# Patient Record
Sex: Male | Born: 1950 | Race: White | Hispanic: No | Marital: Single | Smoking: Never smoker
Health system: Southern US, Community
[De-identification: ages and names within clinical notes are randomized; demographics above are authoritative.]

## PROBLEM LIST (undated history)

## (undated) DIAGNOSIS — E785 Hyperlipidemia, unspecified: Secondary | ICD-10-CM

## (undated) DIAGNOSIS — Z8489 Family history of other specified conditions: Secondary | ICD-10-CM

## (undated) DIAGNOSIS — K5790 Diverticulosis of intestine, part unspecified, without perforation or abscess without bleeding: Secondary | ICD-10-CM

## (undated) DIAGNOSIS — I10 Essential (primary) hypertension: Secondary | ICD-10-CM

## (undated) DIAGNOSIS — D649 Anemia, unspecified: Secondary | ICD-10-CM

## (undated) DIAGNOSIS — E876 Hypokalemia: Secondary | ICD-10-CM

## (undated) DIAGNOSIS — K644 Residual hemorrhoidal skin tags: Secondary | ICD-10-CM

## (undated) DIAGNOSIS — D126 Benign neoplasm of colon, unspecified: Secondary | ICD-10-CM

## (undated) DIAGNOSIS — H53009 Unspecified amblyopia, unspecified eye: Secondary | ICD-10-CM

## (undated) DIAGNOSIS — M1990 Unspecified osteoarthritis, unspecified site: Secondary | ICD-10-CM

## (undated) DIAGNOSIS — I2510 Atherosclerotic heart disease of native coronary artery without angina pectoris: Secondary | ICD-10-CM

## (undated) DIAGNOSIS — R7989 Other specified abnormal findings of blood chemistry: Secondary | ICD-10-CM

## (undated) DIAGNOSIS — K449 Diaphragmatic hernia without obstruction or gangrene: Secondary | ICD-10-CM

## (undated) DIAGNOSIS — E119 Type 2 diabetes mellitus without complications: Secondary | ICD-10-CM

## (undated) DIAGNOSIS — K222 Esophageal obstruction: Secondary | ICD-10-CM

## (undated) DIAGNOSIS — K219 Gastro-esophageal reflux disease without esophagitis: Secondary | ICD-10-CM

## (undated) DIAGNOSIS — R945 Abnormal results of liver function studies: Secondary | ICD-10-CM

## (undated) HISTORY — DX: Diaphragmatic hernia without obstruction or gangrene: K44.9

## (undated) HISTORY — DX: Hypokalemia: E87.6

## (undated) HISTORY — DX: Gastro-esophageal reflux disease without esophagitis: K21.9

## (undated) HISTORY — DX: Type 2 diabetes mellitus without complications: E11.9

## (undated) HISTORY — DX: Other specified abnormal findings of blood chemistry: R79.89

## (undated) HISTORY — DX: Residual hemorrhoidal skin tags: K64.4

## (undated) HISTORY — DX: Hyperlipidemia, unspecified: E78.5

## (undated) HISTORY — DX: Esophageal obstruction: K22.2

## (undated) HISTORY — DX: Abnormal results of liver function studies: R94.5

## (undated) HISTORY — DX: Essential (primary) hypertension: I10

## (undated) HISTORY — PX: EXCISIONAL HEMORRHOIDECTOMY: SHX1541

## (undated) HISTORY — PX: CARDIAC CATHETERIZATION: SHX172

## (undated) HISTORY — PX: BACK SURGERY: SHX140

## (undated) HISTORY — DX: Atherosclerotic heart disease of native coronary artery without angina pectoris: I25.10

## (undated) HISTORY — DX: Unspecified amblyopia, unspecified eye: H53.009

## (undated) HISTORY — DX: Diverticulosis of intestine, part unspecified, without perforation or abscess without bleeding: K57.90

## (undated) HISTORY — DX: Benign neoplasm of colon, unspecified: D12.6

---

## 2000-01-04 DIAGNOSIS — K449 Diaphragmatic hernia without obstruction or gangrene: Secondary | ICD-10-CM

## 2000-01-04 HISTORY — DX: Diaphragmatic hernia without obstruction or gangrene: K44.9

## 2000-01-18 ENCOUNTER — Ambulatory Visit (HOSPITAL_COMMUNITY): Admission: RE | Admit: 2000-01-18 | Discharge: 2000-01-18 | Payer: Self-pay | Admitting: Gastroenterology

## 2000-01-18 ENCOUNTER — Encounter: Payer: Self-pay | Admitting: Gastroenterology

## 2001-06-27 DIAGNOSIS — K222 Esophageal obstruction: Secondary | ICD-10-CM

## 2001-06-27 HISTORY — PX: ESOPHAGEAL DILATION: SHX303

## 2001-06-27 HISTORY — PX: ESOPHAGOGASTRODUODENOSCOPY: SHX1529

## 2001-06-27 HISTORY — DX: Esophageal obstruction: K22.2

## 2002-07-11 ENCOUNTER — Encounter: Payer: Self-pay | Admitting: Internal Medicine

## 2002-07-11 ENCOUNTER — Encounter: Admission: RE | Admit: 2002-07-11 | Discharge: 2002-07-11 | Payer: Self-pay | Admitting: Internal Medicine

## 2004-06-27 HISTORY — PX: COLONOSCOPY: SHX174

## 2004-07-21 ENCOUNTER — Ambulatory Visit: Payer: Self-pay | Admitting: Internal Medicine

## 2004-08-10 DIAGNOSIS — D126 Benign neoplasm of colon, unspecified: Secondary | ICD-10-CM

## 2004-08-10 HISTORY — DX: Benign neoplasm of colon, unspecified: D12.6

## 2004-08-27 ENCOUNTER — Ambulatory Visit: Payer: Self-pay | Admitting: Gastroenterology

## 2004-09-07 ENCOUNTER — Ambulatory Visit: Payer: Self-pay | Admitting: Gastroenterology

## 2005-09-16 ENCOUNTER — Ambulatory Visit: Payer: Self-pay | Admitting: Internal Medicine

## 2005-09-23 ENCOUNTER — Ambulatory Visit: Payer: Self-pay | Admitting: Internal Medicine

## 2005-10-07 ENCOUNTER — Ambulatory Visit: Payer: Self-pay | Admitting: Internal Medicine

## 2005-10-13 ENCOUNTER — Ambulatory Visit (HOSPITAL_COMMUNITY): Admission: RE | Admit: 2005-10-13 | Discharge: 2005-10-13 | Payer: Self-pay | Admitting: Cardiovascular Disease

## 2005-10-19 ENCOUNTER — Ambulatory Visit: Payer: Self-pay | Admitting: Internal Medicine

## 2005-11-25 ENCOUNTER — Ambulatory Visit: Payer: Self-pay | Admitting: Internal Medicine

## 2006-04-11 ENCOUNTER — Ambulatory Visit: Payer: Self-pay | Admitting: Internal Medicine

## 2006-04-11 LAB — CONVERTED CEMR LAB
Chol/HDL Ratio, serum: 4.2
Cholesterol: 142 mg/dL (ref 0–200)
HDL: 34 mg/dL — ABNORMAL LOW (ref >39.0)
Hgb A1c MFr Bld: 6 % (ref 4.6–6.0)
LDL Cholesterol: 92 mg/dL (ref 0–99)
Triglyceride fasting, serum: 82 mg/dL (ref 0–149)
VLDL: 16 mg/dL (ref 0–40)

## 2006-04-18 ENCOUNTER — Ambulatory Visit: Payer: Self-pay | Admitting: Internal Medicine

## 2006-05-02 ENCOUNTER — Ambulatory Visit: Payer: Self-pay | Admitting: Internal Medicine

## 2006-07-11 ENCOUNTER — Ambulatory Visit: Payer: Self-pay | Admitting: Internal Medicine

## 2006-09-12 ENCOUNTER — Ambulatory Visit: Payer: Self-pay | Admitting: Internal Medicine

## 2006-09-12 LAB — CONVERTED CEMR LAB
ALT: 24 units/L (ref 0–40)
AST: 18 units/L (ref 0–37)
Albumin: 3.8 g/dL (ref 3.5–5.2)
Alkaline Phosphatase: 60 units/L (ref 39–117)
BUN: 13 mg/dL (ref 6–23)
Basophils Absolute: 0.1 10*3/uL (ref 0.0–0.1)
Basophils Relative: 0.7 % (ref 0.0–1.0)
Bilirubin Urine: NEGATIVE
Bilirubin, Direct: 0.1 mg/dL (ref 0.0–0.3)
CO2: 28 meq/L (ref 19–32)
Calcium: 9.2 mg/dL (ref 8.4–10.5)
Chloride: 106 meq/L (ref 96–112)
Cholesterol: 175 mg/dL (ref 0–200)
Creatinine, Ser: 1 mg/dL (ref 0.4–1.5)
Creatinine,U: 212.6 mg/dL
Eosinophils Absolute: 0.5 10*3/uL (ref 0.0–0.6)
Eosinophils Relative: 6.3 % — ABNORMAL HIGH (ref 0.0–5.0)
GFR calc Af Amer: 99 mL/min
GFR calc non Af Amer: 82 mL/min
Glucose, Bld: 124 mg/dL — ABNORMAL HIGH (ref 70–99)
HCT: 42.4 % (ref 39.0–52.0)
HDL: 40.8 mg/dL (ref >39.0)
Hemoglobin, Urine: NEGATIVE
Hemoglobin: 14.7 g/dL (ref 13.0–17.0)
Hgb A1c MFr Bld: 6.2 % — ABNORMAL HIGH (ref 4.6–6.0)
Ketones, ur: NEGATIVE mg/dL
LDL Cholesterol: 111 mg/dL — ABNORMAL HIGH (ref 0–99)
Leukocytes, UA: NEGATIVE
Lymphocytes Relative: 23.8 % (ref 12.0–46.0)
MCHC: 34.7 g/dL (ref 30.0–36.0)
MCV: 96.3 fL (ref 78.0–100.0)
Microalb Creat Ratio: 3.8 mg/g (ref 0.0–30.0)
Microalb, Ur: 0.8 mg/dL (ref 0.0–1.9)
Monocytes Absolute: 0.5 10*3/uL (ref 0.2–0.7)
Monocytes Relative: 7.1 % (ref 3.0–11.0)
Neutro Abs: 4.5 10*3/uL (ref 1.4–7.7)
Neutrophils Relative %: 62.1 % (ref 43.0–77.0)
Nitrite: NEGATIVE
PSA: 1.1 ng/mL (ref 0.10–4.00)
Platelets: 249 10*3/uL (ref 150–400)
Potassium: 3.8 meq/L (ref 3.5–5.1)
RBC: 4.4 M/uL (ref 4.22–5.81)
RDW: 12.2 % (ref 11.5–14.6)
Sodium: 139 meq/L (ref 135–145)
Specific Gravity, Urine: >=1.03 (ref 1.000–1.03)
TSH: 1.2 microintl units/mL (ref 0.35–5.50)
Total Bilirubin: 0.7 mg/dL (ref 0.3–1.2)
Total CHOL/HDL Ratio: 4.3
Total Protein, Urine: NEGATIVE mg/dL
Total Protein: 6.8 g/dL (ref 6.0–8.3)
Triglycerides: 115 mg/dL (ref 0–149)
Urine Glucose: NEGATIVE mg/dL
Urobilinogen, UA: 0.2 (ref 0.0–1.0)
VLDL: 23 mg/dL (ref 0–40)
WBC: 7.4 10*3/uL (ref 4.5–10.5)
pH: 6 (ref 5.0–8.0)

## 2006-09-20 ENCOUNTER — Ambulatory Visit: Payer: Self-pay | Admitting: Internal Medicine

## 2006-12-06 DIAGNOSIS — K219 Gastro-esophageal reflux disease without esophagitis: Secondary | ICD-10-CM | POA: Insufficient documentation

## 2006-12-06 DIAGNOSIS — H53009 Unspecified amblyopia, unspecified eye: Secondary | ICD-10-CM | POA: Insufficient documentation

## 2007-01-09 ENCOUNTER — Ambulatory Visit: Payer: Self-pay | Admitting: Internal Medicine

## 2007-01-18 ENCOUNTER — Encounter (INDEPENDENT_AMBULATORY_CARE_PROVIDER_SITE_OTHER): Payer: Self-pay | Admitting: *Deleted

## 2007-01-18 LAB — CONVERTED CEMR LAB
ALT: 23 units/L (ref 0–53)
AST: 20 units/L (ref 0–37)
Cholesterol: 152 mg/dL (ref 0–200)
Creatinine,U: 142.9 mg/dL
HDL: 35.5 mg/dL — ABNORMAL LOW (ref >39.0)
Hgb A1c MFr Bld: 6.2 % — ABNORMAL HIGH (ref 4.6–6.0)
LDL Cholesterol: 91 mg/dL (ref 0–99)
Microalb Creat Ratio: 4.2 mg/g (ref 0.0–30.0)
Microalb, Ur: 0.6 mg/dL (ref 0.0–1.9)
Total CHOL/HDL Ratio: 4.3
Triglycerides: 127 mg/dL (ref 0–149)
VLDL: 25 mg/dL (ref 0–40)

## 2007-01-23 ENCOUNTER — Ambulatory Visit: Payer: Self-pay | Admitting: Internal Medicine

## 2007-02-05 ENCOUNTER — Ambulatory Visit: Payer: Self-pay | Admitting: Internal Medicine

## 2007-02-05 LAB — CONVERTED CEMR LAB
BUN: 18 mg/dL (ref 6–23)
Calcium: 8.7 mg/dL (ref 8.4–10.5)
Chloride: 102 meq/L (ref 96–112)
GFR calc non Af Amer: 73 mL/min
Sodium: 138 meq/L (ref 135–145)

## 2007-02-27 ENCOUNTER — Ambulatory Visit: Payer: Self-pay | Admitting: Internal Medicine

## 2007-02-27 DIAGNOSIS — E1159 Type 2 diabetes mellitus with other circulatory complications: Secondary | ICD-10-CM | POA: Insufficient documentation

## 2007-02-27 DIAGNOSIS — E119 Type 2 diabetes mellitus without complications: Secondary | ICD-10-CM | POA: Insufficient documentation

## 2007-02-27 DIAGNOSIS — I152 Hypertension secondary to endocrine disorders: Secondary | ICD-10-CM | POA: Insufficient documentation

## 2007-02-27 DIAGNOSIS — E876 Hypokalemia: Secondary | ICD-10-CM | POA: Insufficient documentation

## 2007-02-27 DIAGNOSIS — I10 Essential (primary) hypertension: Secondary | ICD-10-CM

## 2007-06-01 ENCOUNTER — Ambulatory Visit: Payer: Self-pay | Admitting: Internal Medicine

## 2007-06-04 LAB — CONVERTED CEMR LAB
Creatinine, Ser: 0.9 mg/dL (ref 0.4–1.5)
Hgb A1c MFr Bld: 6.1 % — ABNORMAL HIGH (ref 4.6–6.0)

## 2007-06-08 ENCOUNTER — Ambulatory Visit: Payer: Self-pay | Admitting: Internal Medicine

## 2007-06-08 DIAGNOSIS — E785 Hyperlipidemia, unspecified: Secondary | ICD-10-CM

## 2007-06-08 DIAGNOSIS — E1169 Type 2 diabetes mellitus with other specified complication: Secondary | ICD-10-CM | POA: Insufficient documentation

## 2007-12-05 ENCOUNTER — Ambulatory Visit: Payer: Self-pay | Admitting: Internal Medicine

## 2007-12-10 LAB — CONVERTED CEMR LAB
Alkaline Phosphatase: 63 units/L (ref 39–117)
BUN: 19 mg/dL (ref 6–23)
Bilirubin, Direct: 0.1 mg/dL (ref 0.0–0.3)
Cholesterol: 135 mg/dL (ref 0–200)
Creatinine, Ser: 1 mg/dL (ref 0.4–1.5)
LDL Cholesterol: 88 mg/dL (ref 0–99)
Microalb Creat Ratio: 3.1 mg/g (ref 0.0–30.0)
Total Protein: 7.1 g/dL (ref 6.0–8.3)

## 2007-12-13 ENCOUNTER — Ambulatory Visit: Payer: Self-pay | Admitting: Internal Medicine

## 2007-12-13 DIAGNOSIS — M545 Low back pain, unspecified: Secondary | ICD-10-CM | POA: Insufficient documentation

## 2008-02-08 ENCOUNTER — Telehealth (INDEPENDENT_AMBULATORY_CARE_PROVIDER_SITE_OTHER): Payer: Self-pay | Admitting: *Deleted

## 2008-06-17 ENCOUNTER — Ambulatory Visit: Payer: Self-pay | Admitting: Internal Medicine

## 2008-08-27 ENCOUNTER — Telehealth (INDEPENDENT_AMBULATORY_CARE_PROVIDER_SITE_OTHER): Payer: Self-pay | Admitting: *Deleted

## 2008-10-31 ENCOUNTER — Encounter: Payer: Self-pay | Admitting: Internal Medicine

## 2008-11-28 ENCOUNTER — Telehealth (INDEPENDENT_AMBULATORY_CARE_PROVIDER_SITE_OTHER): Payer: Self-pay | Admitting: *Deleted

## 2008-12-18 ENCOUNTER — Ambulatory Visit: Payer: Self-pay | Admitting: Internal Medicine

## 2008-12-31 ENCOUNTER — Encounter (INDEPENDENT_AMBULATORY_CARE_PROVIDER_SITE_OTHER): Payer: Self-pay | Admitting: *Deleted

## 2009-01-16 ENCOUNTER — Encounter: Payer: Self-pay | Admitting: Internal Medicine

## 2009-03-27 ENCOUNTER — Telehealth (INDEPENDENT_AMBULATORY_CARE_PROVIDER_SITE_OTHER): Payer: Self-pay | Admitting: *Deleted

## 2009-04-03 ENCOUNTER — Telehealth (INDEPENDENT_AMBULATORY_CARE_PROVIDER_SITE_OTHER): Payer: Self-pay | Admitting: *Deleted

## 2009-04-06 ENCOUNTER — Telehealth (INDEPENDENT_AMBULATORY_CARE_PROVIDER_SITE_OTHER): Payer: Self-pay | Admitting: *Deleted

## 2009-04-06 ENCOUNTER — Encounter: Payer: Self-pay | Admitting: Internal Medicine

## 2009-06-23 ENCOUNTER — Ambulatory Visit: Payer: Self-pay | Admitting: Internal Medicine

## 2009-06-24 ENCOUNTER — Encounter (INDEPENDENT_AMBULATORY_CARE_PROVIDER_SITE_OTHER): Payer: Self-pay | Admitting: *Deleted

## 2009-08-12 ENCOUNTER — Encounter (INDEPENDENT_AMBULATORY_CARE_PROVIDER_SITE_OTHER): Payer: Self-pay | Admitting: *Deleted

## 2009-11-25 DIAGNOSIS — I2510 Atherosclerotic heart disease of native coronary artery without angina pectoris: Secondary | ICD-10-CM | POA: Insufficient documentation

## 2009-11-26 ENCOUNTER — Ambulatory Visit: Payer: Self-pay | Admitting: Internal Medicine

## 2009-12-25 HISTORY — PX: POSTERIOR LAMINECTOMY / DECOMPRESSION LUMBAR SPINE: SUR740

## 2010-01-05 ENCOUNTER — Telehealth: Payer: Self-pay | Admitting: Internal Medicine

## 2010-01-06 ENCOUNTER — Encounter: Payer: Self-pay | Admitting: Internal Medicine

## 2010-01-06 ENCOUNTER — Telehealth: Payer: Self-pay | Admitting: Internal Medicine

## 2010-01-08 ENCOUNTER — Encounter: Payer: Self-pay | Admitting: Internal Medicine

## 2010-01-20 ENCOUNTER — Observation Stay (HOSPITAL_COMMUNITY): Admission: RE | Admit: 2010-01-20 | Discharge: 2010-01-21 | Payer: Self-pay | Admitting: Neurosurgery

## 2010-02-15 ENCOUNTER — Encounter: Payer: Self-pay | Admitting: Internal Medicine

## 2010-04-01 ENCOUNTER — Telehealth (INDEPENDENT_AMBULATORY_CARE_PROVIDER_SITE_OTHER): Payer: Self-pay | Admitting: *Deleted

## 2010-04-26 ENCOUNTER — Encounter: Payer: Self-pay | Admitting: Internal Medicine

## 2010-06-27 DIAGNOSIS — K644 Residual hemorrhoidal skin tags: Secondary | ICD-10-CM

## 2010-06-27 HISTORY — PX: COLONOSCOPY: SHX174

## 2010-06-27 HISTORY — DX: Residual hemorrhoidal skin tags: K64.4

## 2010-07-25 LAB — CONVERTED CEMR LAB
Albumin: 4.4 g/dL (ref 3.5–5.2)
Basophils Absolute: 0 10*3/uL (ref 0.0–0.1)
CO2: 30 meq/L (ref 19–32)
Calcium: 9.8 mg/dL (ref 8.4–10.5)
Chloride: 102 meq/L (ref 96–112)
Creatinine,U: 118.1 mg/dL
Eosinophils Absolute: 0.2 10*3/uL (ref 0.0–0.7)
Glucose, Bld: 108 mg/dL — ABNORMAL HIGH (ref 70–99)
HCT: 45.5 % (ref 39.0–52.0)
HDL: 39.1 mg/dL (ref >39.00)
Hemoglobin: 15.7 g/dL (ref 13.0–17.0)
Lymphs Abs: 1.6 10*3/uL (ref 0.7–4.0)
MCHC: 34.5 g/dL (ref 30.0–36.0)
MCV: 98.2 fL (ref 78.0–100.0)
Microalb, Ur: 0.4 mg/dL (ref 0.0–1.9)
Monocytes Absolute: 0.5 10*3/uL (ref 0.1–1.0)
Neutro Abs: 5.8 10*3/uL (ref 1.4–7.7)
Potassium: 4.4 meq/L (ref 3.5–5.1)
RDW: 13 % (ref 11.5–14.6)
Sodium: 141 meq/L (ref 135–145)
TSH: 1.22 microintl units/mL (ref 0.35–5.50)
Total Protein: 7.5 g/dL (ref 6.0–8.3)

## 2010-07-27 NOTE — Letter (Signed)
Summary: Colonoscopy Letter  [facility]  [address] [place], [place] [address]   Phone: [phone]  Fax: [phone]      August 12, 2009 MRN: [id number]   Khan Degregory [address] [place], [place]  [address]   Dear Mr. Traister,   According to your medical record, it is time for you to schedule a Colonoscopy. The American Cancer Society recommends this procedure as a method to detect early colon cancer. Patients with a family history of colon cancer, or a personal history of colon polyps or inflammatory bowel disease are at increased risk.  This letter has been generated based on the recommendations made at the time of your procedure. If you feel that in your particular situation this may no longer apply, please contact our office.  Please call our office at [phone] to schedule this appointment or to update your records at your earliest convenience.  Thank you for cooperating with [place] to provide you with the very best care possible.   Sincerely,  Vania Rea. Jarold Motto, M.D.  [facility] Gastroenterology Division [phone]

## 2010-07-27 NOTE — Letter (Signed)
Summary: [facility]  [facility]   Imported By: Lanelle Bal 03/02/2010 10:38:01  _____________________________________________________________________  External Attachment:    Type:   Image     Comment:   External Document

## 2010-07-27 NOTE — Letter (Signed)
Summary: [facility]   [facility]   Imported By: Roderic Ovens 04/09/2010 12:12:41  _____________________________________________________________________  External Attachment:    Type:   Image     Comment:   External Document

## 2010-07-27 NOTE — Letter (Signed)
Summary: [facility]  [facility]   Imported By: Lennie Odor 01/21/2010 11:45:01  _____________________________________________________________________  External Attachment:    Type:   Image     Comment:   External Document

## 2010-07-27 NOTE — Letter (Signed)
Summary: [facility] Office Note   [facility] Office Note   Imported By: Roderic Ovens 02/19/2010 11:06:09  _____________________________________________________________________  External Attachment:    Type:   Image     Comment:   External Document

## 2010-07-27 NOTE — Letter (Signed)
Summary: [facility] Office Note    [facility] Office Note    Imported By: Roderic Ovens 06/03/2010 16:06:26  _____________________________________________________________________  External Attachment:    Type:   Image     Comment:   External Document

## 2010-07-27 NOTE — Progress Notes (Signed)
Summary: Schedule Office Visit to Discuss Colonoscopy   Phone Note Outgoing Call Call back at [facility] Phone [phone]   Call placed by: Harlow Mares CMA Duncan Dull),  April 01, 2010 4:46 PM Call placed to: Patient Summary of Call: Left a message on patients machine to call back. patient is due for a colonoscopy recall but is on Plavix and will need an office visit to discuss. Patient has not been seen in our office since Dr. Corinda Gubler retired Dr. Jarold Motto reviewed his chart but when scheduling patient can see anyone he wishes.  Initial call taken by: Harlow Mares CMA Duncan Dull),  April 01, 2010 4:50 PM     Appended Document: Schedule Office Visit to Discuss Colonoscopy Left a message on the patient machine to call back and schedule a previsit and procedure with our office. A letter will be mailed to the patient.

## 2010-07-27 NOTE — Progress Notes (Signed)
Summary: Need to stop Plavix due to back surgery/pt gave new call back #   Phone Note Call from Patient Call back at Home Phone [phone]   Caller: Patient Summary of Call: Pt having back surgery and need the okay to stop Plavix and need clearance fax to Dr.Nudelman [phone] Initial call taken by: Judie Grieve,  January 06, 2010 3:13 PM  Follow-up for Phone Call        Left message to call back Meredith Staggers, RN  January 06, 2010 4:16 PM  Herbert Seta pt returning call [phone] Judie Grieve  January 08, 2010 8:18 AM  needs clearance to be off Plavix for 2 weeks and ok for general anesthesia, will send to Dr Gala Romney for review and call next week Meredith Staggers, RN  January 08, 2010 9:01 AM   Additional Follow-up for Phone Call Additional follow up Details #1::        please call patient at this number [phone] Omer Jack  January 11, 2010 8:57 AM     Additional Follow-up for Phone Call Additional follow up Details #2::    Ok to stop Plavix for surgery. Restart after surgery when OK with surgeons. Dolores Patty, MD, [facility]  January 11, 2010 1:47 PM  pt is aware, note faxed to Dr Jerolyn Shin, RN  January 11, 2010 1:54 PM

## 2010-07-27 NOTE — Assessment & Plan Note (Signed)
Summary: f32m   Primary Provider:  Marga Melnick MD  CC:  ROV; No complaints.  History of Present Illness:  [name] [name] is a delightful 60 year old male with the history of nonobstructive coronary artery disease by cardiac CT with a 50% LAD lesion in 2007.  He also has a history of hypertension, hyperlipidemia, and diabetes.  He has been maintained on Plavix due to ASPIRIN allergy who presents today for routine followup.   Returns for 2 year f/u doing well. Reasonably active with work around [employer]. No CP or SOB. Compliant with all meds. Hasn't seen Dr. Alwyn Ren yet this year.   Current Medications (verified): 1)  Plavix 75 Mg Tabs (Clopidogrel Bisulfate) .... Once Daily 2)  Amlodipine Besylate 10 Mg Tabs (Amlodipine Besylate) .... Once Daily 3)  Metformin Hcl 500 Mg  Tb24 (Metformin Hcl) .Marland Kitchen.. 1 By Mouth Qd 4)  Klor-Con M20 20 Meq  Tbcr (Potassium Chloride Crys Cr) .Marland Kitchen.. 1 By Mouth Qd 5)  Simvastatin 40 Mg  Tabs (Simvastatin) .Marland Kitchen.. 1 By Mouth Qd 6)  Avapro 300 Mg Tabs (Irbesartan) .Marland Kitchen.. 1 By Mouth Once Daily 7)  Hydrochlorothiazide 12.5 Mg Caps (Hydrochlorothiazide) .Marland Kitchen.. 1 By Mouth Once Daily  Allergies: 1)  ! Asa  Past History:  Past Medical History: GERD, Esophageal Stricture ,PMH of Hypertension hypopatassemia, PMH of  Hyperlipidemia diabetes type 2 amblyopia LBS , Dr Burnett Corrente, Chiropractor CAD   --cardiac CT with a 50% LAD lesion in 2007  Review of Systems       As per HPI and past medical history; otherwise all systems negative.   Vital Signs:  Patient profile:   60 year old male Height:      70 inches Weight:      198 pounds BMI:     28.51 Pulse rate:   53 / minute Pulse rhythm:   regular BP sitting:   118 / 80  (left arm) Cuff size:   regular  Vitals Entered By: Stanton Kidney, EMT-P (November 26, 2009 8:03 AM)  Physical Exam  General:  Gen: well appearing. no resp difficulty HEENT: normal Neck: supple. no JVD. Carotids 2+ bilat; no bruits. No lymphadenopathy or  thryomegaly appreciated. Cor: PMI nondisplaced. Bradycardic regular. No rubs, gallops, murmur. Lungs: clear Abdomen: soft, nontender, nondistended. No hepatosplenomegaly. No bruits or masses. Good bowel sounds. Extremities: no cyanosis, clubbing, rash, edema Neuro: alert & orientedx3, cranial nerves grossly intact. moves all 4 extremities w/o difficulty. affect pleasant    Impression & Recommendations:  Problem # 1:  CAD, NATIVE VESSEL (ICD-414.01) Non-obstructive by CT 4 years ago. Suggested f/u ETT at some point. He would like to wait until next year. Continue current meds. F/u with Dr. Alwyn Ren to get lipids checked. Goal LDL < 70. Continue statin.  Problem # 2:  HYPERTENSION, ESSENTIAL NOS (ICD-401.9) Blood pressure well controlled. Given DM2 consider addition of ACE-I as BP tolerates. Will leave to discretion of Dr. Alwyn Ren.  Other Orders: EKG w/ Interpretation (93000)  Patient Instructions: 1)  Your physician wants you to follow-up in:  1year.  You will receive a reminder letter in the mail two months in advance. If you don't receive a letter, please call our office to schedule the follow-up appointment. 2)  Your physician has requested that you have an exercise tolerance test.  For further information please visit [email or web address]  Please also follow instruction sheet, as given.  Needs test in 1 year  Appended Document: f59m    Clinical Lists Changes  Medications: Rx of  PLAVIX 75 MG TABS (CLOPIDOGREL BISULFATE) once daily;  #90 x 3;  Signed;  Entered by: Meredith Staggers, RN;  Authorized by: Dolores Patty, MD, [facility];  Method used: Electronically to [facility]. #[id number]*, [address] [place], [place], [place]  [address], Ph: [phone], Fax: [phone] Rx of AMLODIPINE BESYLATE 10 MG TABS (AMLODIPINE BESYLATE) once daily;  #90 x 3;  Signed;  Entered by: Meredith Staggers, RN;  Authorized by: Dolores Patty, MD, [facility];  Method used: Electronically to [facility]*, [address] [place], [place], [place]  [address], Ph: [phone], Fax: [phone] Rx of KLOR-CON M20 20 MEQ  TBCR (POTASSIUM CHLORIDE CRYS CR) 1 by mouth qd;  #90 Each x 3;  Signed;  Entered by: Meredith Staggers, RN;  Authorized by: Dolores Patty, MD, [facility];  Method used: Electronically to [facility]*, [address] [place], [place], [place]  [address], Ph: [phone], Fax: [phone] Rx of SIMVASTATIN 40 MG  TABS (SIMVASTATIN) 1 by mouth qd;  #90 x 3;  Signed;  Entered by: Meredith Staggers, RN;  Authorized by: Dolores Patty, MD, [facility];  Method used: Electronically to [facility]*, [address] [place], [place], [place]  [address], Ph: [phone], Fax: [phone] Rx of AVAPRO 300 MG TABS (IRBESARTAN) 1 by mouth once daily;  #90 x 3;  Signed;  Entered by: Meredith Staggers, RN;  Authorized by: Dolores Patty, MD, [facility];  Method used: Electronically to [facility]*, [address] [name], [place], [place]  [address], Ph: [phone], Fax: [phone] Rx of HYDROCHLOROTHIAZIDE 12.5 MG CAPS (HYDROCHLOROTHIAZIDE) 1 by mouth once daily;  #90 Each x 3;  Signed;  Entered by: Meredith Staggers, RN;  Authorized by: Dolores Patty, MD, [facility];  Method used: Electronically to [facility]*, [address] [place], [place], [place]  [address], Ph: [phone], Fax: [phone]    Prescriptions: HYDROCHLOROTHIAZIDE 12.5 MG CAPS (HYDROCHLOROTHIAZIDE) 1 by mouth once daily  #90 Each x 3   Entered by:   Meredith Staggers, RN   Authorized by:   Dolores Patty, MD, [facility]   Signed by:   Meredith Staggers, RN on 11/26/2009   Method used:   Electronically to        [facility]* (retail)       [address] [place], [place]  [address]       Ph: [phone]       Fax: [phone]   RxID:   [id number] AVAPRO 300 MG TABS (IRBESARTAN) 1 by mouth once daily  #90 x 3   Entered by:   Meredith Staggers, RN    Authorized by:   Dolores Patty, MD, [facility]   Signed by:   Meredith Staggers, RN on 11/26/2009   Method used:   Electronically to        [facility]* (retail)       [address] [place], [place]  [address]       Ph: [phone]       Fax: [phone]   RxID:   [id number] SIMVASTATIN 40 MG  TABS (SIMVASTATIN) 1 by mouth qd  #90 x 3   Entered by:   Meredith Staggers, RN   Authorized by:   Dolores Patty, MD, [facility]   Signed by:  Meredith Staggers, RN on 11/26/2009   Method used:   Electronically to        [facility]* (retail)       [address] [place], [place]  [address]       Ph: [phone]       Fax: [phone]   RxID:   [id number] KLOR-CON M20 20 MEQ  TBCR (POTASSIUM CHLORIDE CRYS CR) 1 by mouth qd  #90 Each x 3   Entered by:   Meredith Staggers, RN   Authorized by:   Dolores Patty, MD, [facility]   Signed by:   Meredith Staggers, RN on 11/26/2009   Method used:   Electronically to        [facility]* (retail)       [address] [place], [place]  [address]       Ph: [phone]       Fax: [phone]   RxID:   [id number] AMLODIPINE BESYLATE 10 MG TABS (AMLODIPINE BESYLATE) once daily  #90 x 3   Entered by:   Meredith Staggers, RN   Authorized by:   Dolores Patty, MD, [facility]   Signed by:   Meredith Staggers, RN on 11/26/2009   Method used:   Electronically to        [facility]* (retail)       [address] [place], [place]  [address]       Ph: [phone]       Fax: [phone]   RxID:   [id number] PLAVIX 75 MG TABS (CLOPIDOGREL BISULFATE) once daily  #90 x 3   Entered by:   Meredith Staggers, RN   Authorized by:   Dolores Patty, MD, [facility]   Signed by:   Meredith Staggers, RN on 11/26/2009   Method used:   Electronically to        [facility]. [phone]* (retail)       [address]       [place], [place]  [address]       Ph: [phone]       Fax: [phone]   RxID:   [id number]

## 2010-07-27 NOTE — Letter (Signed)
Summary: [facility]  [facility]   Imported By: Lanelle Bal 05/19/2010 14:18:22  _____________________________________________________________________  External Attachment:    Type:   Image     Comment:   External Document

## 2010-07-27 NOTE — Progress Notes (Signed)
Summary: Referral request  Phone Note Call from Patient Call back at Work Phone [phone]   Summary of Call: Patient left message on triage stating that he is having pain and nerve trouble with his left leg. Patient notes that he has to walk with a limp and has been seeing a chiropractor, but it has not helped. Patient is requesting referral to Dr. Bettina Gavia (neuro). Does the patient need an office visit first? Please advise. Initial call taken by: Lucious Groves,  January 05, 2010 9:07 AM  Follow-up for Phone Call        Usually Neurosurgery will not see him w/o evaluation & MRI. Have him call & see if that is case & whether Dr Newell Coral is on his plan. If they won't see him; please see me for evaluation Follow-up by: Marga Melnick MD,  January 05, 2010 1:27 PM  Additional Follow-up for Phone Call Additional follow up Details #1::        Patient notified and states that he has verified that they are on his plan.  Patient will speak with their office and his chiropractor and call back for eval. if needed. Additional Follow-up by: Lucious Groves,  January 05, 2010 2:23 PM

## 2010-07-27 NOTE — Consult Note (Signed)
Summary: [facility] Consult   [facility] Consult   Imported By: Roderic Ovens 02/19/2010 11:07:03  _____________________________________________________________________  External Attachment:    Type:   Image     Comment:   External Document

## 2010-07-27 NOTE — Letter (Signed)
Summary: [facility]  [facility]   Imported By: Lanelle Bal 01/21/2010 12:22:08  _____________________________________________________________________  External Attachment:    Type:   Image     Comment:   External Document

## 2010-09-11 LAB — BASIC METABOLIC PANEL
BUN: 17 mg/dL (ref 6–23)
CO2: 27 mEq/L (ref 19–32)
Calcium: 9.9 mg/dL (ref 8.4–10.5)
Chloride: 104 mEq/L (ref 96–112)
Creatinine, Ser: 1.02 mg/dL (ref 0.4–1.5)
GFR calc Af Amer: >60 mL/min (ref >60)
GFR calc non Af Amer: >60 mL/min (ref >60)
Glucose, Bld: 112 mg/dL — ABNORMAL HIGH (ref 70–99)
Potassium: 4 mEq/L (ref 3.5–5.1)
Sodium: 139 mEq/L (ref 135–145)

## 2010-09-11 LAB — GLUCOSE, CAPILLARY
Glucose-Capillary: 112 mg/dL — ABNORMAL HIGH (ref 70–99)
Glucose-Capillary: 122 mg/dL — ABNORMAL HIGH (ref 70–99)
Glucose-Capillary: 125 mg/dL — ABNORMAL HIGH (ref 70–99)
Glucose-Capillary: 201 mg/dL — ABNORMAL HIGH (ref 70–99)

## 2010-09-11 LAB — CBC
HCT: 45.9 % (ref 39.0–52.0)
Hemoglobin: 15.4 g/dL (ref 13.0–17.0)
MCH: 32.9 pg (ref 26.0–34.0)
MCHC: 33.6 g/dL (ref 30.0–36.0)
MCV: 97.9 fL (ref 78.0–100.0)
Platelets: 226 10*3/uL (ref 150–400)
RBC: 4.69 MIL/uL (ref 4.22–5.81)
RDW: 13.3 % (ref 11.5–15.5)
WBC: 9.8 10*3/uL (ref 4.0–10.5)

## 2010-09-11 LAB — SURGICAL PCR SCREEN
MRSA, PCR: NEGATIVE
Staphylococcus aureus: NEGATIVE

## 2010-10-07 ENCOUNTER — Other Ambulatory Visit: Payer: Self-pay | Admitting: Internal Medicine

## 2010-10-07 NOTE — Telephone Encounter (Signed)
Patient is DUE for A1c 250.00

## 2010-11-09 NOTE — Assessment & Plan Note (Signed)
[facility]                            CARDIOLOGY OFFICE NOTE   NAME:[name] [name], [name] [name]                       MRN:          [id number]  DATE:06/17/2008                            DOB:          [birth year]    PRIMARY CARE PHYSICIAN:  [name] [name]. [name] Ren, MD, FACP, [facility]   INTERVAL HISTORY:  [name] [name] is a delightful 60 year old male with the  history of nonobstructive coronary artery disease by cardiac CT with a  50% LAD lesion in 2007.  He also has a history of hypertension,  hyperlipidemia, and diabetes.  He has been maintained on Plavix due to  ASPIRIN allergy who presents today for routine followup.   He is doing quite well.  He is remaining fairly active, although not as  much as he would like.  He denies any chest pain or significant dyspnea.  Blood pressure has been very well controlled.  He said he has been  following his lipids with Dr. Alwyn [name] and they had been doing well except  for his HDL has been low.   CURRENT MEDICATIONS:  Plavix 75 a day, metformin 500 a day, simvastatin  40 a day, Benicar 40 a day, amlodipine 10 a day, potassium 20 a day,  HCTZ 12.5 a day.   ALLERGIES:  ASPIRIN and ALTACE.   PHYSICAL EXAMINATION:  GENERAL:  He is well-appearing, ambulates around  the clinic without any respiratory difficulty.  VITAL SIGNS:  Blood pressure is 122/78, heart rate is 55, weight is 196.  HEENT:  Normal.  NECK:  Supple.  No JVD.  Carotids were 2+ bilaterally without any  bruits.  There is no lymphadenopathy or thyromegaly.  CARDIAC:  PMI is nondisplaced.  Regular rate and rhythm.  No murmurs,  rubs, or gallops.  LUNGS:  Clear.  ABDOMEN:  Soft, nontender, nondistended, mildly obese.  No  hepatosplenomegaly, no bruits, no masses.  Good bowel sounds.  EXTREMITIES:  Warm with no cyanosis, clubbing, or edema.  Good pulses.  No rash.  NEURO:  Alert and oriented x3.  Cranial nerves II through XII are  intact.  Moves all 4 extremities without  difficulty.  Affect is  pleasant.   EKG shows sinus bradycardia with no significant ST-T wave abnormalities.   ASSESSMENT AND PLAN:  1. Coronary artery disease.  This is mild.  Continue risk factor      management.  2. Hypertension, very well-controlled.  3. Hyperlipidemia.  This is followed by Dr. Alwyn [name].  Given his low      HDL, I would start fish oil 2 g a day.  Goal LDL for him is less      than 70.     [name] [name]. [name], MD  Electronically Signed    DRB/MedQ  DD: 06/17/2008  DT: 06/18/2008  Job #: [id number]

## 2010-11-09 NOTE — Assessment & Plan Note (Signed)
[facility]                            CARDIOLOGY OFFICE NOTE   NAME:[name] [name], [name] [name]                       MRN:          [id number]  DATE:01/23/2007                            DOB:          Nov 14, 1950    PRIMARY CARE PHYSICIAN:  Dr. Lona Kettle.   INTERVAL HISTORY:  [name] is a very pleasant 60 year old male with a  history of nonobstructive coronary artery disease, hypertension,  hyperlipidemia and metabolic syndrome.  He also has an ASPIRIN allergy.  He presents for routine follow-up.   Overall he says he is doing quite well.  He is walking two miles about 3  times a week without any chest pain or shortness of breath.  He has been  compliant with his medications, really no complaints.  Blood pressure  has been running about the 130 range.   CURRENT MEDICATIONS:  1. Avapro 300 mg a day.  2. Plavix 75 mg.  3. Simvastatin 40 mg.  4. Metformin 500 mg.  5. Norvasc 10 mg.   ALLERGIES:  ASPIRIN and ALTACE.   PHYSICAL EXAMINATION:  He is well-appearing.  Ambulates around the  clinic without any respiratory distress.  Blood pressure is 132/82, heart rate 60, weight is 198.  HEENT:  Normal.  NECK:  Supple, no JVD.  Carotids are 2+ bilaterally without bruits.  There is no lymphadenopathy or thyromegaly.  CARDIAC:  Regular rate and rhythm.  No murmurs, rubs or gallops.  PMI is  nondisplaced.  LUNGS:  Clear.  ABDOMEN:  Soft, nontender, nondistended.  He does have some mild central  obesity.  No hepatosplenomegaly, no bruits, no masses.  Good bowel  sounds.  EXTREMITIES:  Warm with no cyanosis, clubbing or edema.  Good distal  pulses.  NEUROLOGIC:  He is alert and oriented x3.  Cranial nerves II-XII are  intact.  He moves all extremities without difficulty.  Affect is bright.   EKG shows normal sinus rhythm at a rate of 60.  No ST-T wave  abnormalities.  Total cholesterol 152, triglycerides 127, HDL 36, LDL  91.  Hemoglobin A1c is 6.2.   ASSESSMENT AND PLAN:  1. Hypertension is borderline controlled.  Will add      hydrochlorothiazide 25 mg in the form of Avalide 300/25 mg.  He      will follow up with Dr. Alwyn Ren.  We will also add potassium 20 mEq      a day and check a BMET in 2 weeks.  2. Hyperlipidemia.  LDL looks good.  HDL is low.  I suggested fish oil      one to two tablets a day.  We could also consider Niaspan.  3. Coronary artery disease.  This is nonobstructive.  He is on a good      medical regimen.  Unfortunately, his bradycardia prevents treating      him with a beta blocker.   DISPOSITION:  Return to clinic in 6 months for routine follow-up.     [name]. [name], MD  Electronically Signed    DRB/MedQ  DD: 01/23/2007  DT: 01/24/2007  Job #: [id number]   cc:   Titus Dubin. Alwyn Ren, MD,FACP,FCCP

## 2010-11-12 NOTE — Assessment & Plan Note (Signed)
[facility]                              CARDIOLOGY OFFICE NOTE   NAME:[name] [name] [name], [name] [name] KITAGAWA                       MRN:          [id number]  DATE:04/18/2006                            DOB:          1950/08/01    PRIMARY CARE PHYSICIAN:  [name] [name] [name] [name] [name].   PATIENT IDENTIFICATION:  [name] [name] [name] [name] [name] is a very pleasant 60 year old male who  returns for routine followup.   PAST MEDICAL HISTORY:  1. Non-obstructive coronary artery disease by a cardiac CT April 2007.      a.     Mid LAD had 50% lesion.  Normal ejection fraction.  2. Atypical chest pain.  Exercise Myoview 2004 negative for ischemia.  3. Metabolic syndrome.  4. Hypertension.  5. Hyperlipidemia.  6. Elevated liver function tests secondary to labetalol.  7. Marked sinus bradycardia requiring discontinuation of beta blocker.  8. ASPIRIN allergy.   CURRENT MEDICATIONS:  1. Avapro 300 mg a day.  2. Centrum multivitamin.  3. Plavix 75.  4. Simvastatin 20.  5. Amlodipine 2.5.   INTERVAL HISTORY:  [name] [name] [name] [name] [name] returns for routine followup.  He is doing quite  well.  He is walking on the treadmill for 40 minutes to an hour 3 to 5 times  a week without any chest pain or shortness of breath.  He has lost about 12  pounds over the past year.  He is also watching his diet.  He has not had  any chest pain or shortness of breath.  Last visit we started him on Norvasc  at 2.5 a day.  He has not been following his blood pressures.   PHYSICAL EXAM:  He is well-appearing in no acute distress.  Ambulates  without any respiratory difficulty.  Blood pressure initially was 164/102.  On followup in the left arm 162/96.  Heart rate is 55.  Weight is 191.  HEENT:  Sclerae anicteric.  EOMI.  There is no xanthelasma.  Mucous  membranes are moist.  NECK:  Supple.  No JVD.  Carotids are 2+ bilaterally without bruits.  There  is no lymphadenopathy or thyromegaly.  CARDIAC:  He is bradycardic and regular with no murmurs, rubs,  or gallops.  LUNGS:  Clear.  ABDOMEN:  Soft, nontender, nondistended.  Mildly obese.  No  hepatosplenomegaly.  No bruits.  No masses.  Good bowel sounds.  EXTREMITIES:  Warm with no cyanosis, clubbing, or edema.  Distal pulses are  strong.  NEURO:  He is alert and oriented x3.  Cranial nerves 2-12 are intact.  Moves  all 4 extremities without difficulty.  Affect is bright.  SKIN:  No rashes.  There are no arthropathies.   EKG:  Sinus bradycardia at a rate of 56 with nonspecific ST-T wave changes.   ASSESSMENT AND PLAN:  1. Coronary artery disease that is stable without any evidence of      ischemia.  Continue current regimen.  2. Hypertension.  This is significantly elevated.  We will increase his      Norvasc to 10.  He will followup with [name] [name] [name] [name] [name].  I suspect he  will      also need the addition of hydrochlorothiazide.  3. Hyperlipidemia.  This is followed by [name] [name] [name] [name] [name] as well.  I have asked      him to send me a copy of his lipids.  Given his coronary artery disease      it would be nice to have his Statin titrated so his LDL is 70 or below.   DISPOSITION:  Return to clinic in 3 months for routine followup.  I did tell  him to call me sooner if his blood pressure is persistently elevated.  He  will keep a log for [place] and let us know.     [name]. [name], MD    DRB/MedQ  DD: 04/18/2006  DT: 04/19/2006  Job #: [id number]   cc:   [name] [name] [name] [name] [name]. [name] Ren, MD,FACP,FCCP

## 2010-11-12 NOTE — Assessment & Plan Note (Signed)
[facility]                        [place] OFFICE NOTE   NAME:[name] [name], [name] [name]                       MRN:          [id number]  DATE:09/20/2006                            DOB:          06-03-1951    [name] [name], date of birth [birth year], was seen for comprehensive  physical examination on September 20, 2006.  He is essentially asymptomatic.  He has a diagnosis of metabolic syndrome.  His blood pressures have  averaged 130-135/80-85 at home.  He denies epistaxis, headache or  cardiopulmonary symptoms.  He has been walking 3 times per week for 45-  60 minutes.  He has not been on a diet.   He denies polyuria, polyphagia or polydipsia.  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  He has no  known non-healing lesions as would be expected with uncontrolled  diabetes.  He denies any paresthesias.   His past medical history reveals esophageal stricture requiring  dilatation in 2003.  Colonoscopy in 2006 revealed diverticulosis and  polyps.  He had a stress Cardiolite in 2004 for abnormal EKG, which  showed no perfusion deficit.  He is followed by Dr. Gala Romney, who  emphasized addressing cardiovascular risk factors.   FAMILY HISTORY:  Positive for breast cancer, stroke, heart attack,  prostate cancer, diabetes.  Grandmother also had esophageal stricture.  Specifically, his mother had a stroke and heart attack, father had  angioplasty, diabetes and prostate cancer.  A brother has diabetes.   He has never smoked and drinks socially.  He had ANGIOEDEMA AND RASH  ASSOCIATED WITH ASPIRIN.   Presently he is on multivitamin, Avapro 300 mg daily, Plavix 75 mg  daily, Simvastatin 20 mg daily and amlodipine 10 mg daily.   The review of systems was completed in toto.  He had recent low back  pain, for which he took anti-inflammatory medications with resolution.   He continues to have recurrent skin lesions, which  are non-pruritic.  He  has had no oral lesions.  He states that the dermatologist diagnosed  folliculitis almost a decade ago.   He is 5 foot 9-1/2 inches tall, weight is 201, up approximately 10  pounds.  Pulse is 68 and regular, respiratory rate 16, blood pressure  112/74.  Positive physical findings include some arteriolar narrowing on  fundal exam.  He has an osteoma of the hard pallet.  The cardiopulmonary  exam was unremarkable, except for some decrease on his dorsalis pedis  pulses.  He has no evidence of ischemia of the feet.  There are no nail  changes of diabetes.   He does have bland, plaque-like lesions, only one of which shows  localized inflammation.  They are suggestive more of a lichen planus; he  has had no oral lesions.   Prostate upper limits of normal; hemoccult testing negative.  He has not  had dysphagia or melena despite the history of esophageal stricture.   EKG reveals sinus bradycardia with no ischemic changes noted.  CBC and  differential was normal except for a mildly elevated eosinophil count at  6.3.  Hepatic profile and BMET were normal except for a glucose of 124.  His A1c is 6.2%, suggesting an average sugar of 142 and a 24% increase  stroke or heart attack risk.  Also negative or normal were TSH, PSA and  urinalysis.  His lipid profile revealed an LDL of 111; it should be less  than 100.   A goal sheet for preventing progression of diabetes was provided.  He  will be placed on Metformin ER 500 mg 1 daily with the largest meal.  I  recommended that he avoid excessive white carbs and high fructose corn  syrup as the first, second or third ingredient on labels.  Simvastatin  will be increased to 40 mg daily with repeat labs in 3 months.   A good basic diet would be [facility] Low Carb.  I also recommended he  reference the Flat Belly Diet in Prevention Magazine, (web site  prevention.com).   Referral to a dermatologist will be pursued .  He will  continue his  monitor by Dr. Gala Romney.     Titus Dubin. Alwyn Ren, MD,FACP,FCCP  Electronically Signed    [facility]/MedQ  DD: 09/20/2006  DT: 09/20/2006  Job #: [id number]

## 2010-11-12 NOTE — Assessment & Plan Note (Signed)
[facility]                            CARDIOLOGY OFFICE NOTE   NAME:Earll, ELLIS MEHAFFEY                       MRN:          [id number]  DATE:07/11/2006                            DOB:          1950-11-30    REFERRING PHYSICIAN:  Titus Dubin. Alwyn Ren, MD,FACP,FCCP   INTERVAL HISTORY:  Mr. Syler is a very pleasant 60 year old male with a  history of nonobstructive coronary artery disease, hypertension,  hyperlipidemia and metabolic syndrome.  He also has an ASPIRIN allergy.  Presents for a routine followup.  He says he is doing quite well.  He is  exercising without any chest pain or shortness of breath.  He is  watching his blood pressure and states that it is much improved.  Average blood pressure is about 120/80.  He has not had any heart  failure symptoms.  He has gained a little bit of weight.   MEDICATIONS:  1. Avapro 300 mg a day.  2. Centrum multivitamin.  3. Plavix 75 a day.  4. Simvastatin 20.  5. Norvasc 10.   ALLERGIES:  1. ASPIRIN.  2. ALTACE.   PHYSICAL EXAMINATION:  He is well appearing.  Ambulates around the  clinic without any respiratory distress.  Blood pressure is 130/90.  Heart rate 60.  Weight is 199.  HEENT:  Sclerae anicteric.  EOMI.  There is no xanthelasma.  Mucous  membranes are moist.  NECK:  Supple.  No JVD.  Carotids 2+ bilaterally without bruits.  There  is no lymphadenopathy or thyromegaly.  CARDIAC:  Regular rate and rhythm.  No murmurs, rubs or gallops.  LUNGS:  Clear.  ABDOMEN:  Soft, non-tender, non-distended.  Mildly obese.  No  hepatosplenomegaly.  No bruits.  No masses.  Good bowel sounds.  EXTREMITIES:  Warm with no cyanosis, clubbing or edema.  Distal pulses  are strong.  NEUROLOGIC:  He is alert and oriented x3.  Cranial nerves 2 through 12  are intact.  Moves all 4 extremities without difficulty.  Affect is  bright.  EKG shows sinus rhythm at a rate of 60.  No ST-T wave abnormalities.   ASSESSMENT AND  PLAN:  1. Hypertension.  Blood pressure is much improved.  We will continue      to monitor.  He will keep a blood pressure log and bring it to [place]      in about 6 weeks.  We will titrate his regimen from there.  Should      it still be a bit elevated at a low threshold, add      hydrochlorothiazide 12.5 mg a day.  2. Hyperlipidemia.  This is followed by Dr. Alwyn Ren.  I have asked him      to give me a copy of his lipids when he gets checked.  3. Coronary artery disease.  This is nonobstructive.  He is on a good      medical regimen.  Unfortunately, his bradycardia prevents having      him on a beta blocker.   DISPOSITION:  Return to clinic in 6 months for routine followup.  [name]. [name], MD  Electronically Signed    DRB/MedQ  DD: 07/11/2006  DT: 07/12/2006  Job #: [id number]

## 2010-12-18 ENCOUNTER — Other Ambulatory Visit: Payer: Self-pay | Admitting: Internal Medicine

## 2011-01-08 ENCOUNTER — Other Ambulatory Visit: Payer: Self-pay | Admitting: Internal Medicine

## 2011-01-14 ENCOUNTER — Other Ambulatory Visit: Payer: Self-pay | Admitting: Internal Medicine

## 2011-01-25 ENCOUNTER — Encounter: Payer: Self-pay | Admitting: Internal Medicine

## 2011-02-02 ENCOUNTER — Ambulatory Visit (INDEPENDENT_AMBULATORY_CARE_PROVIDER_SITE_OTHER): Payer: BC Managed Care – PPO | Admitting: Internal Medicine

## 2011-02-02 ENCOUNTER — Encounter: Payer: Self-pay | Admitting: Internal Medicine

## 2011-02-02 VITALS — BP 111/75 | HR 64 | Resp 16 | Ht 69.0 in | Wt 194.0 lb

## 2011-02-02 DIAGNOSIS — I2510 Atherosclerotic heart disease of native coronary artery without angina pectoris: Secondary | ICD-10-CM

## 2011-02-02 DIAGNOSIS — E785 Hyperlipidemia, unspecified: Secondary | ICD-10-CM

## 2011-02-02 MED ORDER — METFORMIN HCL 500 MG PO TABS: 500.0000 mg | ORAL_TABLET | Freq: Two times a day (BID) | ORAL | Status: DC

## 2011-02-02 NOTE — Progress Notes (Signed)
HPI:  [name] [name] is a delightful 60 year old male with the history of nonobstructive coronary artery disease by cardiac CT with a 50% LAD lesion in 2007.  He also has a history of hypertension, hyperlipidemia, and diabetes.  He has been maintained on Plavix due to ASPIRIN allergy who presents today for routine followup.  Returns for 2 year f/u doing well. Reasonably active with work around [employer]. Walking 2 miles about 3x/week. No CP or SOB. Not checking sugars. Compliant with all meds except that he has run out of metformin. Worried if statin causing memory issues.    ROS: All systems negative except as listed in HPI, PMH and Problem List.  Past Medical History  Diagnosis Date  . GERD (gastroesophageal reflux disease)     esophageal stricture, pmh  . HTN (hypertension)   . HLD (hyperlipidemia)   . DM2 (diabetes mellitus, type 2)   . Amblyopia   . CAD (coronary artery disease)     cardiac CT with a 50% LAD lesion in 2007    Current Outpatient Prescriptions  Medication Sig Dispense Refill  . amLODipine (NORVASC) 10 MG tablet TAKE ONE TABLET BY MOUTH EVERY DAY  90 tablet  3  . AVAPRO 300 MG tablet TAKE ONE TABLET BY MOUTH EVERY DAY  90 each  0  . hydrochlorothiazide (,MICROZIDE/HYDRODIURIL,) 12.5 MG capsule TAKE ONE CAPSULE BY MOUTH EVERY DAY  90 capsule  3  . metFORMIN (GLUCOPHAGE) 500 MG tablet TAKE ONE TABLET BY MOUTH EVERY DAY **LABS  DUE**  30 tablet  0  . PLAVIX 75 MG tablet take 1 tablet by mouth once daily  90 tablet  3  . simvastatin (ZOCOR) 40 MG tablet TAKE ONE TABLET BY MOUTH EVERY DAY  90 tablet  1    PHYSICAL EXAM: Filed Vitals:   02/02/11 1143  BP: 111/75  Pulse: 64  Resp: 16   General:  Well appearing. No resp difficulty HEENT: normal Neck: supple. JVP flat. Carotids 2+ bilaterally; no bruits. No lymphadenopathy or thryomegaly appreciated. Cor: PMI normal. Regular rate & rhythm. No rubs, gallops or murmurs. Lungs: clear Abdomen: soft, nontender,  nondistended. No hepatosplenomegaly. No bruits or masses. Good bowel sounds. Extremities: no cyanosis, clubbing, rash, edema Neuro: alert & orientedx3, cranial nerves grossly intact. Moves all 4 extremities w/o difficulty. Affect pleasant.    ECG: NSR 63 No ST-T wave abnormalities.     ASSESSMENT & PLAN:

## 2011-02-02 NOTE — Patient Instructions (Signed)
Your physician has requested that you have an exercise tolerance test. For further information please visit [email or web address] Please also follow instruction sheet, as given.  Your physician wants you to follow-up in: 2 years.  You will receive a reminder letter in the mail two months in advance. If you don't receive a letter, please call our office to schedule the follow-up appointment.

## 2011-02-02 NOTE — Assessment & Plan Note (Signed)
Goal LDL < 70. Will have lipids/CMET with Dr. Alwyn Ren. Discussed issue of memory impairment with statin but given CAD and DM2 would continue statin.

## 2011-02-02 NOTE — Assessment & Plan Note (Signed)
Non-obstructive. Given that he is not exercising regularly and it has been 5 years since last assessment will screen for worsening CAD with ETT. Counseled him to get more exercise.

## 2011-02-02 NOTE — Progress Notes (Signed)
Addended by: Noralee Space on: 02/02/2011 05:57 PM   Modules accepted: Orders

## 2011-02-22 ENCOUNTER — Other Ambulatory Visit: Payer: Self-pay | Admitting: Internal Medicine

## 2011-02-24 ENCOUNTER — Ambulatory Visit (INDEPENDENT_AMBULATORY_CARE_PROVIDER_SITE_OTHER): Payer: BC Managed Care – PPO | Admitting: Physician Assistant

## 2011-02-24 DIAGNOSIS — I2510 Atherosclerotic heart disease of native coronary artery without angina pectoris: Secondary | ICD-10-CM

## 2011-02-24 NOTE — Progress Notes (Signed)
Exercise Treadmill Test  Pre-Exercise Testing Evaluation Rhythm: sinus bradycardia  Rate: 57   PR:  .15 QRS:  .08  QT:  .42 QTc: .41     Test  Exercise Tolerance Test Ordering MD: Arvilla Meres, MD  Interpreting MD:  Tereso Newcomer, PA-C  Unique Test No: 1  Treadmill:  1  Indication for ETT: known ASHD  Contraindication to ETT: No   Stress Modality: exercise - treadmill  Cardiac Imaging Performed: non   Protocol: standard Bruce - maximal  Max BP:  154/70  Max MPHR (bpm):159 85% MPR (bpm):  135  MPHR obtained (bpm):  139 % MPHR obtained: 88  Reached 85% MPHR (min:sec): 7:28 Total Exercise Time (min-sec):  8:00  Workload in METS:  13.4 Borg Scale: 17  Reason ETT Terminated:  patient's desire to stop    ST Segment Analysis At Rest: Normal ST segments With Exercise: non-specific ST changes  Other Information Arrhythmia:  Run of PVCs at end of exercise Angina during ETT:  absent (0) Quality of ETT:  diagnostic  ETT Interpretation:  normal - no evidence of ischemia by ST analysis  Comments: Good exercise tolerance. Normal BP response to exercise. No chest pain. No ST-T changes to suggest ischemia.  Recommendations: Follow up with Dr. Gala Romney as directed.

## 2011-03-17 ENCOUNTER — Encounter: Payer: Self-pay | Admitting: Internal Medicine

## 2011-03-17 ENCOUNTER — Ambulatory Visit (INDEPENDENT_AMBULATORY_CARE_PROVIDER_SITE_OTHER): Payer: BC Managed Care – PPO | Admitting: Internal Medicine

## 2011-03-17 DIAGNOSIS — I2510 Atherosclerotic heart disease of native coronary artery without angina pectoris: Secondary | ICD-10-CM

## 2011-03-17 DIAGNOSIS — E119 Type 2 diabetes mellitus without complications: Secondary | ICD-10-CM

## 2011-03-17 DIAGNOSIS — I10 Essential (primary) hypertension: Secondary | ICD-10-CM

## 2011-03-17 DIAGNOSIS — E785 Hyperlipidemia, unspecified: Secondary | ICD-10-CM

## 2011-03-17 DIAGNOSIS — D122 Benign neoplasm of ascending colon: Secondary | ICD-10-CM | POA: Insufficient documentation

## 2011-03-17 DIAGNOSIS — M545 Low back pain, unspecified: Secondary | ICD-10-CM

## 2011-03-17 DIAGNOSIS — Z0000 Encounter for general adult medical examination without abnormal findings: Secondary | ICD-10-CM

## 2011-03-17 DIAGNOSIS — D126 Benign neoplasm of colon, unspecified: Secondary | ICD-10-CM

## 2011-03-17 LAB — HEPATIC FUNCTION PANEL
Albumin: 4.4 g/dL (ref 3.5–5.2)
Alkaline Phosphatase: 79 U/L (ref 39–117)
Total Protein: 7.4 g/dL (ref 6.0–8.3)

## 2011-03-17 LAB — BASIC METABOLIC PANEL
Calcium: 9.4 mg/dL (ref 8.4–10.5)
Creatinine, Ser: 0.8 mg/dL (ref 0.4–1.5)
GFR: 100.06 mL/min (ref >60.00)

## 2011-03-17 LAB — CBC WITH DIFFERENTIAL/PLATELET
Basophils Relative: 0.4 % (ref 0.0–3.0)
Eosinophils Absolute: 0.4 10*3/uL (ref 0.0–0.7)
Eosinophils Relative: 3.4 % (ref 0.0–5.0)
Lymphocytes Relative: 20.3 % (ref 12.0–46.0)
MCHC: 33.5 g/dL (ref 30.0–36.0)
Monocytes Relative: 6.5 % (ref 3.0–12.0)
Neutrophils Relative %: 69.4 % (ref 43.0–77.0)
RBC: 4.75 Mil/uL (ref 4.22–5.81)
WBC: 10.4 10*3/uL (ref 4.5–10.5)

## 2011-03-17 LAB — LIPID PANEL
HDL: 42.1 mg/dL (ref >39.00)
Total CHOL/HDL Ratio: 4
Triglycerides: 147 mg/dL (ref 0.0–149.0)
VLDL: 29.4 mg/dL (ref 0.0–40.0)

## 2011-03-17 LAB — MICROALBUMIN / CREATININE URINE RATIO: Microalb, Ur: 0.7 mg/dL (ref 0.0–1.9)

## 2011-03-17 NOTE — Patient Instructions (Signed)
Preventive Health Care: Exercise at least 30-45 minutes a day,  3-4 days a week.  Health Care Power of Attorney & Living Will. Complete if not in place ; these place you in charge of your health care decisions. Eat a low-fat diet with lots of fruits and vegetables, up to 7-9 servings per day. Avoid obesity; your goal is waist measurement < 40 inches.Consume less than 40 grams of sugar per day from foods & drinks with High Fructose Corn Sugar as #1,2,3 or # 4 on label. Follow the low carb nutrition program in [employer] as closely as possible to prevent Diabetes progression & complications. White carbohydrates (potatoes, rice, bread, and pasta) have a high spike of sugar and a high load of sugar. For example a  baked potato has a cup of sugar and a  french fry  2 teaspoons of sugar. Yams, wild  rice, whole grained bread &  wheat pasta have been much lower spike and load of  sugar. Portions should be the size of a deck of cards or your palm.

## 2011-03-17 NOTE — Progress Notes (Signed)
Subjective:    Patient ID: [name] [name], male    DOB: Nov 11, 1950, 60 y.o.   MRN: [id number]  HPI  [name] [name] is here for a physical; he has no acute issues.     Review of Systems HYPERTENSION: Disease Monitoring: Blood pressure range-not monitored regulary  Chest pain, palpitations- no       Dyspnea- no Medications: Compliance- yes  Lightheadedness,Syncope- no    Edema- no  DIABETES: Disease Monitoring: Blood Sugar ranges-not monitored  Polyuria/phagia/dipsia- no       Visual problems- occasional blurring in am; last Ophth exam 12 mos ago Medications: Compliance- yes  Hypoglycemic symptoms- no   HYPERLIPIDEMIA: Disease Monitoring: See symptoms for Hypertension Medications: Compliance- yes  Abd pain, bowel changes- no   Muscle aches- no    Smoking Status:never       Objective:   Physical Exam Gen.: Healthy and well-nourished in appearance. Alert, appropriate and cooperative throughout exam. Head: Normocephalic without obvious abnormalities Eyes: No corneal or conjunctival inflammation noted. Pupils equal round reactive to light and accommodation. Fundal exam is limited. Extraocular motion abnormal: OD deviates medially with superior gaze.  Ears: External  ear exam reveals no significant lesions or deformities. Canals clear .TMs normal. Hearing is grossly normal bilaterally. Nose: External nasal exam reveals no deformity or inflammation. Nasal mucosa are pink and moist. No lesions or exudates noted.  Mouth: Oral mucosa and oropharynx reveal no lesions or exudates. Teeth in good repair. Osteoma of hard palate Neck: No deformities, masses, or tenderness noted. Range of motion & Thyroid normal. Lungs: Normal respiratory effort; chest expands symmetrically. Lungs are clear to auscultation without rales, wheezes, or increased work of breathing. Heart: Normal rate and rhythm. Normal S1 and S2. No gallop, click, or rub. S4 ; no  murmur. Abdomen: Bowel sounds normal;  abdomen soft and nontender. No masses, organomegaly. Ventral  hernias noted. Genitalia/DRE:  There is a small varicocele on the left. The left prostate is normal; the right is upper limits of normal to mildly enlarged. No induration or nodularity is present.  .                                                                                   Musculoskeletal/extremities: No deformity or scoliosis noted of  the thoracic or lumbar spine but minor asymmetry of thoracic muscles. No clubbing, cyanosis, edema, or deformity noted. Range of motion  normal .Tone & strength  normal.Joints normal. Nail health;mild toenail fungal changes. Vascular: Carotid, radial artery, dorsalis pedis and  posterior tibial pulses are full and equal. No bruits present. Neurologic: Alert and oriented x3. Deep tendon reflexes symmetrical and normal.Light touch over feet normal          Skin: Intact without suspicious lesions or rashes. Lymph: No cervical, axillary, or inguinal lymphadenopathy present. Psych: Mood and affect are normal. Normally interactive  Assessment & Plan:  #1 comprehensive physical exam; no acute findings #2 see Problem List with Assessments & Recommendations Plan: see Orders

## 2011-04-05 ENCOUNTER — Encounter: Payer: Self-pay | Admitting: *Deleted

## 2011-04-08 ENCOUNTER — Ambulatory Visit: Payer: BC Managed Care – PPO | Admitting: Gastroenterology

## 2011-04-12 ENCOUNTER — Ambulatory Visit (INDEPENDENT_AMBULATORY_CARE_PROVIDER_SITE_OTHER): Payer: BC Managed Care – PPO | Admitting: Gastroenterology

## 2011-04-12 ENCOUNTER — Encounter: Payer: Self-pay | Admitting: Gastroenterology

## 2011-04-12 ENCOUNTER — Ambulatory Visit (INDEPENDENT_AMBULATORY_CARE_PROVIDER_SITE_OTHER): Payer: BC Managed Care – PPO | Admitting: General Surgery

## 2011-04-12 ENCOUNTER — Encounter (INDEPENDENT_AMBULATORY_CARE_PROVIDER_SITE_OTHER): Payer: Self-pay | Admitting: General Surgery

## 2011-04-12 VITALS — BP 128/86 | HR 68 | Temp 97.9°F | Resp 16 | Ht 69.0 in | Wt 196.6 lb

## 2011-04-12 VITALS — BP 118/76 | HR 72 | Ht 69.0 in | Wt 198.4 lb

## 2011-04-12 DIAGNOSIS — K645 Perianal venous thrombosis: Secondary | ICD-10-CM

## 2011-04-12 DIAGNOSIS — Z86010 Personal history of colonic polyps: Secondary | ICD-10-CM

## 2011-04-12 DIAGNOSIS — I2510 Atherosclerotic heart disease of native coronary artery without angina pectoris: Secondary | ICD-10-CM

## 2011-04-12 MED ORDER — LIDOCAINE 5 % EX OINT: TOPICAL_OINTMENT | CUTANEOUS | Status: DC

## 2011-04-12 MED ORDER — PEG-KCL-NACL-NASULF-NA ASC-C 100 G PO SOLR: 1.0000 | Freq: Once | ORAL | Status: DC

## 2011-04-12 NOTE — Progress Notes (Signed)
History of Present Illness:  This is a 60 year old Caucasian male with coronary artery disease on Plavix 75 mg a day and Zocor 40 mg a day. He had colonoscopy by Dr. Terrial Rhodes 5 years ago with removal of 2 small adenomatous polyps. Family history is noncontributory. Patient has regular bowel movements, but does have external hemorrhoids, and has recently had extreme pain in his rectum a rectal bleeding. His appetite is good and his weight is stable. He denies systemic or hepatobiliary problems. He is due for followup colonoscopy exam.  I have reviewed this patient's present history, medical and surgical past history, allergies and medications.     ROS: The remainder of the 10 point ROS is negative... no symptoms of angina, decreased exercise tolerance, chest pain, shortness of breath, or other cardiopulmonary complaints. He does give a history of mild BPH, has a family history of prostate cancer. He is allergic to aspirin. Review of his previous colonoscopy shows large amounts of sedation required for colonoscopy. He has excellent exercise tolerance and walks 2 miles a day. He denies abuse of alcohol, cigarettes, or NSAIDs.     Physical Exam: General well developed well nourished patient in no acute distress, appearing his stated age Eyes PERRLA, no icterus, fundoscopic exam per opthamologist Skin no lesions noted Neck supple, no adenopathy, no thyroid enlargement, no tenderness Chest clear to percussion and auscultation Heart no significant murmurs, gallops or rubs noted Abdomen no hepatosplenomegaly masses or tenderness, BS normal.  Rectal inspection normal no fissures, or fistulae noted. Noted is a large posterior lateral thrombosed external hemorrhoid which is extremely tender to touch. Stool around this area was guaiac-negative. Digital exam was not performed. Extremities no acute joint lesions, edema, phlebitis or evidence of cellulitis. Neurologic patient oriented x 3, cranial nerves  intact, no focal neurologic deficits noted. Psychological mental status normal and normal affect.  Assessment and plan: Thrombosed external hemorrhoid which broke our surgical excision. We have referred him to [facility] surgery for acute care. I prescribed 5% Xylocaine ointment locally and with continued Metamucil use. We'll schedule colonoscopy in 2 weeks time with propofol sedation. We will hold Plavix one week before this procedure. The patient has had recent spinal surgery where he held Plavix for 2 weeks without complications. However, we will seek cardiology approval for this course of action. The patient does have moderate acid reflux symptoms for which he uses when necessary TUMS. He denies dysphagia or hepatobiliary complaints.  No diagnosis found.

## 2011-04-12 NOTE — Progress Notes (Signed)
Subjective:     Patient ID: [name] [name], male   DOB: [birth year], 60 y.o.   MRN: [id number]  HPIPatient is a 60 year old male who suffered over Dr. Sheryn Bison after he was seen for rectal pain and he who is scheduled for a colonoscopy he has a thrombosed hemorrhoid on the right lateral side to Dr. Jarold Motto thought would be best managed with an I&D the patient says she's had problems with hemorrhoids off and a one third of the last couple of weeks has been more continuous. 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   Review of Systems Current Outpatient Prescriptions  Medication Sig Dispense Refill  . amLODipine (NORVASC) 10 MG tablet TAKE ONE TABLET BY MOUTH EVERY DAY  90 tablet  3  . AVAPRO 300 MG tablet TAKE ONE TABLET BY MOUTH EVERY DAY  90 each  0  . hydrochlorothiazide (,MICROZIDE/HYDRODIURIL,) 12.5 MG capsule TAKE ONE CAPSULE BY MOUTH EVERY DAY  90 capsule  3  . KLOR-CON M20 20 MEQ tablet TAKE ONE TABLET BY MOUTH EVERY DAY  90 each  1  . metFORMIN (GLUCOPHAGE) 500 MG tablet Take 1 tablet (500 mg total) by mouth 2 (two) times daily with a meal.  60 tablet  6  . PLAVIX 75 MG tablet take 1 tablet by mouth once daily  90 tablet  3  . simvastatin (ZOCOR) 40 MG tablet TAKE ONE TABLET BY MOUTH EVERY DAY  90 tablet  1        Past Surgical History  Procedure Date  . Esophagogastroduodenoscopy 2003    ERD, stricture dilated  . Colonoscopy 2006    tics , ADENOMATOUS polyps. F/U due 2011  . Laminectomy 2011    L4 , Dr Newell Coral  . Catherterization 2007    Objective:   Physical Exam BP 128/86  Pulse 68  Temp(Src) 97.9 F (36.6 C) (Temporal)  Resp 16  Ht 5\' 9"  (1.753 m)  Wt 196 lb 9.6 oz (89.177 kg)  BMI 29.03 kg/m2 Patient is a nickel sized thrombosed hemorrhoid on the right lateral side and was amenable to drainage of this which I did with about 5 cc 1%  Xylocaine metal transverse incision of bladder nickel size clot and also try to destroy the vein it was not significant bleed afterwards and the patient R. he has a prescription for lidocaine ointment. Patient also has some old black medicines if he would need more pain medication but hopefully can use the lidocaine and will be able to go to work tomorrow  If the patient is still having pain from his hemorrhoids since he scheduled for colonoscopy about 2 weeks that may need to be postponed patient we plan to proceed on with the colonoscopy at that time. I did not Anoscopic exam today since he will have a colonoscopy and diet that he's got no hemorrhoids additional need any type of hemorrhoidal surgery besides is present I. He states he's not bothered with constipation and he is on Metamucil.    Assessment:     I did not give her a death to return appointment to see [place] since the schedule for colonoscopy C-spine to have significant internal hemorrhoids rubber bands Leola type procedures could be done if needed    Plan:    Return to see [place] on a p.r.n. basis

## 2011-04-12 NOTE — Patient Instructions (Signed)
Tub soaks and I am also in the morning. Used to lidocaine ointment and works better if it's applied on gauze place in the anal canal. May be used as frequent as needed to keep the near area clean history are most important  to provide good  and quick healing

## 2011-04-12 NOTE — Patient Instructions (Signed)
You have been scheduled for a colonoscopy with propofol. Please follow written instructions given to you at your visit today.  Please pick up your Moviprep kit at the pharmacy within the next 2-3 days.  We have sent the following medications to your pharmacy for you to pick up at your convenience: Xylocaine 5% ointment. You have been scheduled for an appointment with Dr Zachery Dakins at [facility]. Your appointment is Today 04/12/11 at 3:00 pm. Please arrive at 2:45 pm for registration. Make certain to bring a list of current medications, including any over the counter medications or vitamins. Also bring your co-pay if you have one as well as your insurance cards. [facility] is located at [address]. Should you need to reschedule your appointment, please contact them at [phone]. CC: Dr Zachery Dakins, Dr Alwyn Ren

## 2011-04-25 ENCOUNTER — Ambulatory Visit (AMBULATORY_SURGERY_CENTER): Payer: BC Managed Care – PPO | Admitting: Gastroenterology

## 2011-04-25 ENCOUNTER — Encounter: Payer: Self-pay | Admitting: Gastroenterology

## 2011-04-25 DIAGNOSIS — Z860100 Personal history of colon polyps, unspecified: Secondary | ICD-10-CM | POA: Insufficient documentation

## 2011-04-25 DIAGNOSIS — Z1211 Encounter for screening for malignant neoplasm of colon: Secondary | ICD-10-CM

## 2011-04-25 DIAGNOSIS — D126 Benign neoplasm of colon, unspecified: Secondary | ICD-10-CM

## 2011-04-25 DIAGNOSIS — K5730 Diverticulosis of large intestine without perforation or abscess without bleeding: Secondary | ICD-10-CM | POA: Insufficient documentation

## 2011-04-25 DIAGNOSIS — K645 Perianal venous thrombosis: Secondary | ICD-10-CM

## 2011-04-25 LAB — GLUCOSE, CAPILLARY: Glucose-Capillary: 105 mg/dL — ABNORMAL HIGH (ref 70–99)

## 2011-04-25 MED ORDER — SODIUM CHLORIDE 0.9 % IV SOLN: 500.0000 mL | INTRAVENOUS | Status: DC

## 2011-04-25 NOTE — Patient Instructions (Signed)
Please refer to your blue and neon green sheets for instructions regarding diet and activity for the rest of today.  You may resume your medications as you would normally take them.  

## 2011-04-26 ENCOUNTER — Telehealth: Payer: Self-pay | Admitting: *Deleted

## 2011-04-26 NOTE — Telephone Encounter (Signed)
Name identified on answering machine, message left

## 2011-07-21 ENCOUNTER — Other Ambulatory Visit: Payer: Self-pay | Admitting: Internal Medicine

## 2012-01-02 ENCOUNTER — Other Ambulatory Visit: Payer: Self-pay | Admitting: Internal Medicine

## 2012-01-13 ENCOUNTER — Other Ambulatory Visit: Payer: Self-pay | Admitting: Internal Medicine

## 2012-01-15 ENCOUNTER — Other Ambulatory Visit: Payer: Self-pay | Admitting: Internal Medicine

## 2012-01-18 ENCOUNTER — Other Ambulatory Visit: Payer: Self-pay | Admitting: Internal Medicine

## 2012-01-18 ENCOUNTER — Other Ambulatory Visit (HOSPITAL_COMMUNITY): Payer: Self-pay | Admitting: Internal Medicine

## 2012-01-18 NOTE — Telephone Encounter (Signed)
..   Requested Prescriptions   Pending Prescriptions Disp Refills  . irbesartan (AVAPRO) 300 MG tablet [Pharmacy Med Name: IRBESARTAN 300MG      TAB] 30 tablet 11    Sig: TAKE ONE TABLET BY MOUTH EVERY DAY  . simvastatin (ZOCOR) 40 MG tablet [Pharmacy Med Name: SIMVASTATIN 40MG     TAB] 30 tablet 11    Sig: TAKE ONE TABLET BY MOUTH EVERY DAY

## 2012-01-19 NOTE — Telephone Encounter (Signed)
..   Requested Prescriptions   Pending Prescriptions Disp Refills  . KLOR-CON M20 20 MEQ tablet [Pharmacy Med Name: KLOR-CON M20        TAB] 30 each 6    Sig: TAKE ONE TABLET BY MOUTH EVERY DAY  . metFORMIN (GLUCOPHAGE) 500 MG tablet [Pharmacy Med Name: METFORMIN 500MG      TAB] 30 tablet 6    Sig: TAKE ONE TABLET BY MOUTH TWICE DAILY WITH FOOD

## 2012-10-08 ENCOUNTER — Other Ambulatory Visit: Payer: Self-pay | Admitting: Internal Medicine

## 2012-10-12 ENCOUNTER — Other Ambulatory Visit (HOSPITAL_COMMUNITY): Payer: Self-pay | Admitting: Internal Medicine

## 2012-12-24 ENCOUNTER — Ambulatory Visit (INDEPENDENT_AMBULATORY_CARE_PROVIDER_SITE_OTHER): Payer: BC Managed Care – PPO | Admitting: Internal Medicine

## 2012-12-24 ENCOUNTER — Encounter: Payer: Self-pay | Admitting: Internal Medicine

## 2012-12-24 VITALS — BP 136/80 | HR 61 | Temp 98.2°F | Ht 70.0 in | Wt 197.0 lb

## 2012-12-24 DIAGNOSIS — Z0000 Encounter for general adult medical examination without abnormal findings: Secondary | ICD-10-CM

## 2012-12-24 DIAGNOSIS — Z2911 Encounter for prophylactic immunotherapy for respiratory syncytial virus (RSV): Secondary | ICD-10-CM

## 2012-12-24 DIAGNOSIS — Z23 Encounter for immunization: Secondary | ICD-10-CM

## 2012-12-24 NOTE — Patient Instructions (Addendum)

## 2012-12-24 NOTE — Progress Notes (Signed)
  Subjective:    Patient ID: [name] [name], male    DOB: 1951-04-07, 62 y.o.   MRN: [id number]  HPI  He is here for a physical;acute issues denied.     Review of Systems HYPERTENSION: Disease Monitoring: Blood pressure range-not monitored regularly  Chest pain, palpitations- no       Dyspnea- no Medications: Compliance- yes  Lightheadedness,Syncope-no   Edema- no  DIABETES: Disease Monitoring: Blood Sugar ranges-no  Polyuria/phagia/dipsia-urination post diuretic       Visual problems- decreased acuity; seen annually Medications: Compliance-yes Hypoglycemic symptoms- no  HYPERLIPIDEMIA: Disease Monitoring: See symptoms for Hypertension Medications: Compliance- yes  Abd pain, bowel changes- no   Muscle aches- some    Objective:   Physical Exam Gen.:  well-nourished in appearance. Alert, appropriate and cooperative throughout exam. Head: Normocephalic without obvious abnormalities; no alopecia  Eyes: No corneal or conjunctival inflammation noted.  Extraocular motion intact. Vision grossly normal with lenses OS; markedly decreased OD. Ears: External  ear exam reveals no significant lesions or deformities. Canals clear .TMs normal. Hearing is grossly normal bilaterally. Nose: External nasal exam reveals no deformity or inflammation. Nasal mucosa are pink and moist. No lesions or exudates noted.   Mouth: Oral mucosa and oropharynx reveal no lesions or exudates. Teeth in good repair. Neck: No deformities, masses, or tenderness noted. Range of motion & Thyroid normal. Lungs: Normal respiratory effort; chest expands symmetrically. Lungs are clear to auscultation without rales, wheezes, or increased work of breathing. Heart: Normal rate and rhythm. Normal S1 and S2. No gallop, click, or rub.S4 w/o murmur. Abdomen: Bowel sounds normal; abdomen soft and nontender. No masses or organomegaly . Ventral hernia noted. Genitalia: Genitalia normal except for left varices. Prostate is  symmetrically enlarged1.5+  w/o nodularity or induration.                              Musculoskeletal/extremities: No deformity or scoliosis noted of  the thoracic or lumbar spine.  No clubbing, cyanosis, edema, or significant extremity  deformity noted. Range of motion normal .Tone & strength  Normal. Joints normal . Chronic great toenail fungal changes Able to lie down & sit up w/o help. Negative SLR bilaterally Vascular: Carotid, radial artery, dorsalis pedis and  posterior tibial pulses are full and equal. No bruits present. Neurologic: Alert and oriented x3. Deep tendon reflexes symmetrical and normal.  Light touch normal over feet.       Skin: Intact without suspicious lesions or rashes. Lymph: No cervical, axillary, or inguinal lymphadenopathy present. Psych: Mood and affect are normal. Normally interactive                                                                                        Assessment & Plan:  #1 comprehensive physical exam; no acute findings  Plan: see Orders  & Recommendations

## 2012-12-25 ENCOUNTER — Other Ambulatory Visit (INDEPENDENT_AMBULATORY_CARE_PROVIDER_SITE_OTHER): Payer: BC Managed Care – PPO

## 2012-12-25 ENCOUNTER — Encounter: Payer: Self-pay | Admitting: Internal Medicine

## 2012-12-25 DIAGNOSIS — Z0000 Encounter for general adult medical examination without abnormal findings: Secondary | ICD-10-CM

## 2012-12-25 LAB — CBC WITH DIFFERENTIAL/PLATELET
Basophils Relative: 0.3 % (ref 0.0–3.0)
Eosinophils Absolute: 0.4 10*3/uL (ref 0.0–0.7)
Hemoglobin: 15.6 g/dL (ref 13.0–17.0)
MCHC: 33.8 g/dL (ref 30.0–36.0)
MCV: 97 fl (ref 78.0–100.0)
Monocytes Absolute: 0.6 10*3/uL (ref 0.1–1.0)
Neutro Abs: 6.6 10*3/uL (ref 1.4–7.7)
RBC: 4.77 Mil/uL (ref 4.22–5.81)

## 2012-12-25 LAB — LIPID PANEL
LDL Cholesterol: 103 mg/dL — ABNORMAL HIGH (ref 0–99)
Total CHOL/HDL Ratio: 4

## 2012-12-25 LAB — BASIC METABOLIC PANEL
CO2: 26 mEq/L (ref 19–32)
Chloride: 104 mEq/L (ref 96–112)
Creatinine, Ser: 1 mg/dL (ref 0.4–1.5)
Sodium: 139 mEq/L (ref 135–145)

## 2012-12-25 LAB — TSH: TSH: 0.8 u[IU]/mL (ref 0.35–5.50)

## 2012-12-25 LAB — MICROALBUMIN / CREATININE URINE RATIO
Creatinine,U: 92.8 mg/dL
Microalb Creat Ratio: 0.6 mg/g (ref 0.0–30.0)

## 2013-01-09 ENCOUNTER — Other Ambulatory Visit (HOSPITAL_COMMUNITY): Payer: Self-pay | Admitting: Internal Medicine

## 2013-01-10 ENCOUNTER — Other Ambulatory Visit (HOSPITAL_COMMUNITY): Payer: Self-pay | Admitting: *Deleted

## 2013-01-10 MED ORDER — POTASSIUM CHLORIDE CRYS ER 20 MEQ PO TBCR: EXTENDED_RELEASE_TABLET | ORAL | Status: DC

## 2013-01-11 ENCOUNTER — Other Ambulatory Visit: Payer: Self-pay | Admitting: Internal Medicine

## 2013-01-11 NOTE — Telephone Encounter (Signed)
Pt has not been in for 2 years.

## 2013-01-14 ENCOUNTER — Telehealth: Payer: Self-pay | Admitting: *Deleted

## 2013-01-14 NOTE — Telephone Encounter (Signed)
Patient left message on triage line stating he had questions about some medications. Attempted to call pt back, left message on voice mail to return our call.

## 2013-01-16 ENCOUNTER — Telehealth: Payer: Self-pay | Admitting: *Deleted

## 2013-01-16 ENCOUNTER — Other Ambulatory Visit: Payer: Self-pay | Admitting: *Deleted

## 2013-01-16 MED ORDER — ONETOUCH DELICA LANCETS FINE MISC: 1.0000 "application " | Freq: Every day | Status: DC

## 2013-01-16 MED ORDER — IRBESARTAN 300 MG PO TABS: 300.0000 mg | ORAL_TABLET | Freq: Every day | ORAL | Status: DC

## 2013-01-16 MED ORDER — METFORMIN HCL 500 MG PO TABS: 500.0000 mg | ORAL_TABLET | Freq: Two times a day (BID) | ORAL | Status: DC

## 2013-01-16 MED ORDER — SIMVASTATIN 40 MG PO TABS: 40.0000 mg | ORAL_TABLET | Freq: Every day | ORAL | Status: DC

## 2013-01-16 MED ORDER — HYDROCHLOROTHIAZIDE 12.5 MG PO CAPS: 12.5000 mg | ORAL_CAPSULE | Freq: Every day | ORAL | Status: DC

## 2013-01-16 MED ORDER — ONETOUCH ULTRA BLUE VI STRP: ORAL_STRIP | Status: DC

## 2013-01-16 MED ORDER — AMLODIPINE BESYLATE 10 MG PO TABS: 10.0000 mg | ORAL_TABLET | Freq: Every day | ORAL | Status: DC

## 2013-01-16 MED ORDER — POTASSIUM CHLORIDE CRYS ER 20 MEQ PO TBCR: EXTENDED_RELEASE_TABLET | ORAL | Status: DC

## 2013-01-16 NOTE — Telephone Encounter (Signed)
Patient needs a refill for test strips and lancets for reading blood sugar levels. Please advise.

## 2013-01-16 NOTE — Telephone Encounter (Signed)
[name] [name] has requested that Dr. Alwyn Ren take over all of his medications. He requires 90 day prescriptions and he also needs a prescription refill for one touch strips and lancets.  As soon as possible, all prescriptions must be sent to [facility] on [address]. Patient is aware that he must receive his plavix from his cardiologist.  Please Advise.

## 2013-01-16 NOTE — Telephone Encounter (Signed)
OK X 1 year 

## 2013-01-16 NOTE — Telephone Encounter (Signed)
All Rx has been changed to 90 day supply for patient including his potassium.  I spoke with Creig Hines Dr. Alwyn Ren CMA about the patient potassium medication and she and I reviewed the  patient's labs and 90 day supply was refilled.

## 2013-01-16 NOTE — Telephone Encounter (Signed)
Faxed.   KP 

## 2013-01-25 ENCOUNTER — Other Ambulatory Visit (HOSPITAL_COMMUNITY): Payer: Self-pay | Admitting: *Deleted

## 2013-01-25 DIAGNOSIS — Z888 Allergy status to other drugs, medicaments and biological substances status: Secondary | ICD-10-CM

## 2013-01-25 MED ORDER — CLOPIDOGREL BISULFATE 75 MG PO TABS: 75.0000 mg | ORAL_TABLET | Freq: Every day | ORAL | Status: DC

## 2013-01-25 NOTE — Telephone Encounter (Signed)
Previous refill for Plavix should [name] [name] been called to [facility] on [address], not [facility], and this was corrected.

## 2013-01-25 NOTE — Telephone Encounter (Signed)
[name] [name] called to try and make a follow up appointment with Dr. Gala Romney as instructed by a letter that he received.  I explained to him that Dr. Gala Romney was now only seeing heart failure patients and he would be assigned a new cardiologist at [facility]  He will need to call them for an appointment which he said he would. In the meantime, I refilled hisPlavix for one month until he could be seen.

## 2013-02-01 ENCOUNTER — Encounter: Payer: Self-pay | Admitting: Cardiology

## 2013-02-01 ENCOUNTER — Ambulatory Visit (INDEPENDENT_AMBULATORY_CARE_PROVIDER_SITE_OTHER): Payer: 59 | Admitting: Cardiology

## 2013-02-01 VITALS — BP 112/64 | HR 63 | Ht 70.0 in | Wt 194.0 lb

## 2013-02-01 DIAGNOSIS — I2510 Atherosclerotic heart disease of native coronary artery without angina pectoris: Secondary | ICD-10-CM

## 2013-02-01 DIAGNOSIS — E119 Type 2 diabetes mellitus without complications: Secondary | ICD-10-CM

## 2013-02-01 DIAGNOSIS — T39095A Adverse effect of salicylates, initial encounter: Secondary | ICD-10-CM

## 2013-02-01 DIAGNOSIS — I10 Essential (primary) hypertension: Secondary | ICD-10-CM

## 2013-02-01 DIAGNOSIS — E785 Hyperlipidemia, unspecified: Secondary | ICD-10-CM

## 2013-02-01 DIAGNOSIS — Z888 Allergy status to other drugs, medicaments and biological substances status: Secondary | ICD-10-CM

## 2013-02-01 MED ORDER — CLOPIDOGREL BISULFATE 75 MG PO TABS: 75.0000 mg | ORAL_TABLET | Freq: Every day | ORAL | Status: DC

## 2013-02-01 NOTE — Patient Instructions (Signed)
Continue your current therapy  Work on losing weight, eating a healthy diet, and exercising more.  I will see you in one year.

## 2013-02-01 NOTE — Progress Notes (Signed)
[name] [name] Date of Birth: 1951/02/20 Medical Record #[id number]  History of Present Illness: [name] [name] is seen for cardiac followup today. He is a 62 year old white male who has a history of coronary disease diagnosed by cardiac CTA in 2007. He was noted to have moderate plaque in the LAD and diagonal distribution. Calcium score was high. It is estimated at 50% LAD lesion proximally. He has had followup stress tests which have been normal. His last stress test in August 2012 was normal. He was able to exercise for 8 minutes. On followup today he denies any symptoms of chest pain, shortness of breath, or palpitations. He states he tries to walk 3 days a week. Recently he has had some problems with his ankle and back that have limited his activity. He does have diabetes, hyperlipidemia, and hypertension.  Current Outpatient Prescriptions on File Prior to Visit  Medication Sig Dispense Refill  . amLODipine (NORVASC) 10 MG tablet Take 1 tablet (10 mg total) by mouth daily.  90 tablet  1  . hydrochlorothiazide (MICROZIDE) 12.5 MG capsule Take 1 capsule (12.5 mg total) by mouth daily.  90 capsule  3  . irbesartan (AVAPRO) 300 MG tablet Take 1 tablet (300 mg total) by mouth daily.  90 tablet  1  . metFORMIN (GLUCOPHAGE) 500 MG tablet Take 1 tablet (500 mg total) by mouth 2 (two) times daily with a meal.  90 tablet  1  . ONE TOUCH ULTRA TEST test strip Check blood sugar once daily  100 each  12  . ONETOUCH DELICA LANCETS FINE MISC 1 application by Does not apply route daily.  100 each  12  . potassium chloride SA (KLOR-CON M20) 20 MEQ tablet TAKE ONE TABLET BY MOUTH EVERY DAY  90 tablet  3  . simvastatin (ZOCOR) 40 MG tablet Take 1 tablet (40 mg total) by mouth at bedtime.  90 tablet  3   No current facility-administered medications on file prior to visit.    Allergies  Allergen Reactions  . Aspirin     Angioedema    Past Medical History  Diagnosis Date  . GERD (gastroesophageal reflux  disease)     esophageal stricture, pmh  . HTN (hypertension)   . HLD (hyperlipidemia)   . DM2 (diabetes mellitus, type 2)   . Amblyopia   . CAD (coronary artery disease)     cardiac CT with a 50% LAD lesion in 2007  . Hypopotassemia   . Esophageal stricture 2003  . Diverticulosis   . Adenomatous colon polyp 08/10/04  . Elevated LFTs     secondary to labetalol  . Hiatal hernia 01/04/00  . External hemorrhoids 2012    lanced by Dr Zachery Dakins  . Diabetes mellitus     Past Surgical History  Procedure Laterality Date  . Esophagogastroduodenoscopy  2003    ERD, stricture dilated  . Colonoscopy  2006    tics , ADENOMATOUS polyps. F/U due 2011  . Laminectomy  2011    L4 , Dr Newell Coral  . Catherterization  2007  . Colonoscopy  2012    Tics    History  Smoking status  . Never Smoker   Smokeless tobacco  . Never Used    History  Alcohol Use  . 5.0 oz/week  . 10 drink(s) per week    Comment:  3-4 drinks / week    Family History  Problem Relation Age of Onset  . Heart attack Mother 25  . Stroke Mother 25  .  Prostate cancer Father   . Diabetes Father   . Heart disease Father 3    CBAG   . Prostate cancer Father   . Diabetes Brother   . Breast cancer Maternal Aunt   . Breast cancer Maternal Aunt     breast  . Heart attack Maternal Grandfather 61  . Diabetes Paternal Grandmother   . Stroke Paternal Grandfather 68    Review of Systems: The review of systems is positive for recent back pain. He states this is the first pain he said since he had back surgery 3 years ago. He has some arthralgias in his ankles. All other systems were reviewed and are negative.  Physical Exam: BP 112/64  Pulse 63  Ht 5\' 10"  (1.778 m)  Wt 194 lb (87.998 kg)  BMI 27.84 kg/m2  SpO2 96% He is a pleasant white male in no acute distress. He is overweight. HEENT: Normocephalic, atraumatic. Pupils are equal round and reactive to light accommodation. Extraocular movements are full.  Oropharynx is clear. Neck is supple without JVD, adenopathy, thyromegaly, or bruits. Lungs: Clear Cardiovascular: Regular rate and rhythm. Normal S1 and S2. No gallop, murmur, or click. Abdomen: Obese, soft, and nontender. No hepatosplenomegaly. No masses or bruits. Extremities: No cyanosis or edema. Pulses are 2+ and symmetric throughout. Skin: Warm and dry Neuro: Alert and oriented x3. Cranial nerves II through XII are intact. No focal motor or sensory deficits.  LABORATORY DATA: Lab Results  Component Value Date   WBC 9.2 12/25/2012   HGB 15.6 12/25/2012   HCT 46.2 12/25/2012   PLT 240.0 12/25/2012   GLUCOSE 129* 12/25/2012   CHOL 169 12/25/2012   TRIG 99.0 12/25/2012   HDL 46.40 12/25/2012   LDLCALC 103* 12/25/2012   ALT 27 03/17/2011   AST 22 03/17/2011   NA 139 12/25/2012   K 3.9 12/25/2012   CL 104 12/25/2012   CREATININE 1.0 12/25/2012   BUN 16 12/25/2012   CO2 26 12/25/2012   TSH 0.80 12/25/2012   PSA 1.17 12/25/2012   HGBA1C 6.9* 12/25/2012   MICROALBUR 0.6 12/25/2012   ECG on 12/24/2012 was normal.  Assessment / Plan: 1. Coronary disease with nonobstructive LAD disease by coronary CTA in 2007. Patient is asymptomatic. He had a normal stress test in August 2012. At this point I recommended risk factor modification. Continue Plavix. Recommend followup in one year at which time we will plan on repeating a stress test.  2. Hyperlipidemia. Goal LDL of less than 100 with optional goal of less than or equal to 70. We discussed treatment options including increasing his simvastatin dose, switching to a different statin, or improving on lifestyle modifications. He would like to work at losing weight and getting more aerobic exercise. If this does not affect more reduction in his LDL level consider switching him to Lipitor or Crestor.  3. Diabetes mellitus type 2.  4. Hypertension-controlled.

## 2013-04-22 ENCOUNTER — Other Ambulatory Visit: Payer: Self-pay | Admitting: Internal Medicine

## 2013-04-22 NOTE — Telephone Encounter (Signed)
Metformin refill sent to pharmacy

## 2013-05-02 ENCOUNTER — Other Ambulatory Visit: Payer: Self-pay

## 2013-06-19 ENCOUNTER — Other Ambulatory Visit: Payer: Self-pay | Admitting: Internal Medicine

## 2013-06-19 NOTE — Telephone Encounter (Signed)
Amlodipine refilled per protocol. JG//CMA 

## 2013-06-28 ENCOUNTER — Other Ambulatory Visit: Payer: Self-pay | Admitting: Internal Medicine

## 2013-06-28 NOTE — Telephone Encounter (Signed)
Avapro and metformin refilled per protocol. JG//CMA

## 2013-07-23 LAB — HM DIABETES EYE EXAM: HM DIABETIC EYE EXAM: NORMAL

## 2013-07-26 ENCOUNTER — Ambulatory Visit (INDEPENDENT_AMBULATORY_CARE_PROVIDER_SITE_OTHER): Payer: 59 | Admitting: Internal Medicine

## 2013-07-26 ENCOUNTER — Encounter: Payer: Self-pay | Admitting: Internal Medicine

## 2013-07-26 VITALS — BP 113/68 | HR 68 | Temp 99.0°F | Wt 197.6 lb

## 2013-07-26 DIAGNOSIS — J209 Acute bronchitis, unspecified: Secondary | ICD-10-CM

## 2013-07-26 DIAGNOSIS — E119 Type 2 diabetes mellitus without complications: Secondary | ICD-10-CM

## 2013-07-26 DIAGNOSIS — I10 Essential (primary) hypertension: Secondary | ICD-10-CM

## 2013-07-26 MED ORDER — SPIRONOLACTONE 25 MG PO TABS: 25.0000 mg | ORAL_TABLET | Freq: Every day | ORAL | Status: DC

## 2013-07-26 MED ORDER — AZITHROMYCIN 250 MG PO TABS: ORAL_TABLET | ORAL | Status: DC

## 2013-07-26 MED ORDER — HYDROCODONE-HOMATROPINE 5-1.5 MG/5ML PO SYRP: 5.0000 mL | ORAL_SOLUTION | Freq: Four times a day (QID) | ORAL | Status: DC | PRN

## 2013-07-26 NOTE — Progress Notes (Signed)
   Subjective:    Patient ID: [name] [name], male    DOB: 04-23-1951, 63 y.o.   MRN: [id number]  HPI   Symptoms began 3 days ago as a nonproductive cough which has progressed. There were no exposures to ill individuals.  The cough is manifested as paroxysmal spells every 15 minutes or so.  In the morning he will have some milky sputum.  He has rhinitis with clear drainage.  He's been using Vicks nasal spray and nonsteroidals  He also describes some itchy, watery eyes. He's had some associated sneezing. He believes he's had some low-grade fever without chills or sweats   Review of Systems   He has never smoked  He denies pleuritic-type chest pain; nasal purulence; purulent sputum; frontal sinus pain; facial sinus pain; wheezing; or shortness of breath. Reflux is not significant.          Objective:   Physical Exam General appearance:good health ;well nourished; no acute distress or increased work of breathing is present.  No  lymphadenopathy about the head, neck, or axilla noted.   Eyes: No conjunctival inflammation or lid edema is present.   Ears:  External ear exam shows no significant lesions or deformities.  Otoscopic examination reveals clear canals, tympanic membranes are intact bilaterally without bulging, retraction, inflammation or discharge.  Nose:  External nasal examination shows no deformity or inflammation. Nasal mucosa are pink and moist without lesions or exudates. No septal dislocation or deviation.No obstruction to airflow.   Oral exam: Dental hygiene is good; lips and gums are healthy appearing.There is no oropharyngeal erythema or exudate noted.   Neck:  No deformities,  masses, or tenderness noted.    Heart:  Normal rate and regular rhythm. S1 and S2 normal without gallop, murmur, click, rub or other extra sounds.   Lungs:Chest clear to auscultation; no wheezes, rhonchi,rales ,or rubs present.No increased work of breathing.  Cough with every  inspiration  Extremities:  No cyanosis, edema, or clubbing  noted    Skin: Warm & damp      Assessment & Plan:  #1 acute bronchitis  #2 rhinitis Plan: See orders and recommendations

## 2013-07-26 NOTE — Progress Notes (Signed)
Pre visit review using our clinic review tool, if applicable. No additional management support is needed unless otherwise documented below in the visit note. 

## 2013-07-26 NOTE — Assessment & Plan Note (Signed)
Change HCTZ & K+ to Spironolactone

## 2013-07-26 NOTE — Patient Instructions (Signed)
Plain Mucinex (NOT D) for thick secretions ;force NON dairy fluids .   Nasal cleansing in the shower as discussed with lather of mild shampoo.After 10 seconds wash off lather while  exhaling through nostrils. Make sure that all residual soap is removed to prevent irritation.  Fluticasone 1 spray in each nostril twice a day as needed. Use the "crossover" technique into opposite nostril spraying toward opposite ear @ 45 degree angle, not straight up into nostril.  Use a Neti pot daily only  as needed for significant sinus congestion; going from open side to congested side . Plain Allegra (NOT D )  160 daily , Loratidine 10 mg , OR Zyrtec 10 mg @ bedtime  as needed for itchy eyes & sneezing.   Advair one inhalation every 12 hours; gargle and spit after use 

## 2013-07-30 ENCOUNTER — Telehealth: Payer: Self-pay

## 2013-07-30 NOTE — Telephone Encounter (Signed)
Relevant patient education assigned to patient using Emmi. ° °

## 2013-08-26 ENCOUNTER — Other Ambulatory Visit (INDEPENDENT_AMBULATORY_CARE_PROVIDER_SITE_OTHER): Payer: 59

## 2013-08-26 DIAGNOSIS — I10 Essential (primary) hypertension: Secondary | ICD-10-CM

## 2013-08-26 DIAGNOSIS — E119 Type 2 diabetes mellitus without complications: Secondary | ICD-10-CM

## 2013-08-26 LAB — BASIC METABOLIC PANEL
BUN: 14 mg/dL (ref 6–23)
CALCIUM: 9.1 mg/dL (ref 8.4–10.5)
CHLORIDE: 102 meq/L (ref 96–112)
CO2: 28 mEq/L (ref 19–32)
Creatinine, Ser: 0.9 mg/dL (ref 0.4–1.5)
GFR: 89.27 mL/min (ref >60.00)
Glucose, Bld: 124 mg/dL — ABNORMAL HIGH (ref 70–99)
Potassium: 3.7 mEq/L (ref 3.5–5.1)
Sodium: 137 mEq/L (ref 135–145)

## 2013-08-26 LAB — MICROALBUMIN / CREATININE URINE RATIO
Creatinine,U: 129.4 mg/dL
MICROALB UR: 1.2 mg/dL (ref 0.0–1.9)
MICROALB/CREAT RATIO: 0.9 mg/g (ref 0.0–30.0)

## 2013-08-26 LAB — HEMOGLOBIN A1C: HEMOGLOBIN A1C: 6.9 % — AB (ref 4.6–6.5)

## 2013-09-13 ENCOUNTER — Encounter: Payer: Self-pay | Admitting: Internal Medicine

## 2013-09-23 ENCOUNTER — Other Ambulatory Visit: Payer: Self-pay | Admitting: Internal Medicine

## 2013-09-25 ENCOUNTER — Other Ambulatory Visit: Payer: Self-pay | Admitting: Internal Medicine

## 2013-09-25 DIAGNOSIS — I10 Essential (primary) hypertension: Secondary | ICD-10-CM

## 2013-09-25 MED ORDER — CARVEDILOL 3.125 MG PO TABS: 3.1250 mg | ORAL_TABLET | Freq: Two times a day (BID) | ORAL | Status: DC

## 2013-09-25 NOTE — Telephone Encounter (Addendum)
Please advise on patient's MyChart request. Patient states he does not want to come into the office unless absolutely necessary and is wanting to know if he can stay off the amlodipine permanently.

## 2013-10-21 ENCOUNTER — Other Ambulatory Visit: Payer: Self-pay

## 2013-10-21 DIAGNOSIS — I10 Essential (primary) hypertension: Secondary | ICD-10-CM

## 2013-10-21 MED ORDER — SPIRONOLACTONE 25 MG PO TABS: 25.0000 mg | ORAL_TABLET | Freq: Every day | ORAL | Status: DC

## 2013-10-21 MED ORDER — IRBESARTAN 300 MG PO TABS: ORAL_TABLET | ORAL | Status: DC

## 2013-11-19 ENCOUNTER — Other Ambulatory Visit: Payer: Self-pay | Admitting: Internal Medicine

## 2014-01-29 ENCOUNTER — Other Ambulatory Visit: Payer: Self-pay

## 2014-01-29 MED ORDER — SIMVASTATIN 40 MG PO TABS: 40.0000 mg | ORAL_TABLET | Freq: Every day | ORAL | Status: DC

## 2014-02-26 ENCOUNTER — Ambulatory Visit (INDEPENDENT_AMBULATORY_CARE_PROVIDER_SITE_OTHER): Payer: 59 | Admitting: Cardiology

## 2014-02-26 ENCOUNTER — Encounter: Payer: Self-pay | Admitting: Cardiology

## 2014-02-26 VITALS — BP 118/72 | HR 55 | Ht 70.0 in | Wt 194.0 lb

## 2014-02-26 DIAGNOSIS — I10 Essential (primary) hypertension: Secondary | ICD-10-CM

## 2014-02-26 DIAGNOSIS — I2510 Atherosclerotic heart disease of native coronary artery without angina pectoris: Secondary | ICD-10-CM

## 2014-02-26 DIAGNOSIS — E785 Hyperlipidemia, unspecified: Secondary | ICD-10-CM

## 2014-02-26 DIAGNOSIS — E119 Type 2 diabetes mellitus without complications: Secondary | ICD-10-CM

## 2014-02-26 NOTE — Patient Instructions (Signed)
We will schedule you for a nuclear stress test  Continue with your risk factor modification.  I will plan on seeing you in one year.

## 2014-02-26 NOTE — Progress Notes (Signed)
[name] [name] Date of Birth: 1950-11-14 Medical Record #[id number]  History of Present Illness: [name] [name] is seen for cardiac followup today. He has a history of coronary disease diagnosed by cardiac CTA in 2007. He was noted to have moderate plaque in the LAD and diagonal distribution. Calcium score was high. It is estimated at 50% LAD lesion proximally. He has had followup stress tests which have been normal. His last stress test in August 2012 was normal. He was able to exercise for 8 minutes.  He does have diabetes, hyperlipidemia, and hypertension. He states he walks some. Occ. Vague chest pain that is relieved with antacids. BP controlled. No claudication.  Current Outpatient Prescriptions on File Prior to Visit  Medication Sig Dispense Refill  . carvedilol (COREG) 3.125 MG tablet TAKE ONE TABLET BY MOUTH TWICE DAILY WITH MEALS  60 tablet  5  . clopidogrel (PLAVIX) 75 MG tablet Take 1 tablet (75 mg total) by mouth daily.  90 tablet  3  . irbesartan (AVAPRO) 300 MG tablet TAKE ONE TABLET BY MOUTH ONCE DAILY  90 tablet  1  . metFORMIN (GLUCOPHAGE) 500 MG tablet TAKE ONE TABLET BY MOUTH TWICE DAILY WITH MEALS  180 tablet  0  . ONE TOUCH ULTRA TEST test strip Check blood sugar once daily  100 each  12  . ONETOUCH DELICA LANCETS FINE MISC 1 application by Does not apply route daily.  100 each  12  . simvastatin (ZOCOR) 40 MG tablet Take 1 tablet (40 mg total) by mouth at bedtime.  90 tablet  1  . spironolactone (ALDACTONE) 25 MG tablet Take 1 tablet (25 mg total) by mouth daily.  30 tablet  5   No current facility-administered medications on file prior to visit.    Allergies  Allergen Reactions  . Aspirin     Angioedema  . Norvasc [Amlodipine Besylate]     09/12/2013 gingival hyperplasia , Dr Geralynn Ochs ,DDS    Past Medical History  Diagnosis Date  . GERD (gastroesophageal reflux disease)     esophageal stricture, pmh  . HTN (hypertension)   . HLD (hyperlipidemia)   . DM2 (diabetes  mellitus, type 2)   . Amblyopia   . CAD (coronary artery disease)     cardiac CT with a 50% LAD lesion in 2007  . Hypopotassemia   . Esophageal stricture 2003  . Diverticulosis   . Adenomatous colon polyp 08/10/04  . Elevated LFTs     secondary to labetalol  . Hiatal hernia 01/04/00  . External hemorrhoids 2012    lanced by Dr Rise Patience  . Diabetes mellitus     Past Surgical History  Procedure Laterality Date  . Esophagogastroduodenoscopy  2003    ERD, stricture dilated  . Colonoscopy  2006    tics , ADENOMATOUS polyps. F/U due 2011  . Laminectomy  2011    L4 , Dr Sherwood Gambler  . Catherterization  2007  . Colonoscopy  2012    Tics    History  Smoking status  . Never Smoker   Smokeless tobacco  . Never Used    History  Alcohol Use  . 5.0 oz/week  . 10 drink(s) per week    Comment:  3-4 drinks / week    Family History  Problem Relation Age of Onset  . Heart attack Mother 78  . Stroke Mother 81  . Prostate cancer Father   . Diabetes Father   . Heart disease Father 73    CBAG   .  Prostate cancer Father   . Diabetes Brother   . Breast cancer Maternal Aunt   . Breast cancer Maternal Aunt     breast  . Heart attack Maternal Grandfather 61  . Diabetes Paternal Grandmother   . Stroke Paternal Grandfather 43    Review of Systems: The review of systems is positive for recent back pain. He states this is the first pain he said since he had back surgery 3 years ago. He has some arthralgias in his ankles. All other systems were reviewed and are negative.  Physical Exam: BP 118/72  Pulse 55  Ht 5\' 10"  (1.778 m)  Wt 194 lb (87.998 kg)  BMI 27.84 kg/m2 He is a pleasant white male in no acute distress. He is overweight. HEENT: Normal. Neck is supple without JVD, adenopathy, thyromegaly, or bruits. Lungs: Clear Cardiovascular: Regular rate and rhythm. Normal S1 and S2. No gallop, murmur, or click. Abdomen: Obese, soft, and nontender. No hepatosplenomegaly. No masses  or bruits. Extremities: No cyanosis or edema. Pulses are 2+ and symmetric throughout. Skin: Warm and dry Neuro: Alert and oriented x3. Cranial nerves II through XII are intact. No focal motor or sensory deficits.  LABORATORY DATA: Lab Results  Component Value Date   WBC 9.2 12/25/2012   HGB 15.6 12/25/2012   HCT 46.2 12/25/2012   PLT 240.0 12/25/2012   GLUCOSE 124* 08/26/2013   CHOL 169 12/25/2012   TRIG 99.0 12/25/2012   HDL 46.40 12/25/2012   LDLCALC 103* 12/25/2012   ALT 27 03/17/2011   AST 22 03/17/2011   NA 137 08/26/2013   K 3.7 08/26/2013   CL 102 08/26/2013   CREATININE 0.9 08/26/2013   BUN 14 08/26/2013   CO2 28 08/26/2013   TSH 0.80 12/25/2012   PSA 1.17 12/25/2012   HGBA1C 6.9* 08/26/2013   MICROALBUR 1.2 08/26/2013   ECG today is normal.  Assessment / Plan: 1. Coronary disease with nonobstructive LAD disease by coronary CTA in 2007. Patient is asymptomatic. He had a normal stress test in August 2012. At this point I recommended risk factor modification. Continue Plavix. Recommend followup stress myoview at this time.  2. Hyperlipidemia. Goal LDL of less than 100 with optional goal of less than or equal to 70. Currently on Zocor. Labs followed by primary. Room for improvement with diet and weight loss.  3. Diabetes mellitus type 2.  4. Hypertension-controlled.

## 2014-03-04 ENCOUNTER — Other Ambulatory Visit: Payer: Self-pay | Admitting: Cardiology

## 2014-03-06 ENCOUNTER — Telehealth (HOSPITAL_COMMUNITY): Payer: Self-pay

## 2014-03-06 NOTE — Telephone Encounter (Signed)
Encounter complete. 

## 2014-03-07 ENCOUNTER — Telehealth (HOSPITAL_COMMUNITY): Payer: Self-pay

## 2014-03-07 NOTE — Telephone Encounter (Signed)
Encounter complete. 

## 2014-03-11 ENCOUNTER — Ambulatory Visit (HOSPITAL_COMMUNITY)
Admission: RE | Admit: 2014-03-11 | Discharge: 2014-03-11 | Disposition: A | Discharge status: Home / Self Care | Payer: 59 | Source: Ambulatory Visit | Attending: Internal Medicine | Admitting: Internal Medicine

## 2014-03-11 ENCOUNTER — Telehealth: Payer: Self-pay

## 2014-03-11 DIAGNOSIS — E785 Hyperlipidemia, unspecified: Secondary | ICD-10-CM | POA: Insufficient documentation

## 2014-03-11 DIAGNOSIS — I2510 Atherosclerotic heart disease of native coronary artery without angina pectoris: Secondary | ICD-10-CM

## 2014-03-11 DIAGNOSIS — I10 Essential (primary) hypertension: Secondary | ICD-10-CM

## 2014-03-11 DIAGNOSIS — E119 Type 2 diabetes mellitus without complications: Secondary | ICD-10-CM

## 2014-03-11 MED ORDER — TECHNETIUM TC 99M SESTAMIBI GENERIC - CARDIOLITE
10.0000 | Freq: Once | INTRAVENOUS | Status: AC | PRN
  Administered 2014-03-11: 10 via INTRAVENOUS

## 2014-03-11 MED ORDER — TECHNETIUM TC 99M SESTAMIBI GENERIC - CARDIOLITE
30.0000 | Freq: Once | INTRAVENOUS | Status: AC | PRN
  Administered 2014-03-11: 30 via INTRAVENOUS

## 2014-03-11 NOTE — Procedures (Addendum)
[facility] [address] [place] [address] [phone]  Cardiology Nuclear Med Study  [name] [name] is a 63 y.o. male     MRN : [id number]     DOB: 28-Jan-1951  Procedure Date: 03/11/2014  Nuclear Med Background Indication for Stress Test:  Evaluation for Ischemia History:  CAD;CTA-2007/NONOBSTRUCTIVE;No prior NUC MPI for comparison;ETT on 02/24/2011-normal. Cardiac Risk Factors: Family History - CAD, IDDM Type 1, Lipids, NIDDM and Overweight  Symptoms:  Fatigue   Nuclear Pre-Procedure Caffeine/Decaff Intake:  9:00pm NPO After: 7:00am   IV Site: R Forearm  IV 0.9% NS with Angio Cath:  22g  Chest Size (in):  48"  IV Started by: Rolene Course, RN  Height: 5\' 10"  (1.778 m)  Cup Size: n/a  BMI:  Body mass index is 27.84 kg/(m^2). Weight:  194 lb (87.998 kg)   Tech Comments:  n/a    Nuclear Med Study 1 or 2 day study: 1 day  Stress Test Type:  Stress  Order Authorizing Provider:  [name], MD   Resting Radionuclide: Technetium 57m Sestamibi  Resting Radionuclide Dose: 11.0 mCi   Stress Radionuclide:  Technetium 73m Sestamibi  Stress Radionuclide Dose: 32.9 mCi           Stress Protocol Rest HR: 56 Stress HR: 136  Rest BP: 120/81 Stress BP: 174/83  Exercise Time (min): 11:01 METS: 11.80   Predicted Max HR: 156 bpm % Max HR: 87.18 bpm Rate Pressure Product: [phone]  Dose of Adenosine (mg):  n/a Dose of Lexiscan: n/a mg  Dose of Atropine (mg): n/a Dose of Dobutamine: n/a mcg/kg/min (at max HR)  Stress Test Technologist: Mellody Memos, CCT Nuclear Technologist: Otho Perl, CNMT   Rest Procedure:  Myocardial perfusion imaging was performed at rest 45 minutes following the intravenous administration of Technetium 69m Sestamibi. Stress Procedure:  The patient performed treadmill exercise using a Bruce  Protocol for 11 minutes 1 second. The patient stopped due to fatigue and shortness of breath. Patient denied  any chest pain.  There were no significant ST-T wave changes.  Technetium 64m Sestamibi was injected IV at peak exercise and myocardial perfusion imaging was performed after a brief delay.  Transient Ischemic Dilatation (Normal <1.22):  0.85 QGS EDV:  75 ml QGS ESV:  35 ml LV Ejection Fraction: 53%  Rest ECG: NSR - Normal EKG  Stress ECG: Insignificant upsloping ST segment depression.  QPS Raw Data Images:  Normal; no motion artifact; normal heart/lung ratio. Stress Images:  There is decreased uptake in the anterior wall. Rest Images:  There is decreased uptake in the anterior wall. Subtraction (SDS):  These findings are consistent with ischemia.  Impression Exercise Capacity:  Excellent exercise capacity. BP Response:  Hypertensive blood pressure response. Clinical Symptoms:  No significant symptoms noted. ECG Impression:  Insignificant upsloping ST segment depression. Comparison with Prior Nuclear Study: Prior study in 2012 was non-ischemic  Overall Impression:  High risk stress nuclear study with large area of partially reversible anterior ischemia (SDS 7), concerninig for LAD territory ischemia. Despite this, he had excellent exercise tolerance and denied any chest pain with exertion.  LV Wall Motion:  LVEF 53% - anterior and anteroseptal hypokinesis  Pixie Casino, MD, [facility] Board Certified in Nuclear Cardiology Attending Cardiologist [place], MD  03/11/2014 12:18 PM

## 2014-03-11 NOTE — Telephone Encounter (Signed)
Patient called DOD Dr.Hilty read [name] [name] done today.Myoview abnormal he advised needs appointment with Dr.Jordan.Appointment scheduled with Dr.Jordan 03/18/14 at 4:30 pm.Patient stated he is not having any chest pain. Advised to go to ER if has chest pain.

## 2014-03-12 ENCOUNTER — Other Ambulatory Visit: Payer: Self-pay

## 2014-03-12 DIAGNOSIS — I2510 Atherosclerotic heart disease of native coronary artery without angina pectoris: Secondary | ICD-10-CM

## 2014-03-12 DIAGNOSIS — R9439 Abnormal result of other cardiovascular function study: Secondary | ICD-10-CM

## 2014-03-18 ENCOUNTER — Ambulatory Visit: Payer: 59 | Admitting: Cardiology

## 2014-03-18 ENCOUNTER — Other Ambulatory Visit: Payer: Self-pay | Admitting: Cardiology

## 2014-03-18 DIAGNOSIS — R9439 Abnormal result of other cardiovascular function study: Secondary | ICD-10-CM

## 2014-03-19 ENCOUNTER — Other Ambulatory Visit (INDEPENDENT_AMBULATORY_CARE_PROVIDER_SITE_OTHER): Payer: 59

## 2014-03-19 ENCOUNTER — Ambulatory Visit (INDEPENDENT_AMBULATORY_CARE_PROVIDER_SITE_OTHER): Payer: 59 | Admitting: Internal Medicine

## 2014-03-19 ENCOUNTER — Encounter: Payer: Self-pay | Admitting: Internal Medicine

## 2014-03-19 ENCOUNTER — Ambulatory Visit: Payer: 59

## 2014-03-19 VITALS — BP 140/86 | HR 53 | Temp 98.3°F | Resp 14 | Wt 193.2 lb

## 2014-03-19 DIAGNOSIS — E119 Type 2 diabetes mellitus without complications: Secondary | ICD-10-CM

## 2014-03-19 DIAGNOSIS — E785 Hyperlipidemia, unspecified: Secondary | ICD-10-CM

## 2014-03-19 DIAGNOSIS — Z23 Encounter for immunization: Secondary | ICD-10-CM

## 2014-03-19 DIAGNOSIS — I10 Essential (primary) hypertension: Secondary | ICD-10-CM

## 2014-03-19 DIAGNOSIS — R9439 Abnormal result of other cardiovascular function study: Secondary | ICD-10-CM

## 2014-03-19 DIAGNOSIS — D2330 Other benign neoplasm of skin of unspecified part of face: Secondary | ICD-10-CM

## 2014-03-19 DIAGNOSIS — I2510 Atherosclerotic heart disease of native coronary artery without angina pectoris: Secondary | ICD-10-CM

## 2014-03-19 DIAGNOSIS — D2239 Melanocytic nevi of other parts of face: Secondary | ICD-10-CM

## 2014-03-19 LAB — BASIC METABOLIC PANEL
BUN: 13 mg/dL (ref 6–23)
BUN: 13 mg/dL (ref 6–23)
CO2: 28 meq/L (ref 19–32)
CO2: 29 mEq/L (ref 19–32)
CREATININE: 1 mg/dL (ref 0.4–1.5)
Calcium: 9.2 mg/dL (ref 8.4–10.5)
Calcium: 9.3 mg/dL (ref 8.4–10.5)
Chloride: 104 mEq/L (ref 96–112)
Chloride: 104 mEq/L (ref 96–112)
Creatinine, Ser: 1 mg/dL (ref 0.4–1.5)
GFR: 80.85 mL/min (ref >60.00)
GFR: 84.79 mL/min (ref >60.00)
GLUCOSE: 130 mg/dL — AB (ref 70–99)
Glucose, Bld: 128 mg/dL — ABNORMAL HIGH (ref 70–99)
POTASSIUM: 4.1 meq/L (ref 3.5–5.1)
Potassium: 4.1 mEq/L (ref 3.5–5.1)
SODIUM: 139 meq/L (ref 135–145)
Sodium: 139 mEq/L (ref 135–145)

## 2014-03-19 LAB — HEPATIC FUNCTION PANEL
ALBUMIN: 4.1 g/dL (ref 3.5–5.2)
ALT: 18 U/L (ref 0–53)
AST: 15 U/L (ref 0–37)
Alkaline Phosphatase: 62 U/L (ref 39–117)
BILIRUBIN DIRECT: 0.2 mg/dL (ref 0.0–0.3)
Total Bilirubin: 0.8 mg/dL (ref 0.2–1.2)
Total Protein: 7.3 g/dL (ref 6.0–8.3)

## 2014-03-19 LAB — CBC WITH DIFFERENTIAL/PLATELET
Basophils Absolute: 0 10*3/uL (ref 0.0–0.1)
Basophils Relative: 0.3 % (ref 0.0–3.0)
Eosinophils Absolute: 0.3 10*3/uL (ref 0.0–0.7)
Eosinophils Relative: 3.6 % (ref 0.0–5.0)
HCT: 45 % (ref 39.0–52.0)
Hemoglobin: 15.1 g/dL (ref 13.0–17.0)
Lymphocytes Relative: 16.7 % (ref 12.0–46.0)
Lymphs Abs: 1.3 10*3/uL (ref 0.7–4.0)
MCHC: 33.7 g/dL (ref 30.0–36.0)
MCV: 98.1 fl (ref 78.0–100.0)
MONOS PCT: 5.8 % (ref 3.0–12.0)
Monocytes Absolute: 0.4 10*3/uL (ref 0.1–1.0)
Neutro Abs: 5.6 10*3/uL (ref 1.4–7.7)
Neutrophils Relative %: 73.6 % (ref 43.0–77.0)
PLATELETS: 241 10*3/uL (ref 150.0–400.0)
RBC: 4.59 Mil/uL (ref 4.22–5.81)
RDW: 13.9 % (ref 11.5–15.5)
WBC: 7.6 10*3/uL (ref 4.0–10.5)

## 2014-03-19 LAB — LIPID PANEL
CHOL/HDL RATIO: 4
CHOLESTEROL: 144 mg/dL (ref 0–200)
HDL: 38.3 mg/dL — AB (ref >39.00)
LDL Cholesterol: 85 mg/dL (ref 0–99)
NonHDL: 105.7
TRIGLYCERIDES: 106 mg/dL (ref 0.0–149.0)
VLDL: 21.2 mg/dL (ref 0.0–40.0)

## 2014-03-19 LAB — HEMOGLOBIN A1C: Hgb A1c MFr Bld: 6.6 % — ABNORMAL HIGH (ref 4.6–6.5)

## 2014-03-19 LAB — MICROALBUMIN / CREATININE URINE RATIO
Creatinine,U: 160.4 mg/dL
MICROALB/CREAT RATIO: 0.5 mg/g (ref 0.0–30.0)
Microalb, Ur: 0.8 mg/dL (ref 0.0–1.9)

## 2014-03-19 LAB — PROTIME-INR
INR: 1 ratio (ref 0.8–1.0)
PROTHROMBIN TIME: 10.7 s (ref 9.6–13.1)

## 2014-03-19 LAB — TSH: TSH: 1.09 u[IU]/mL (ref 0.35–4.50)

## 2014-03-19 NOTE — Progress Notes (Signed)
Subjective:    Patient ID: [name] [name], male    DOB: 19-Oct-1950, 62 y.o.   MRN: [id number]  HPI  He is here to assess status of active health conditions:  Last A1c was 6.9% on 08/26/13. This would correlate with an average sugar of 168 and 38% increased long-term cardiovascular risk.  His  LDL was 103 and HDL 46.4 on 12/25/12. Statin compliant; no adverse effects.  FBS range/ average: checked irregularly (1-2 X/ week) 100-120 2 hour post meal glucose? In 120s Hypoglycemia not reported Ophthalmologic exam is current (01/15) ;no retinopathy present. Foot care not current Diet is low sugar , low carb Exercise  30-60 minutes 2-3 times per week as walking w/o symptoms.Myoview abnormal ; cath pending.       Review of Systems   Polyuria, polyphagia, polydipsia absent. There is no blurred vision, double vision, or loss of vision.  Also denied are numbness, tingling, or burning of the extremities. No nonhealing skin lesions present. Weight is stable.    Chest pain, palpitations, tachycardia, exertional dyspnea, paroxysmal nocturnal dyspnea, claudication or edema are absent.   He denies any abdominal symptoms or myalgias. Some short term memory issues.  He is concerned about hyperpigmented area on his nose at the site of a previous nevus removal  He has intermittent pruritic scalp lesions.     Objective:   Physical Exam    Pertinent or positive findings include: He has a 5 x 4 mm lesion over the nose which is slightly hyperpigmented except for slight decrease @ its equator. He has scattered folliculitis-type scalp lesions without pustules or vesicles.. Heart rhythm and rate are slow  & regular. Ventral hernia present Significant fungal nail changes of the great toenails bilaterally.  Gen.: Healthy and well-nourished in appearance. Alert, appropriate and cooperative throughout exam. Appears younger than stated age  Head: Normocephalic without obvious abnormalities; no alopecia    Eyes: No corneal or conjunctival inflammation noted. Pupils equal round reactive to light and accommodation.  Ears: External  ear exam reveals no significant lesions or deformities.  Hearing is grossly normal bilaterally. Nose: External nasal exam reveals no deformity or inflammation. Nasal mucosa are pink and moist. No lesions or exudates noted.   Mouth: Oral mucosa and oropharynx reveal no lesions or exudates. Teeth in good repair. Neck: No deformities, masses, or tenderness noted.  Thyroid normal. Lungs: Normal respiratory effort; chest expands symmetrically. Lungs are clear to auscultation without rales, wheezes, or increased work of breathing. Heart:  Normal S1 and S2. No gallop, click, or rub. No  murmur. Abdomen: Bowel sounds normal; abdomen soft and nontender. No masses or organomegaly noted. No clubbing, cyanosis, edema, or significant extremity  deformity noted. Range of motion normal .Tone & strength normal. Hand joints normal Fingernail  health good. Able to lie down & sit up w/o help. Negative SLR bilaterally Vascular: Carotid, radial artery, dorsalis pedis and  posterior tibial pulses are full and equal. No bruits present. Neurologic: Alert and oriented x3. Deep tendon reflexes symmetrical and normal.   Skin: Intact without suspicious lesions . Lymph: No cervical, axillary lymphadenopathy present. Psych: Mood and affect are normal. Normally interactive  Assessment & Plan:  #1 See Current Assessment & Plan in Problem List under specific Diagnosis #2 scalp dermatitis #3 nevus nose See orders & AVS

## 2014-03-19 NOTE — Assessment & Plan Note (Signed)
A1c , urine microalbumin, BMET 

## 2014-03-19 NOTE — Patient Instructions (Signed)
Your next office appointment will be determined based upon review of your pending labs . Those instructions will be transmitted to you through My Chart   Use T-Gel , a coal tar shampoo, one to  2 times per week. This will have an antibacterial effect on scalp lesions.  Minimal Blood Pressure Goal= AVERAGE < 140/90;  Ideal is an AVERAGE < 135/85. This AVERAGE should be calculated from @ least 5-7 BP readings taken @ different times of day on different days of week. You should not respond to isolated BP readings , but rather the AVERAGE for that week .Please bring your  blood pressure cuff to office visits to verify that it is reliable.It  can also be checked against the blood pressure device at the pharmacy. Finger or wrist cuffs are not dependable; an arm cuff is.

## 2014-03-19 NOTE — Assessment & Plan Note (Signed)
Lipids, LFTs, TSH  

## 2014-03-19 NOTE — Progress Notes (Signed)
Pre visit review using our clinic review tool, if applicable. No additional management support is needed unless otherwise documented below in the visit note. 

## 2014-03-19 NOTE — Assessment & Plan Note (Signed)
BMET BP goals discussed 

## 2014-03-20 ENCOUNTER — Ambulatory Visit
Admission: RE | Admit: 2014-03-20 | Discharge: 2014-03-20 | Disposition: A | Discharge status: Home / Self Care | Payer: 59 | Source: Ambulatory Visit | Attending: Cardiology | Admitting: Cardiology

## 2014-03-20 DIAGNOSIS — I2510 Atherosclerotic heart disease of native coronary artery without angina pectoris: Secondary | ICD-10-CM

## 2014-03-20 DIAGNOSIS — R9439 Abnormal result of other cardiovascular function study: Secondary | ICD-10-CM

## 2014-03-21 ENCOUNTER — Encounter (HOSPITAL_COMMUNITY): Payer: Self-pay

## 2014-03-26 ENCOUNTER — Encounter (HOSPITAL_COMMUNITY)
Admission: RE | Disposition: A | Discharge status: Home / Self Care | Payer: Self-pay | Source: Ambulatory Visit | Attending: Cardiology

## 2014-03-26 ENCOUNTER — Ambulatory Visit (HOSPITAL_COMMUNITY)
Admission: RE | Admit: 2014-03-26 | Discharge: 2014-03-26 | Disposition: A | Discharge status: Home / Self Care | Payer: 59 | Source: Ambulatory Visit | Attending: Cardiology | Admitting: Cardiology

## 2014-03-26 ENCOUNTER — Other Ambulatory Visit: Payer: Self-pay | Admitting: Cardiology

## 2014-03-26 ENCOUNTER — Encounter (HOSPITAL_COMMUNITY): Payer: Self-pay | Admitting: Pharmacy Technician

## 2014-03-26 ENCOUNTER — Telehealth: Payer: Self-pay | Admitting: Cardiology

## 2014-03-26 DIAGNOSIS — E785 Hyperlipidemia, unspecified: Secondary | ICD-10-CM | POA: Insufficient documentation

## 2014-03-26 DIAGNOSIS — Z886 Allergy status to analgesic agent status: Secondary | ICD-10-CM | POA: Insufficient documentation

## 2014-03-26 DIAGNOSIS — K219 Gastro-esophageal reflux disease without esophagitis: Secondary | ICD-10-CM | POA: Diagnosis not present

## 2014-03-26 DIAGNOSIS — Z7902 Long term (current) use of antithrombotics/antiplatelets: Secondary | ICD-10-CM | POA: Insufficient documentation

## 2014-03-26 DIAGNOSIS — R9439 Abnormal result of other cardiovascular function study: Secondary | ICD-10-CM

## 2014-03-26 DIAGNOSIS — E119 Type 2 diabetes mellitus without complications: Secondary | ICD-10-CM | POA: Diagnosis not present

## 2014-03-26 DIAGNOSIS — I2510 Atherosclerotic heart disease of native coronary artery without angina pectoris: Secondary | ICD-10-CM | POA: Diagnosis not present

## 2014-03-26 DIAGNOSIS — I10 Essential (primary) hypertension: Secondary | ICD-10-CM | POA: Diagnosis not present

## 2014-03-26 HISTORY — PX: LEFT HEART CATHETERIZATION WITH CORONARY ANGIOGRAM: SHX5451

## 2014-03-26 LAB — GLUCOSE, CAPILLARY: Glucose-Capillary: 123 mg/dL — ABNORMAL HIGH (ref 70–99)

## 2014-03-26 SURGERY — LEFT HEART CATHETERIZATION WITH CORONARY ANGIOGRAM
Anesthesia: LOCAL

## 2014-03-26 MED ORDER — TICAGRELOR 90 MG PO TABS: 90.0000 mg | ORAL_TABLET | Freq: Two times a day (BID) | ORAL | Status: DC

## 2014-03-26 MED ORDER — SODIUM CHLORIDE 0.9 % IJ SOLN: 3.0000 mL | Freq: Two times a day (BID) | INTRAMUSCULAR | Status: DC

## 2014-03-26 MED ORDER — ATORVASTATIN CALCIUM 80 MG PO TABS: 80.0000 mg | ORAL_TABLET | Freq: Every day | ORAL | Status: DC

## 2014-03-26 MED ORDER — LIDOCAINE HCL (PF) 1 % IJ SOLN
INTRAMUSCULAR | Status: AC
  Filled 2014-03-26: qty 30

## 2014-03-26 MED ORDER — SODIUM CHLORIDE 0.9 % IV SOLN
1.0000 mL/kg/h | INTRAVENOUS | Status: DC
Start: 2014-03-26 — End: 2014-03-26

## 2014-03-26 MED ORDER — SODIUM CHLORIDE 0.9 % IV SOLN: 250.0000 mL | INTRAVENOUS | Status: DC | PRN

## 2014-03-26 MED ORDER — HEPARIN (PORCINE) IN NACL 2-0.9 UNIT/ML-% IJ SOLN
INTRAMUSCULAR | Status: AC
  Filled 2014-03-26: qty 1000

## 2014-03-26 MED ORDER — SODIUM CHLORIDE 0.9 % IJ SOLN: 3.0000 mL | INTRAMUSCULAR | Status: DC | PRN

## 2014-03-26 MED ORDER — VERAPAMIL HCL 2.5 MG/ML IV SOLN
INTRAVENOUS | Status: AC
  Filled 2014-03-26: qty 2

## 2014-03-26 MED ORDER — METFORMIN HCL 500 MG PO TABS: 500.0000 mg | ORAL_TABLET | Freq: Two times a day (BID) | ORAL | Status: DC

## 2014-03-26 MED ORDER — FENTANYL CITRATE 0.05 MG/ML IJ SOLN
INTRAMUSCULAR | Status: AC
  Filled 2014-03-26: qty 2

## 2014-03-26 MED ORDER — SODIUM CHLORIDE 0.9 % IV SOLN
INTRAVENOUS | Status: DC
  Administered 2014-03-26: 09:00:00 via INTRAVENOUS

## 2014-03-26 MED ORDER — MIDAZOLAM HCL 2 MG/2ML IJ SOLN
INTRAMUSCULAR | Status: AC
  Filled 2014-03-26: qty 2

## 2014-03-26 MED ORDER — HEPARIN SODIUM (PORCINE) 1000 UNIT/ML IJ SOLN
INTRAMUSCULAR | Status: AC
  Filled 2014-03-26: qty 1

## 2014-03-26 NOTE — H&P (View-Only) (Signed)
[name] [name] Date of Birth: 25-Jul-1950 Medical Record #[id number]  History of Present Illness: [name] [name] is seen for cardiac followup today. He has a history of coronary disease diagnosed by cardiac CTA in 2007. He was noted to have moderate plaque in the LAD and diagonal distribution. Calcium score was high. It is estimated at 50% LAD lesion proximally. He has had followup stress tests which have been normal. His last stress test in August 2012 was normal. He was able to exercise for 8 minutes.  He does have diabetes, hyperlipidemia, and hypertension. He states he walks some. Occ. Vague chest pain that is relieved with antacids. BP controlled. No claudication.  Current Outpatient Prescriptions on File Prior to Visit  Medication Sig Dispense Refill  . carvedilol (COREG) 3.125 MG tablet TAKE ONE TABLET BY MOUTH TWICE DAILY WITH MEALS  60 tablet  5  . clopidogrel (PLAVIX) 75 MG tablet Take 1 tablet (75 mg total) by mouth daily.  90 tablet  3  . irbesartan (AVAPRO) 300 MG tablet TAKE ONE TABLET BY MOUTH ONCE DAILY  90 tablet  1  . metFORMIN (GLUCOPHAGE) 500 MG tablet TAKE ONE TABLET BY MOUTH TWICE DAILY WITH MEALS  180 tablet  0  . ONE TOUCH ULTRA TEST test strip Check blood sugar once daily  100 each  12  . ONETOUCH DELICA LANCETS FINE MISC 1 application by Does not apply route daily.  100 each  12  . simvastatin (ZOCOR) 40 MG tablet Take 1 tablet (40 mg total) by mouth at bedtime.  90 tablet  1  . spironolactone (ALDACTONE) 25 MG tablet Take 1 tablet (25 mg total) by mouth daily.  30 tablet  5   No current facility-administered medications on file prior to visit.    Allergies  Allergen Reactions  . Aspirin     Angioedema  . Norvasc [Amlodipine Besylate]     09/12/2013 gingival hyperplasia , Dr Geralynn Ochs ,DDS    Past Medical History  Diagnosis Date  . GERD (gastroesophageal reflux disease)     esophageal stricture, pmh  . HTN (hypertension)   . HLD (hyperlipidemia)   . DM2 (diabetes  mellitus, type 2)   . Amblyopia   . CAD (coronary artery disease)     cardiac CT with a 50% LAD lesion in 2007  . Hypopotassemia   . Esophageal stricture 2003  . Diverticulosis   . Adenomatous colon polyp 08/10/04  . Elevated LFTs     secondary to labetalol  . Hiatal hernia 01/04/00  . External hemorrhoids 2012    lanced by Dr Rise Patience  . Diabetes mellitus     Past Surgical History  Procedure Laterality Date  . Esophagogastroduodenoscopy  2003    ERD, stricture dilated  . Colonoscopy  2006    tics , ADENOMATOUS polyps. F/U due 2011  . Laminectomy  2011    L4 , Dr Sherwood Gambler  . Catherterization  2007  . Colonoscopy  2012    Tics    History  Smoking status  . Never Smoker   Smokeless tobacco  . Never Used    History  Alcohol Use  . 5.0 oz/week  . 10 drink(s) per week    Comment:  3-4 drinks / week    Family History  Problem Relation Age of Onset  . Heart attack Mother 6  . Stroke Mother 86  . Prostate cancer Father   . Diabetes Father   . Heart disease Father 38    CBAG   .  Prostate cancer Father   . Diabetes Brother   . Breast cancer Maternal Aunt   . Breast cancer Maternal Aunt     breast  . Heart attack Maternal Grandfather 61  . Diabetes Paternal Grandmother   . Stroke Paternal Grandfather 85    Review of Systems: The review of systems is positive for recent back pain. He states this is the first pain he said since he had back surgery 3 years ago. He has some arthralgias in his ankles. All other systems were reviewed and are negative.  Physical Exam: BP 118/72  Pulse 55  Ht 5\' 10"  (1.778 m)  Wt 194 lb (87.998 kg)  BMI 27.84 kg/m2 He is a pleasant white male in no acute distress. He is overweight. HEENT: Normal. Neck is supple without JVD, adenopathy, thyromegaly, or bruits. Lungs: Clear Cardiovascular: Regular rate and rhythm. Normal S1 and S2. No gallop, murmur, or click. Abdomen: Obese, soft, and nontender. No hepatosplenomegaly. No masses  or bruits. Extremities: No cyanosis or edema. Pulses are 2+ and symmetric throughout. Skin: Warm and dry Neuro: Alert and oriented x3. Cranial nerves II through XII are intact. No focal motor or sensory deficits.  LABORATORY DATA: Lab Results  Component Value Date   WBC 9.2 12/25/2012   HGB 15.6 12/25/2012   HCT 46.2 12/25/2012   PLT 240.0 12/25/2012   GLUCOSE 124* 08/26/2013   CHOL 169 12/25/2012   TRIG 99.0 12/25/2012   HDL 46.40 12/25/2012   LDLCALC 103* 12/25/2012   ALT 27 03/17/2011   AST 22 03/17/2011   NA 137 08/26/2013   K 3.7 08/26/2013   CL 102 08/26/2013   CREATININE 0.9 08/26/2013   BUN 14 08/26/2013   CO2 28 08/26/2013   TSH 0.80 12/25/2012   PSA 1.17 12/25/2012   HGBA1C 6.9* 08/26/2013   MICROALBUR 1.2 08/26/2013   ECG today is normal.  Assessment / Plan: 1. Coronary disease with nonobstructive LAD disease by coronary CTA in 2007. Patient is asymptomatic. He had a normal stress test in August 2012. At this point I recommended risk factor modification. Continue Plavix. Recommend followup stress myoview at this time.  2. Hyperlipidemia. Goal LDL of less than 100 with optional goal of less than or equal to 70. Currently on Zocor. Labs followed by primary. Room for improvement with diet and weight loss.  3. Diabetes mellitus type 2.  4. Hypertension-controlled.

## 2014-03-26 NOTE — Telephone Encounter (Signed)
Form given to [facility] (Dr. Doug Sou nurse) to complete and fax.

## 2014-03-26 NOTE — Telephone Encounter (Signed)
PA number is [phone] ([facility])

## 2014-03-26 NOTE — CV Procedure (Addendum)
    Cardiac Catheterization Procedure Note  Name: [name] [name] MRN: [id number] DOB: 20-Mar-1951  Procedure: Left Heart Cath, Selective Coronary Angiography, LV angiography  Indication: 63 yo WM with history of CAD presents with chest pain. Myoview study is high risk with a large perfusion defect in the anterior wall. The patient does have a history of ASA allergy.   Procedural Details: The right wrist was prepped, draped, and anesthetized with 1% lidocaine. Using the modified Seldinger technique, a 6 French slender sheath was introduced into the right radial artery. 3 mg of verapamil was administered through the sheath, weight-based unfractionated heparin was administered intravenously. Standard Judkins catheters were used for selective coronary angiography and left ventriculography. Catheter exchanges were performed over an exchange length guidewire. There were no immediate procedural complications. A TR band was used for radial hemostasis at the completion of the procedure.  The patient was transferred to the post catheterization recovery area for further monitoring.  Procedural Findings: Hemodynamics: AO 124.62 mean 86 mm Hg LV 122/7 mm Hg  Coronary angiography: Coronary dominance: right  Left mainstem: Normal.   Left anterior descending (LAD): The LAD has a complex, calcified stenosis at the takeoff of the first diagonal up to 90%. The first diagonal is a small to moderate sized vessel with 80-90% proximal stenosis. The diagonal then bifurcates and a very small side branch has 95% stenosis.  Left circumflex (LCx): The LCx gives off a very small OM1 then a large OM2. The OM2 has a 70% stenosis proximally followed by a 50% lesion.  Right coronary artery (RCA): Dominant vessel. There is a 50% lesion in the distal RCA. The PLOM gives off 5 small sub-branches. In the mid PLOM there is a 90-95% stenosis. The vessel here is very small in diameter.  Left ventriculography: Left ventricular  systolic function is abnormal, there is anterior wall hypokinesis.  LVEF is estimated at 50%, there is no significant mitral regurgitation   Final Conclusions:   1. 3 vessel obstructive CAD 2. Low normal LV function.   Recommendations: Will need to consider options for revascularization. This will need to take into account that he is allergic to ASA.   [name], [place]  03/26/2014, 10:33 AM   Addendum: discussed cardiac cath results with family and patient. Case reviewed as well with Dr. Burt Knack. The PLOM branches are small and not amenable to PCI. The LAD is suitable for PCI but with potential to jail the diagonal branch. Will probably treat the diagonal with PTCA +/- stent prior to stenting the LAD. Will assess the LCx disease with flow wire. Given history of ASA allergy will treat with Brilinta 90 mg bid as sole antiplatelet therapy. Will review schedule and plan on PCI next week.  [name] MD, [facility]

## 2014-03-26 NOTE — Telephone Encounter (Signed)
Pt called in stating that [employer] told him that he needed a prior authorization for the Brilinta before getting it filled. He said that the doctor could call [phone]. Mr. Walkowiak would like a call to know that everything went through fine  Thanks

## 2014-03-26 NOTE — Discharge Instructions (Signed)
Radial Site Care Refer to this sheet in the next few weeks. These instructions provide you with information on caring for yourself after your procedure. Your caregiver may also give you more specific instructions. Your treatment has been planned according to current medical practices, but problems sometimes occur. Call your caregiver if you have any problems or questions after your procedure. HOME CARE INSTRUCTIONS  You may shower the day after the procedure.Remove the bandage (dressing) and gently wash the site with plain soap and water.Gently pat the site dry.  Do not apply powder or lotion to the site.  Do not submerge the affected site in water for 3 to 5 days.  Inspect the site at least twice daily.  Do not flex or bend the affected arm for 24 hours.  No lifting over 5 pounds (2.3 kg) for 5 days after your procedure.  Do not drive home if you are discharged the same day of the procedure. Have someone else drive you.  You may drive 24 hours after the procedure unless otherwise instructed by your caregiver.  Do not operate machinery or power tools for 24 hours.  A responsible adult should be with you for the first 24 hours after you arrive home. What to expect:  Any bruising will usually fade within 1 to 2 weeks.  Blood that collects in the tissue (hematoma) may be painful to the touch. It should usually decrease in size and tenderness within 1 to 2 weeks. SEEK IMMEDIATE MEDICAL CARE IF:  You have unusual pain at the radial site.  You have redness, warmth, swelling, or pain at the radial site.  You have drainage (other than a small amount of blood on the dressing).  You have chills.  You have a fever or persistent symptoms for more than 72 hours.  You have a fever and your symptoms suddenly get worse.  Your arm becomes pale, cool, tingly, or numb.  You have heavy bleeding from the site. Hold pressure on the site. Document Released: 07/16/2010 Document Revised:  09/05/2011 Document Reviewed: 07/16/2010 [facility] Patient Information 2015 [place], [place]. This information is not intended to replace advice given to you by your health care provider. Make sure you discuss any questions you have with your health care provider.  Stop Plavix.  Start Brilinta 90 mg twice a day.  Switch simvastatin to atorvastatin 80 mg daily.

## 2014-03-26 NOTE — Interval H&P Note (Signed)
History and Physical Interval Note:  03/26/2014 9:54 AM  [name]  has presented today for surgery, with the diagnosis of Chest Pain  The various methods of treatment have been discussed with the patient and family. After consideration of risks, benefits and other options for treatment, the patient has consented to  Procedure(s): LEFT HEART CATHETERIZATION WITH CORONARY ANGIOGRAM (N/A) as a surgical intervention .  The patient's history has been reviewed, patient examined, no change in status, stable for surgery.  I have reviewed the patient's chart and labs.  Questions were answered to the patient's satisfaction.   Cath Lab Visit (complete for each Cath Lab visit)  Clinical Evaluation Leading to the Procedure:   ACS: No.  Non-ACS:    Anginal Classification: CCS II  Anti-ischemic medical therapy: Minimal Therapy (1 class of medications)  Non-Invasive Test Results: High-risk stress test findings: cardiac mortality >3%/year  Prior CABG: No previous CABG        Collier Salina [facility] 03/26/2014 9:54 AM

## 2014-03-26 NOTE — Telephone Encounter (Signed)
Prior authorization form for brilinta completed and faxed to fax # [phone].

## 2014-03-27 ENCOUNTER — Telehealth: Payer: Self-pay | Admitting: Cardiology

## 2014-03-27 MED ORDER — FAMOTIDINE 10 MG PO TABS: ORAL_TABLET | ORAL | Status: DC

## 2014-03-27 MED ORDER — DIPHENHYDRAMINE HCL 25 MG PO CAPS: 25.0000 mg | ORAL_CAPSULE | Freq: Four times a day (QID) | ORAL | Status: DC | PRN

## 2014-03-27 MED ORDER — PREDNISONE 20 MG PO TABS: ORAL_TABLET | ORAL | Status: DC

## 2014-03-27 NOTE — Telephone Encounter (Signed)
Returned call to patient stated he noticed red rash today after lunch.Spoke to [place] he advised since he just had a cath 03/26/14 contrast dye allergy.Advised take Benadryl 25 mg every 6 hrs until rash is better, take Pepcid 10 mg daily.Advised since he is scheduled for PCI 04/01/14 he will need to take Prednisone 60 mg night before cath and 60 mg morning of cath.

## 2014-03-27 NOTE — Telephone Encounter (Signed)
Pt called again,said he waiting from somebody.Please call today.

## 2014-03-27 NOTE — Telephone Encounter (Signed)
C/o itching waist area under arms etc can see a rash but he feels a rash.  He knows he needs to be on the brilinta until his procedure Tuesday but not sure he cn stand it.  Assured patient [name] [name] will call him once they are through with patients.  Patient voiced understanding.

## 2014-03-27 NOTE — Telephone Encounter (Signed)
[name] [name] is calling because he has started taking the Brilinta and the Atorvastatin he has develop a rash . Please Call   Thanks

## 2014-03-27 NOTE — Telephone Encounter (Signed)
Message deferred to Elly Modena, LPN

## 2014-03-27 NOTE — Telephone Encounter (Signed)
[name] [name] is calling because he has develop a rash since he has started the Brilinta and the Atorvastain .Marland Kitchen Please call   Thanks

## 2014-03-28 ENCOUNTER — Telehealth: Payer: Self-pay | Admitting: Cardiology

## 2014-03-28 NOTE — Telephone Encounter (Signed)
Pt. States he's still having some itching and redness bout it's not as bad as lastnight pt stated he is taking benadryl , pt. Encouraged if it get worse or doesn't get better to go to the ER. Pt. Agreed with plan

## 2014-03-28 NOTE — Telephone Encounter (Signed)
Pt called in stating that he has had a rash since yesterday morning and has been taking Benadryl which he says doesn't seem to help. He would like to know is there something else he could take. Please call  Thanks

## 2014-03-31 ENCOUNTER — Telehealth: Payer: Self-pay | Admitting: Cardiology

## 2014-03-31 NOTE — Telephone Encounter (Signed)
New problem    Pt is having a heart procedure and has a lot of questions. Please call pt.

## 2014-03-31 NOTE — Telephone Encounter (Signed)
Returned call to patient he stated he went to Urgent Care this past Sat 03/29/14 for rash and itching.Stated he was given a cortisone injection and a prednisone dose pack.Stated rash and itching has gone he is better.Stated he started prednisone dose pack yesterday 03/30/14. Stated he wanted to know how to take prednisone 60 mg before cath.Spoke to DOD Dr.Harding he advised ok to take dose pack as prescribed.Advised needs to take pm dose dose pack 6 hours before he takes prednisone 60 mg tonight.Advised take prednisone 60 mg morning of procedure and take benadryl 25 mg,pecid 10 mg.

## 2014-04-01 ENCOUNTER — Encounter (HOSPITAL_COMMUNITY): Payer: Self-pay | Admitting: General Practice

## 2014-04-01 ENCOUNTER — Ambulatory Visit (HOSPITAL_COMMUNITY)
Admission: RE | Admit: 2014-04-01 | Discharge: 2014-04-02 | Disposition: A | Discharge status: Home / Self Care | Payer: 59 | Source: Ambulatory Visit | Attending: Cardiology | Admitting: Cardiology

## 2014-04-01 ENCOUNTER — Telehealth: Payer: Self-pay

## 2014-04-01 ENCOUNTER — Encounter (HOSPITAL_COMMUNITY)
Admission: RE | Disposition: A | Discharge status: Home / Self Care | Payer: 59 | Source: Ambulatory Visit | Attending: Cardiology

## 2014-04-01 DIAGNOSIS — I209 Angina pectoris, unspecified: Secondary | ICD-10-CM | POA: Diagnosis present

## 2014-04-01 DIAGNOSIS — G8929 Other chronic pain: Secondary | ICD-10-CM | POA: Diagnosis not present

## 2014-04-01 DIAGNOSIS — I152 Hypertension secondary to endocrine disorders: Secondary | ICD-10-CM | POA: Diagnosis present

## 2014-04-01 DIAGNOSIS — M545 Low back pain, unspecified: Secondary | ICD-10-CM | POA: Diagnosis present

## 2014-04-01 DIAGNOSIS — Z91041 Radiographic dye allergy status: Secondary | ICD-10-CM | POA: Insufficient documentation

## 2014-04-01 DIAGNOSIS — I2510 Atherosclerotic heart disease of native coronary artery without angina pectoris: Secondary | ICD-10-CM

## 2014-04-01 DIAGNOSIS — E119 Type 2 diabetes mellitus without complications: Secondary | ICD-10-CM

## 2014-04-01 DIAGNOSIS — Z7902 Long term (current) use of antithrombotics/antiplatelets: Secondary | ICD-10-CM | POA: Insufficient documentation

## 2014-04-01 DIAGNOSIS — K219 Gastro-esophageal reflux disease without esophagitis: Secondary | ICD-10-CM | POA: Diagnosis not present

## 2014-04-01 DIAGNOSIS — I10 Essential (primary) hypertension: Secondary | ICD-10-CM | POA: Diagnosis not present

## 2014-04-01 DIAGNOSIS — E785 Hyperlipidemia, unspecified: Secondary | ICD-10-CM | POA: Diagnosis not present

## 2014-04-01 DIAGNOSIS — E669 Obesity, unspecified: Secondary | ICD-10-CM | POA: Insufficient documentation

## 2014-04-01 DIAGNOSIS — I200 Unstable angina: Secondary | ICD-10-CM

## 2014-04-01 DIAGNOSIS — I25119 Atherosclerotic heart disease of native coronary artery with unspecified angina pectoris: Secondary | ICD-10-CM | POA: Insufficient documentation

## 2014-04-01 DIAGNOSIS — I25110 Atherosclerotic heart disease of native coronary artery with unstable angina pectoris: Secondary | ICD-10-CM | POA: Diagnosis present

## 2014-04-01 DIAGNOSIS — Z6827 Body mass index (BMI) 27.0-27.9, adult: Secondary | ICD-10-CM | POA: Diagnosis not present

## 2014-04-01 DIAGNOSIS — Z79899 Other long term (current) drug therapy: Secondary | ICD-10-CM | POA: Diagnosis not present

## 2014-04-01 DIAGNOSIS — Z955 Presence of coronary angioplasty implant and graft: Secondary | ICD-10-CM

## 2014-04-01 DIAGNOSIS — E1159 Type 2 diabetes mellitus with other circulatory complications: Secondary | ICD-10-CM | POA: Diagnosis present

## 2014-04-01 DIAGNOSIS — E1169 Type 2 diabetes mellitus with other specified complication: Secondary | ICD-10-CM | POA: Diagnosis present

## 2014-04-01 HISTORY — PX: CORONARY ANGIOPLASTY WITH STENT PLACEMENT: SHX49

## 2014-04-01 HISTORY — PX: PERCUTANEOUS CORONARY STENT INTERVENTION (PCI-S): SHX5485

## 2014-04-01 HISTORY — DX: Family history of other specified conditions: Z84.89

## 2014-04-01 LAB — BASIC METABOLIC PANEL
Anion gap: 13 (ref 5–15)
BUN: 18 mg/dL (ref 6–23)
CHLORIDE: 104 meq/L (ref 96–112)
CO2: 24 meq/L (ref 19–32)
CREATININE: 0.86 mg/dL (ref 0.50–1.35)
Calcium: 9.5 mg/dL (ref 8.4–10.5)
GFR calc Af Amer: >90 mL/min (ref >90)
GFR calc non Af Amer: 90 mL/min — ABNORMAL LOW (ref >90)
Glucose, Bld: 168 mg/dL — ABNORMAL HIGH (ref 70–99)
Potassium: 4 mEq/L (ref 3.7–5.3)
Sodium: 141 mEq/L (ref 137–147)

## 2014-04-01 LAB — GLUCOSE, CAPILLARY
GLUCOSE-CAPILLARY: 223 mg/dL — AB (ref 70–99)
Glucose-Capillary: 140 mg/dL — ABNORMAL HIGH (ref 70–99)
Glucose-Capillary: 138 mg/dL — ABNORMAL HIGH (ref 70–99)
Glucose-Capillary: 203 mg/dL — ABNORMAL HIGH (ref 70–99)

## 2014-04-01 LAB — CBC
HEMATOCRIT: 42.8 % (ref 39.0–52.0)
Hemoglobin: 15 g/dL (ref 13.0–17.0)
MCH: 33.3 pg (ref 26.0–34.0)
MCHC: 35 g/dL (ref 30.0–36.0)
MCV: 94.9 fL (ref 78.0–100.0)
Platelets: 249 10*3/uL (ref 150–400)
RBC: 4.51 MIL/uL (ref 4.22–5.81)
RDW: 13.3 % (ref 11.5–15.5)
WBC: 14.9 10*3/uL — ABNORMAL HIGH (ref 4.0–10.5)

## 2014-04-01 LAB — POCT ACTIVATED CLOTTING TIME: ACTIVATED CLOTTING TIME: 563 s

## 2014-04-01 SURGERY — PERCUTANEOUS CORONARY STENT INTERVENTION (PCI-S)
Anesthesia: LOCAL

## 2014-04-01 MED ORDER — SODIUM CHLORIDE 0.9 % IJ SOLN: 3.0000 mL | Freq: Two times a day (BID) | INTRAMUSCULAR | Status: DC

## 2014-04-01 MED ORDER — DIPHENHYDRAMINE HCL 25 MG PO CAPS: 25.0000 mg | ORAL_CAPSULE | Freq: Four times a day (QID) | ORAL | Status: DC | PRN

## 2014-04-01 MED ORDER — MIDAZOLAM HCL 2 MG/2ML IJ SOLN
INTRAMUSCULAR | Status: AC
  Filled 2014-04-01: qty 2

## 2014-04-01 MED ORDER — TICAGRELOR 90 MG PO TABS
90.0000 mg | ORAL_TABLET | Freq: Two times a day (BID) | ORAL | Status: DC
  Administered 2014-04-01 – 2014-04-02 (×2): 90 mg via ORAL
  Filled 2014-04-01 (×3): qty 1

## 2014-04-01 MED ORDER — FENTANYL CITRATE 0.05 MG/ML IJ SOLN
INTRAMUSCULAR | Status: AC
  Filled 2014-04-01: qty 2

## 2014-04-01 MED ORDER — LIDOCAINE HCL (PF) 1 % IJ SOLN
INTRAMUSCULAR | Status: AC
  Filled 2014-04-01: qty 30

## 2014-04-01 MED ORDER — BIVALIRUDIN 250 MG IV SOLR
INTRAVENOUS | Status: AC
  Filled 2014-04-01: qty 250

## 2014-04-01 MED ORDER — INSULIN ASPART 100 UNIT/ML ~~LOC~~ SOLN: 0.0000 [IU] | Freq: Every day | SUBCUTANEOUS | Status: DC

## 2014-04-01 MED ORDER — ADULT MULTIVITAMIN W/MINERALS CH
1.0000 | ORAL_TABLET | Freq: Every day | ORAL | Status: DC
  Administered 2014-04-01 – 2014-04-02 (×2): 1 via ORAL
  Filled 2014-04-01 (×2): qty 1

## 2014-04-01 MED ORDER — ALUM & MAG HYDROXIDE-SIMETH 200-200-20 MG/5ML PO SUSP
30.0000 mL | ORAL | Status: DC | PRN
  Administered 2014-04-01: 30 mL via ORAL
  Filled 2014-04-01: qty 30

## 2014-04-01 MED ORDER — VERAPAMIL HCL 2.5 MG/ML IV SOLN
INTRAVENOUS | Status: AC
  Filled 2014-04-01: qty 2

## 2014-04-01 MED ORDER — SODIUM CHLORIDE 0.9 % IV SOLN
0.2500 mg/kg/h | INTRAVENOUS | Status: AC
  Administered 2014-04-01: 0.25 mg/kg/h via INTRAVENOUS
  Filled 2014-04-01: qty 250

## 2014-04-01 MED ORDER — SODIUM CHLORIDE 0.9 % IV SOLN
250.0000 mL | INTRAVENOUS | Status: DC | PRN
Start: 2014-04-01 — End: 2014-04-01

## 2014-04-01 MED ORDER — SODIUM CHLORIDE 0.9 % IV SOLN
INTRAVENOUS | Status: DC
  Administered 2014-04-01: 75 mL/h via INTRAVENOUS

## 2014-04-01 MED ORDER — MULTI-VITAMIN/MINERALS PO TABS: 1.0000 | ORAL_TABLET | Freq: Every day | ORAL | Status: DC

## 2014-04-01 MED ORDER — SODIUM CHLORIDE 0.9 % IV SOLN: 1.0000 mL/kg/h | INTRAVENOUS | Status: AC

## 2014-04-01 MED ORDER — FAMOTIDINE 10 MG PO TABS
10.0000 mg | ORAL_TABLET | Freq: Every day | ORAL | Status: DC
  Filled 2014-04-01: qty 1

## 2014-04-01 MED ORDER — SPIRONOLACTONE 25 MG PO TABS
25.0000 mg | ORAL_TABLET | Freq: Every day | ORAL | Status: DC
  Administered 2014-04-02: 10:00:00 25 mg via ORAL
  Filled 2014-04-01: qty 1

## 2014-04-01 MED ORDER — CARVEDILOL 3.125 MG PO TABS
3.1250 mg | ORAL_TABLET | Freq: Two times a day (BID) | ORAL | Status: DC
  Administered 2014-04-01 – 2014-04-02 (×2): 3.125 mg via ORAL
  Filled 2014-04-01 (×3): qty 1

## 2014-04-01 MED ORDER — ONDANSETRON HCL 4 MG/2ML IJ SOLN: 4.0000 mg | Freq: Four times a day (QID) | INTRAMUSCULAR | Status: DC | PRN

## 2014-04-01 MED ORDER — ACETAMINOPHEN 325 MG PO TABS: 650.0000 mg | ORAL_TABLET | ORAL | Status: DC | PRN

## 2014-04-01 MED ORDER — HEPARIN (PORCINE) IN NACL 2-0.9 UNIT/ML-% IJ SOLN
INTRAMUSCULAR | Status: AC
  Filled 2014-04-01: qty 500

## 2014-04-01 MED ORDER — TICAGRELOR 90 MG PO TABS
90.0000 mg | ORAL_TABLET | Freq: Once | ORAL | Status: DC
  Filled 2014-04-01: qty 1

## 2014-04-01 MED ORDER — INSULIN ASPART 100 UNIT/ML ~~LOC~~ SOLN
0.0000 [IU] | Freq: Three times a day (TID) | SUBCUTANEOUS | Status: DC
  Administered 2014-04-01: 5 [IU] via SUBCUTANEOUS
  Administered 2014-04-02: 10:00:00 11 [IU] via SUBCUTANEOUS

## 2014-04-01 MED ORDER — ADENOSINE 12 MG/4ML IV SOLN
16.0000 mL | Freq: Once | INTRAVENOUS | Status: AC
  Administered 2014-04-01: 48 mg via INTRAVENOUS
  Filled 2014-04-01: qty 16

## 2014-04-01 MED ORDER — IRBESARTAN 300 MG PO TABS
300.0000 mg | ORAL_TABLET | Freq: Every day | ORAL | Status: DC
  Administered 2014-04-02: 300 mg via ORAL
  Filled 2014-04-01: qty 1

## 2014-04-01 MED ORDER — NITROGLYCERIN 1 MG/10 ML FOR IR/CATH LAB
INTRA_ARTERIAL | Status: AC
  Filled 2014-04-01: qty 10

## 2014-04-01 MED ORDER — SODIUM CHLORIDE 0.9 % IJ SOLN: 3.0000 mL | INTRAMUSCULAR | Status: DC | PRN

## 2014-04-01 MED ORDER — ATORVASTATIN CALCIUM 80 MG PO TABS
80.0000 mg | ORAL_TABLET | Freq: Every day | ORAL | Status: DC
  Administered 2014-04-01: 80 mg via ORAL
  Filled 2014-04-01 (×2): qty 1

## 2014-04-01 NOTE — Progress Notes (Signed)
Rcd alert and oriented x 3 ; side rails up x2 , bed in lowest position, Pt denies c/o pain, r hand/upper extremity neurovascular status intact, r hand /finger cap refil less than 3 seconds, pt eating lunch, Iv fluids @ wrists infusing normal saline at 88 cc/hr - site clear no redness or edema to site. Iv site Rac no fluids infusing. Site clear no redness or edema to site. PT stable no c/o

## 2014-04-01 NOTE — Interval H&P Note (Signed)
History and Physical Interval Note:  04/01/2014 10:30 AM  [name] [name]  has presented today for surgery, with the diagnosis of unstable angina  The various methods of treatment have been discussed with the patient and family. After consideration of risks, benefits and other options for treatment, the patient has consented to  Procedure(s): PERCUTANEOUS CORONARY STENT INTERVENTION (PCI-S) (N/A) as a surgical intervention .  The patient's history has been reviewed, patient examined, no change in status, stable for surgery.  I have reviewed the patient's chart and labs.  Questions were answered to the patient's satisfaction.   Cath Lab Visit (complete for each Cath Lab visit)  Clinical Evaluation Leading to the Procedure:   ACS: No.  Non-ACS:    Anginal Classification: CCS II  Anti-ischemic medical therapy: Minimal Therapy (1 class of medications)  Non-Invasive Test Results: High-risk stress test findings: cardiac mortality >3%/year  Prior CABG: No previous CABG        Collier Salina [facility] 04/01/2014 10:31 AM -

## 2014-04-01 NOTE — Telephone Encounter (Signed)
Received Brilinta prior authorization form.Form completed faxed back to fax # [phone].

## 2014-04-01 NOTE — Progress Notes (Signed)
[name] [name] Date of Birth: Nov 21, 1950 Medical Record #[id number]  History of Present Illness: [name] [name] is seen for CAD.  He has a history of coronary disease diagnosed by cardiac CTA in 2007. He was noted to have moderate plaque in the LAD and diagonal distribution. Calcium score was high. It is estimated at 50% LAD lesion proximally. He has had followup stress tests which have been normal. His last stress test in August 2012 was normal. More recent stress Myoview on 03/11/14 showed anterior wall ischemia with normal EF. Cardiac cath on 03/26/14 showed severe mid LAD and diagonal disease. There was moderate disease in the LCx. There was severe disease in a very small PLOM branch. With history of ASA allergy he was loaded with Brilinta and returns now for PCI of the LAD/ diagonal. He also developed a rash following diagnostic cath consistent with contrast allergy.  No current facility-administered medications on file prior to encounter.   Current Outpatient Prescriptions on File Prior to Encounter  Medication Sig Dispense Refill  . atorvastatin (LIPITOR) 80 MG tablet Take 1 tablet (80 mg total) by mouth daily.  90 tablet  3  . carvedilol (COREG) 3.125 MG tablet Take 3.125 mg by mouth 2 (two) times daily with a meal.      . CINNAMON PO Take 2 tablets by mouth daily.      . Coenzyme Q10 (CO Q 10 PO) Take 1 tablet by mouth daily.      . Cyanocobalamin (VITAMIN B 12 PO) Take 1 tablet by mouth daily.      . irbesartan (AVAPRO) 300 MG tablet Take 300 mg by mouth daily.      . Multiple Vitamins-Minerals (MULTIVITAMIN WITH MINERALS) tablet Take 1 tablet by mouth daily.      Marland Kitchen spironolactone (ALDACTONE) 25 MG tablet Take 1 tablet (25 mg total) by mouth daily.  30 tablet  5  . ticagrelor (BRILINTA) 90 MG TABS tablet Take 1 tablet (90 mg total) by mouth 2 (two) times daily.  60 tablet  11  . ONE TOUCH ULTRA TEST test strip Check blood sugar once daily  100 each  12  . ONETOUCH DELICA LANCETS FINE MISC  1 application by Does not apply route daily.  100 each  12    Allergies  Allergen Reactions  . Aspirin     Angioedema  . Norvasc [Amlodipine Besylate]     09/12/2013 gingival hyperplasia , Dr Geralynn Ochs ,DDS  . Contrast Media [Iodinated Diagnostic Agents] Itching and Rash    Past Medical History  Diagnosis Date  . GERD (gastroesophageal reflux disease)     esophageal stricture, pmh  . HTN (hypertension)   . HLD (hyperlipidemia)   . DM2 (diabetes mellitus, type 2)   . Amblyopia   . CAD (coronary artery disease)     cardiac CT with a 50% LAD lesion in 2007  . Hypopotassemia   . Esophageal stricture 2003  . Diverticulosis   . Adenomatous colon polyp 08/10/04  . Elevated LFTs     secondary to labetalol  . Hiatal hernia 01/04/00  . External hemorrhoids 2012    lanced by Dr Rise Patience  . Diabetes mellitus     Past Surgical History  Procedure Laterality Date  . Esophagogastroduodenoscopy  2003    ERD, stricture dilated  . Colonoscopy  2006    tics , ADENOMATOUS polyps. F/U due 2011  . Laminectomy  2011    L4 , Dr Sherwood Gambler  . Catherterization  2007  .  Colonoscopy  2012    Tics    History  Smoking status  . Never Smoker   Smokeless tobacco  . Never Used    History  Alcohol Use  . 5.0 oz/week  . 10 drink(s) per week    Comment:  3-4 drinks / week    Family History  Problem Relation Age of Onset  . Heart attack Mother 47  . Stroke Mother 51  . Prostate cancer Father   . Diabetes Father   . Heart disease Father 45    CBAG   . Prostate cancer Father   . Diabetes Brother   . Breast cancer Maternal Aunt   . Breast cancer Maternal Aunt     breast  . Heart attack Maternal Grandfather 61  . Diabetes Paternal Grandmother   . Stroke Paternal Grandfather 77    Review of Systems: The review of systems is positive for chest pain. All other systems were reviewed and are negative.  Physical Exam: BP 171/81  Pulse 55  Temp(Src) 98.4 F (36.9 C) (Oral)  Resp 18  Ht  5\' 10"  (1.778 m)  Wt 194 lb (87.998 kg)  BMI 27.84 kg/m2  SpO2 100% He is a pleasant white male in no acute distress. He is overweight. HEENT: Normal. Neck is supple without JVD, adenopathy, thyromegaly, or bruits. Lungs: Clear Cardiovascular: Regular rate and rhythm. Normal S1 and S2. No gallop, murmur, or click. Abdomen: Obese, soft, and nontender. No hepatosplenomegaly. No masses or bruits. Extremities: No cyanosis or edema. Pulses are 2+ and symmetric throughout. Skin: Warm and dry Neuro: Alert and oriented x3. Cranial nerves II through XII are intact. No focal motor or sensory deficits.  LABORATORY DATA: Lab Results  Component Value Date   WBC 14.9* 04/01/2014   HGB 15.0 04/01/2014   HCT 42.8 04/01/2014   PLT 249 04/01/2014   GLUCOSE 168* 04/01/2014   CHOL 144 03/19/2014   TRIG 106.0 03/19/2014   HDL 38.30* 03/19/2014   LDLCALC 85 03/19/2014   ALT 18 03/19/2014   AST 15 03/19/2014   NA 141 04/01/2014   K 4.0 04/01/2014   CL 104 04/01/2014   CREATININE 0.86 04/01/2014   BUN 18 04/01/2014   CO2 24 04/01/2014   TSH 1.09 03/19/2014   PSA 1.17 12/25/2012   INR 1.0 03/19/2014   HGBA1C 6.6* 03/19/2014   MICROALBUR 0.8 03/19/2014   ECG today is normal.  Assessment / Plan: 1. Coronary disease with severe LAD/diagonal disease. Moderate LCx disease.  Abnormal myoview. Plan to proceed with PCI of the LAD/diagonal with probable FFR of the LCx.  2. Hyperlipidemia. Goal LDL of less than 100 with optional goal of less than or equal to 70. On high dose lipitor  3. Diabetes mellitus type 2.  4. Hypertension-controlled.  5. Contrast allergy with rash. Pretreated with steroids, Benadryl, pepcid.

## 2014-04-01 NOTE — H&P (Signed)
[name] [name]  Date of Birth: 09/14/1950  Medical Record #[id number]  History of Present Illness:  Mr. Cazarez is seen for CAD. He has a history of coronary disease diagnosed by cardiac CTA in 2007. He was noted to have moderate plaque in the LAD and diagonal distribution. Calcium score was high. It is estimated at 50% LAD lesion proximally. He has had followup stress tests which have been normal. His last stress test in August 2012 was normal. More recent stress Myoview on 03/11/14 showed anterior wall ischemia with normal EF. Cardiac cath on 03/26/14 showed severe mid LAD and diagonal disease. There was moderate disease in the LCx. There was severe disease in a very small PLOM branch. With history of ASA allergy he was loaded with Brilinta and returns now for PCI of the LAD/ diagonal. He also developed a rash following diagnostic cath consistent with contrast allergy.  No current facility-administered medications on file prior to encounter.    Current Outpatient Prescriptions on File Prior to Encounter   Medication  Sig  Dispense  Refill   .  atorvastatin (LIPITOR) 80 MG tablet  Take 1 tablet (80 mg total) by mouth daily.  90 tablet  3   .  carvedilol (COREG) 3.125 MG tablet  Take 3.125 mg by mouth 2 (two) times daily with a meal.     .  CINNAMON PO  Take 2 tablets by mouth daily.     .  Coenzyme Q10 (CO Q 10 PO)  Take 1 tablet by mouth daily.     .  Cyanocobalamin (VITAMIN B 12 PO)  Take 1 tablet by mouth daily.     .  irbesartan (AVAPRO) 300 MG tablet  Take 300 mg by mouth daily.     .  Multiple Vitamins-Minerals (MULTIVITAMIN WITH MINERALS) tablet  Take 1 tablet by mouth daily.     Marland Kitchen  spironolactone (ALDACTONE) 25 MG tablet  Take 1 tablet (25 mg total) by mouth daily.  30 tablet  5   .  ticagrelor (BRILINTA) 90 MG TABS tablet  Take 1 tablet (90 mg total) by mouth 2 (two) times daily.  60 tablet  11   .  ONE TOUCH ULTRA TEST test strip  Check blood sugar once daily  100 each  12   .  ONETOUCH  DELICA LANCETS FINE MISC  1 application by Does not apply route daily.  100 each  12    Allergies   Allergen  Reactions   .  Aspirin      Angioedema   .  Norvasc [Amlodipine Besylate]      09/12/2013 gingival hyperplasia , Dr Geralynn Ochs ,DDS   .  Contrast Media [Iodinated Diagnostic Agents]  Itching and Rash    Past Medical History   Diagnosis  Date   .  GERD (gastroesophageal reflux disease)      esophageal stricture, pmh   .  HTN (hypertension)    .  HLD (hyperlipidemia)    .  DM2 (diabetes mellitus, type 2)    .  Amblyopia    .  CAD (coronary artery disease)      cardiac CT with a 50% LAD lesion in 2007   .  Hypopotassemia    .  Esophageal stricture  2003   .  Diverticulosis    .  Adenomatous colon polyp  08/10/04   .  Elevated LFTs      secondary to labetalol   .  Hiatal hernia  01/04/00   .  External hemorrhoids  2012     lanced by Dr Rise Patience   .  Diabetes mellitus     Past Surgical History   Procedure  Laterality  Date   .  Esophagogastroduodenoscopy   2003     ERD, stricture dilated   .  Colonoscopy   2006     tics , ADENOMATOUS polyps. F/U due 2011   .  Laminectomy   2011     L4 , Dr Sherwood Gambler   .  Catherterization   2007   .  Colonoscopy   2012     Tics    History   Smoking status   .  Never Smoker   Smokeless tobacco   .  Never Used    History   Alcohol Use   .  5.0 oz/week   .  10 drink(s) per week     Comment: 3-4 drinks / week    Family History   Problem  Relation  Age of Onset   .  Heart attack  Mother  69   .  Stroke  Mother  71   .  Prostate cancer  Father    .  Diabetes  Father    .  Heart disease  Father  63     CBAG   .  Prostate cancer  Father    .  Diabetes  Brother    .  Breast cancer  Maternal Aunt    .  Breast cancer  Maternal Aunt      breast   .  Heart attack  Maternal Grandfather  61   .  Diabetes  Paternal Grandmother    .  Stroke  Paternal Grandfather  22    Review of Systems:  The review of systems is positive for chest  pain. All other systems were reviewed and are negative.  Physical Exam:  BP 171/81  Pulse 55  Temp(Src) 98.4 F (36.9 C) (Oral)  Resp 18  Ht 5\' 10"  (1.778 m)  Wt 194 lb (87.998 kg)  BMI 27.84 kg/m2  SpO2 100%  He is a pleasant white male in no acute distress. He is overweight.  HEENT: Normal. Neck is supple without JVD, adenopathy, thyromegaly, or bruits.  Lungs: Clear  Cardiovascular: Regular rate and rhythm. Normal S1 and S2. No gallop, murmur, or click.  Abdomen: Obese, soft, and nontender. No hepatosplenomegaly. No masses or bruits.  Extremities: No cyanosis or edema. Pulses are 2+ and symmetric throughout.  Skin: Warm and dry  Neuro: Alert and oriented x3. Cranial nerves II through XII are intact. No focal motor or sensory deficits.  LABORATORY DATA:  Lab Results   Component  Value  Date    WBC  14.9*  04/01/2014    HGB  15.0  04/01/2014    HCT  42.8  04/01/2014    PLT  249  04/01/2014    GLUCOSE  168*  04/01/2014    CHOL  144  03/19/2014    TRIG  106.0  03/19/2014    HDL  38.30*  03/19/2014    LDLCALC  85  03/19/2014    ALT  18  03/19/2014    AST  15  03/19/2014    NA  141  04/01/2014    K  4.0  04/01/2014    CL  104  04/01/2014    CREATININE  0.86  04/01/2014    BUN  18  04/01/2014    CO2  24  04/01/2014  TSH  1.09  03/19/2014    PSA  1.17  12/25/2012    INR  1.0  03/19/2014    HGBA1C  6.6*  03/19/2014    MICROALBUR  0.8  03/19/2014    ECG today is normal.  Assessment / Plan:  1. Coronary disease with severe LAD/diagonal disease. Moderate LCx disease. Abnormal myoview. Plan to proceed with PCI of the LAD/diagonal with probable FFR of the LCx.  2. Hyperlipidemia. Goal LDL of less than 100 with optional goal of less than or equal to 70. On high dose lipitor  3. Diabetes mellitus type 2.  4. Hypertension-controlled.  5. Contrast allergy with rash. Pretreated with steroids, Benadryl, pepcid.

## 2014-04-01 NOTE — Care Management Note (Addendum)
  Page 1 of 1   04/01/2014     3:41:19 PM CARE MANAGEMENT NOTE 04/01/2014  Patient:  [name] [name], [name] [name]   Account Number:  [id number]  Date Initiated:  04/01/2014  Documentation initiated by:  Guerino Caporale  Subjective/Objective Assessment:   angina pectoris     Action/Plan:   CM to follow for disposition needs   Anticipated DC Date:  04/02/2014   Anticipated DC Plan:  HOME/SELF CARE         Choice offered to / List presented to:             Status of service:  Completed, signed off Medicare Important Message given?   (If response is "NO", the following Medicare IM given date fields will be blank) Date Medicare IM given:   Medicare IM given by:   Date Additional Medicare IM given:   Additional Medicare IM given by:    Discharge Disposition:  HOME/SELF CARE  Per UR Regulation:    If discussed at Long Length of Stay Meetings, dates discussed:    Comments:  Anton Cheramie RN, BSN, MSHL, CCM  Nurse - Case Manager,  (Unit [phone]  04/01/2014 Med Review:  ticagrelor (BRILINTA) tablet 90 mg  :  Dose 90 mg bid (BENEFITS Check) PT COPAY WILL BE $60 -PRIOR AUTH IS REQUIRED [id number] CM provided Brilinta 30 day free card and instructed in use. Dispo Plan:  Home / Self care.  Pharmacy: med availability [place], [place] - [place] [phone] -  yes [facility] [place], [place] [phone] - yes

## 2014-04-01 NOTE — Progress Notes (Signed)
TR BAND REMOVAL  LOCATION:    right radial  DEFLATED PER PROTOCOL:    Yes.    TIME BAND OFF / DRESSING APPLIED:    1630   SITE UPON ARRIVAL:    Level 0  SITE AFTER BAND REMOVAL:    Level 0  REVERSE ALLEN'S TEST:     positive  CIRCULATION SENSATION AND MOVEMENT:    Within Normal Limits   Yes.    COMMENTS:   Rechecked at 1700 with no change in assessment

## 2014-04-01 NOTE — CV Procedure (Signed)
    CARDIAC CATH NOTE  Name: [name] [name] MRN: [id number] DOB: 1950/12/28  Procedure: PTCA and stenting of the mid LAD and first diagonal. FFR of the second OM  Indication: 63 yo WM with angina pectoris and high risk stress test with anterior wall ischemia. Diagnostic study demonstrated severe stenosis in the mid LAD and first diagonal. There was 50-70% disease in a large OM2.   Procedural Details: The right wrist was prepped, draped, and anesthetized with 1% lidocaine. Using the modified Seldinger technique, a 6 Fr slender sheath was introduced into the radial artery. 3 mg verapamil was administered through the radial sheath. Weight-based bivalirudin was given for anticoagulation. Once a therapeutic ACT was achieved, a 6 [place] XBLAD 3.5 guide catheter was inserted.  A prowater coronary guidewire was used to cross the lesion in the diagonal. A second prowater wire was used to cross the LAD lesion.  The lesion in the diagonal was predilated with a 2.0 mm balloon.  The lesion was then stented with a 2.25 x 12 mm Promus stent.  The lesion in the LAD was then predilated with a 2.5 mm balloon. The lesion was then stented with a 3.0 x 24 mm Promus stent across the diagonal branch. The stent was postdilated with a 3.25 mm noncompliant balloon.  Following PCI, there was 0% residual stenosis and TIMI-3 flow in both branches. Final angiography confirmed an excellent result.  We next performed FFR analysis of the OM lesion. The FFR wire was placed across the lesion. Maximal hyperemia was obtained with IV Adenosine. FFR was recorded at 0.84 indicating that the lesion was not hemodynamically significant.  The patient tolerated the procedure well. There were no immediate procedural complications. A TR band was used for radial hemostasis. The patient was transferred to the post catheterization recovery area for further monitoring.  Lesion Data: Vessel: LAD/ first diagonal Percent stenosis (pre):  90%/90% TIMI-flow (pre):  3 Stent:  3.0 x 24 mm Promus (LAD), 2.25 x 12 mm Promus (diagonal) Percent stenosis (post): 0% TIMI-flow (post): 3  Conclusions:  1. Successful stenting of the mid LAD and first diagonal with DES. 2. FFR of the OM of 0.84.   Recommendations: Continue Brilinta only indefinitely due to history of ASA allergy. Medical therapy for other disease.  [name], [place] 04/01/2014, 11:51 AM

## 2014-04-01 NOTE — Progress Notes (Signed)
R Radial TR Band intact, no oozing, bleeding, or ecchymosis to site, R radial neurovascular statues intact. Pt eating lunch, offers no c/o. Report call to Mickel Baas, [place]. Pt transferred via monitor to 6c07 in stable condition .

## 2014-04-02 DIAGNOSIS — I25110 Atherosclerotic heart disease of native coronary artery with unstable angina pectoris: Secondary | ICD-10-CM

## 2014-04-02 DIAGNOSIS — I25119 Atherosclerotic heart disease of native coronary artery with unspecified angina pectoris: Secondary | ICD-10-CM | POA: Diagnosis not present

## 2014-04-02 DIAGNOSIS — I200 Unstable angina: Secondary | ICD-10-CM

## 2014-04-02 DIAGNOSIS — I10 Essential (primary) hypertension: Secondary | ICD-10-CM | POA: Diagnosis not present

## 2014-04-02 DIAGNOSIS — E785 Hyperlipidemia, unspecified: Secondary | ICD-10-CM | POA: Diagnosis not present

## 2014-04-02 LAB — BASIC METABOLIC PANEL
Anion gap: 10 (ref 5–15)
BUN: 18 mg/dL (ref 6–23)
CALCIUM: 8.4 mg/dL (ref 8.4–10.5)
CO2: 25 mEq/L (ref 19–32)
Chloride: 103 mEq/L (ref 96–112)
Creatinine, Ser: 0.8 mg/dL (ref 0.50–1.35)
GFR calc Af Amer: >90 mL/min (ref >90)
GFR calc non Af Amer: >90 mL/min (ref >90)
Glucose, Bld: 135 mg/dL — ABNORMAL HIGH (ref 70–99)
Potassium: 3.8 mEq/L (ref 3.7–5.3)
Sodium: 138 mEq/L (ref 137–147)

## 2014-04-02 LAB — CBC
HCT: 39.3 % (ref 39.0–52.0)
Hemoglobin: 13.5 g/dL (ref 13.0–17.0)
MCH: 32.7 pg (ref 26.0–34.0)
MCHC: 34.4 g/dL (ref 30.0–36.0)
MCV: 95.2 fL (ref 78.0–100.0)
Platelets: 226 10*3/uL (ref 150–400)
RBC: 4.13 MIL/uL — ABNORMAL LOW (ref 4.22–5.81)
RDW: 13.4 % (ref 11.5–15.5)
WBC: 12.1 10*3/uL — ABNORMAL HIGH (ref 4.0–10.5)

## 2014-04-02 LAB — GLUCOSE, CAPILLARY
Glucose-Capillary: 148 mg/dL — ABNORMAL HIGH (ref 70–99)
Glucose-Capillary: 321 mg/dL — ABNORMAL HIGH (ref 70–99)

## 2014-04-02 MED ORDER — PREDNISONE 20 MG PO TABS: ORAL_TABLET | ORAL | Status: DC

## 2014-04-02 MED ORDER — TICAGRELOR 90 MG PO TABS: 90.0000 mg | ORAL_TABLET | Freq: Two times a day (BID) | ORAL | Status: DC

## 2014-04-02 MED ORDER — FAMOTIDINE IN NACL 20-0.9 MG/50ML-% IV SOLN
20.0000 mg | Freq: Once | INTRAVENOUS | Status: AC
  Administered 2014-04-02: 20 mg via INTRAVENOUS
  Filled 2014-04-02: qty 50

## 2014-04-02 MED ORDER — METHYLPREDNISOLONE SODIUM SUCC 125 MG IJ SOLR
60.0000 mg | Freq: Once | INTRAMUSCULAR | Status: AC
  Administered 2014-04-02: 05:00:00 60 mg via INTRAVENOUS
  Filled 2014-04-02: qty 0.96

## 2014-04-02 MED ORDER — DIPHENHYDRAMINE HCL 50 MG/ML IJ SOLN
25.0000 mg | Freq: Once | INTRAMUSCULAR | Status: AC
  Administered 2014-04-02: 25 mg via INTRAVENOUS
  Filled 2014-04-02: qty 1

## 2014-04-02 MED ORDER — DIPHENHYDRAMINE HCL 50 MG/ML IJ SOLN
50.0000 mg | Freq: Once | INTRAMUSCULAR | Status: AC
  Administered 2014-04-02: 50 mg via INTRAVENOUS
  Filled 2014-04-02: qty 1

## 2014-04-02 MED ORDER — PREDNISONE 50 MG PO TABS
60.0000 mg | ORAL_TABLET | Freq: Every day | ORAL | Status: DC
  Administered 2014-04-02: 60 mg via ORAL
  Filled 2014-04-02 (×2): qty 1

## 2014-04-02 MED FILL — Sodium Chloride IV Soln 0.9%: INTRAVENOUS | Qty: 50 | Status: AC

## 2014-04-02 NOTE — Discharge Instructions (Signed)
Angina Pectoris Angina pectoris is extreme discomfort in your chest, neck, or arm. Your doctor may call it just angina. It is caused by a lack of oxygen to your heart wall. It may feel like tightness or heavy pressure. It may feel like a crushing or squeezing pain. Some people say it feels like gas. It may go down your shoulders, back, and arms. Some people have symptoms other than pain. These include:  Tiredness.  Shortness of breath.  Cold sweats.  Feeling sick to your stomach (nausea). There are four types of angina:  Stable angina. This type often lasts the same amount of time each time it happens. Activity, stress, or excitement can bring it on. It often gets better after taking a medicine called nitroglycerin. This goes under your tongue.  Unstable angina. This type can happen when you are not active or even during sleep. It can suddenly get worse or happen more often. It may not get better after taking the special medicine. It can last up to 30 minutes.  Microvascular angina. This type is more common in women. It may be more severe or last longer than other types.  Prinzmetal angina. This type often happens when you are not active or in the early morning hours. HOME CARE   Only take medicines as told by your doctor.  Stay active or exercise more as told by your doctor.  Limit very hard activity as told by your doctor.  Limit heavy lifting as told by your doctor.  Keep a healthy weight.  Learn about and eat foods that are healthy for your heart.  Do not use any tobacco such as cigarettes, chewing tobacco, or e-cigarettes. GET HELP RIGHT AWAY IF:   You have chest, neck, deep shoulder, or arm pain or discomfort that lasts more than a few minutes.  You have chest, neck, deep shoulder, or arm pain or discomfort that goes away and comes back over and over again.  You have heavy sweating that seems to happen for no reason.  You have shortness of breath or trouble  breathing.  Your angina does not get better after a few minutes of rest.  Your angina does not get better after you take nitroglycerin medicine. These can all be symptoms of a heart attack. Get help right away. Call your local emergency service (911 in [place] Do not  drive yourself to the hospital. Do not  wait to for your symptoms to go away. MAKE SURE YOU:   Understand these instructions.  Will watch your condition.  Will get help right away if you are not doing well or get worse. Document Released: 11/30/2007 Document Revised: 06/18/2013 Document Reviewed: 10/15/2013 [facility] Patient Information 2015 [place], [place]. This information is not intended to replace advice given to you by your health care provider. Make sure you discuss any questions you have with your health care provider.  No driving for 24 hours. No lifting over 5 lbs for 1 week. No sexual activity for 1 week. Keep procedure site clean & dry. If you notice increased pain, swelling, bleeding or pus, call/return!  You may shower, but no soaking baths/hot tubs/pools for 1 week.   Hold Metformin for 48 hours after cath due to interaction with contrast dye used during cath. You can resume Metformin on 04/04/2014  I have contacted the pre-authorization line for the patient. Per them, patient will need to use either mail-in pharmacy or his pharmacy will need to contact authorization to get approval. Authorization line [id number]  option #1 or [phone]. If patient wish to use mail in pharmacy, he will need to call [phone]. I will fax the Rx to pre-authorization service, Fax # [phone]

## 2014-04-02 NOTE — Progress Notes (Deleted)
Patient up all night uncomfortable with bladder and back. Patient unable to void well only 50cc at a time.  I&O cath done 800cc drained and condom cath placed to monitor. Sbp also elevated prn med given however home dose Cadura not yet order MD needs to review today. I will continue to monitor. Oncall P.A. Lamar Blinks notified and aware.

## 2014-04-02 NOTE — Progress Notes (Addendum)
     SUBJECTIVE: No chest pain. Itching chest wall.   BP 143/81  Pulse 50  Temp(Src) 98 F (36.7 C) (Oral)  Resp 18  Ht 5\' 10"  (1.778 m)  Wt 194 lb (87.998 kg)  BMI 27.84 kg/m2  SpO2 98%  Intake/Output Summary (Last 24 hours) at 04/02/14 0725 Last data filed at 04/02/14 0546  Gross per 24 hour  Intake 1075.47 ml  Output    700 ml  Net 375.47 ml    PHYSICAL EXAM General: Well developed, well nourished, in no acute distress. Alert and oriented x 3.  Psych:  Good affect, responds appropriately Neck: No JVD. No masses noted.  Lungs: Clear bilaterally with no wheezes or rhonci noted.  Heart: RRR with no murmurs noted. Abdomen: Bowel sounds are present. Soft, non-tender.  Extremities: No lower extremity edema.   LABS: Basic Metabolic Panel:  Recent Labs  04/01/14 0712 04/02/14 0550  NA 141 138  K 4.0 3.8  CL 104 103  CO2 24 25  GLUCOSE 168* 135*  BUN 18 18  CREATININE 0.86 0.80  CALCIUM 9.5 8.4   CBC:  Recent Labs  04/01/14 0712 04/02/14 0550  WBC 14.9* 12.1*  HGB 15.0 13.5  HCT 42.8 39.3  MCV 94.9 95.2  PLT 249 226   Current Meds: . atorvastatin  80 mg Oral q1800  . carvedilol  3.125 mg Oral BID WC  . famotidine  10 mg Oral Daily  . insulin aspart  0-15 Units Subcutaneous TID WC  . insulin aspart  0-5 Units Subcutaneous QHS  . irbesartan  300 mg Oral Daily  . multivitamin with minerals  1 tablet Oral Daily  . spironolactone  25 mg Oral Daily  . ticagrelor  90 mg Oral BID   ASSESSMENT AND PLAN:  1. CAD/Unstable angina: Pt admitted following PCI yesterday. DES placed in mid LAD. DES placed in Diagonal branch. Flow wire (FFR) of OM branch 0.84 suggesting this lesion is not hemodynamically significant. Pt is on Brilinta only due to ASA allergy. Stable this am. Labs reviewed. Continue Brilinta, beta blocker, statin, ARB. Discharge home today. Follow up 2-3 weeks with Dr. Martinique in the [facility] office.    2. Contrast reaction: Itching overnight with  mild rash on chest. He received Solumedrol, Pepcid and Benadryl overnight. Will give Prednisone 60 mg this am and then discharge with prescription for Prednisone 60 mg daily for next three days. He can take Benadryl and Pepcid at home as well for next three days.   New Meds: Prednisone 60 mg daily for total of 3 days starting tomorrow (first dose given here today) Dispo:Discharge home  [place]  10/7/20157:25 AM

## 2014-04-02 NOTE — Progress Notes (Signed)
Patient c/o itching to stomach and and sides with red remarks . He is post cath and also has an allergy to contrast. MD notified meds ordered to give.

## 2014-04-02 NOTE — Discharge Summary (Signed)
See full note this am. cdm 

## 2014-04-02 NOTE — Progress Notes (Signed)
CARDIAC REHAB PHASE I   PRE:  Rate/Rhythm: 29 SB  BP:  Supine: 177/84  Sitting:   Standing:    SaO2:   MODE:  Ambulation: 1000 ft   POST:  Rate/Rhythm: 62 SR  BP:  Supine:   Sitting: 155/81  Standing:    SaO2:  0800-0905 Pt tolerated ambulation well without c/o of cp or SOB. VS stable. Pt to side of bed after walk with call lightin reach. Completed stent discharge education with pt. He voices understanding. Pt agrees to Point. CRP in [place], will send referral.  Rodney Langton RN 04/02/2014 9:13 AM

## 2014-04-02 NOTE — Discharge Summary (Signed)
Discharge Summary   Patient ID: [name] [name],  MRN: [id number], DOB/AGE: 10-02-1950 63 y.o.  Admit date: 04/01/2014 Discharge date: 04/02/2014  Primary Care Provider: Unice Cobble Primary Cardiologist: Dr. Martinique  Discharge Diagnoses Principal Problem:   Coronary artery disease involving native coronary artery with unstable angina pectoris Active Problems:   Diabetes mellitus type 2 in obese   Hyperlipidemia   Essential hypertension   LOW BACK PAIN, CHRONIC   Angina pectoris   Allergies Allergies  Allergen Reactions  . Aspirin     Angioedema  . Norvasc [Amlodipine Besylate]     09/12/2013 gingival hyperplasia , Dr Geralynn Ochs ,DDS  . Contrast Media [Iodinated Diagnostic Agents] Itching and Rash    Procedures  Cardiac catheterization 04/01/2014 Procedure: PTCA and stenting of the mid LAD and first diagonal. FFR of the second OM  Lesion Data:  Vessel: LAD/ first diagonal  Percent stenosis (pre): 90%/90%  TIMI-flow (pre): 3  Stent: 3.0 x 24 mm Promus (LAD), 2.25 x 12 mm Promus (diagonal)  Percent stenosis (post): 0%  TIMI-flow (post): 3  Conclusions:  1. Successful stenting of the mid LAD and first diagonal with DES.  2. FFR of the OM of 0.84.  Recommendations: Continue Brilinta only indefinitely due to history of ASA allergy. Medical therapy for other disease.      Hospital Course  The patient is a 63 year old male with past medical history of coronary artery disease diagnosed by cardiac CTA in 2007 which noted he had moderate plaque the LAD and diagonal distribution. He had a followup stress test which was normal at the time. His last stress test in September 2015 showed anterior wall ischemia with normal EF. He underwent cardiac catheterization on 03/26/2014 showed severe mid LAD and diagonal disease with moderate disease in the left circumflex and severe disease in a very small PLOM branch. With history of aspirin allergy, he was loaded with Brilinta and scheduled for a  staged cardiac catheterization at a later date.   He returned on 04/01/2014 for PCI of LAD and diagonal. He was pretreated with steroids, Benadryl, and Pepcid as he had contrast allergy with rash in the past. Patient underwent cardiac catheterization on 04/01/2014 and had successful stenting of the mid LAD and first diagonal with drug-eluting stent x 2. Following intervention, he had TIMI grade 3 flow in both area. He underwent FFR of OM which was 0.84 which was not significant and does not need PCI at this time. According to Dr. Martinique, he will continue on Brilinta only indefinitely due to history of aspirin allergy. He will need medical therapy for other non-obstructive coronary artery disease.  He was seen the morning of 04/02/2014, at which time he had slight itching with the rash on his chest. This is likely contrast reaction. Patient was given prednisone 60 mg in the morning and per Dr. Martinique will need prednisone 60 mg daily for the next 3 days along with possible Benadryl and Pepcid to control her contrast allergy. He is deemed stable for discharge from cardiology perspective to followup with Dr. Martinique in the [facility] office. Of note, patient does have some bradycardia overnight, however has been asymptomatic on low-dose beta blocker.    Discharge Vitals Blood pressure 167/84, pulse 54, temperature 97.5 F (36.4 C), temperature source Oral, resp. rate 18, height 5\' 10"  (1.778 m), weight 194 lb (87.998 kg), SpO2 96.00%.  Filed Weights   04/01/14 0706  Weight: 194 lb (87.998 kg)    Labs  CBC  Recent Labs  04/01/14 0712 04/02/14 0550  WBC 14.9* 12.1*  HGB 15.0 13.5  HCT 42.8 39.3  MCV 94.9 95.2  PLT 249 983   Basic Metabolic Panel  Recent Labs  04/01/14 0712 04/02/14 0550  NA 141 138  K 4.0 3.8  CL 104 103  CO2 24 25  GLUCOSE 168* 135*  BUN 18 18  CREATININE 0.86 0.80  CALCIUM 9.5 8.4    Disposition  Pt is being discharged home today in good condition.  Follow-up  Plans & Appointments      Follow-up Information   Follow up with [name], MD On 04/16/2014. (4:45pm)    Specialty:  Cardiology   Contact information:   [address] [place] [address] [phone]       Discharge Medications    Medication List         atorvastatin 80 MG tablet  Commonly known as:  LIPITOR  Take 1 tablet (80 mg total) by mouth daily.     carvedilol 3.125 MG tablet  Commonly known as:  COREG  Take 3.125 mg by mouth 2 (two) times daily with a meal.     CINNAMON PO  Take 2 tablets by mouth daily.     CO Q 10 PO  Take 1 tablet by mouth daily.     diphenhydrAMINE 25 mg capsule  Commonly known as:  BENADRYL  Take 1 capsule (25 mg total) by mouth every 6 (six) hours as needed.     famotidine 10 MG tablet  Commonly known as:  PEPCID AC  Take 10 mg daily     irbesartan 300 MG tablet  Commonly known as:  AVAPRO  Take 300 mg by mouth daily.     metFORMIN 500 MG tablet  Commonly known as:  GLUCOPHAGE  Take 500 mg by mouth 2 (two) times daily with a meal.     multivitamin with minerals tablet  Take 1 tablet by mouth daily.     ONE TOUCH ULTRA TEST test strip  Generic drug:  glucose blood  Check blood sugar once daily     ONETOUCH DELICA LANCETS FINE Misc  1 application by Does not apply route daily.     predniSONE 20 MG tablet  Commonly known as:  DELTASONE  Take 60 mg ( 3 tablets ) for 3 days then stop     spironolactone 25 MG tablet  Commonly known as:  ALDACTONE  Take 1 tablet (25 mg total) by mouth daily.     ticagrelor 90 MG Tabs tablet  Commonly known as:  BRILINTA  Take 1 tablet (90 mg total) by mouth 2 (two) times daily.     VITAMIN B 12 PO  Take 1 tablet by mouth daily.        Duration of Discharge Encounter   Greater than 30 minutes including physician time.  Hilbert Corrigan PA-C Pager: [phone] 04/02/2014, 9:11 AM

## 2014-04-03 ENCOUNTER — Telehealth: Payer: Self-pay | Admitting: Cardiology

## 2014-04-03 NOTE — Telephone Encounter (Signed)
Pt called in stating that these medications need to be faxed to the [facility] for a 90 day supply:  Irbesartan Spironolactone Carvedilol Atorvastatin Metformin  He also stated that when was here for  an OV was suppose to receive a prescription for NTG but did not, he would like that called in to the [facility]. He would like a call confirming that these meds were sent in. \  Thanks

## 2014-04-04 ENCOUNTER — Other Ambulatory Visit: Payer: Self-pay

## 2014-04-04 DIAGNOSIS — I10 Essential (primary) hypertension: Secondary | ICD-10-CM

## 2014-04-04 MED ORDER — IRBESARTAN 300 MG PO TABS: 300.0000 mg | ORAL_TABLET | Freq: Every day | ORAL | Status: DC

## 2014-04-04 MED ORDER — ATORVASTATIN CALCIUM 80 MG PO TABS: 80.0000 mg | ORAL_TABLET | Freq: Every day | ORAL | Status: DC

## 2014-04-04 MED ORDER — SPIRONOLACTONE 25 MG PO TABS: 25.0000 mg | ORAL_TABLET | Freq: Every day | ORAL | Status: DC

## 2014-04-04 MED ORDER — CARVEDILOL 3.125 MG PO TABS: 3.1250 mg | ORAL_TABLET | Freq: Two times a day (BID) | ORAL | Status: DC

## 2014-04-04 NOTE — Telephone Encounter (Signed)
We do not refill metformin the RN has to do this all other meds was filled. Cheryl Please refill this

## 2014-04-04 NOTE — Telephone Encounter (Signed)
Returned call to patient advised he will need to have metformin refilled by PCP.Stated he was doing good.Advised to call back if needed.

## 2014-04-07 ENCOUNTER — Other Ambulatory Visit: Payer: Self-pay

## 2014-04-07 MED ORDER — METFORMIN HCL 500 MG PO TABS: 500.0000 mg | ORAL_TABLET | Freq: Two times a day (BID) | ORAL | Status: DC

## 2014-04-08 ENCOUNTER — Telehealth: Payer: Self-pay | Admitting: Cardiology

## 2014-04-08 DIAGNOSIS — I10 Essential (primary) hypertension: Secondary | ICD-10-CM

## 2014-04-08 MED ORDER — CARVEDILOL 3.125 MG PO TABS: 3.1250 mg | ORAL_TABLET | Freq: Two times a day (BID) | ORAL | Status: DC

## 2014-04-08 MED ORDER — IRBESARTAN 300 MG PO TABS: 300.0000 mg | ORAL_TABLET | Freq: Every day | ORAL | Status: DC

## 2014-04-08 MED ORDER — ATORVASTATIN CALCIUM 80 MG PO TABS: 80.0000 mg | ORAL_TABLET | Freq: Every day | ORAL | Status: DC

## 2014-04-08 MED ORDER — SPIRONOLACTONE 25 MG PO TABS: 25.0000 mg | ORAL_TABLET | Freq: Every day | ORAL | Status: DC

## 2014-04-08 NOTE — Telephone Encounter (Signed)
Received a call from patient he stated he needed 90 day refills sent to [facility].Refills sent to pharmacy.

## 2014-04-09 ENCOUNTER — Ambulatory Visit: Payer: 59 | Admitting: Cardiology

## 2014-04-16 ENCOUNTER — Ambulatory Visit (INDEPENDENT_AMBULATORY_CARE_PROVIDER_SITE_OTHER): Payer: 59 | Admitting: Cardiology

## 2014-04-16 ENCOUNTER — Encounter: Payer: Self-pay | Admitting: Cardiology

## 2014-04-16 ENCOUNTER — Other Ambulatory Visit: Payer: Self-pay | Admitting: *Deleted

## 2014-04-16 VITALS — BP 148/90 | HR 56 | Ht 70.0 in | Wt 186.6 lb

## 2014-04-16 DIAGNOSIS — E785 Hyperlipidemia, unspecified: Secondary | ICD-10-CM

## 2014-04-16 DIAGNOSIS — I2510 Atherosclerotic heart disease of native coronary artery without angina pectoris: Secondary | ICD-10-CM

## 2014-04-16 DIAGNOSIS — I10 Essential (primary) hypertension: Secondary | ICD-10-CM

## 2014-04-16 MED ORDER — NITROGLYCERIN 0.4 MG SL SUBL: 0.4000 mg | SUBLINGUAL_TABLET | SUBLINGUAL | Status: DC | PRN

## 2014-04-16 NOTE — Patient Instructions (Signed)
Continue your current therapy   I will see you in 3 months. 

## 2014-04-16 NOTE — Progress Notes (Signed)
[name] [name] Date of Birth: December 27, 1950 Medical Record #[id number]  History of Present Illness: [name] [name] is seen for cardiac followup today. He has a history of coronary disease diagnosed by cardiac CTA in 2007. He was noted to have moderate plaque in the LAD and diagonal distribution. Calcium score was high. It is estimated at 50% LAD lesion proximally. He had a stress test in August 2012 was normal. In September he presented with chest pain. Myoview study demonstrated anterior wall ischemia. Subsequent cardiac cath demonstrated severe disease in the mid LAD and first diagonal with 50-70% disease in the OM2. On 04/01/14 he had successful stenting of the LAD and diagonal with DES., FFR of the OM was normal. Despite aggressive pretreatment for dye allergy with steroids, pepcid, and benadryl he still developed hives adn diarrhea post procedure. He received a tapered dose of prednisone and benadryl and symptoms resolved. He denies any chest pain and feels well.   Current Outpatient Prescriptions on File Prior to Visit  Medication Sig Dispense Refill  . atorvastatin (LIPITOR) 80 MG tablet Take 1 tablet (80 mg total) by mouth daily.  90 tablet  3  . carvedilol (COREG) 3.125 MG tablet Take 1 tablet (3.125 mg total) by mouth 2 (two) times daily with a meal.  180 tablet  3  . CINNAMON PO Take 2 tablets by mouth daily.      . Coenzyme Q10 (CO Q 10 PO) Take 1 tablet by mouth daily.      . Cyanocobalamin (VITAMIN B 12 PO) Take 1 tablet by mouth daily.      . irbesartan (AVAPRO) 300 MG tablet Take 1 tablet (300 mg total) by mouth daily.  90 tablet  3  . metFORMIN (GLUCOPHAGE) 500 MG tablet Take 1 tablet (500 mg total) by mouth 2 (two) times daily with a meal.  180 tablet  1  . Multiple Vitamins-Minerals (MULTIVITAMIN WITH MINERALS) tablet Take 1 tablet by mouth daily.      . ONE TOUCH ULTRA TEST test strip Check blood sugar once daily  100 each  12  . ONETOUCH DELICA LANCETS FINE MISC 1 application by Does not  apply route daily.  100 each  12  . spironolactone (ALDACTONE) 25 MG tablet Take 1 tablet (25 mg total) by mouth daily.  90 tablet  3  . ticagrelor (BRILINTA) 90 MG TABS tablet Take 1 tablet (90 mg total) by mouth 2 (two) times daily.  60 tablet  11   No current facility-administered medications on file prior to visit.    Allergies  Allergen Reactions  . Aspirin     Angioedema  . Norvasc [Amlodipine Besylate]     09/12/2013 gingival hyperplasia , Dr Geralynn Ochs ,DDS  . Contrast Media [Iodinated Diagnostic Agents] Itching and Rash    Past Medical History  Diagnosis Date  . GERD (gastroesophageal reflux disease)     esophageal stricture, pmh  . HTN (hypertension)   . HLD (hyperlipidemia)   . Amblyopia   . CAD (coronary artery disease)     cardiac CT with a 50% LAD lesion in 2007  . Hypopotassemia   . Esophageal stricture 2003  . Diverticulosis   . Adenomatous colon polyp 08/10/04  . Elevated LFTs     secondary to labetalol  . Hiatal hernia 01/04/00  . External hemorrhoids 2012    lanced by Dr Rise Patience  . Family history of anesthesia complication     "sister had PONV"  . DM2 (diabetes mellitus, type 2)  Past Surgical History  Procedure Laterality Date  . Esophagogastroduodenoscopy  2003    ERD, stricture dilated  . Colonoscopy  2006    tics , ADENOMATOUS polyps. F/U due 2011  . Posterior laminectomy / decompression lumbar spine  12/2009    L4 , Dr Sherwood Gambler  . Colonoscopy  2012    Tics  . Back surgery    . Cardiac catheterization  2007; 03/26/2014  . Coronary angioplasty with stent placement  04/01/2014    "2"  . Esophageal dilation  2003  . Excisional hemorrhoidectomy  ~ 2008    History  Smoking status  . Never Smoker   Smokeless tobacco  . Never Used    History  Alcohol Use  . 1.2 oz/week  . 2 Cans of beer per week    Family History  Problem Relation Age of Onset  . Heart attack Mother 10  . Stroke Mother 53  . Prostate cancer Father   . Diabetes  Father   . Heart disease Father 51    CBAG   . Prostate cancer Father   . Diabetes Brother   . Breast cancer Maternal Aunt   . Breast cancer Maternal Aunt     breast  . Heart attack Maternal Grandfather 61  . Diabetes Paternal Grandmother   . Stroke Paternal Grandfather 41    Review of Systems: As noted in HPI. All other systems were reviewed and are negative.  Physical Exam: BP 148/90  Pulse 56  Ht 5\' 10"  (1.778 m)  Wt 186 lb 9.6 oz (84.641 kg)  BMI 26.77 kg/m2 he has lost 8 lbs.  He is a pleasant white male in no acute distress. He is overweight. HEENT: Normal. Neck is supple without JVD, adenopathy, thyromegaly, or bruits. Lungs: Clear Cardiovascular: Regular rate and rhythm. Normal S1 and S2. No gallop, murmur, or click. Abdomen: Obese, soft, and nontender. No hepatosplenomegaly. No masses or bruits. Extremities: No cyanosis or edema. Pulses are 2+ and symmetric throughout. No hematoma at radial cath site.  Skin: Warm and dry Neuro: Alert and oriented x3. Cranial nerves II through XII are intact. No focal motor or sensory deficits.  LABORATORY DATA: Lab Results  Component Value Date   WBC 12.1* 04/02/2014   HGB 13.5 04/02/2014   HCT 39.3 04/02/2014   PLT 226 04/02/2014   GLUCOSE 135* 04/02/2014   CHOL 144 03/19/2014   TRIG 106.0 03/19/2014   HDL 38.30* 03/19/2014   LDLCALC 85 03/19/2014   ALT 18 03/19/2014   AST 15 03/19/2014   NA 138 04/02/2014   K 3.8 04/02/2014   CL 103 04/02/2014   CREATININE 0.80 04/02/2014   BUN 18 04/02/2014   CO2 25 04/02/2014   TSH 1.09 03/19/2014   PSA 1.17 12/25/2012   INR 1.0 03/19/2014   HGBA1C 6.6* 03/19/2014   MICROALBUR 0.8 03/19/2014    Assessment / Plan: 1. Coronary disease s/p DES stenting of the LAD and first diagonal. Normal FFR of OM2. Continue Brilinta alone. Patient is allergic to ASA. Continue risk factor modification.  2. Hyperlipidemia. Goal LDL of  less than or equal to 70. Currently on high dose lipitor. Labs followed by primary.  Continue dietary modification and weight loss.  3. Diabetes mellitus type 2.  4. Hypertension-controlled.  5. Contrast dye allergy with hives. Now resolved.

## 2014-06-05 ENCOUNTER — Encounter (HOSPITAL_COMMUNITY): Payer: Self-pay | Admitting: Cardiology

## 2014-07-17 ENCOUNTER — Encounter: Payer: Self-pay | Admitting: Cardiology

## 2014-07-17 ENCOUNTER — Ambulatory Visit (INDEPENDENT_AMBULATORY_CARE_PROVIDER_SITE_OTHER): Payer: 59 | Admitting: Cardiology

## 2014-07-17 VITALS — BP 140/82 | HR 56 | Ht 70.0 in | Wt 189.9 lb

## 2014-07-17 DIAGNOSIS — E785 Hyperlipidemia, unspecified: Secondary | ICD-10-CM

## 2014-07-17 DIAGNOSIS — E669 Obesity, unspecified: Secondary | ICD-10-CM

## 2014-07-17 DIAGNOSIS — I2510 Atherosclerotic heart disease of native coronary artery without angina pectoris: Secondary | ICD-10-CM

## 2014-07-17 DIAGNOSIS — E1169 Type 2 diabetes mellitus with other specified complication: Secondary | ICD-10-CM

## 2014-07-17 DIAGNOSIS — E119 Type 2 diabetes mellitus without complications: Secondary | ICD-10-CM

## 2014-07-17 NOTE — Patient Instructions (Signed)
We will check fasting lab work  Continue your current therapy  I will see you in 6 months.

## 2014-07-18 NOTE — Progress Notes (Signed)
[name] [name] Date of Birth: December 12, 1950 Medical Record #[id number]  History of Present Illness: [name] [name] is seen for cardiac followup CAD. He has a history of coronary disease diagnosed by cardiac CTA in 2007. In September 2015 he presented with chest pain. Myoview study demonstrated anterior wall ischemia. Subsequent cardiac cath demonstrated severe disease in the mid LAD and first diagonal with 50-70% disease in the OM2. On 04/01/14 he had successful stenting of the LAD and diagonal with DES. FFR of the OM was normal. Despite aggressive pretreatment for dye allergy with steroids, pepcid, and benadryl he still developed hives and diarrhea post procedure.  On follow up today he reports he is doing well. No chest pain or SOB. He is walking some. Overall feels well.  Current Outpatient Prescriptions on File Prior to Visit  Medication Sig Dispense Refill  . atorvastatin (LIPITOR) 80 MG tablet Take 1 tablet (80 mg total) by mouth daily. 90 tablet 3  . carvedilol (COREG) 3.125 MG tablet Take 1 tablet (3.125 mg total) by mouth 2 (two) times daily with a meal. 180 tablet 3  . CINNAMON PO Take 2 tablets by mouth daily.    . Coenzyme Q10 (CO Q 10 PO) Take 1 tablet by mouth daily.    . Cyanocobalamin (VITAMIN B 12 PO) Take 1 tablet by mouth daily.    . irbesartan (AVAPRO) 300 MG tablet Take 1 tablet (300 mg total) by mouth daily. 90 tablet 3  . metFORMIN (GLUCOPHAGE) 500 MG tablet Take 1 tablet (500 mg total) by mouth 2 (two) times daily with a meal. 180 tablet 1  . Multiple Vitamins-Minerals (MULTIVITAMIN WITH MINERALS) tablet Take 1 tablet by mouth daily.    . nitroGLYCERIN (NITROSTAT) 0.4 MG SL tablet Place 1 tablet (0.4 mg total) under the tongue every 5 (five) minutes as needed for chest pain. 25 tablet 3  . ONE TOUCH ULTRA TEST test strip Check blood sugar once daily 100 each 12  . ONETOUCH DELICA LANCETS FINE MISC 1 application by Does not apply route daily. 100 each 12  . spironolactone (ALDACTONE)  25 MG tablet Take 1 tablet (25 mg total) by mouth daily. 90 tablet 3  . ticagrelor (BRILINTA) 90 MG TABS tablet Take 1 tablet (90 mg total) by mouth 2 (two) times daily. 60 tablet 11   No current facility-administered medications on file prior to visit.    Allergies  Allergen Reactions  . Aspirin     Angioedema  . Norvasc [Amlodipine Besylate]     09/12/2013 gingival hyperplasia , Dr Geralynn Ochs ,DDS  . Contrast Media [Iodinated Diagnostic Agents] Itching and Rash    Past Medical History  Diagnosis Date  . GERD (gastroesophageal reflux disease)     esophageal stricture, pmh  . HTN (hypertension)   . HLD (hyperlipidemia)   . Amblyopia   . CAD (coronary artery disease)     cardiac CT with a 50% LAD lesion in 2007  . Hypopotassemia   . Esophageal stricture 2003  . Diverticulosis   . Adenomatous colon polyp 08/10/04  . Elevated LFTs     secondary to labetalol  . Hiatal hernia 01/04/00  . External hemorrhoids 2012    lanced by Dr Rise Patience  . Family history of anesthesia complication     "sister had PONV"  . DM2 (diabetes mellitus, type 2)     Past Surgical History  Procedure Laterality Date  . Esophagogastroduodenoscopy  2003    ERD, stricture dilated  . Colonoscopy  2006  tics , ADENOMATOUS polyps. F/U due 2011  . Posterior laminectomy / decompression lumbar spine  12/2009    L4 , Dr Sherwood Gambler  . Colonoscopy  2012    Tics  . Back surgery    . Cardiac catheterization  2007; 03/26/2014  . Coronary angioplasty with stent placement  04/01/2014    "2"  . Esophageal dilation  2003  . Excisional hemorrhoidectomy  ~ 2008  . Left heart catheterization with coronary angiogram N/A 03/26/2014    Procedure: LEFT HEART CATHETERIZATION WITH CORONARY ANGIOGRAM;  Surgeon: Peter M Martinique, MD;  Location: [facility] CATH LAB;  Service: Cardiovascular;  Laterality: N/A;  . Percutaneous coronary stent intervention (pci-s) N/A 04/01/2014    Procedure: PERCUTANEOUS CORONARY STENT INTERVENTION (PCI-S);   Surgeon: Peter M Martinique, MD;  Location: [facility] CATH LAB;  Service: Cardiovascular;  Laterality: N/A;    History  Smoking status  . Never Smoker   Smokeless tobacco  . Never Used    History  Alcohol Use  . 1.2 oz/week  . 2 Cans of beer per week    Family History  Problem Relation Age of Onset  . Heart attack Mother 74  . Stroke Mother 56  . Prostate cancer Father   . Diabetes Father   . Heart disease Father 69    CBAG   . Prostate cancer Father   . Diabetes Brother   . Breast cancer Maternal Aunt   . Breast cancer Maternal Aunt     breast  . Heart attack Maternal Grandfather 61  . Diabetes Paternal Grandmother   . Stroke Paternal Grandfather 56    Review of Systems: As noted in HPI. All other systems were reviewed and are negative.  Physical Exam: BP 140/82 mmHg  Pulse 56  Ht 5\' 10"  (1.778 m)  Wt 189 lb 14.4 oz (86.138 kg)  BMI 27.25 kg/m2 he has lost 8 lbs.  He is a pleasant white male in no acute distress. He is overweight. HEENT: Normal. Neck is supple without JVD, adenopathy, thyromegaly, or bruits. Lungs: Clear Cardiovascular: Regular rate and rhythm. Normal S1 and S2. No gallop, murmur, or click. Abdomen: Obese, soft, and nontender. No hepatosplenomegaly. No masses or bruits. Extremities: No cyanosis or edema. Pulses are 2+ and symmetric throughout. No hematoma at radial cath site.  Skin: Warm and dry Neuro: Alert and oriented x3. Cranial nerves II through XII are intact. No focal motor or sensory deficits.  LABORATORY DATA: Lab Results  Component Value Date   WBC 12.1* 04/02/2014   HGB 13.5 04/02/2014   HCT 39.3 04/02/2014   PLT 226 04/02/2014   GLUCOSE 135* 04/02/2014   CHOL 144 03/19/2014   TRIG 106.0 03/19/2014   HDL 38.30* 03/19/2014   LDLCALC 85 03/19/2014   ALT 18 03/19/2014   AST 15 03/19/2014   NA 138 04/02/2014   K 3.8 04/02/2014   CL 103 04/02/2014   CREATININE 0.80 04/02/2014   BUN 18 04/02/2014   CO2 25 04/02/2014   TSH 1.09  03/19/2014   PSA 1.17 12/25/2012   INR 1.0 03/19/2014   HGBA1C 6.6* 03/19/2014   MICROALBUR 0.8 03/19/2014    Assessment / Plan: 1. Coronary disease s/p DES stenting of the LAD and first diagonal. Normal FFR of OM2. Continue Brilinta alone. Patient is allergic to ASA. Continue risk factor modification.  2. Hyperlipidemia. Goal LDL of  less than or equal to 70. Currently on high dose lipitor. Will schedule for fasting chemistries and lipid panel. Continue dietary modification  and weight loss.  3. Diabetes mellitus type 2. Will check A1c.   4. Hypertension-controlled.  5. Contrast dye allergy with hives.

## 2014-07-24 LAB — HEPATIC FUNCTION PANEL
ALT: 54 U/L — AB (ref 0–53)
AST: 28 U/L (ref 0–37)
Albumin: 4.3 g/dL (ref 3.5–5.2)
Alkaline Phosphatase: 82 U/L (ref 39–117)
BILIRUBIN DIRECT: 0.2 mg/dL (ref 0.0–0.3)
BILIRUBIN TOTAL: 0.8 mg/dL (ref 0.2–1.2)
Indirect Bilirubin: 0.6 mg/dL (ref 0.2–1.2)
TOTAL PROTEIN: 6.5 g/dL (ref 6.0–8.3)

## 2014-07-24 LAB — BASIC METABOLIC PANEL
BUN: 13 mg/dL (ref 6–23)
CALCIUM: 9.6 mg/dL (ref 8.4–10.5)
CO2: 26 meq/L (ref 19–32)
Chloride: 103 mEq/L (ref 96–112)
Creat: 0.82 mg/dL (ref 0.50–1.35)
Glucose, Bld: 121 mg/dL — ABNORMAL HIGH (ref 70–99)
Potassium: 4.2 mEq/L (ref 3.5–5.3)
Sodium: 139 mEq/L (ref 135–145)

## 2014-07-24 LAB — HEMOGLOBIN A1C
Hgb A1c MFr Bld: 6.5 % — ABNORMAL HIGH (ref <5.7)
Mean Plasma Glucose: 140 mg/dL — ABNORMAL HIGH (ref <117)

## 2014-07-24 LAB — LIPID PANEL
CHOL/HDL RATIO: 3 ratio
CHOLESTEROL: 115 mg/dL (ref 0–200)
HDL: 38 mg/dL — ABNORMAL LOW (ref >39)
LDL CALC: 54 mg/dL (ref 0–99)
Triglycerides: 114 mg/dL (ref <150)
VLDL: 23 mg/dL (ref 0–40)

## 2014-09-12 ENCOUNTER — Other Ambulatory Visit: Payer: Self-pay | Admitting: Internal Medicine

## 2014-10-21 ENCOUNTER — Telehealth: Payer: Self-pay

## 2014-10-21 NOTE — Telephone Encounter (Signed)
Prior auth for [employer] approved through [employer]. [id number].

## 2015-01-13 ENCOUNTER — Ambulatory Visit (INDEPENDENT_AMBULATORY_CARE_PROVIDER_SITE_OTHER): Payer: 59 | Admitting: Cardiology

## 2015-01-13 ENCOUNTER — Encounter: Payer: Self-pay | Admitting: Cardiology

## 2015-01-13 VITALS — BP 120/76 | HR 58 | Ht 69.5 in | Wt 186.0 lb

## 2015-01-13 DIAGNOSIS — I2510 Atherosclerotic heart disease of native coronary artery without angina pectoris: Secondary | ICD-10-CM | POA: Diagnosis not present

## 2015-01-13 DIAGNOSIS — E669 Obesity, unspecified: Secondary | ICD-10-CM | POA: Diagnosis not present

## 2015-01-13 DIAGNOSIS — E119 Type 2 diabetes mellitus without complications: Secondary | ICD-10-CM | POA: Diagnosis not present

## 2015-01-13 DIAGNOSIS — I10 Essential (primary) hypertension: Secondary | ICD-10-CM

## 2015-01-13 DIAGNOSIS — E1169 Type 2 diabetes mellitus with other specified complication: Secondary | ICD-10-CM

## 2015-01-13 NOTE — Patient Instructions (Signed)
Continue your current therapy  I will see you in 6 months with fasting lab work.   

## 2015-01-13 NOTE — Progress Notes (Signed)
[name] [name] Date of Birth: 26-Dec-1950 Medical Record #[id number]  History of Present Illness: [name] [name] is seen for cardiac followup CAD. He has a history of coronary disease diagnosed by cardiac CTA in 2007. In September 2015 he presented with chest pain. Myoview study demonstrated anterior wall ischemia. Subsequent cardiac cath demonstrated severe disease in the mid LAD and first diagonal with 50-70% disease in the OM2. On 04/01/14 he had successful stenting of the LAD and diagonal with DES. FFR of the OM was normal. Despite aggressive pretreatment for dye allergy with steroids, pepcid, and benadryl he still developed hives and diarrhea post procedure.  On follow up today he reports he is doing well. No chest pain or SOB. He is walking some and playing golf. He does note some transient dizziness if he bends over and stands up quickly. Tries to monitor diet and has lost 4 lbs.  Current Outpatient Prescriptions on File Prior to Visit  Medication Sig Dispense Refill  . atorvastatin (LIPITOR) 80 MG tablet Take 1 tablet (80 mg total) by mouth daily. 90 tablet 3  . carvedilol (COREG) 3.125 MG tablet Take 1 tablet (3.125 mg total) by mouth 2 (two) times daily with a meal. 180 tablet 3  . CINNAMON PO Take 2 tablets by mouth daily.    . Coenzyme Q10 (CO Q 10 PO) Take 1 tablet by mouth daily.    . Cyanocobalamin (VITAMIN B 12 PO) Take 1 tablet by mouth daily.    . irbesartan (AVAPRO) 300 MG tablet Take 1 tablet (300 mg total) by mouth daily. 90 tablet 3  . metFORMIN (GLUCOPHAGE) 500 MG tablet Take 1 tablet by mouth  twice a day with a meal 180 tablet 2  . Multiple Vitamins-Minerals (MULTIVITAMIN WITH MINERALS) tablet Take 1 tablet by mouth daily.    . nitroGLYCERIN (NITROSTAT) 0.4 MG SL tablet Place 1 tablet (0.4 mg total) under the tongue every 5 (five) minutes as needed for chest pain. 25 tablet 3  . Omega-3 Fatty Acids (FISH OIL) 1200 MG CPDR Take 1,200 mg by mouth 2 (two) times daily.    . ONE TOUCH  ULTRA TEST test strip Check blood sugar once daily 100 each 12  . ONETOUCH DELICA LANCETS FINE MISC 1 application by Does not apply route daily. 100 each 12  . spironolactone (ALDACTONE) 25 MG tablet Take 1 tablet (25 mg total) by mouth daily. 90 tablet 3  . ticagrelor (BRILINTA) 90 MG TABS tablet Take 1 tablet (90 mg total) by mouth 2 (two) times daily. 60 tablet 11   No current facility-administered medications on file prior to visit.    Allergies  Allergen Reactions  . Aspirin     Angioedema  . Norvasc [Amlodipine Besylate]     09/12/2013 gingival hyperplasia , Dr Geralynn Ochs ,DDS  . Contrast Media [Iodinated Diagnostic Agents] Itching and Rash    Past Medical History  Diagnosis Date  . GERD (gastroesophageal reflux disease)     esophageal stricture, pmh  . HTN (hypertension)   . HLD (hyperlipidemia)   . Amblyopia   . CAD (coronary artery disease)     cardiac CT with a 50% LAD lesion in 2007  . Hypopotassemia   . Esophageal stricture 2003  . Diverticulosis   . Adenomatous colon polyp 08/10/04  . Elevated LFTs     secondary to labetalol  . Hiatal hernia 01/04/00  . External hemorrhoids 2012    lanced by Dr Rise Patience  . Family history of anesthesia complication     "  sister had PONV"  . DM2 (diabetes mellitus, type 2)     Past Surgical History  Procedure Laterality Date  . Esophagogastroduodenoscopy  2003    ERD, stricture dilated  . Colonoscopy  2006    tics , ADENOMATOUS polyps. F/U due 2011  . Posterior laminectomy / decompression lumbar spine  12/2009    L4 , Dr Sherwood Gambler  . Colonoscopy  2012    Tics  . Back surgery    . Cardiac catheterization  2007; 03/26/2014  . Coronary angioplasty with stent placement  04/01/2014    "2"  . Esophageal dilation  2003  . Excisional hemorrhoidectomy  ~ 2008  . Left heart catheterization with coronary angiogram N/A 03/26/2014    Procedure: LEFT HEART CATHETERIZATION WITH CORONARY ANGIOGRAM;  Surgeon: Tahjay Binion M Martinique, MD;  Location: [facility]  CATH LAB;  Service: Cardiovascular;  Laterality: N/A;  . Percutaneous coronary stent intervention (pci-s) N/A 04/01/2014    Procedure: PERCUTANEOUS CORONARY STENT INTERVENTION (PCI-S);  Surgeon: Elo Marmolejos M Martinique, MD;  Location: [facility] CATH LAB;  Service: Cardiovascular;  Laterality: N/A;    History  Smoking status  . Never Smoker   Smokeless tobacco  . Never Used    History  Alcohol Use  . 1.2 oz/week  . 2 Cans of beer per week    Family History  Problem Relation Age of Onset  . Heart attack Mother 18  . Stroke Mother 77  . Prostate cancer Father   . Diabetes Father   . Heart disease Father 10    CBAG   . Prostate cancer Father   . Diabetes Brother   . Breast cancer Maternal Aunt   . Breast cancer Maternal Aunt     breast  . Heart attack Maternal Grandfather 61  . Diabetes Paternal Grandmother   . Stroke Paternal Grandfather 60    Review of Systems: As noted in HPI. All other systems were reviewed and are negative.  Physical Exam: BP 120/76 mmHg  Pulse 58  Ht 5' 9.5" (1.765 m)  Wt 84.369 kg (186 lb)  BMI 27.08 kg/m2 he has lost 8 lbs.  He is a pleasant white male in no acute distress. He is overweight. HEENT: Normal. Neck is supple without JVD, adenopathy, thyromegaly, or bruits. Lungs: Clear Cardiovascular: Regular rate and rhythm. Normal S1 and S2. No gallop, murmur, or click. Abdomen: Obese, soft, and nontender. No hepatosplenomegaly. No masses or bruits. Extremities: No cyanosis or edema. Pulses are 2+ and symmetric throughout. No hematoma at radial cath site.  Skin: Warm and dry Neuro: Alert and oriented x3. Cranial nerves II through XII are intact. No focal motor or sensory deficits.  LABORATORY DATA: Lab Results  Component Value Date   WBC 12.1* 04/02/2014   HGB 13.5 04/02/2014   HCT 39.3 04/02/2014   PLT 226 04/02/2014   GLUCOSE 121* 07/24/2014   CHOL 115 07/24/2014   TRIG 114 07/24/2014   HDL 38* 07/24/2014   LDLCALC 54 07/24/2014   ALT 54*  07/24/2014   AST 28 07/24/2014   NA 139 07/24/2014   K 4.2 07/24/2014   CL 103 07/24/2014   CREATININE 0.82 07/24/2014   BUN 13 07/24/2014   CO2 26 07/24/2014   TSH 1.09 03/19/2014   PSA 1.17 12/25/2012   INR 1.0 03/19/2014   HGBA1C 6.5* 07/24/2014   MICROALBUR 0.8 03/19/2014    Assessment / Plan: 1. Coronary disease s/p DES stenting of the LAD and first diagonal. Normal FFR of OM2. Continue Brilinta alone.  Patient is allergic to ASA. Continue risk factor modification. Will plan to reduce Brilinta to 60 mg bid in October.   2. Hyperlipidemia. Goal LDL of  less than or equal to 70. Currently well controlled on high dose lipitor.  Continue dietary modification and weight loss.  3. Diabetes mellitus type 2. Under fair control. Continue dietary modification.  4. Hypertension-controlled.  5. Contrast dye allergy with hives.

## 2015-02-25 ENCOUNTER — Other Ambulatory Visit: Payer: Self-pay | Admitting: Cardiology

## 2015-02-26 ENCOUNTER — Telehealth: Payer: Self-pay | Admitting: Cardiology

## 2015-02-26 NOTE — Telephone Encounter (Signed)
Returned call to patient he stated he already took care of this months Brilinta.Stated he called his insurance company and got a reduced price.Stated he will call back next month if too expensive.

## 2015-02-26 NOTE — Telephone Encounter (Signed)
Pt called in wanting to know if there is an alternative medication he can take in the place of Brilinta because he says that the price has jumped from $25 to $300 and he can no longer afford that . Please call  Thanks

## 2015-03-03 ENCOUNTER — Telehealth: Payer: Self-pay | Admitting: Cardiology

## 2015-03-03 MED ORDER — TICAGRELOR 90 MG PO TABS: 90.0000 mg | ORAL_TABLET | Freq: Two times a day (BID) | ORAL | Status: DC

## 2015-03-03 NOTE — Telephone Encounter (Signed)
Spoke to patient brilinta 90 mg samples left at [employer] office front desk.

## 2015-03-03 NOTE — Telephone Encounter (Signed)
Received a call from [place] with [employer].Ok 90 day refill for brilinta 53m.

## 2015-03-15 ENCOUNTER — Other Ambulatory Visit: Payer: Self-pay | Admitting: Cardiology

## 2015-03-17 NOTE — Telephone Encounter (Signed)
[name] M Martinique, MD at 01/13/2015 3:40 PM  atorvastatin (LIPITOR) 80 MG tablet Take 1 tablet (80 mg total) by mouth daily carvedilol (COREG) 3.125 MG tabletTake 1 tablet (3.125 mg total) by mouth 2 (two) times daily with a meal irbesartan (AVAPRO) 300 MG tablet Take 1 tablet (300 mg total) by mouth daily spironolactone (ALDACTONE) 25 MG tablet Take 1 tablet (25 mg total) by mouth daily Patient Instructions  Continue your current therapy  I will see you in 6 months with fasting lab work

## 2015-04-14 ENCOUNTER — Other Ambulatory Visit: Payer: Self-pay | Admitting: Cardiology

## 2015-04-15 ENCOUNTER — Telehealth: Payer: Self-pay

## 2015-04-15 NOTE — Telephone Encounter (Signed)
Returned call to patient no answer.LMTC. 

## 2015-04-15 NOTE — Telephone Encounter (Signed)
Pharmacy request refill on brilinta 90mg . Per 01/13/15 office visit:Assessment / Plan: 1. Coronary disease s/p DES stenting of the LAD and first diagonal. Normal FFR of OM2. Continue Brilinta alone. Patient is allergic to ASA. Continue risk factor modification. Will plan to reduce Brilinta to 60 mg bid in October.   Please advise. Thanks, MI

## 2015-04-15 NOTE — Telephone Encounter (Signed)
Received a call back from patient.He stated he needed a prior authorization for brilinta.Member ID # [id number].Insurance # [id number] or [id number].Message sent to our prior authorization nurse Jenean Lindau.

## 2015-04-15 NOTE — Telephone Encounter (Signed)
Received a call back from patient.He stated he needed a PA for brilinta.Message sent to PA nurse Jenean Lindau.

## 2015-04-15 NOTE — Telephone Encounter (Signed)
Please call,says he need to talk to you about his prescription.

## 2015-04-16 NOTE — Telephone Encounter (Signed)
Received a call from patient he stated he needed PA for Brilinta.[facility] called PA done, waiting for pharmacy review.Ref # [id number].Will be notified in 14 days of approval.

## 2015-04-17 ENCOUNTER — Telehealth: Payer: Self-pay | Admitting: Cardiology

## 2015-04-17 ENCOUNTER — Other Ambulatory Visit: Payer: Self-pay | Admitting: Cardiology

## 2015-04-17 NOTE — Telephone Encounter (Signed)
Pt needs prior authorization for his Brilinta. Please call [employer] at [phone].Please call him when you complete this.

## 2015-04-17 NOTE — Telephone Encounter (Signed)
Sent yesterday to [facility]. Waiting on their response

## 2015-04-17 NOTE — Telephone Encounter (Signed)
Don't need this encounter

## 2015-04-17 NOTE — Telephone Encounter (Signed)
Pt need prior authorization.

## 2015-04-29 ENCOUNTER — Telehealth: Payer: Self-pay

## 2015-04-29 NOTE — Telephone Encounter (Signed)
Patient called no answer.Left message on personal voice mail [facility] denied coverage for Brilinta.Dr.Jordan out of office will speak to him next week and call you back.

## 2015-05-05 ENCOUNTER — Telehealth: Payer: Self-pay

## 2015-05-05 NOTE — Telephone Encounter (Signed)
Spoke to patient he stated he wants to continue brilinta even though insurance has denied payment.Stated he will call back if he wants to change.

## 2015-06-07 ENCOUNTER — Other Ambulatory Visit: Payer: Self-pay | Admitting: Internal Medicine

## 2015-06-08 NOTE — Telephone Encounter (Signed)
LVM for pt to call back, pts last OV 9/15. Refill will be sent in once appt is scheduled.

## 2015-07-10 LAB — HEPATIC FUNCTION PANEL
ALT: 24 U/L (ref 9–46)
AST: 18 U/L (ref 10–35)
Albumin: 4.2 g/dL (ref 3.6–5.1)
Alkaline Phosphatase: 65 U/L (ref 40–115)
BILIRUBIN DIRECT: 0.1 mg/dL (ref <=0.2)
BILIRUBIN INDIRECT: 0.6 mg/dL (ref 0.2–1.2)
Total Bilirubin: 0.7 mg/dL (ref 0.2–1.2)
Total Protein: 6.3 g/dL (ref 6.1–8.1)

## 2015-07-10 LAB — BASIC METABOLIC PANEL
BUN: 12 mg/dL (ref 7–25)
CALCIUM: 9.5 mg/dL (ref 8.6–10.3)
CO2: 26 mmol/L (ref 20–31)
CREATININE: 1 mg/dL (ref 0.70–1.25)
Chloride: 103 mmol/L (ref 98–110)
Glucose, Bld: 113 mg/dL — ABNORMAL HIGH (ref 65–99)
Potassium: 4.5 mmol/L (ref 3.5–5.3)
Sodium: 143 mmol/L (ref 135–146)

## 2015-07-10 LAB — LIPID PANEL
Cholesterol: 123 mg/dL — ABNORMAL LOW (ref 125–200)
HDL: 44 mg/dL (ref >=40)
LDL Cholesterol: 55 mg/dL (ref <130)
Total CHOL/HDL Ratio: 2.8 Ratio (ref <=5.0)
Triglycerides: 119 mg/dL (ref <150)
VLDL: 24 mg/dL (ref <30)

## 2015-07-14 ENCOUNTER — Encounter: Payer: Self-pay | Admitting: Cardiology

## 2015-07-14 ENCOUNTER — Ambulatory Visit (INDEPENDENT_AMBULATORY_CARE_PROVIDER_SITE_OTHER): Payer: 59 | Admitting: Cardiology

## 2015-07-14 VITALS — BP 110/78 | HR 56 | Ht 69.5 in | Wt 188.0 lb

## 2015-07-14 DIAGNOSIS — I25110 Atherosclerotic heart disease of native coronary artery with unstable angina pectoris: Secondary | ICD-10-CM | POA: Diagnosis not present

## 2015-07-14 DIAGNOSIS — I2510 Atherosclerotic heart disease of native coronary artery without angina pectoris: Secondary | ICD-10-CM

## 2015-07-14 DIAGNOSIS — I10 Essential (primary) hypertension: Secondary | ICD-10-CM

## 2015-07-14 DIAGNOSIS — E785 Hyperlipidemia, unspecified: Secondary | ICD-10-CM | POA: Diagnosis not present

## 2015-07-14 MED ORDER — CLOPIDOGREL BISULFATE 75 MG PO TABS: 75.0000 mg | ORAL_TABLET | Freq: Every day | ORAL | Status: DC

## 2015-07-14 NOTE — Patient Instructions (Signed)
We will switch Brilinta to Plavix 75 mg daily- allow 3 days of overlap  Continue your other therapy  I will see you in 6 months.

## 2015-07-14 NOTE — Progress Notes (Signed)
[name] [name] Date of Birth: 1951/01/26 Medical Record [id number]  History of Present Illness: [name] [name] is seen for cardiac followup CAD. He has a history of coronary disease diagnosed by cardiac CTA in 2007. In September 2015 he presented with chest pain. Myoview study demonstrated anterior wall ischemia. Subsequent cardiac cath demonstrated severe disease in the mid LAD and first diagonal with 50-70% disease in the OM2. On 04/01/14 he had successful stenting of the LAD and diagonal with DES. FFR of the OM was normal. Despite aggressive pretreatment for dye allergy with steroids, pepcid, and benadryl he still developed hives and diarrhea post procedure.  On follow up today he reports he is doing well. No chest pain or SOB. He is walking  and playing golf. Feels great. Brilinta is no longer affordable due to cost ($1000 for a 90 day supply)  Current Outpatient Prescriptions on File Prior to Visit  Medication Sig Dispense Refill  . atorvastatin (LIPITOR) 80 MG tablet Take 1 tablet by mouth  daily 90 tablet 2  . BRILINTA 90 MG TABS tablet TAKE ONE TABLET BY MOUTH TWICE DAILY 60 tablet 0  . carvedilol (COREG) 3.125 MG tablet Take 1 tablet (3.125 mg) by mouth 2 (two) times daily  with a meal. 180 tablet 2  . CINNAMON PO Take 2 tablets by mouth daily.    . Coenzyme Q10 (CO Q 10 PO) Take 1 tablet by mouth daily.    . Cyanocobalamin (VITAMIN B 12 PO) Take 1 tablet by mouth daily.    . irbesartan (AVAPRO) 300 MG tablet Take 1 tablet by mouth  daily 90 tablet 2  . metFORMIN (GLUCOPHAGE) 500 MG tablet Take 1 tablet by mouth  twice a day with a meal 180 tablet 2  . Multiple Vitamins-Minerals (MULTIVITAMIN WITH MINERALS) tablet Take 1 tablet by mouth daily.    . nitroGLYCERIN (NITROSTAT) 0.4 MG SL tablet Place 1 tablet (0.4 mg total) under the tongue every 5 (five) minutes as needed for chest pain. 25 tablet 3  . Omega-3 Fatty Acids (FISH OIL) 1200 MG CPDR Take 1,200 mg by mouth 2 (two) times daily.    .  ONE TOUCH ULTRA TEST test strip Check blood sugar once daily 100 each 12  . ONETOUCH DELICA LANCETS FINE MISC 1 application by Does not apply route daily. 100 each 12  . spironolactone (ALDACTONE) 25 MG tablet Take 1 tablet by mouth  daily 90 tablet 2   No current facility-administered medications on file prior to visit.    Allergies  Allergen Reactions  . Aspirin     Angioedema  . Norvasc [Amlodipine Besylate]     09/12/2013 gingival hyperplasia , Dr Geralynn Ochs ,DDS  . Contrast Media [Iodinated Diagnostic Agents] Itching and Rash    Past Medical History  Diagnosis Date  . GERD (gastroesophageal reflux disease)     esophageal stricture, pmh  . HTN (hypertension)   . HLD (hyperlipidemia)   . Amblyopia   . CAD (coronary artery disease)     cardiac CT with a 50% LAD lesion in 2007  . Hypopotassemia   . Esophageal stricture 2003  . Diverticulosis   . Adenomatous colon polyp 08/10/04  . Elevated LFTs     secondary to labetalol  . Hiatal hernia 01/04/00  . External hemorrhoids 2012    lanced by Dr Rise Patience  . Family history of anesthesia complication     "sister had PONV"  . DM2 (diabetes mellitus, type 2) (HCC)     Past  Surgical History  Procedure Laterality Date  . Esophagogastroduodenoscopy  2003    ERD, stricture dilated  . Colonoscopy  2006    tics , ADENOMATOUS polyps. F/U due 2011  . Posterior laminectomy / decompression lumbar spine  12/2009    L4 , Dr Sherwood Gambler  . Colonoscopy  2012    Tics  . Back surgery    . Cardiac catheterization  2007; 03/26/2014  . Coronary angioplasty with stent placement  04/01/2014    "2"  . Esophageal dilation  2003  . Excisional hemorrhoidectomy  ~ 2008  . Left heart catheterization with coronary angiogram N/A 03/26/2014    Procedure: LEFT HEART CATHETERIZATION WITH CORONARY ANGIOGRAM;  Surgeon: Que Meneely M Martinique, MD;  Location: [facility] CATH LAB;  Service: Cardiovascular;  Laterality: N/A;  . Percutaneous coronary stent intervention (pci-s) N/A  04/01/2014    Procedure: PERCUTANEOUS CORONARY STENT INTERVENTION (PCI-S);  Surgeon: Alexianna Nachreiner M Martinique, MD;  Location: [facility] CATH LAB;  Service: Cardiovascular;  Laterality: N/A;    History  Smoking status  . Never Smoker   Smokeless tobacco  . Never Used    History  Alcohol Use  . 1.2 oz/week  . 2 Cans of beer per week    Family History  Problem Relation Age of Onset  . Heart attack Mother 40  . Stroke Mother 13  . Prostate cancer Father   . Diabetes Father   . Heart disease Father 29    CBAG   . Prostate cancer Father   . Diabetes Brother   . Breast cancer Maternal Aunt   . Breast cancer Maternal Aunt     breast  . Heart attack Maternal Grandfather 61  . Diabetes Paternal Grandmother   . Stroke Paternal Grandfather 9    Review of Systems: As noted in HPI. All other systems were reviewed and are negative.  Physical Exam: BP 110/78 mmHg  Pulse 56  Ht 5' 9.5" (1.765 m)  Wt 85.276 kg (188 lb)  BMI 27.37 kg/m2  He is a pleasant white male in no acute distress. He is overweight. HEENT: Normal. Neck is supple without JVD, adenopathy, thyromegaly, or bruits. Lungs: Clear Cardiovascular: Regular rate and rhythm. Normal S1 and S2. No gallop, murmur, or click. Abdomen: Obese, soft, and nontender. No hepatosplenomegaly. No masses or bruits. Extremities: No cyanosis or edema. Pulses are 2+ and symmetric throughout. No hematoma at radial cath site.  Skin: Warm and dry Neuro: Alert and oriented x3. Cranial nerves II through XII are intact. No focal motor or sensory deficits.  LABORATORY DATA: Lab Results  Component Value Date   WBC 12.1* 04/02/2014   HGB 13.5 04/02/2014   HCT 39.3 04/02/2014   PLT 226 04/02/2014   GLUCOSE 113* 07/09/2015   CHOL 123* 07/09/2015   TRIG 119 07/09/2015   HDL 44 07/09/2015   LDLCALC 55 07/09/2015   ALT 24 07/09/2015   AST 18 07/09/2015   NA 143 07/09/2015   K 4.5 07/09/2015   CL 103 07/09/2015   CREATININE 1.00 07/09/2015   BUN 12  07/09/2015   CO2 26 07/09/2015   TSH 1.09 03/19/2014   PSA 1.17 12/25/2012   INR 1.0 03/19/2014   HGBA1C 6.5* 07/24/2014   MICROALBUR 0.8 03/19/2014   Ecg shows NSR with rate 56. Normal. I have personally reviewed and interpreted this study.  Assessment / Plan: 1. Coronary disease s/p DES stenting of the LAD and first diagonal. Normal FFR of OM2. Will switch Brilinta to Plavix due to cost.  Patient is allergic to ASA. Continue risk factor modification. May want to consider evaluation for ASA allergy and possible desensitization.   2. Hyperlipidemia. Excellent control on lipitor.   3. Diabetes mellitus type 2. Under improved control. Continue dietary modification.  4. Hypertension-controlled.  5. Contrast dye allergy with hives- despite pretreatment.

## 2015-07-15 ENCOUNTER — Telehealth: Payer: Self-pay | Admitting: Emergency Medicine

## 2015-07-15 ENCOUNTER — Other Ambulatory Visit: Payer: Self-pay | Admitting: Internal Medicine

## 2015-07-15 NOTE — Telephone Encounter (Signed)
LVM for pt to call back to schedule OV. Has not been seen since 02/2014

## 2015-07-16 ENCOUNTER — Other Ambulatory Visit: Payer: Self-pay

## 2015-07-20 ENCOUNTER — Other Ambulatory Visit: Payer: Self-pay | Admitting: *Deleted

## 2015-07-20 MED ORDER — METFORMIN HCL 500 MG PO TABS: 500.0000 mg | ORAL_TABLET | Freq: Two times a day (BID) | ORAL | Status: DC

## 2015-08-10 ENCOUNTER — Encounter: Payer: Self-pay | Admitting: Family

## 2015-08-10 ENCOUNTER — Other Ambulatory Visit (INDEPENDENT_AMBULATORY_CARE_PROVIDER_SITE_OTHER): Payer: 59

## 2015-08-10 ENCOUNTER — Ambulatory Visit (INDEPENDENT_AMBULATORY_CARE_PROVIDER_SITE_OTHER): Payer: 59 | Admitting: Family

## 2015-08-10 VITALS — BP 124/84 | HR 61 | Temp 97.7°F | Resp 18 | Ht 69.5 in | Wt 189.0 lb

## 2015-08-10 DIAGNOSIS — E669 Obesity, unspecified: Secondary | ICD-10-CM

## 2015-08-10 DIAGNOSIS — R6889 Other general symptoms and signs: Secondary | ICD-10-CM | POA: Diagnosis not present

## 2015-08-10 DIAGNOSIS — B078 Other viral warts: Secondary | ICD-10-CM

## 2015-08-10 DIAGNOSIS — E119 Type 2 diabetes mellitus without complications: Secondary | ICD-10-CM

## 2015-08-10 DIAGNOSIS — E1169 Type 2 diabetes mellitus with other specified complication: Secondary | ICD-10-CM

## 2015-08-10 DIAGNOSIS — Z0000 Encounter for general adult medical examination without abnormal findings: Secondary | ICD-10-CM

## 2015-08-10 DIAGNOSIS — B079 Viral wart, unspecified: Secondary | ICD-10-CM | POA: Diagnosis not present

## 2015-08-10 DIAGNOSIS — Z0001 Encounter for general adult medical examination with abnormal findings: Secondary | ICD-10-CM

## 2015-08-10 LAB — HEMOGLOBIN A1C: Hgb A1c MFr Bld: 6.6 % — ABNORMAL HIGH (ref 4.6–6.5)

## 2015-08-10 NOTE — Progress Notes (Addendum)
Subjective:    Patient ID: [name] [name], male    DOB: [birth year], 65 y.o.   MRN: [id number]  Chief Complaint  Patient presents with  . CPE    Not fasting    HPI:  [name] [name] is a 65 y.o. male who presents today for an annual wellness visit.   1) Health Maintenance -   Diet - Averages about 3 meals per day which is primarily focused around processed and fast food. Denies caffeine.   Exercise - Walks 30 minutes-1 hour several times per week.    2) Preventative Exams / Immunizations:  Dental -- Up to date  Vision -- Due exam.    Health Maintenance  Topic Date Due  . Hepatitis C Screening  08-20-1950  . HIV Screening  01/21/1966  . HEMOGLOBIN A1C  01/22/2015  . PNA vac Low Risk Adult (1 of 2 - PCV13) 01/22/2015  . FOOT EXAM  03/20/2015  . OPHTHALMOLOGY EXAM  07/23/2015  . INFLUENZA VACCINE  03/11/2016 (Originally 01/26/2015)  . COLONOSCOPY  04/24/2016  . TETANUS/TDAP  12/19/2018  . ZOSTAVAX  Completed    Immunization History  Administered Date(s) Administered  . Influenza,inj,Quad PF,36+ Mos 03/19/2014  . Td 12/18/2008  . Zoster 12/24/2012    3.) Wart - This is a new problem. Associated symptom of a common wart located on his left index finger has been going on for several months and has been refractory to at home treatments attempted to make it better. Described as small and firm with continued return. Denies trauma.  Allergies  Allergen Reactions  . Aspirin     Angioedema  . Norvasc [Amlodipine Besylate]     09/12/2013 gingival hyperplasia , Dr Geralynn Ochs ,DDS  . Contrast Media [Iodinated Diagnostic Agents] Itching and Rash     Outpatient Prescriptions Prior to Visit  Medication Sig Dispense Refill  . atorvastatin (LIPITOR) 80 MG tablet Take 1 tablet by mouth  daily 90 tablet 2  . carvedilol (COREG) 3.125 MG tablet Take 1 tablet (3.125 mg) by mouth 2 (two) times daily  with a meal. 180 tablet 2  . CINNAMON PO Take 2 tablets by mouth daily.    .  clopidogrel (PLAVIX) 75 MG tablet Take 1 tablet (75 mg total) by mouth daily. 90 tablet 3  . Coenzyme Q10 (CO Q 10 PO) Take 1 tablet by mouth daily.    . Cyanocobalamin (VITAMIN B 12 PO) Take 1 tablet by mouth daily.    . irbesartan (AVAPRO) 300 MG tablet Take 1 tablet by mouth  daily 90 tablet 2  . metFORMIN (GLUCOPHAGE) 500 MG tablet Take 1 tablet (500 mg total) by mouth 2 (two) times daily with a meal. 180 tablet 2  . Multiple Vitamins-Minerals (MULTIVITAMIN WITH MINERALS) tablet Take 1 tablet by mouth daily.    . nitroGLYCERIN (NITROSTAT) 0.4 MG SL tablet Place 1 tablet (0.4 mg total) under the tongue every 5 (five) minutes as needed for chest pain. 25 tablet 3  . Omega-3 Fatty Acids (FISH OIL) 1200 MG CPDR Take 1,200 mg by mouth 2 (two) times daily.    . ONE TOUCH ULTRA TEST test strip Check blood sugar once daily 100 each 12  . ONETOUCH DELICA LANCETS FINE MISC 1 application by Does not apply route daily. 100 each 12  . spironolactone (ALDACTONE) 25 MG tablet Take 1 tablet by mouth  daily 90 tablet 2  . BRILINTA 90 MG TABS tablet TAKE ONE TABLET BY MOUTH TWICE DAILY 60  tablet 0  . clopidogrel (PLAVIX) 75 MG tablet Take 1 tablet (75 mg total) by mouth daily. 90 tablet 3   No facility-administered medications prior to visit.     Past Medical History  Diagnosis Date  . GERD (gastroesophageal reflux disease)     esophageal stricture, pmh  . HTN (hypertension)   . HLD (hyperlipidemia)   . Amblyopia   . CAD (coronary artery disease)     cardiac CT with a 50% LAD lesion in 2007  . Hypopotassemia   . Esophageal stricture 2003  . Diverticulosis   . Adenomatous colon polyp 08/10/04  . Elevated LFTs     secondary to labetalol  . Hiatal hernia 01/04/00  . External hemorrhoids 2012    lanced by Dr Rise Patience  . Family history of anesthesia complication     "sister had PONV"  . DM2 (diabetes mellitus, type 2) ([place])      Past Surgical History  Procedure Laterality Date  .  Esophagogastroduodenoscopy  2003    ERD, stricture dilated  . Colonoscopy  2006    tics , ADENOMATOUS polyps. F/U due 2011  . Posterior laminectomy / decompression lumbar spine  12/2009    L4 , Dr Sherwood Gambler  . Colonoscopy  2012    Tics  . Back surgery    . Cardiac catheterization  2007; 03/26/2014  . Coronary angioplasty with stent placement  04/01/2014    "2"  . Esophageal dilation  2003  . Excisional hemorrhoidectomy  ~ 2008  . Left heart catheterization with coronary angiogram N/A 03/26/2014    Procedure: LEFT HEART CATHETERIZATION WITH CORONARY ANGIOGRAM;  Surgeon: Peter M Martinique, MD;  Location: [facility] CATH LAB;  Service: Cardiovascular;  Laterality: N/A;  . Percutaneous coronary stent intervention (pci-s) N/A 04/01/2014    Procedure: PERCUTANEOUS CORONARY STENT INTERVENTION (PCI-S);  Surgeon: Peter M Martinique, MD;  Location: [facility] CATH LAB;  Service: Cardiovascular;  Laterality: N/A;     Family History  Problem Relation Age of Onset  . Heart attack Mother 58  . Stroke Mother 64  . Prostate cancer Father   . Diabetes Father   . Heart disease Father 83    CBAG   . Prostate cancer Father   . Diabetes Brother   . Breast cancer Maternal Aunt   . Breast cancer Maternal Aunt     breast  . Heart attack Maternal Grandfather 61  . Diabetes Paternal Grandmother   . Stroke Paternal Grandfather 26     Social History   Social History  . Marital Status: Single    Spouse Name: N/A  . Number of Children: 0  . Years of Education: 16   Occupational History  . sales    Social History Main Topics  . Smoking status: Never Smoker   . Smokeless tobacco: Never Used  . Alcohol Use: 1.2 oz/week    2 Cans of beer per week  . Drug Use: No  . Sexual Activity: Not Currently   Other Topics Concern  . Not on file   Social History Narrative   Fun: Designer, jewellery.     Review of Systems  Constitutional: Denies fever, chills, fatigue, or significant weight gain/loss. HENT: Head: Denies headache  or neck pain Ears: Denies changes in hearing, ringing in ears, earache, drainage Nose: Denies discharge, stuffiness, itching, nosebleed, sinus pain Throat: Denies sore throat, hoarseness, dry mouth, sores, thrush Eyes: Denies loss/changes in vision, pain, redness, blurry/double vision, flashing lights Cardiovascular: Denies chest pain/discomfort, tightness, palpitations, shortness  of breath with activity, difficulty lying down, swelling, sudden awakening with shortness of breath Respiratory: Denies shortness of breath, cough, sputum production, wheezing Gastrointestinal: Denies dysphasia, heartburn, change in appetite, nausea, change in bowel habits, rectal bleeding, constipation, diarrhea, yellow skin or eyes Genitourinary: Denies frequency, urgency, burning/pain, blood in urine, incontinence, change in urinary strength. Musculoskeletal: Denies muscle/joint pain, stiffness, back pain, redness or swelling of joints, trauma Skin: Denies rashes, lumps, itching, dryness, color changes, or hair/nail changes Positive for a wart. Neurological: Denies dizziness, fainting, seizures, weakness, numbness, tingling, tremor Psychiatric - Denies nervousness, stress, depression or memory loss Endocrine: Denies heat or cold intolerance, sweating, frequent urination, excessive thirst, changes in appetite Hematologic: Denies ease of bruising or bleeding     Objective:     BP 124/84 mmHg  Pulse 61  Temp(Src) 97.7 F (36.5 C) (Oral)  Resp 18  Ht 5' 9.5" (1.765 m)  Wt 189 lb (85.73 kg)  BMI 27.52 kg/m2  SpO2 96% Nursing note and vital signs reviewed.  Physical Exam  Constitutional: He is oriented to person, place, and time. He appears well-developed and well-nourished.  HENT:  Head: Normocephalic.  Right Ear: Hearing, tympanic membrane, external ear and ear canal normal.  Left Ear: Hearing, tympanic membrane, external ear and ear canal normal.  Nose: Nose normal.  Mouth/Throat: Uvula is midline,  oropharynx is clear and moist and mucous membranes are normal.  Eyes: Conjunctivae and EOM are normal. Pupils are equal, round, and reactive to light.  Neck: Neck supple. No JVD present. No tracheal deviation present. No thyromegaly present.  Cardiovascular: Normal rate, regular rhythm, normal heart sounds and intact distal pulses.   Pulmonary/Chest: Effort normal and breath sounds normal.  Abdominal: Soft. Bowel sounds are normal. He exhibits no distension and no mass. There is no tenderness. There is no rebound and no guarding.  Musculoskeletal: Normal range of motion. He exhibits no edema or tenderness.  Lymphadenopathy:    He has no cervical adenopathy.  Neurological: He is alert and oriented to person, place, and time. He has normal reflexes. No cranial nerve deficit. He exhibits normal muscle tone. Coordination normal.  Skin: Skin is warm and dry.  2 mm circular, raised and firm lesion with no redness or discoloration consistent with verruca vulgaris.   Psychiatric: He has a normal mood and affect. His behavior is normal. Judgment and thought content normal.       Assessment & Plan:   Problem List Items Addressed This Visit      Endocrine   Diabetes mellitus type 2 in obese ([place])   Relevant Orders   Hemoglobin A1c     Musculoskeletal and Integument   Verruca vulgaris    Symptoms and exam consistent with verruca vulgaris. Patient wishes to have the wart removed via cryosurgery. Risks and benefits were discussed with the patient and verbal consent was obtained. Time out performed. The site was cleansed prior to treatment. Histofreezer was prepared in accordance with manufacturers instructions and applied to the site for 40 seconds. Procedure was tolerated well with no complications. Site was bandaged and after care instructions were provided including symptoms for return.         Other   Encounter for general adult medical examination with abnormal findings - Primary    1)  Anticipatory Guidance: Discussed importance of wearing a seatbelt while driving and not texting while driving; changing batteries in smoke detector at least once annually; wearing suntan lotion when outside; eating a balanced and moderate diet; getting  physical activity at least 30 minutes per day.  2) Immunizations / Screenings / Labs:  Denies influenza and pneumococcal vaccinations. All other immunizations are up-to-date per recommendations. Encouraged to complete diabetic eye exam independently. Diabetic foot exam completed today. Obtain A1c for diabetes screen. Declines hepatitis C and HIV screening. All other screenings are up-to-date per recommendations. Blood work previously done reviewed and within normal limits with no significant abnormalities.  Overall well exam with risk factors for cardiovascular disease including coronary artery disease, type 2 diabetes, hypertension, and hyperlipidemia. Chronic conditions are currently managed with medications and appear to be adequately controlled with no adverse side effects. Recommend continued physical activity with goal of 30 minutes of moderate level activity daily and a nutritional intake that is moderate, varied, and balanced and focuses on nutrient dense foods and is low in saturated fats. Continue other healthy lifestyle choices and behaviors. Follow-up prevention exam in 1 year. Follow-up office visit pending A1c results.       Relevant Orders   PSA

## 2015-08-10 NOTE — Assessment & Plan Note (Signed)
Symptoms and exam consistent with verruca vulgaris. Patient wishes to have the wart removed via cryosurgery. Risks and benefits were discussed with the patient and verbal consent was obtained. Time out performed. The site was cleansed prior to treatment. Histofreezer was prepared in accordance with manufacturers instructions and applied to the site for 40 seconds. Procedure was tolerated well with no complications. Site was bandaged and after care instructions were provided including symptoms for return.

## 2015-08-10 NOTE — Patient Instructions (Addendum)
Thank you for choosing [facility].  Summary/Instructions:  Your prescription(s) have been submitted to your pharmacy or been printed and provided for you. Please take as directed and contact our office if you believe you are having problem(s) with the medication(s) or have any questions.  Please stop by the lab on the basement level of the building for your blood work. Your results will be released to [place] (or called to you) after review, usually within 72 hours after test completion. If any changes need to be made, you will be notified at that same time.  If your symptoms worsen or fail to improve, please contact our office for further instruction, or in case of emergency go directly to the emergency room at the closest medical facility.   Health Maintenance, Male A healthy lifestyle and preventative care can promote health and wellness.  Maintain regular health, dental, and eye exams.  Eat a healthy diet. Foods like vegetables, fruits, whole grains, low-fat dairy products, and lean protein foods contain the nutrients you need and are low in calories. Decrease your intake of foods high in solid fats, added sugars, and salt. Get information about a proper diet from your health care provider, if necessary.  Regular physical exercise is one of the most important things you can do for your health. Most adults should get at least 150 minutes of moderate-intensity exercise (any activity that increases your heart rate and causes you to sweat) each week. In addition, most adults need muscle-strengthening exercises on 2 or more days a week.   Maintain a healthy weight. The body mass index (BMI) is a screening tool to identify possible weight problems. It provides an estimate of body fat based on height and weight. Your health care provider can find your BMI and can help you achieve or maintain a healthy weight. For males 20 years and older:  A BMI below 18.5 is considered underweight.  A  BMI of 18.5 to 24.9 is normal.  A BMI of 25 to 29.9 is considered overweight.  A BMI of 30 and above is considered obese.  Maintain normal blood lipids and cholesterol by exercising and minimizing your intake of saturated fat. Eat a balanced diet with plenty of fruits and vegetables. Blood tests for lipids and cholesterol should begin at age 26 and be repeated every 5 years. If your lipid or cholesterol levels are high, you are over age 2, or you are at high risk for heart disease, you may need your cholesterol levels checked more frequently.Ongoing high lipid and cholesterol levels should be treated with medicines if diet and exercise are not working.  If you smoke, find out from your health care provider how to quit. If you do not use tobacco, do not start.  Lung cancer screening is recommended for adults aged 52-80 years who are at high risk for developing lung cancer because of a history of smoking. A yearly low-dose CT scan of the lungs is recommended for people who have at least a 30-pack-year history of smoking and are current smokers or have quit within the past 15 years. A pack year of smoking is smoking an average of 1 pack of cigarettes a day for 1 year (for example, a 30-pack-year history of smoking could mean smoking 1 pack a day for 30 years or 2 packs a day for 15 years). Yearly screening should continue until the smoker has stopped smoking for at least 15 years. Yearly screening should be stopped for people who develop a  health problem that would prevent them from having lung cancer treatment.  If you choose to drink alcohol, do not have more than 2 drinks per day. One drink is considered to be 12 oz (360 mL) of beer, 5 oz (150 mL) of wine, or 1.5 oz (45 mL) of liquor.  Avoid the use of street drugs. Do not share needles with anyone. Ask for help if you need support or instructions about stopping the use of drugs.  High blood pressure causes heart disease and increases the risk of  stroke. High blood pressure is more likely to develop in:  People who have blood pressure in the end of the normal range (100-139/85-89 mm Hg).  People who are overweight or obese.  People who are African American.  If you are 84-66 years of age, have your blood pressure checked every 3-5 years. If you are 49 years of age or older, have your blood pressure checked every year. You should have your blood pressure measured twice--once when you are at a hospital or clinic, and once when you are not at a hospital or clinic. Record the average of the two measurements. To check your blood pressure when you are not at a hospital or clinic, you can use:  An automated blood pressure machine at a pharmacy.  A home blood pressure monitor.  If you are 9-74 years old, ask your health care provider if you should take aspirin to prevent heart disease.  Diabetes screening involves taking a blood sample to check your fasting blood sugar level. This should be done once every 3 years after age 74 if you are at a normal weight and without risk factors for diabetes. Testing should be considered at a younger age or be carried out more frequently if you are overweight and have at least 1 risk factor for diabetes.  Colorectal cancer can be detected and often prevented. Most routine colorectal cancer screening begins at the age of 77 and continues through age 26. However, your health care provider may recommend screening at an earlier age if you have risk factors for colon cancer. On a yearly basis, your health care provider may provide home test kits to check for hidden blood in the stool. A small camera at the end of a tube may be used to directly examine the colon (sigmoidoscopy or colonoscopy) to detect the earliest forms of colorectal cancer. Talk to your health care provider about this at age 30 when routine screening begins. A direct exam of the colon should be repeated every 5-10 years through age 56, unless early  forms of precancerous polyps or small growths are found.  People who are at an increased risk for hepatitis B should be screened for this virus. You are considered at high risk for hepatitis B if:  You were born in a country where hepatitis B occurs often. Talk with your health care provider about which countries are considered high risk.  Your parents were born in a high-risk country and you have not received a shot to protect against hepatitis B (hepatitis B vaccine).  You have HIV or AIDS.  You use needles to inject street drugs.  You live with, or have sex with, someone who has hepatitis B.  You are a man who has sex with other men (MSM).  You get hemodialysis treatment.  You take certain medicines for conditions like cancer, organ transplantation, and autoimmune conditions.  Hepatitis C blood testing is recommended for all people born from [birth year]  through 1965 and any individual with known risk factors for hepatitis C.  Healthy men should no longer receive prostate-specific antigen (PSA) blood tests as part of routine cancer screening. Talk to your health care provider about prostate cancer screening.  Testicular cancer screening is not recommended for adolescents or adult males who have no symptoms. Screening includes self-exam, a health care provider exam, and other screening tests. Consult with your health care provider about any symptoms you have or any concerns you have about testicular cancer.  Practice safe sex. Use condoms and avoid high-risk sexual practices to reduce the spread of sexually transmitted infections (STIs).  You should be screened for STIs, including gonorrhea and chlamydia if:  You are sexually active and are younger than 24 years.  You are older than 24 years, and your health care provider tells you that you are at risk for this type of infection.  Your sexual activity has changed since you were last screened, and you are at an increased risk for chlamydia  or gonorrhea. Ask your health care provider if you are at risk.  If you are at risk of being infected with HIV, it is recommended that you take a prescription medicine daily to prevent HIV infection. This is called pre-exposure prophylaxis (PrEP). You are considered at risk if:  You are a man who has sex with other men (MSM).  You are a heterosexual man who is sexually active with multiple partners.  You take drugs by injection.  You are sexually active with a partner who has HIV.  Talk with your health care provider about whether you are at high risk of being infected with HIV. If you choose to begin PrEP, you should first be tested for HIV. You should then be tested every 3 months for as long as you are taking PrEP.  Use sunscreen. Apply sunscreen liberally and repeatedly throughout the day. You should seek shade when your shadow is shorter than you. Protect yourself by wearing long sleeves, pants, a wide-brimmed hat, and sunglasses year round whenever you are outdoors.  Tell your health care provider of new moles or changes in moles, especially if there is a change in shape or color. Also, tell your health care provider if a mole is larger than the size of a pencil eraser.  A one-time screening for abdominal aortic aneurysm (AAA) and surgical repair of large AAAs by ultrasound is recommended for men aged 9-75 years who are current or former smokers.  Stay current with your vaccines (immunizations).   This information is not intended to replace advice given to you by your health care provider. Make sure you discuss any questions you have with your health care provider.   Document Released: 12/10/2007 Document Revised: 07/04/2014 Document Reviewed: 11/08/2010 Elsevier Interactive Patient Education 2016 [employer].  Cryosurgery for Skin Conditions, Care After Refer to this sheet in the next few weeks. These instructions provide you with information on caring for yourself after your  procedure. Your health care provider may also give you more specific instructions. Your treatment has been planned according to current medical practices, but problems sometimes occur. Call your health care provider if you have any problems or questions after your procedure. WHAT TO EXPECT AFTER THE PROCEDURE After your procedure, it is typical to have the following:  The treated area will become red and swollen shortly after the procedure.  Within 2-3 days, a blister will form over the treated area. The blister may contain a small amount of  blood.  In about 2 weeks, the blister will break on its own, leaving a scab. The treated area will then heal. After healing, there is usually little or no scarring. HOME CARE INSTRUCTIONS   Keep the treated area clean, dry, and covered with a bandage until healed. The area can be cleaned as usual with soap and water.  You may take showers. If your bandage gets wet, change it right away.  Do not pick at your blister or try to break it open. This can cause infection and scarring.  Do not apply any medicine, cream, or lotion to the treated area unless directed to do so by your health care provider. SEEK MEDICAL CARE IF:   You have increased pain, swelling, redness, fluid drainage, or bleeding in the treated area.  Your blister becomes large and painful.   This information is not intended to replace advice given to you by your health care provider. Make sure you discuss any questions you have with your health care provider.   Document Released: 12/31/2004 Document Revised: 02/13/2013 Document Reviewed: 01/11/2013 Elsevier Interactive Patient Education [employer].

## 2015-08-10 NOTE — Assessment & Plan Note (Signed)
1) Anticipatory Guidance: Discussed importance of wearing a seatbelt while driving and not texting while driving; changing batteries in smoke detector at least once annually; wearing suntan lotion when outside; eating a balanced and moderate diet; getting physical activity at least 30 minutes per day.  2) Immunizations / Screenings / Labs:  Denies influenza and pneumococcal vaccinations. All other immunizations are up-to-date per recommendations. Encouraged to complete diabetic eye exam independently. Diabetic foot exam completed today. Obtain A1c for diabetes screen. Declines hepatitis C and HIV screening. All other screenings are up-to-date per recommendations. Blood work previously done reviewed and within normal limits with no significant abnormalities.  Overall well exam with risk factors for cardiovascular disease including coronary artery disease, type 2 diabetes, hypertension, and hyperlipidemia. Chronic conditions are currently managed with medications and appear to be adequately controlled with no adverse side effects. Recommend continued physical activity with goal of 30 minutes of moderate level activity daily and a nutritional intake that is moderate, varied, and balanced and focuses on nutrient dense foods and is low in saturated fats. Continue other healthy lifestyle choices and behaviors. Follow-up prevention exam in 1 year. Follow-up office visit pending A1c results.

## 2015-08-10 NOTE — Progress Notes (Signed)
Pre visit review using our clinic review tool, if applicable. No additional management support is needed unless otherwise documented below in the visit note. 

## 2015-08-11 LAB — PSA: PSA: 1.5 ng/mL (ref 0.10–4.00)

## 2015-08-12 ENCOUNTER — Encounter: Payer: Self-pay | Admitting: Family

## 2015-10-06 ENCOUNTER — Other Ambulatory Visit: Payer: Self-pay

## 2015-10-06 ENCOUNTER — Other Ambulatory Visit: Payer: Self-pay | Admitting: *Deleted

## 2015-10-06 MED ORDER — IRBESARTAN 300 MG PO TABS: ORAL_TABLET | ORAL | Status: DC

## 2015-10-06 MED ORDER — SPIRONOLACTONE 25 MG PO TABS: ORAL_TABLET | ORAL | Status: DC

## 2015-10-06 MED ORDER — ATORVASTATIN CALCIUM 80 MG PO TABS: 80.0000 mg | ORAL_TABLET | Freq: Every day | ORAL | Status: DC

## 2015-10-06 MED ORDER — CARVEDILOL 3.125 MG PO TABS: ORAL_TABLET | ORAL | Status: DC

## 2016-01-26 ENCOUNTER — Ambulatory Visit (INDEPENDENT_AMBULATORY_CARE_PROVIDER_SITE_OTHER): Payer: 59 | Admitting: Cardiology

## 2016-01-26 ENCOUNTER — Encounter: Payer: Self-pay | Admitting: Cardiology

## 2016-01-26 VITALS — BP 132/82 | HR 60 | Ht 69.5 in | Wt 190.6 lb

## 2016-01-26 DIAGNOSIS — I2510 Atherosclerotic heart disease of native coronary artery without angina pectoris: Secondary | ICD-10-CM

## 2016-01-26 DIAGNOSIS — E119 Type 2 diabetes mellitus without complications: Secondary | ICD-10-CM | POA: Diagnosis not present

## 2016-01-26 DIAGNOSIS — E1169 Type 2 diabetes mellitus with other specified complication: Secondary | ICD-10-CM

## 2016-01-26 DIAGNOSIS — E669 Obesity, unspecified: Secondary | ICD-10-CM

## 2016-01-26 DIAGNOSIS — I10 Essential (primary) hypertension: Secondary | ICD-10-CM

## 2016-01-26 DIAGNOSIS — E785 Hyperlipidemia, unspecified: Secondary | ICD-10-CM

## 2016-01-26 NOTE — Progress Notes (Signed)
[name] Date of Birth: 19-Sep-1950 Medical Record [id number]  History of Present Illness: [name] [name] is seen for cardiac followup CAD. He has a history of coronary disease diagnosed by cardiac CTA in 2007. In September 2015 he presented with chest pain. Myoview study demonstrated anterior wall ischemia. Subsequent cardiac cath demonstrated severe disease in the mid LAD and first diagonal with 50-70% disease in the OM2. On 04/01/14 he had successful stenting of the LAD and diagonal with DES. FFR of the OM was normal. Despite aggressive pretreatment for dye allergy with steroids, pepcid, and benadryl he still developed hives and diarrhea post procedure.  On follow up today he reports he is doing well. No chest pain or SOB. He is walking  and playing golf. Feels great. He is tolerating his medications well.   Current Outpatient Prescriptions on File Prior to Visit  Medication Sig Dispense Refill  . atorvastatin (LIPITOR) 80 MG tablet Take 1 tablet (80 mg total) by mouth daily at 6 PM. NEED OV. 90 tablet 0  . carvedilol (COREG) 3.125 MG tablet Take 1 tablet (3.125 mg) by mouth 2 (two) times daily  with a meal. 180 tablet 2  . CINNAMON PO Take 2 tablets by mouth daily.    . clopidogrel (PLAVIX) 75 MG tablet Take 1 tablet (75 mg total) by mouth daily. 90 tablet 3  . Coenzyme Q10 (CO Q 10 PO) Take 1 tablet by mouth daily.    . Cyanocobalamin (VITAMIN B 12 PO) Take 1 tablet by mouth daily.    . irbesartan (AVAPRO) 300 MG tablet Take 1 tablet by mouth  daily 90 tablet 2  . metFORMIN (GLUCOPHAGE) 500 MG tablet Take 1 tablet (500 mg total) by mouth 2 (two) times daily with a meal. 180 tablet 2  . Multiple Vitamins-Minerals (MULTIVITAMIN WITH MINERALS) tablet Take 1 tablet by mouth daily.    . nitroGLYCERIN (NITROSTAT) 0.4 MG SL tablet Place 1 tablet (0.4 mg total) under the tongue every 5 (five) minutes as needed for chest pain. 25 tablet 3  . Omega-3 Fatty Acids (FISH OIL) 1200 MG CPDR Take 1,200 mg by  mouth 2 (two) times daily.    . ONE TOUCH ULTRA TEST test strip Check blood sugar once daily 100 each 12  . ONETOUCH DELICA LANCETS FINE MISC 1 application by Does not apply route daily. 100 each 12  . spironolactone (ALDACTONE) 25 MG tablet Take 1 tablet by mouth  daily 90 tablet 2   No current facility-administered medications on file prior to visit.     Allergies  Allergen Reactions  . Aspirin     Angioedema  . Norvasc [Amlodipine Besylate]     09/12/2013 gingival hyperplasia , Dr Geralynn Ochs ,DDS  . Contrast Media [Iodinated Diagnostic Agents] Itching and Rash    Past Medical History:  Diagnosis Date  . Adenomatous colon polyp 08/10/04  . Amblyopia   . CAD (coronary artery disease)    cardiac CT with a 50% LAD lesion in 2007  . Diverticulosis   . DM2 (diabetes mellitus, type 2) ([place])   . Elevated LFTs    secondary to labetalol  . Esophageal stricture 2003  . External hemorrhoids 2012   lanced by Dr Rise Patience  . Family history of anesthesia complication    "sister had PONV"  . GERD (gastroesophageal reflux disease)    esophageal stricture, pmh  . Hiatal hernia 01/04/00  . HLD (hyperlipidemia)   . HTN (hypertension)   . Hypopotassemia  Past Surgical History:  Procedure Laterality Date  . BACK SURGERY    . CARDIAC CATHETERIZATION  2007; 03/26/2014  . COLONOSCOPY  2006   tics , ADENOMATOUS polyps. F/U due 2011  . COLONOSCOPY  2012   Tics  . CORONARY ANGIOPLASTY WITH STENT PLACEMENT  04/01/2014   "2"  . ESOPHAGEAL DILATION  2003  . ESOPHAGOGASTRODUODENOSCOPY  2003   ERD, stricture dilated  . EXCISIONAL HEMORRHOIDECTOMY  ~ 2008  . LEFT HEART CATHETERIZATION WITH CORONARY ANGIOGRAM N/A 03/26/2014   Procedure: LEFT HEART CATHETERIZATION WITH CORONARY ANGIOGRAM;  Surgeon: Khiley Lieser M Martinique, MD;  Location: [facility] CATH LAB;  Service: Cardiovascular;  Laterality: N/A;  . PERCUTANEOUS CORONARY STENT INTERVENTION (PCI-S) N/A 04/01/2014   Procedure: PERCUTANEOUS CORONARY STENT  INTERVENTION (PCI-S);  Surgeon: Ermon Sagan M Martinique, MD;  Location: [facility] CATH LAB;  Service: Cardiovascular;  Laterality: N/A;  . POSTERIOR LAMINECTOMY / DECOMPRESSION LUMBAR SPINE  12/2009   L4 , Dr Sherwood Gambler    History  Smoking Status  . Never Smoker  Smokeless Tobacco  . Never Used    History  Alcohol Use  . 1.2 oz/week  . 2 Cans of beer per week    Family History  Problem Relation Age of Onset  . Heart attack Mother 27  . Stroke Mother 64  . Prostate cancer Father   . Diabetes Father   . Heart disease Father 17    CBAG   . Prostate cancer Father   . Diabetes Brother   . Breast cancer Maternal Aunt   . Breast cancer Maternal Aunt     breast  . Heart attack Maternal Grandfather 61  . Diabetes Paternal Grandmother   . Stroke Paternal Grandfather 57    Review of Systems: As noted in HPI. All other systems were reviewed and are negative.  Physical Exam: BP 132/82   Pulse 60   Ht 5' 9.5" (1.765 m)   Wt 190 lb 9.6 oz (86.5 kg)   BMI 27.74 kg/m   He is a pleasant white male in no acute distress. He is overweight. HEENT: Normal. Neck is supple without JVD, adenopathy, thyromegaly, or bruits. Lungs: Clear Cardiovascular: Regular rate and rhythm. Normal S1 and S2. No gallop, murmur, or click. Abdomen: Obese, soft, and nontender. No hepatosplenomegaly. No masses or bruits. Extremities: No cyanosis or edema. Pulses are 2+ and symmetric throughout. No hematoma at radial cath site.  Skin: Warm and dry Neuro: Alert and oriented x3. Cranial nerves II through XII are intact. No focal motor or sensory deficits.  LABORATORY DATA: Lab Results  Component Value Date   WBC 12.1 (H) 04/02/2014   HGB 13.5 04/02/2014   HCT 39.3 04/02/2014   PLT 226 04/02/2014   GLUCOSE 113 (H) 07/09/2015   CHOL 123 (L) 07/09/2015   TRIG 119 07/09/2015   HDL 44 07/09/2015   LDLCALC 55 07/09/2015   ALT 24 07/09/2015   AST 18 07/09/2015   NA 143 07/09/2015   K 4.5 07/09/2015   CL 103 07/09/2015    CREATININE 1.00 07/09/2015   BUN 12 07/09/2015   CO2 26 07/09/2015   TSH 1.09 03/19/2014   PSA 1.50 08/10/2015   INR 1.0 03/19/2014   HGBA1C 6.6 (H) 08/10/2015   MICROALBUR 0.8 03/19/2014    Assessment / Plan: 1. Coronary disease s/p DES stenting of the LAD and first diagonal in October 2015. Normal FFR of OM2. Will continue Plavix long term.  Patient is allergic to ASA. Continue risk factor modification.  2. Hyperlipidemia. Excellent control on lipitor.   3. Diabetes mellitus type 2. Under improved control. Continue dietary modification.  4. Hypertension-controlled.  5. Contrast dye allergy with hives- despite pretreatment.  I will follow up in 6 months.

## 2016-01-26 NOTE — Patient Instructions (Signed)
Continue your current therapy  I will see you in 6 months.   

## 2016-02-15 ENCOUNTER — Encounter: Payer: Self-pay | Admitting: Gastroenterology

## 2016-02-16 IMAGING — CR DG CHEST 2V
2 series · 2 of 2 positions shown · non-contrast
Comparison: Radiographs 01/15/2010.

CLINICAL DATA: Pre cardiac catheterization.

EXAM:
CHEST  2 VIEW

[view not recorded (1 of 2)]
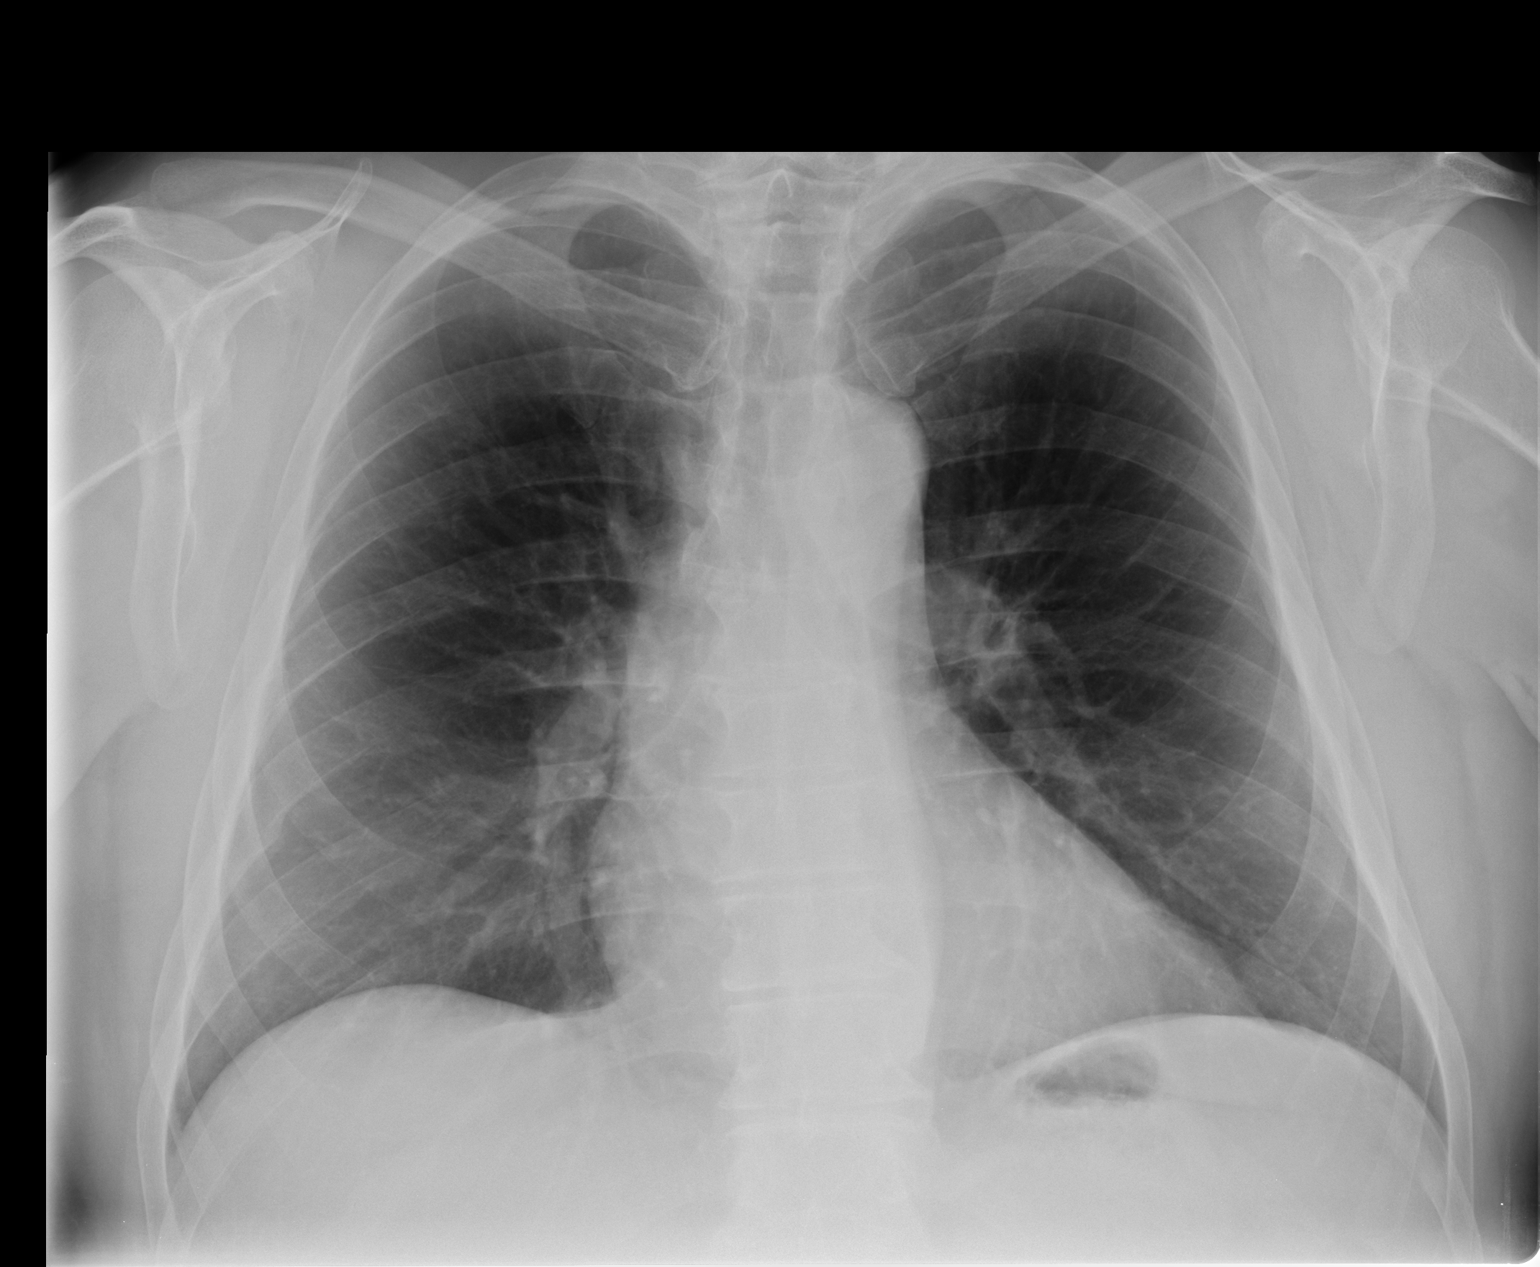

[view not recorded (2 of 2)]
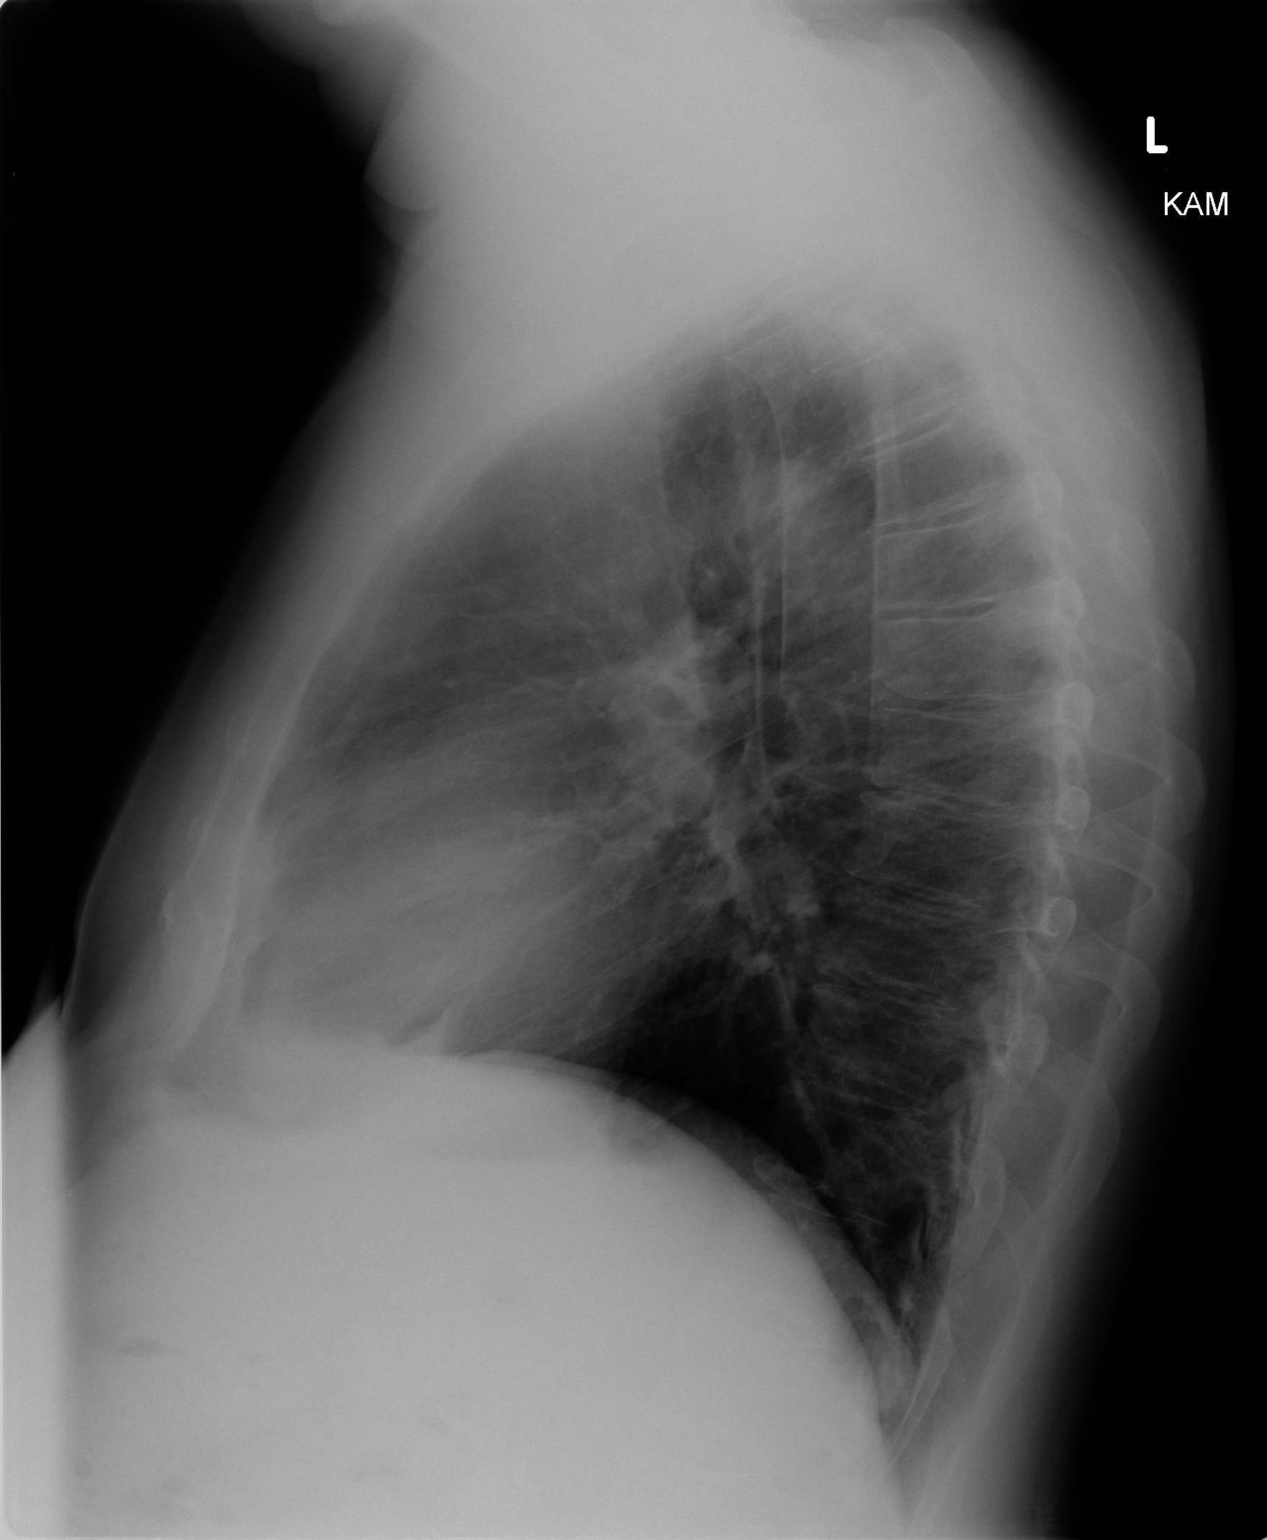

[2 of 2 positions shown; findings below may reference images not displayed]

FINDINGS: The heart size and mediastinal contours are normal. The lungs are
clear. There is no pleural effusion or pneumothorax. No acute
osseous findings are identified. An old right-sided rib fracture and
mild degenerative changes throughout the thoracic spine are stable.
IMPRESSION: Stable chest.  No active cardiopulmonary process.

## 2016-03-07 ENCOUNTER — Other Ambulatory Visit: Payer: Self-pay | Admitting: Cardiology

## 2016-04-13 ENCOUNTER — Other Ambulatory Visit: Payer: Self-pay | Admitting: Cardiology

## 2016-04-14 NOTE — Telephone Encounter (Signed)
REFILL 

## 2016-05-09 ENCOUNTER — Telehealth: Payer: Self-pay | Admitting: Family

## 2016-05-09 NOTE — Telephone Encounter (Signed)
[facility]  Patient Name: [name] [name]  DOB: 1951-03-21    Initial Comment Caller says, nose has been bleeding since 8 am , blood pressure is little high -- wants an appt    Nurse Assessment  Nurse: Wayne Sever, RN, Tillie Rung Date/Time (Eastern Time): 05/09/2016 12:30:47 PM  Confirm and document reason for call. If symptomatic, describe symptoms. You must click the next button to save text entered. ---Caller states his nose has been bleeding since 8am. He states it's running down his throat and is spitting out some blood.  Has the patient traveled out of the country within the last 30 days? ---Not Applicable  Does the patient have any new or worsening symptoms? ---Yes  Will a triage be completed? ---Yes  Related visit to physician within the last 2 weeks? ---No  Does the PT have any chronic conditions? (i.e. diabetes, asthma, etc.) ---Yes  List chronic conditions. ---Heart Stents, HTN, On blood Thinner  Is this a behavioral health or substance abuse call? ---No     Guidelines    Guideline Title Affirmed Question Affirmed Notes  Nosebleed Taking Coumadin (warfarin) or other strong blood thinner, or known bleeding disorder (e.g., thrombocytopenia)    Final Disposition User   See Physician within 24 Hours [place], RN, Tillie Rung    Comments  Scheduled at 930am tomorrow with Dr Elna Breslow   Referrals  REFERRED TO PCP OFFICE   Disagree/Comply: Comply

## 2016-05-10 ENCOUNTER — Ambulatory Visit (INDEPENDENT_AMBULATORY_CARE_PROVIDER_SITE_OTHER): Payer: 59 | Admitting: Family

## 2016-05-10 ENCOUNTER — Encounter: Payer: Self-pay | Admitting: Family

## 2016-05-10 DIAGNOSIS — R040 Epistaxis: Secondary | ICD-10-CM

## 2016-05-10 NOTE — Patient Instructions (Addendum)
Thank you for choosing [facility].  SUMMARY AND INSTRUCTIONS:  Recommend Simply Saline or other saline spray   May also try Affrin.   Follow up:  If your symptoms worsen or fail to improve, please contact our office for further instruction, or in case of emergency go directly to the emergency room at the closest medical facility.     Nosebleed, Adult A nosebleed is when blood comes out of the nose. Nosebleeds are common. Usually, they are not a sign of a serious condition. Nosebleeds can happen if a small blood vessel in your nose starts to bleed or if the lining of your nose (mucous membrane) cracks. They are commonly caused by:  Allergies.  Colds.  Picking your nose.  Blowing your nose too hard.  An injury from sticking an object into your nose or getting hit in the nose.  Dry or cold air. Less common causes of nosebleeds include:  Toxic fumes.  Something abnormal in the nose or in the air-filled spaces in the bones of the face (sinuses).  Growths in the nose, such as polyps.  Medicines or conditions that cause blood to clot slowly.  Certain illnesses or procedures that irritate or dry out the nasal passages. Follow these instructions at home: When you have a nosebleed:  Sit down and tilt your head slightly forward.  Use a clean towel or tissue to pinch your nostrils under the bony part of your nose. After 10 minutes, let go of your nose and see if bleeding starts again. Do not release pressure before that time. If there is still bleeding, repeat the pinching and holding for 10 minutes until the bleeding stops.  Do not place tissues or gauze in the nose to stop bleeding.  Avoid lying down and avoid tilting your head backward. That may make blood collect in the throat and cause gagging or coughing.  Use a nasal spray decongestant to help with a nosebleed as told by your health care provider.  Do not use petroleum jelly or mineral oil in your nose. It can  drip into your lungs. After a nosebleed:  Avoid blowing your nose or sniffing for a number of hours.  Avoid straining, lifting, or bending at the waist for several days. You may resume other normal activities as you are able.  Use saline spray or a humidifier as told by your health care provider.  Aspirinand blood thinners make bleeding more likely. If you are prescribed these medicines and you suffer from nosebleeds:  Ask your health care provider if you should stop taking the medicines or if you should adjust the dose.  Do not stop taking medicines that your health care provider has recommended unless told by your health care provider.  If your nosebleed was caused by dry mucous membranes, use over-the-counter saline nasal spray or gel. This will keep the mucous membranes moist and allow them to heal. If you must use a lubricant:  Choose one that is water-soluble.  Use only as much as you need and use it only as often as needed.  Do not lie down until several hours after you use it. Contact a health care provider if:  You have a fever.  You get nosebleeds often or more often than usual.  You bruise very easily.  You have a nosebleed from having something stuck in your nose.  You have bleeding in your mouth.  You vomit or cough up brown material.  You have a nosebleed after you start a  new medicine. Get help right away if:  You have a nosebleed after a fall or a head injury.  Your nosebleed does not go away after 20 minutes.  You feel dizzy or weak.  You have unusual bleeding from other parts of your body.  You have unusual bruising on other parts of your body.  You become sweaty.  You vomit blood. This information is not intended to replace advice given to you by your health care provider. Make sure you discuss any questions you have with your health care provider. Document Released: 03/23/2005 Document Revised: 02/11/2016 Document Reviewed:  12/29/2015 Elsevier Interactive Patient Education  2017 [employer].

## 2016-05-10 NOTE — Assessment & Plan Note (Addendum)
Nasal exam normal with no ischemic and findings. Recommend saline nasal sprays as needed for moisture. May use Afrin to help control bleeding as needed. Educated and discussed importance of nosebleed care in the event of future nuisance bleeding occurs. Continue current dosage of Plavix. Follow-up if symptoms return or worsen.

## 2016-05-10 NOTE — Progress Notes (Signed)
Subjective:    Patient ID: [name] [name], male    DOB: Nov 29, 1950, 65 y.o.   MRN: [id number]  Chief Complaint  Patient presents with  . nose bleeds    had a nose bleed yesterday that he could not stop, was told to come in to get his blood checked since he is on blood thinner    HPI:  [name] [name] is a 65 y.o. male who  has a past medical history of Adenomatous colon polyp (09/07/2004); Amblyopia; CAD (coronary artery disease); Diverticulosis; DM2 (diabetes mellitus, type 2) ([place]); Elevated LFTs; Esophageal stricture (2003); External hemorrhoids (2012); Family history of anesthesia complication; GERD (gastroesophageal reflux disease); Hiatal hernia (01/04/00); HLD (hyperlipidemia); HTN (hypertension); and Hypopotassemia. and presents today for an office visit.  This is a new problem. Associated symptom of bleeding from his nose that occurred yesterday and was described as difficult to stop. After speaking with the Triage Nurse line he was able to get the blood stopped. Please it may be related to dryness. No trauma or injury. Has not had any bleeding since. No weakness or dizziness. Continues to take the Plavix as prescribed with no other nuisance bleeding.   Allergies  Allergen Reactions  . Aspirin     Angioedema  . Norvasc [Amlodipine Besylate]     09/12/2013 gingival hyperplasia , Dr Geralynn Ochs ,DDS  . Contrast Media [Iodinated Diagnostic Agents] Itching and Rash      Outpatient Medications Prior to Visit  Medication Sig Dispense Refill  . atorvastatin (LIPITOR) 80 MG tablet Take 1 tablet by mouth  daily at 6pm 90 tablet 3  . carvedilol (COREG) 3.125 MG tablet Take 1 tablet (3.125 mg) by mouth 2 (two) times daily  with a meal. 180 tablet 2  . CINNAMON PO Take 2 tablets by mouth daily.    . clopidogrel (PLAVIX) 75 MG tablet TAKE 1 TABLET BY MOUTH  DAILY 90 tablet 1  . Coenzyme Q10 (CO Q 10 PO) Take 1 tablet by mouth daily.    . Cyanocobalamin (VITAMIN B 12 PO) Take 1 tablet by mouth  daily.    . irbesartan (AVAPRO) 300 MG tablet Take 1 tablet by mouth  daily 90 tablet 2  . metFORMIN (GLUCOPHAGE) 500 MG tablet Take 1 tablet by mouth  twice a day with a meal 180 tablet 3  . Multiple Vitamins-Minerals (MULTIVITAMIN WITH MINERALS) tablet Take 1 tablet by mouth daily.    . nitroGLYCERIN (NITROSTAT) 0.4 MG SL tablet Place 1 tablet (0.4 mg total) under the tongue every 5 (five) minutes as needed for chest pain. 25 tablet 3  . Omega-3 Fatty Acids (FISH OIL) 1200 MG CPDR Take 1,200 mg by mouth 2 (two) times daily.    . ONE TOUCH ULTRA TEST test strip Check blood sugar once daily 100 each 12  . ONETOUCH DELICA LANCETS FINE MISC 1 application by Does not apply route daily. 100 each 12  . spironolactone (ALDACTONE) 25 MG tablet Take 1 tablet by mouth  daily 90 tablet 2   No facility-administered medications prior to visit.     Review of Systems  Constitutional: Negative for chills and fever.  HENT: Positive for nosebleeds. Negative for congestion.   Neurological: Negative for dizziness, weakness and light-headedness.      Objective:    BP 132/84 (BP Location: Left Arm, Patient Position: Sitting, Cuff Size: Normal)   Pulse (!) 55   Temp 97.9 F (36.6 C) (Oral)   Resp 16   Ht 5'  9.5" (1.765 m)   Wt 191 lb (86.6 kg)   SpO2 98%   BMI 27.80 kg/m  Nursing note and vital signs reviewed.  Physical Exam  Constitutional: He is oriented to person, place, and time. He appears well-developed and well-nourished. No distress.  HENT:  Nose: No mucosal edema, rhinorrhea, nose lacerations, sinus tenderness, nasal deformity, septal deviation or nasal septal hematoma. No epistaxis.  No foreign bodies.  Cardiovascular: Normal rate, regular rhythm, normal heart sounds and intact distal pulses.   Pulmonary/Chest: Effort normal and breath sounds normal.  Neurological: He is alert and oriented to person, place, and time.  Skin: Skin is warm and dry.  Psychiatric: He has a normal mood and  affect. His behavior is normal. Judgment and thought content normal.       Assessment & Plan:   Problem List Items Addressed This Visit      Respiratory   Epistaxis    Nasal exam normal with no ischemic and findings. Recommend saline nasal sprays as needed for moisture. May use Afrin to help control bleeding as needed. Educated and discussed importance of nosebleed care in the event of future nuisance bleeding occurs. Continue current dosage of Plavix. Follow-up if symptoms return or worsen.          I am having Mr. Steinberger maintain his ONE TOUCH ULTRA TEST, ONETOUCH DELICA LANCETS FINE, multivitamin with minerals, CINNAMON PO, Cyanocobalamin (VITAMIN B 12 PO), Coenzyme Q10 (CO Q 10 PO), nitroGLYCERIN, Fish Oil, spironolactone, carvedilol, irbesartan, metFORMIN, atorvastatin, and clopidogrel.   Follow-up: Return if symptoms worsen or fail to improve.  Mauricio Po, FNP

## 2016-07-29 ENCOUNTER — Ambulatory Visit (INDEPENDENT_AMBULATORY_CARE_PROVIDER_SITE_OTHER): Payer: 59 | Admitting: Family Medicine

## 2016-07-29 ENCOUNTER — Encounter: Payer: Self-pay | Admitting: Family Medicine

## 2016-07-29 ENCOUNTER — Telehealth: Payer: Self-pay | Admitting: Family

## 2016-07-29 VITALS — BP 124/60 | HR 85 | Temp 101.7°F | Ht 69.5 in | Wt 188.0 lb

## 2016-07-29 DIAGNOSIS — J111 Influenza due to unidentified influenza virus with other respiratory manifestations: Secondary | ICD-10-CM | POA: Diagnosis not present

## 2016-07-29 MED ORDER — OSELTAMIVIR PHOSPHATE 75 MG PO CAPS: 75.0000 mg | ORAL_CAPSULE | Freq: Two times a day (BID) | ORAL | 0 refills | Status: DC

## 2016-07-29 MED ORDER — BENZONATATE 100 MG PO CAPS: 100.0000 mg | ORAL_CAPSULE | Freq: Two times a day (BID) | ORAL | 0 refills | Status: DC | PRN

## 2016-07-29 NOTE — Progress Notes (Signed)
HPI:  Acute visit for flu like symptoms -started: yesterday -symptoms:nasal congestion, cough, fatigue, nausea, fever -denies:fever, SOB, NVD, tooth pain -has tried:  -sick contacts/travel/risks: father just passed away from flu and they were around him a lot -Hx of: DM ROS: See pertinent positives and negatives per HPI.  Past Medical History:  Diagnosis Date  . Adenomatous colon polyp 09/07/2004  . Amblyopia   . CAD (coronary artery disease)    cardiac CT with a 50% LAD lesion in 2007  . Diverticulosis   . DM2 (diabetes mellitus, type 2) ([place])   . Elevated LFTs    secondary to labetalol  . Esophageal stricture 2003  . External hemorrhoids 2012   lanced by Dr Rise Patience  . Family history of anesthesia complication    "sister had PONV"  . GERD (gastroesophageal reflux disease)    esophageal stricture, pmh  . Hiatal hernia 01/04/00  . HLD (hyperlipidemia)   . HTN (hypertension)   . Hypopotassemia     Past Surgical History:  Procedure Laterality Date  . BACK SURGERY    . CARDIAC CATHETERIZATION  2007; 03/26/2014  . COLONOSCOPY  2006   tics , ADENOMATOUS polyps. F/U due 2011  . COLONOSCOPY  2012   Tics  . CORONARY ANGIOPLASTY WITH STENT PLACEMENT  04/01/2014   "2"  . ESOPHAGEAL DILATION  2003  . ESOPHAGOGASTRODUODENOSCOPY  2003   ERD, stricture dilated  . EXCISIONAL HEMORRHOIDECTOMY  ~ 2008  . LEFT HEART CATHETERIZATION WITH CORONARY ANGIOGRAM N/A 03/26/2014   Procedure: LEFT HEART CATHETERIZATION WITH CORONARY ANGIOGRAM;  Surgeon: Peter M Martinique, MD;  Location: [facility] CATH LAB;  Service: Cardiovascular;  Laterality: N/A;  . PERCUTANEOUS CORONARY STENT INTERVENTION (PCI-S) N/A 04/01/2014   Procedure: PERCUTANEOUS CORONARY STENT INTERVENTION (PCI-S);  Surgeon: Peter M Martinique, MD;  Location: [facility] CATH LAB;  Service: Cardiovascular;  Laterality: N/A;  . POSTERIOR LAMINECTOMY / DECOMPRESSION LUMBAR SPINE  12/2009   L4 , Dr Sherwood Gambler    Family History  Problem Relation Age of  Onset  . Heart attack Mother 56  . Stroke Mother 22  . Prostate cancer Father   . Diabetes Father   . Heart disease Father 85    CBAG   . Diabetes Brother   . Breast cancer Maternal Aunt   . Heart attack Maternal Grandfather 61  . Diabetes Paternal Grandmother   . Stroke Paternal Grandfather 54    Social History   Social History  . Marital status: Single    Spouse name: N/A  . Number of children: 0  . Years of education: 33   Occupational History  . sales Tree surgeon   Social History Main Topics  . Smoking status: Never Smoker  . Smokeless tobacco: Never Used  . Alcohol use 1.2 oz/week    2 Cans of beer per week  . Drug use: No  . Sexual activity: Not Currently   Other Topics Concern  . None   Social History Narrative   Fun: Designer, jewellery.      Current Outpatient Prescriptions:  .  atorvastatin (LIPITOR) 80 MG tablet, Take 1 tablet by mouth  daily at 6pm, Disp: 90 tablet, Rfl: 3 .  carvedilol (COREG) 3.125 MG tablet, Take 1 tablet (3.125 mg) by mouth 2 (two) times daily  with a meal., Disp: 180 tablet, Rfl: 2 .  CINNAMON PO, Take 2 tablets by mouth daily., Disp: , Rfl:  .  clopidogrel (PLAVIX) 75 MG tablet, TAKE 1 TABLET BY MOUTH  DAILY, Disp: 90 tablet, Rfl: 1 .  Coenzyme Q10 (CO Q 10 PO), Take 1 tablet by mouth daily., Disp: , Rfl:  .  Cyanocobalamin (VITAMIN B 12 PO), Take 1 tablet by mouth daily., Disp: , Rfl:  .  irbesartan (AVAPRO) 300 MG tablet, Take 1 tablet by mouth  daily, Disp: 90 tablet, Rfl: 2 .  metFORMIN (GLUCOPHAGE) 500 MG tablet, Take 1 tablet by mouth  twice a day with a meal, Disp: 180 tablet, Rfl: 3 .  Multiple Vitamins-Minerals (MULTIVITAMIN WITH MINERALS) tablet, Take 1 tablet by mouth daily., Disp: , Rfl:  .  nitroGLYCERIN (NITROSTAT) 0.4 MG SL tablet, Place 1 tablet (0.4 mg total) under the tongue every 5 (five) minutes as needed for chest pain., Disp: 25 tablet, Rfl: 3 .  Omega-3 Fatty Acids (FISH OIL) 1200 MG CPDR, Take 1,200 mg by  mouth 2 (two) times daily., Disp: , Rfl:  .  ONE TOUCH ULTRA TEST test strip, Check blood sugar once daily, Disp: 100 each, Rfl: 12 .  ONETOUCH DELICA LANCETS FINE MISC, 1 application by Does not apply route daily., Disp: 100 each, Rfl: 12 .  spironolactone (ALDACTONE) 25 MG tablet, Take 1 tablet by mouth  daily, Disp: 90 tablet, Rfl: 2 .  benzonatate (TESSALON) 100 MG capsule, Take 1 capsule (100 mg total) by mouth 2 (two) times daily as needed for cough., Disp: 20 capsule, Rfl: 0 .  oseltamivir (TAMIFLU) 75 MG capsule, Take 1 capsule (75 mg total) by mouth 2 (two) times daily., Disp: 10 capsule, Rfl: 0  EXAM:  Vitals:   07/29/16 1352  BP: 124/60  Pulse: 85  Temp: (!) 101.7 F (38.7 C)    Body mass index is 27.36 kg/m.  GENERAL: vitals reviewed and listed above, alert, oriented, appears well hydrated and in no acute distress  HEENT: atraumatic, conjunttiva clear, no obvious abnormalities on inspection of external nose and ears, normal appearance of ear canals and TMs, clear nasal congestion, mild post oropharyngeal erythema with PND, no tonsillar edema or exudate, no sinus TTP  NECK: no obvious masses on inspection  LUNGS: clear to auscultation bilaterally, no wheezes, rales or rhonchi, good air movement  CV: HRRR, no peripheral edema  MS: moves all extremities without noticeable abnormality  PSYCH: pleasant and cooperative, no obvious depression or anxiety  ASSESSMENT AND PLAN:  Discussed the following assessment and plan:  Influenza We discussed potential etiologies, with influenza being most likely. He seems to be coping well despite the tragic passing of his father - he was 67 yo. We discussed testing, treatment side effects, likely course,  tamiflu risks/benefits, transmission, potential complications, signs of developing a serious illness, return and emergency precautions. He is a candidate for antiviral therapy and rx provided along with tessalon for cough. -of course,  we advised to return or notify a doctor immediately if symptoms worsen or persist or new concerns arise.    Patient Instructions  Start the tamiflu right away.  Tessalon as needed for cough.   Influenza, Adult Influenza, more commonly known as "the flu," is a viral infection that primarily affects the respiratory tract. The respiratory tract includes organs that help you breathe, such as the lungs, nose, and throat. The flu causes many common cold symptoms, as well as a high fever and body aches. The flu spreads easily from person to person (is contagious). Getting a flu shot (influenza vaccination) every year is the best way to prevent influenza. What are the causes? Influenza is caused by  a virus. You can catch the virus by:  Breathing in droplets from an infected person's cough or sneeze.  Touching something that was recently contaminated with the virus and then touching your mouth, nose, or eyes. What increases the risk? The following factors may make you more likely to get the flu:  Not cleaning your hands frequently with soap and water or alcohol-based hand sanitizer.  Having close contact with many people during cold and flu season.  Touching your mouth, eyes, or nose without washing or sanitizing your hands first.  Not drinking enough fluids or not eating a healthy diet.  Not getting enough sleep or exercise.  Being under a high amount of stress.  Not getting a yearly (annual) flu shot. You may be at a higher risk of complications from the flu, such as a severe lung infection (pneumonia), if you:  Are over the age of 55.  Are pregnant.  Have a weakened disease-fighting system (immune system). You may have a weakened immune system if you:  Have HIV or AIDS.  Are undergoing chemotherapy.  Aretaking medicines that reduce the activity of (suppress) the immune system.  Have a long-term (chronic) illness, such as heart disease, kidney disease, diabetes, or lung  disease.  Have a liver disorder.  Are obese.  Have anemia. What are the signs or symptoms? Symptoms of this condition typically last 4-10 days and may include:  Fever.  Chills.  Headache, body aches, or muscle aches.  Sore throat.  Cough.  Runny or congested nose.  Chest discomfort and cough.  Poor appetite.  Weakness or tiredness (fatigue).  Dizziness.  Nausea or vomiting. How is this diagnosed? This condition may be diagnosed based on your medical history and a physical exam. Your health care provider may do a nose or throat swab test to confirm the diagnosis. How is this treated? If influenza is detected early, you can be treated with antiviral medicine that can reduce the length of your illness and the severity of your symptoms. This medicine may be given by mouth (orally) or through an IV tube that is inserted in one of your veins. The goal of treatment is to relieve symptoms by taking care of yourself at home. This may include taking over-the-counter medicines, drinking plenty of fluids, and adding humidity to the air in your home. In some cases, influenza goes away on its own. Severe influenza or complications from influenza may be treated in a hospital. Follow these instructions at home:  Take over-the-counter and prescription medicines only as told by your health care provider.  Use a cool mist humidifier to add humidity to the air in your home. This can make breathing easier.  Rest as needed.  Drink enough fluid to keep your urine clear or pale yellow.  Cover your mouth and nose when you cough or sneeze.  Wash your hands with soap and water often, especially after you cough or sneeze. If soap and water are not available, use hand sanitizer.  Stay home from work or school as told by your health care provider. Unless you are visiting your health care provider, try to avoid leaving home until your fever has been gone for 24 hours without the use of  medicine.  Keep all follow-up visits as told by your health care provider. This is important. How is this prevented?  Getting an annual flu shot is the best way to avoid getting the flu. You may get the flu shot in late summer, fall, or winter.  Ask your health care provider when you should get your flu shot.  Wash your hands often or use hand sanitizer often.  Avoid contact with people who are sick during cold and flu season.  Eat a healthy diet, drink plenty of fluids, get enough sleep, and exercise regularly. Contact a health care provider if:  You develop new symptoms.  You have:  Chest pain.  Diarrhea.  A fever.  Your cough gets worse.  You produce more mucus.  You feel nauseous or you vomit. Get help right away if:  You develop shortness of breath or difficulty breathing.  Your skin or nails turn a bluish color.  You have severe pain or stiffness in your neck.  You develop a sudden headache or sudden pain in your face or ear.  You cannot stop vomiting. This information is not intended to replace advice given to you by your health care provider. Make sure you discuss any questions you have with your health care provider. Document Released: 06/10/2000 Document Revised: 11/19/2015 Document Reviewed: 04/07/2015 Elsevier Interactive Patient Education  2017 [place]., DO

## 2016-07-29 NOTE — Progress Notes (Signed)
Pre visit review using our clinic review tool, if applicable. No additional management support is needed unless otherwise documented below in the visit note. 

## 2016-07-29 NOTE — Telephone Encounter (Signed)
Patient Name: [name] [name] DOB: 01-15-1951 Initial Comment cough, fever, headache, sinus pressure, weak Nurse Assessment Nurse: Lavera Guise RN, Vaughan Basta Date/Time (Eastern Time): 07/29/2016 12:12:15 PM Confirm and document reason for call. If symptomatic, describe symptoms. ---Caller states he has a cough, fever, gets hot and goes away, no thermometer; headache, 6/10. Sinus pressure, 7/8 out of 10. weakness. Started yesterday, coughing. Woke up feeling really bad this morning. Does the patient have any new or worsening symptoms? ---Yes Will a triage be completed? ---Yes Related visit to physician within the last 2 weeks? ---No Does the PT have any chronic conditions? (i.e. diabetes, asthma, etc.) ---Yes List chronic conditions. ---hypertension diabetes 2 cardiac stents Is this a behavioral health or substance abuse call? ---No Guidelines Guideline Title Affirmed Question Affirmed Notes Influenza - Seasonal Patient is HIGH RISK (e.g., age > 65 years, pregnant, HIV+, or chronic medical condition) Final Disposition User Call PCP Now Lavera Guise, RN, Vaughan Basta Comments Reached pt and appt scheduled for today at 2pm with Dr. Maudie Mercury at [facility]. Referrals REFERRED TO PCP OFFICE Disagree/Comply: Comply

## 2016-07-29 NOTE — Patient Instructions (Signed)
Start the tamiflu right away.  Tessalon as needed for cough.   Influenza, Adult Influenza, more commonly known as "the flu," is a viral infection that primarily affects the respiratory tract. The respiratory tract includes organs that help you breathe, such as the lungs, nose, and throat. The flu causes many common cold symptoms, as well as a high fever and body aches. The flu spreads easily from person to person (is contagious). Getting a flu shot (influenza vaccination) every year is the best way to prevent influenza. What are the causes? Influenza is caused by a virus. You can catch the virus by:  Breathing in droplets from an infected person's cough or sneeze.  Touching something that was recently contaminated with the virus and then touching your mouth, nose, or eyes. What increases the risk? The following factors may make you more likely to get the flu:  Not cleaning your hands frequently with soap and water or alcohol-based hand sanitizer.  Having close contact with many people during cold and flu season.  Touching your mouth, eyes, or nose without washing or sanitizing your hands first.  Not drinking enough fluids or not eating a healthy diet.  Not getting enough sleep or exercise.  Being under a high amount of stress.  Not getting a yearly (annual) flu shot. You may be at a higher risk of complications from the flu, such as a severe lung infection (pneumonia), if you:  Are over the age of 86.  Are pregnant.  Have a weakened disease-fighting system (immune system). You may have a weakened immune system if you:  Have HIV or AIDS.  Are undergoing chemotherapy.  Aretaking medicines that reduce the activity of (suppress) the immune system.  Have a long-term (chronic) illness, such as heart disease, kidney disease, diabetes, or lung disease.  Have a liver disorder.  Are obese.  Have anemia. What are the signs or symptoms? Symptoms of this condition typically  last 4-10 days and may include:  Fever.  Chills.  Headache, body aches, or muscle aches.  Sore throat.  Cough.  Runny or congested nose.  Chest discomfort and cough.  Poor appetite.  Weakness or tiredness (fatigue).  Dizziness.  Nausea or vomiting. How is this diagnosed? This condition may be diagnosed based on your medical history and a physical exam. Your health care provider may do a nose or throat swab test to confirm the diagnosis. How is this treated? If influenza is detected early, you can be treated with antiviral medicine that can reduce the length of your illness and the severity of your symptoms. This medicine may be given by mouth (orally) or through an IV tube that is inserted in one of your veins. The goal of treatment is to relieve symptoms by taking care of yourself at home. This may include taking over-the-counter medicines, drinking plenty of fluids, and adding humidity to the air in your home. In some cases, influenza goes away on its own. Severe influenza or complications from influenza may be treated in a hospital. Follow these instructions at home:  Take over-the-counter and prescription medicines only as told by your health care provider.  Use a cool mist humidifier to add humidity to the air in your home. This can make breathing easier.  Rest as needed.  Drink enough fluid to keep your urine clear or pale yellow.  Cover your mouth and nose when you cough or sneeze.  Wash your hands with soap and water often, especially after you cough or sneeze. If  soap and water are not available, use hand sanitizer.  Stay home from work or school as told by your health care provider. Unless you are visiting your health care provider, try to avoid leaving home until your fever has been gone for 24 hours without the use of medicine.  Keep all follow-up visits as told by your health care provider. This is important. How is this prevented?  Getting an annual flu  shot is the best way to avoid getting the flu. You may get the flu shot in late summer, fall, or winter. Ask your health care provider when you should get your flu shot.  Wash your hands often or use hand sanitizer often.  Avoid contact with people who are sick during cold and flu season.  Eat a healthy diet, drink plenty of fluids, get enough sleep, and exercise regularly. Contact a health care provider if:  You develop new symptoms.  You have:  Chest pain.  Diarrhea.  A fever.  Your cough gets worse.  You produce more mucus.  You feel nauseous or you vomit. Get help right away if:  You develop shortness of breath or difficulty breathing.  Your skin or nails turn a bluish color.  You have severe pain or stiffness in your neck.  You develop a sudden headache or sudden pain in your face or ear.  You cannot stop vomiting. This information is not intended to replace advice given to you by your health care provider. Make sure you discuss any questions you have with your health care provider. Document Released: 06/10/2000 Document Revised: 11/19/2015 Document Reviewed: 04/07/2015 Elsevier Interactive Patient Education  2017 [employer].

## 2016-08-14 NOTE — Progress Notes (Signed)
[name] Date of Birth: Aug 19, 1950 Medical Record [id number]  History of Present Illness: Rueben is seen for cardiac followup CAD. He has a history of coronary disease diagnosed by cardiac CTA in 2007. In September 2015 he presented with chest pain. Myoview study demonstrated anterior wall ischemia. Subsequent cardiac cath demonstrated severe disease in the mid LAD and first diagonal with 50-70% disease in the OM2. On 04/01/14 he had successful stenting of the LAD and diagonal with DES. FFR of the OM was normal. Despite aggressive pretreatment for dye allergy with steroids, pepcid, and benadryl he still developed hives and diarrhea post procedure.  On follow up today he reports he is doing well. No chest pain or SOB. He hasn't been as active this last month due to having the flu  Current Outpatient Prescriptions on File Prior to Visit  Medication Sig Dispense Refill  . atorvastatin (LIPITOR) 80 MG tablet Take 1 tablet by mouth  daily at 6pm 90 tablet 3  . CINNAMON PO Take 2 tablets by mouth daily.    . Coenzyme Q10 (CO Q 10 PO) Take 1 tablet by mouth daily.    . Cyanocobalamin (VITAMIN B 12 PO) Take 1 tablet by mouth daily.    . metFORMIN (GLUCOPHAGE) 500 MG tablet Take 1 tablet by mouth  twice a day with a meal 180 tablet 3  . Multiple Vitamins-Minerals (MULTIVITAMIN WITH MINERALS) tablet Take 1 tablet by mouth daily.    . nitroGLYCERIN (NITROSTAT) 0.4 MG SL tablet Place 1 tablet (0.4 mg total) under the tongue every 5 (five) minutes as needed for chest pain. 25 tablet 3  . Omega-3 Fatty Acids (FISH OIL) 1200 MG CPDR Take 1,200 mg by mouth 2 (two) times daily.    . ONE TOUCH ULTRA TEST test strip Check blood sugar once daily 100 each 12  . ONETOUCH DELICA LANCETS FINE MISC 1 application by Does not apply route daily. 100 each 12   No current facility-administered medications on file prior to visit.     Allergies  Allergen Reactions  . Aspirin Other (See Comments)    Angioedema  .  Contrast Media [Iodinated Diagnostic Agents] Itching and Rash  . Norvasc [Amlodipine Besylate] Other (See Comments)    09/12/2013 gingival hyperplasia , Dr Geralynn Ochs ,DDS    Past Medical History:  Diagnosis Date  . Adenomatous colon polyp 09/07/2004  . Amblyopia   . CAD (coronary artery disease)    cardiac CT with a 50% LAD lesion in 2007  . Diverticulosis   . DM2 (diabetes mellitus, type 2) ([place])   . Elevated LFTs    secondary to labetalol  . Esophageal stricture 2003  . External hemorrhoids 2012   lanced by Dr Rise Patience  . Family history of anesthesia complication    "sister had PONV"  . GERD (gastroesophageal reflux disease)    esophageal stricture, pmh  . Hiatal hernia 01/04/00  . HLD (hyperlipidemia)   . HTN (hypertension)   . Hypopotassemia     Past Surgical History:  Procedure Laterality Date  . BACK SURGERY    . CARDIAC CATHETERIZATION  2007; 03/26/2014  . COLONOSCOPY  2006   tics , ADENOMATOUS polyps. F/U due 2011  . COLONOSCOPY  2012   Tics  . CORONARY ANGIOPLASTY WITH STENT PLACEMENT  04/01/2014   "2"  . ESOPHAGEAL DILATION  2003  . ESOPHAGOGASTRODUODENOSCOPY  2003   ERD, stricture dilated  . EXCISIONAL HEMORRHOIDECTOMY  ~ 2008  . LEFT HEART CATHETERIZATION WITH CORONARY ANGIOGRAM  N/A 03/26/2014   Procedure: LEFT HEART CATHETERIZATION WITH CORONARY ANGIOGRAM;  Surgeon: Fred Hammes M Martinique, MD;  Location: [facility] CATH LAB;  Service: Cardiovascular;  Laterality: N/A;  . PERCUTANEOUS CORONARY STENT INTERVENTION (PCI-S) N/A 04/01/2014   Procedure: PERCUTANEOUS CORONARY STENT INTERVENTION (PCI-S);  Surgeon: Coltrane Tugwell M Martinique, MD;  Location: [facility] CATH LAB;  Service: Cardiovascular;  Laterality: N/A;  . POSTERIOR LAMINECTOMY / DECOMPRESSION LUMBAR SPINE  12/2009   L4 , Dr Sherwood Gambler    History  Smoking Status  . Never Smoker  Smokeless Tobacco  . Never Used    History  Alcohol Use  . 1.2 oz/week  . 2 Cans of beer per week    Family History  Problem Relation Age of Onset  .  Heart attack Mother 74  . Stroke Mother 2  . Prostate cancer Father   . Diabetes Father   . Heart disease Father 62    CBAG   . Diabetes Brother   . Breast cancer Maternal Aunt   . Heart attack Maternal Grandfather 61  . Diabetes Paternal Grandmother   . Stroke Paternal Grandfather 23    Review of Systems: As noted in HPI. All other systems were reviewed and are negative.  Physical Exam: BP 130/76   Pulse (!) 52   Ht 5\' 9"  (1.753 m)   Wt 189 lb (85.7 kg)   BMI 27.91 kg/m   He is a pleasant white male in no acute distress. He is overweight. HEENT: Normal. Neck is supple without JVD, adenopathy, thyromegaly, or bruits. Lungs: Clear Cardiovascular: Regular rate and rhythm. Normal S1 and S2. No gallop, murmur, or click. Abdomen: Obese, soft, and nontender. No hepatosplenomegaly. No masses or bruits. Extremities: No cyanosis or edema. Pulses are 2+ and symmetric throughout. No hematoma at radial cath site.  Skin: Warm and dry Neuro: Alert and oriented x3. Cranial nerves II through XII are intact. No focal motor or sensory deficits.  LABORATORY DATA: Lab Results  Component Value Date   WBC 12.1 (H) 04/02/2014   HGB 13.5 04/02/2014   HCT 39.3 04/02/2014   PLT 226 04/02/2014   GLUCOSE 113 (H) 07/09/2015   CHOL 123 (L) 07/09/2015   TRIG 119 07/09/2015   HDL 44 07/09/2015   LDLCALC 55 07/09/2015   ALT 24 07/09/2015   AST 18 07/09/2015   NA 143 07/09/2015   K 4.5 07/09/2015   CL 103 07/09/2015   CREATININE 1.00 07/09/2015   BUN 12 07/09/2015   CO2 26 07/09/2015   TSH 1.09 03/19/2014   PSA 1.50 08/10/2015   INR 1.0 03/19/2014   HGBA1C 6.6 (H) 08/10/2015   MICROALBUR 0.8 03/19/2014   Ecg today shows NSR with normal Ecg. I have personally reviewed and interpreted this study.  Assessment / Plan: 1. Coronary disease s/p DES stenting of the LAD and first diagonal in October 2015. Normal FFR of OM2. Will continue Plavix long term.  Patient is allergic to ASA. Continue risk  factor modification.    2. Hyperlipidemia.  on lipitor. Will arrange fasting lab work  3. Diabetes mellitus type 2. Under improved control. Continue dietary modification. Check A1c.  4. Hypertension-controlled.  5. Contrast dye allergy with hives- despite pretreatment.  I will follow up in 6 months.

## 2016-08-16 ENCOUNTER — Encounter: Payer: Self-pay | Admitting: Cardiology

## 2016-08-16 ENCOUNTER — Ambulatory Visit (INDEPENDENT_AMBULATORY_CARE_PROVIDER_SITE_OTHER): Payer: 59 | Admitting: Cardiology

## 2016-08-16 VITALS — BP 130/76 | HR 52 | Ht 69.0 in | Wt 189.0 lb

## 2016-08-16 DIAGNOSIS — E7800 Pure hypercholesterolemia, unspecified: Secondary | ICD-10-CM | POA: Diagnosis not present

## 2016-08-16 DIAGNOSIS — I10 Essential (primary) hypertension: Secondary | ICD-10-CM | POA: Diagnosis not present

## 2016-08-16 DIAGNOSIS — I25110 Atherosclerotic heart disease of native coronary artery with unstable angina pectoris: Secondary | ICD-10-CM

## 2016-08-16 MED ORDER — IRBESARTAN 300 MG PO TABS: ORAL_TABLET | ORAL | 3 refills | Status: DC

## 2016-08-16 MED ORDER — SPIRONOLACTONE 25 MG PO TABS: ORAL_TABLET | ORAL | 3 refills | Status: DC

## 2016-08-16 MED ORDER — CLOPIDOGREL BISULFATE 75 MG PO TABS: 75.0000 mg | ORAL_TABLET | Freq: Every day | ORAL | 3 refills | Status: DC

## 2016-08-16 MED ORDER — CARVEDILOL 3.125 MG PO TABS: ORAL_TABLET | ORAL | 3 refills | Status: DC

## 2016-08-16 NOTE — Patient Instructions (Addendum)
We will check blood work  Continue your current therapy  I will see you in 6 months.   

## 2016-08-16 NOTE — Addendum Note (Signed)
Addended by: Kathyrn Lass on: 08/16/2016 04:41 PM   Modules accepted: Orders

## 2016-08-19 LAB — BASIC METABOLIC PANEL
BUN / CREAT RATIO: 15 (ref 10–24)
BUN: 13 mg/dL (ref 8–27)
CALCIUM: 9.4 mg/dL (ref 8.6–10.2)
CHLORIDE: 101 mmol/L (ref 96–106)
CO2: 23 mmol/L (ref 18–29)
Creatinine, Ser: 0.84 mg/dL (ref 0.76–1.27)
GFR, EST AFRICAN AMERICAN: 105 (ref >59)
GFR, EST NON AFRICAN AMERICAN: 91 (ref >59)
Glucose: 137 mg/dL — ABNORMAL HIGH (ref 65–99)
POTASSIUM: 4.4 mmol/L (ref 3.5–5.2)
SODIUM: 143 mmol/L (ref 134–144)

## 2016-08-19 LAB — LIPID PANEL
CHOL/HDL RATIO: 3.2 (ref 0.0–5.0)
Cholesterol, Total: 132 mg/dL (ref 100–199)
HDL: 41 mg/dL (ref >39)
LDL Calculated: 67 (ref 0–99)
Triglycerides: 121 mg/dL (ref 0–149)
VLDL Cholesterol Cal: 24 (ref 5–40)

## 2016-08-19 LAB — CBC WITH DIFFERENTIAL/PLATELET
BASOS: 0 %
Basophils Absolute: 0 10*3/uL (ref 0.0–0.2)
EOS (ABSOLUTE): 0.2 10*3/uL (ref 0.0–0.4)
EOS: 3 %
HEMATOCRIT: 44.1 % (ref 37.5–51.0)
Hemoglobin: 14.9 g/dL (ref 13.0–17.7)
IMMATURE GRANS (ABS): 0 10*3/uL (ref 0.0–0.1)
IMMATURE GRANULOCYTES: 0 %
LYMPHS: 16 %
Lymphocytes Absolute: 1.2 10*3/uL (ref 0.7–3.1)
MCH: 32.2 pg (ref 26.6–33.0)
MCHC: 33.8 g/dL (ref 31.5–35.7)
MCV: 95 fL (ref 79–97)
MONOCYTES: 8 %
MONOS ABS: 0.6 10*3/uL (ref 0.1–0.9)
NEUTROS PCT: 73 %
Neutrophils Absolute: 5.4 10*3/uL (ref 1.4–7.0)
Platelets: 259 10*3/uL (ref 150–379)
RBC: 4.63 x10E6/uL (ref 4.14–5.80)
RDW: 13.7 % (ref 12.3–15.4)
WBC: 7.4 10*3/uL (ref 3.4–10.8)

## 2016-08-19 LAB — HEPATIC FUNCTION PANEL
ALBUMIN: 4.3 g/dL (ref 3.6–4.8)
ALT: 31 IU/L (ref 0–44)
AST: 17 IU/L (ref 0–40)
Alkaline Phosphatase: 75 IU/L (ref 39–117)
BILIRUBIN, DIRECT: 0.16 mg/dL (ref 0.00–0.40)
Bilirubin Total: 0.6 mg/dL (ref 0.0–1.2)
TOTAL PROTEIN: 6.7 g/dL (ref 6.0–8.5)

## 2016-08-19 LAB — HEMOGLOBIN A1C
Est. average glucose Bld gHb Est-mCnc: 140
Hgb A1c MFr Bld: 6.5 % — ABNORMAL HIGH (ref 4.8–5.6)

## 2016-09-14 LAB — HM DIABETES EYE EXAM

## 2016-09-21 ENCOUNTER — Encounter: Payer: Self-pay | Admitting: Family

## 2017-01-02 ENCOUNTER — Telehealth: Payer: Self-pay | Admitting: Cardiology

## 2017-01-02 ENCOUNTER — Telehealth: Payer: Self-pay | Admitting: Family

## 2017-01-02 MED ORDER — IRBESARTAN 300 MG PO TABS: ORAL_TABLET | ORAL | 3 refills | Status: DC

## 2017-01-02 MED ORDER — SPIRONOLACTONE 25 MG PO TABS
ORAL_TABLET | ORAL | 3 refills | Status: DC
Start: 2017-01-02 — End: 2018-01-08

## 2017-01-02 MED ORDER — ATORVASTATIN CALCIUM 80 MG PO TABS: ORAL_TABLET | ORAL | 3 refills | Status: DC

## 2017-01-02 MED ORDER — CLOPIDOGREL BISULFATE 75 MG PO TABS: 75.0000 mg | ORAL_TABLET | Freq: Every day | ORAL | 3 refills | Status: DC

## 2017-01-02 MED ORDER — CARVEDILOL 3.125 MG PO TABS: ORAL_TABLET | ORAL | 3 refills | Status: DC

## 2017-01-02 NOTE — Telephone Encounter (Signed)
Pt would like a refill of metFORMIN (GLUCOPHAGE) 500 MG tablet  [facility]  No longer using [facility]

## 2017-01-02 NOTE — Telephone Encounter (Signed)
Returned call to patient 90 day refills sent to [facility] order pharmacy.Advised PCP will need to refill metformin.

## 2017-01-02 NOTE — Telephone Encounter (Signed)
New message    Patient calling     *STAT* If patient is at the pharmacy, call can be transferred to refill team.   1. Which medications need to be refilled? (please list name of each medication and dose if known) irbesartan (AVAPRO) 300 MG tablet,atorvastatin (LIPITOR) 80 MG tablet,spironolactone (ALDACTONE) 25 MG tablet,carvedilol (COREG) 3.125 MG tablet,clopidogrel (PLAVIX) 75 MG tablet, metFORMIN (GLUCOPHAGE) 500 MG tablet  2. Which pharmacy/location (including street and city if local pharmacy) is medication to be sent to? [facility] -  / ID  # [id number] / phone # [phone] fax # [phone]  3. Do they need a 30 day or 90 day supply? 90 day supply

## 2017-01-02 NOTE — Telephone Encounter (Signed)
Returned call to patient no answer.LMTC. 

## 2017-01-02 NOTE — Telephone Encounter (Signed)
Follow up    Pt is returning call to [facility]

## 2017-01-02 NOTE — Telephone Encounter (Signed)
F/u message  Pt call to f/u on prescriptions. Please call back to discuss

## 2017-01-03 MED ORDER — METFORMIN HCL 500 MG PO TABS: ORAL_TABLET | ORAL | 3 refills | Status: DC

## 2017-01-03 NOTE — Telephone Encounter (Signed)
Medication refilled

## 2017-01-03 NOTE — Addendum Note (Signed)
Addended by: Mauricio Po D on: 01/03/2017 08:40 AM   Modules accepted: Orders

## 2017-01-03 NOTE — Telephone Encounter (Signed)
LVM letting pt know.  

## 2017-01-17 ENCOUNTER — Encounter: Payer: Self-pay | Admitting: Family

## 2017-01-17 ENCOUNTER — Other Ambulatory Visit (INDEPENDENT_AMBULATORY_CARE_PROVIDER_SITE_OTHER): Payer: Medicare Other

## 2017-01-17 ENCOUNTER — Ambulatory Visit (INDEPENDENT_AMBULATORY_CARE_PROVIDER_SITE_OTHER): Payer: Medicare Other | Admitting: Family

## 2017-01-17 VITALS — BP 110/78 | HR 50 | Temp 97.9°F | Resp 16 | Ht 69.0 in | Wt 183.0 lb

## 2017-01-17 DIAGNOSIS — E669 Obesity, unspecified: Secondary | ICD-10-CM | POA: Diagnosis not present

## 2017-01-17 DIAGNOSIS — I10 Essential (primary) hypertension: Secondary | ICD-10-CM

## 2017-01-17 DIAGNOSIS — Z125 Encounter for screening for malignant neoplasm of prostate: Secondary | ICD-10-CM

## 2017-01-17 DIAGNOSIS — I2510 Atherosclerotic heart disease of native coronary artery without angina pectoris: Secondary | ICD-10-CM

## 2017-01-17 DIAGNOSIS — E7800 Pure hypercholesterolemia, unspecified: Secondary | ICD-10-CM | POA: Diagnosis not present

## 2017-01-17 DIAGNOSIS — Z23 Encounter for immunization: Secondary | ICD-10-CM | POA: Diagnosis not present

## 2017-01-17 DIAGNOSIS — E1169 Type 2 diabetes mellitus with other specified complication: Secondary | ICD-10-CM

## 2017-01-17 DIAGNOSIS — Z0000 Encounter for general adult medical examination without abnormal findings: Secondary | ICD-10-CM

## 2017-01-17 DIAGNOSIS — Z7902 Long term (current) use of antithrombotics/antiplatelets: Secondary | ICD-10-CM | POA: Insufficient documentation

## 2017-01-17 LAB — CBC
HEMATOCRIT: 45.8 % (ref 39.0–52.0)
HEMOGLOBIN: 15.3 g/dL (ref 13.0–17.0)
MCHC: 33.3 g/dL (ref 30.0–36.0)
MCV: 100.5 fl — AB (ref 78.0–100.0)
PLATELETS: 235 10*3/uL (ref 150.0–400.0)
RBC: 4.56 Mil/uL (ref 4.22–5.81)
RDW: 13.4 % (ref 11.5–15.5)
WBC: 7.7 10*3/uL (ref 4.0–10.5)

## 2017-01-17 LAB — COMPREHENSIVE METABOLIC PANEL
ALBUMIN: 4.3 g/dL (ref 3.5–5.2)
ALK PHOS: 67 U/L (ref 39–117)
ALT: 18 U/L (ref 0–53)
AST: 16 U/L (ref 0–37)
BILIRUBIN TOTAL: 1.2 mg/dL (ref 0.2–1.2)
BUN: 16 mg/dL (ref 6–23)
CO2: 27 mEq/L (ref 19–32)
Calcium: 9.7 mg/dL (ref 8.4–10.5)
Chloride: 107 mEq/L (ref 96–112)
Creatinine, Ser: 0.91 mg/dL (ref 0.40–1.50)
GFR: 88.33 mL/min (ref >60.00)
GLUCOSE: 124 mg/dL — AB (ref 70–99)
POTASSIUM: 4.6 meq/L (ref 3.5–5.1)
SODIUM: 140 meq/L (ref 135–145)
TOTAL PROTEIN: 6.6 g/dL (ref 6.0–8.3)

## 2017-01-17 LAB — LIPID PANEL
CHOLESTEROL: 133 mg/dL (ref 0–200)
HDL: 40.8 mg/dL (ref >39.00)
LDL Cholesterol: 65 mg/dL (ref 0–99)
NONHDL: 92.38
Total CHOL/HDL Ratio: 3
Triglycerides: 135 mg/dL (ref 0.0–149.0)
VLDL: 27 mg/dL (ref 0.0–40.0)

## 2017-01-17 LAB — PSA, MEDICARE: PSA: 1.26 ng/mL (ref 0.10–4.00)

## 2017-01-17 LAB — MICROALBUMIN / CREATININE URINE RATIO
CREATININE, U: 173.5 mg/dL
MICROALB UR: 0.7 mg/dL (ref 0.0–1.9)
MICROALB/CREAT RATIO: 0.4 mg/g (ref 0.0–30.0)

## 2017-01-17 LAB — HEMOGLOBIN A1C: HEMOGLOBIN A1C: 6.7 % — AB (ref 4.6–6.5)

## 2017-01-17 MED ORDER — SILDENAFIL CITRATE 20 MG PO TABS: ORAL_TABLET | ORAL | 0 refills | Status: DC

## 2017-01-17 MED ORDER — ONETOUCH ULTRA MINI W/DEVICE KIT: PACK | 0 refills | Status: DC

## 2017-01-17 MED ORDER — GLUCOSE BLOOD VI STRP: ORAL_STRIP | 5 refills | Status: DC

## 2017-01-17 NOTE — Assessment & Plan Note (Signed)
Type 2 diabetes previously well controlled with current medication regimen and no adverse side effects. Blood sugars remained stable. Patient requesting new blood sugar monitoring equipment which has been sent to pharmacy. Continue current dosage of metformin pending A1c results. Obtain urine microalbumin. Diabetic foot exam completed today. Diabetic eye exam is up-to-date per recommendations. Maintained on Avapro and Lipitor for CAD risk reduction. Prevnar updated today. Continue to monitor.

## 2017-01-17 NOTE — Assessment & Plan Note (Signed)
Reviewed and updated patient's medical, surgical, family and social history. Medications and allergies were also reviewed. Basic screenings for depression, activities of daily living, hearing, cognition and safety were performed. Provider list was updated and health plan was provided to the patient.   Prevnar updated today with plan for Pneumovax and 6-12 months. All other immunizations are up-to-date per recommendations. Obtain PSA for prostate cancer screening. Colon cancer screening is up-to-date per recommendations. Diabetic foot exam completed today. Obtain hepatitis C antibody for hepatitis C screening. All other screenings are up-to-date per recommendations.  Overall well exam with risk factors for cardiovascular disease including diabetes, hypertension, and hyperlipidemia as well as coronary artery disease. Chronic conditions appear stable with current medication regimens and no adverse side effects. Encouraged continued physical activity and a nutritional intake that is moderate, balance, and varied. Continue healthy lifestyle behaviors and choices. Follow-up prevention exam in 1 year. Follow-up office visit in 6 months for chronic conditions or sooner pending blood work.

## 2017-01-17 NOTE — Assessment & Plan Note (Signed)
Stable with current dosage of atorvastatin and no adverse side effects or myalgias. Continue current dosage of atorvastatin pending lipid profile results.

## 2017-01-17 NOTE — Patient Instructions (Addendum)
Thank you for choosing [facility].  SUMMARY AND INSTRUCTIONS:  Continue to take medications as prescribed.  Follow up in 6 months pending blood work.   Labs:  Please stop by the lab on the lower level of the building for your blood work. Your results will be released to [facility] (or called to you) after review, usually within 72 hours after test completion. If any changes need to be made, you will be notified at that same time.  1.) The lab is open from 7:30am to 5:30 pm Monday-Friday 2.) No appointment is necessary 3.) Fasting (if needed) is 6-8 hours after food and drink; black coffee and water are okay   Follow up:  If your symptoms worsen or fail to improve, please contact our office for further instruction, or in case of emergency go directly to the emergency room at the closest medical facility.    Health Maintenance  Topic Date Due  . Hepatitis C Screening  07/14/1950  . PNA vac Low Risk Adult (1 of 2 - PCV13) 01/22/2015  . FOOT EXAM  03/20/2015  . COLONOSCOPY  04/24/2016  . INFLUENZA VACCINE  06/26/2098 (Originally 01/25/2017)  . HEMOGLOBIN A1C  02/15/2017  . OPHTHALMOLOGY EXAM  09/14/2017  . TETANUS/TDAP  12/19/2018    Health Maintenance, Male A healthy lifestyle and preventive care is important for your health and wellness. Ask your health care provider about what schedule of regular examinations is right for you. What should I know about weight and diet? Eat a Healthy Diet  Eat plenty of vegetables, fruits, whole grains, low-fat dairy products, and lean protein.  Do not eat a lot of foods high in solid fats, added sugars, or salt.  Maintain a Healthy Weight Regular exercise can help you achieve or maintain a healthy weight. You should:  Do at least 150 minutes of exercise each week. The exercise should increase your heart rate and make you sweat (moderate-intensity exercise).  Do strength-training exercises at least twice a week.  Watch Your Levels of  Cholesterol and Blood Lipids  Have your blood tested for lipids and cholesterol every 5 years starting at 67 years of age. If you are at high risk for heart disease, you should start having your blood tested when you are 67 years old. You may need to have your cholesterol levels checked more often if: ? Your lipid or cholesterol levels are high. ? You are older than 67 years of age. ? You are at high risk for heart disease.  What should I know about cancer screening? Many types of cancers can be detected early and may often be prevented. Lung Cancer  You should be screened every year for lung cancer if: ? You are a current smoker who has smoked for at least 30 years. ? You are a former smoker who has quit within the past 15 years.  Talk to your health care provider about your screening options, when you should start screening, and how often you should be screened.  Colorectal Cancer  Routine colorectal cancer screening usually begins at 67 years of age and should be repeated every 5-10 years until you are 67 years old. You may need to be screened more often if early forms of precancerous polyps or small growths are found. Your health care provider may recommend screening at an earlier age if you have risk factors for colon cancer.  Your health care provider may recommend using home test kits to check for hidden blood in the stool.  A small camera at the end of a tube can be used to examine your colon (sigmoidoscopy or colonoscopy). This checks for the earliest forms of colorectal cancer.  Prostate and Testicular Cancer  Depending on your age and overall health, your health care provider may do certain tests to screen for prostate and testicular cancer.  Talk to your health care provider about any symptoms or concerns you have about testicular or prostate cancer.  Skin Cancer  Check your skin from head to toe regularly.  Tell your health care provider about any new moles or changes  in moles, especially if: ? There is a change in a mole's size, shape, or color. ? You have a mole that is larger than a pencil eraser.  Always use sunscreen. Apply sunscreen liberally and repeat throughout the day.  Protect yourself by wearing long sleeves, pants, a wide-brimmed hat, and sunglasses when outside.  What should I know about heart disease, diabetes, and high blood pressure?  If you are 67-71 years of age, have your blood pressure checked every 3-5 years. If you are 57 years of age or older, have your blood pressure checked every year. You should have your blood pressure measured twice-once when you are at a hospital or clinic, and once when you are not at a hospital or clinic. Record the average of the two measurements. To check your blood pressure when you are not at a hospital or clinic, you can use: ? An automated blood pressure machine at a pharmacy. ? A home blood pressure monitor.  Talk to your health care provider about your target blood pressure.  If you are between 1-36 years old, ask your health care provider if you should take aspirin to prevent heart disease.  Have regular diabetes screenings by checking your fasting blood sugar level. ? If you are at a normal weight and have a low risk for diabetes, have this test once every three years after the age of 104. ? If you are overweight and have a high risk for diabetes, consider being tested at a younger age or more often.  A one-time screening for abdominal aortic aneurysm (AAA) by ultrasound is recommended for men aged 42-75 years who are current or former smokers. What should I know about preventing infection? Hepatitis B If you have a higher risk for hepatitis B, you should be screened for this virus. Talk with your health care provider to find out if you are at risk for hepatitis B infection. Hepatitis C Blood testing is recommended for:  Everyone born from [birth year] through 1966.  Anyone with known risk factors  for hepatitis C.  Sexually Transmitted Diseases (STDs)  You should be screened each year for STDs including gonorrhea and chlamydia if: ? You are sexually active and are younger than 67 years of age. ? You are older than 67 years of age and your health care provider tells you that you are at risk for this type of infection. ? Your sexual activity has changed since you were last screened and you are at an increased risk for chlamydia or gonorrhea. Ask your health care provider if you are at risk.  Talk with your health care provider about whether you are at high risk of being infected with HIV. Your health care provider may recommend a prescription medicine to help prevent HIV infection.  What else can I do?  Schedule regular health, dental, and eye exams.  Stay current with your vaccines (immunizations).  Do not use  any tobacco products, such as cigarettes, chewing tobacco, and e-cigarettes. If you need help quitting, ask your health care provider.  Limit alcohol intake to no more than 2 drinks per day. One drink equals 12 ounces of beer, 5 ounces of wine, or 1 ounces of hard liquor.  Do not use street drugs.  Do not share needles.  Ask your health care provider for help if you need support or information about quitting drugs.  Tell your health care provider if you often feel depressed.  Tell your health care provider if you have ever been abused or do not feel safe at home. This information is not intended to replace advice given to you by your health care provider. Make sure you discuss any questions you have with your health care provider. Document Released: 12/10/2007 Document Revised: 02/10/2016 Document Reviewed: 03/17/2015 Elsevier Interactive Patient Education  2018 [place].   Cervical Strain and Sprain Rehab Ask your health care provider which exercises are safe for you. Do exercises exactly as told by your health care provider and adjust them as directed. It is  normal to feel mild stretching, pulling, tightness, or discomfort as you do these exercises, but you should stop right away if you feel sudden pain or your pain gets worse.Do not begin these exercises until told by your health care provider. Stretching and range of motion exercises These exercises warm up your muscles and joints and improve the movement and flexibility of your neck. These exercises also help to relieve pain, numbness, and tingling. Exercise A: Cervical side bend  1. Using good posture, sit on a stable chair or stand up. 2. Without moving your shoulders, slowly tilt your left / right ear to your shoulder until you feel a stretch in your neck muscles. You should be looking straight ahead. 3. Hold for __________ seconds. 4. Repeat with the other side of your neck. Repeat __________ times. Complete this exercise __________ times a day. Exercise B: Cervical rotation  1. Using good posture, sit on a stable chair or stand up. 2. Slowly turn your head to the side as if you are looking over your left / right shoulder. ? Keep your eyes level with the ground. ? Stop when you feel a stretch along the side and the back of your neck. 3. Hold for __________ seconds. 4. Repeat this by turning to your other side. Repeat __________ times. Complete this exercise __________ times a day. Exercise C: Thoracic extension and pectoral stretch 1. Roll a towel or a small blanket so it is about 4 inches (10 cm) in diameter. 2. Lie down on your back on a firm surface. 3. Put the towel lengthwise, under your spine in the middle of your back. It should not be not under your shoulder blades. The towel should line up with your spine from your middle back to your lower back. 4. Put your hands behind your head and let your elbows fall out to your sides. 5. Hold for __________ seconds. Repeat __________ times. Complete this exercise __________ times a day. Strengthening exercises These exercises build  strength and endurance in your neck. Endurance is the ability to use your muscles for a long time, even after your muscles get tired. Exercise D: Upper cervical flexion, isometric 1. Lie on your back with a thin pillow behind your head and a small rolled-up towel under your neck. 2. Gently tuck your chin toward your chest and nod your head down to look toward your feet. Do not  lift your head off the pillow. 3. Hold for __________ seconds. 4. Release the tension slowly. Relax your neck muscles completely before you repeat this exercise. Repeat __________ times. Complete this exercise __________ times a day. Exercise E: Cervical extension, isometric  1. Stand about 6 inches (15 cm) away from a wall, with your back facing the wall. 2. Place a soft object, about 6-8 inches (15-20 cm) in diameter, between the back of your head and the wall. A soft object could be a small pillow, a ball, or a folded towel. 3. Gently tilt your head back and press into the soft object. Keep your jaw and forehead relaxed. 4. Hold for __________ seconds. 5. Release the tension slowly. Relax your neck muscles completely before you repeat this exercise. Repeat __________ times. Complete this exercise __________ times a day. Posture and body mechanics  Body mechanics refers to the movements and positions of your body while you do your daily activities. Posture is part of body mechanics. Good posture and healthy body mechanics can help to relieve stress in your body's tissues and joints. Good posture means that your spine is in its natural S-curve position (your spine is neutral), your shoulders are pulled back slightly, and your head is not tipped forward. The following are general guidelines for applying improved posture and body mechanics to your everyday activities. Standing  When standing, keep your spine neutral and keep your feet about hip-width apart. Keep a slight bend in your knees. Your ears, shoulders, and hips  should line up.  When you do a task in which you stand in one place for a long time, place one foot up on a stable object that is 2-4 inches (5-10 cm) high, such as a footstool. This helps keep your spine neutral. Sitting   When sitting, keep your spine neutral and your keep feet flat on the floor. Use a footrest, if necessary, and keep your thighs parallel to the floor. Avoid rounding your shoulders, and avoid tilting your head forward.  When working at a desk or a computer, keep your desk at a height where your hands are slightly lower than your elbows. Slide your chair under your desk so you are close enough to maintain good posture.  When working at a computer, place your monitor at a height where you are looking straight ahead and you do not have to tilt your head forward or downward to look at the screen. Resting When lying down and resting, avoid positions that are most painful for you. Try to support your neck in a neutral position. You can use a contour pillow or a small rolled-up towel. Your pillow should support your neck but not push on it. This information is not intended to replace advice given to you by your health care provider. Make sure you discuss any questions you have with your health care provider. Document Released: 06/13/2005 Document Revised: 02/18/2016 Document Reviewed: 05/20/2015 Elsevier Interactive Patient Education  [employer].

## 2017-01-17 NOTE — Assessment & Plan Note (Signed)
Blood pressure well-controlled and below goal 140/90 with current medication regimen and no adverse side effects or hypotensive readings. Continue current dosage of spironolactone, Avapro, and carvedilol.

## 2017-01-17 NOTE — Assessment & Plan Note (Signed)
Coronary artery disease appears stable with no current angina or cardiac symptoms. Continue risk factor management of diabetes, hypertension, and hyperlipidemia.

## 2017-01-17 NOTE — Progress Notes (Signed)
Subjective:    Patient ID: [name] [name], male    DOB: Mar 07, 1951, 66 y.o.   MRN: [id number]  Chief Complaint  Patient presents with  . Medicare Wellness    fasting    HPI:  [name] [name] is a 66 y.o. male who presents today for a Medicare Annual Wellness/Physical exam.    1) Health Maintenance -   Diet -  Average about 3 meals per day consisting of a regular diet; Caffeine intake of 1-2 cups daily  Exercise -  Walking about 1 hour per day and back strengthening and stretching  2) Preventative Exams / Immunizations:  Dental -- Up to date  Vision -- Up to date   Health Maintenance  Topic Date Due  . Hepatitis C Screening  08-22-1950  . PNA vac Low Risk Adult (1 of 2 - PCV13) 01/22/2015  . FOOT EXAM  03/20/2015  . COLONOSCOPY  03/27/2021 (Originally 04/24/2016)  . INFLUENZA VACCINE  06/26/2098 (Originally 01/25/2017)  . HEMOGLOBIN A1C  02/15/2017  . OPHTHALMOLOGY EXAM  09/14/2017  . TETANUS/TDAP  12/19/2018     Immunization History  Administered Date(s) Administered  . Influenza,inj,Quad PF,36+ Mos 03/19/2014  . Pneumococcal Conjugate-13 01/17/2017  . Td 12/18/2008  . Zoster 12/24/2012    RISK FACTORS  Tobacco History  Smoking Status  . Never Smoker  Smokeless Tobacco  . Never Used     Cardiac risk factors: advanced age (older than 2 for men, 86 for women), diabetes mellitus, dyslipidemia, hypertension and male gender.  Depression Screen  Depression screen [facility] 2/9 01/17/2017  Decreased Interest 0  Down, Depressed, Hopeless 0  PHQ - 2 Score 0     Activities of Daily Living In your present state of health, do you have any difficulty performing the following activities?:  Driving? No Managing money?  No Feeding yourself? No Getting from bed to chair? No Climbing a flight of stairs? No Preparing food and eating?: No Bathing or showering? No Getting dressed: No Getting to the toilet? No Using the toilet: No Moving around from place to  place: No In the past year have you fallen or had a near fall?:No   Home Safety Has smoke detector and wears seat belts. No excess sun exposure. Are there smokers in your home (other than you)?  No Do you feel safe at home?  Yes  Hearing Difficulties: No Do you often ask people to speak up or repeat themselves? No Do you experience ringing or noises in your ears? No  Do you have difficulty understanding soft or whispered voices? No    Cognitive Testing  Alert? Yes   Normal Appearance? Yes  Oriented to person? Yes  Place? Yes   Time? Yes  Recall of three objects?  Yes  Can perform simple calculations? Yes  Displays appropriate judgment? Yes  Can read the correct time from a watch face? Yes  Do you feel that you have a problem with memory? No  Do you often misplace items? No   Advanced Directives have been discussed with the patient? Yes   Current Physicians/Providers and Suppliers  1. Terri Piedra, FNP - Internal Medicine 2. [name], MD - Cardiology  Indicate any recent Medical Services you may have received from other than [facility] providers in the past year (date may be approximate).  All answers were reviewed with the patient and necessary referrals were made:  Mauricio Po, [place]   01/17/2017    Allergies  Allergen Reactions  . Aspirin  Other (See Comments)    Angioedema  . Contrast Media [Iodinated Diagnostic Agents] Itching and Rash  . Norvasc [Amlodipine Besylate] Other (See Comments)    09/12/2013 gingival hyperplasia , Dr Geralynn Ochs ,DDS     Outpatient Medications Prior to Visit  Medication Sig Dispense Refill  . atorvastatin (LIPITOR) 80 MG tablet Take 1 tablet by mouth  daily at 6pm 90 tablet 3  . carvedilol (COREG) 3.125 MG tablet Take 1 tablet (3.125 mg) by mouth 2 (two) times daily  with a meal. 180 tablet 3  . CINNAMON PO Take 2 tablets by mouth daily.    . clopidogrel (PLAVIX) 75 MG tablet Take 1 tablet (75 mg total) by mouth daily. 90 tablet 3  .  Coenzyme Q10 (CO Q 10 PO) Take 1 tablet by mouth daily.    . Cyanocobalamin (VITAMIN B 12 PO) Take 1 tablet by mouth daily.    . irbesartan (AVAPRO) 300 MG tablet Take 1 tablet by mouth  daily 90 tablet 3  . metFORMIN (GLUCOPHAGE) 500 MG tablet Take 1 tablet by mouth  twice a day with a meal 180 tablet 3  . Multiple Vitamins-Minerals (MULTIVITAMIN WITH MINERALS) tablet Take 1 tablet by mouth daily.    . nitroGLYCERIN (NITROSTAT) 0.4 MG SL tablet Place 1 tablet (0.4 mg total) under the tongue every 5 (five) minutes as needed for chest pain. 25 tablet 3  . Omega-3 Fatty Acids (FISH OIL) 1200 MG CPDR Take 1,200 mg by mouth 2 (two) times daily.    Marland Kitchen spironolactone (ALDACTONE) 25 MG tablet Take 1 tablet by mouth  daily 90 tablet 3  . ONE TOUCH ULTRA TEST test strip Check blood sugar once daily 100 each 12  . ONETOUCH DELICA LANCETS FINE MISC 1 application by Does not apply route daily. 100 each 12   No facility-administered medications prior to visit.      Past Medical History:  Diagnosis Date  . Adenomatous colon polyp 09/07/2004  . Amblyopia   . CAD (coronary artery disease)    cardiac CT with a 50% LAD lesion in 2007  . Diverticulosis   . DM2 (diabetes mellitus, type 2) ([place])   . Elevated LFTs    secondary to labetalol  . Esophageal stricture 2003  . External hemorrhoids 2012   lanced by Dr Rise Patience  . Family history of anesthesia complication    "sister had PONV"  . GERD (gastroesophageal reflux disease)    esophageal stricture, pmh  . Hiatal hernia 01/04/00  . HLD (hyperlipidemia)   . HTN (hypertension)   . Hypopotassemia      Past Surgical History:  Procedure Laterality Date  . BACK SURGERY    . CARDIAC CATHETERIZATION  2007; 03/26/2014  . COLONOSCOPY  2006   tics , ADENOMATOUS polyps. F/U due 2011  . COLONOSCOPY  2012   Tics  . CORONARY ANGIOPLASTY WITH STENT PLACEMENT  04/01/2014   "2"  . ESOPHAGEAL DILATION  2003  . ESOPHAGOGASTRODUODENOSCOPY  2003   ERD,  stricture dilated  . EXCISIONAL HEMORRHOIDECTOMY  ~ 2008  . LEFT HEART CATHETERIZATION WITH CORONARY ANGIOGRAM N/A 03/26/2014   Procedure: LEFT HEART CATHETERIZATION WITH CORONARY ANGIOGRAM;  Surgeon: Peter M Martinique, MD;  Location: [facility] CATH LAB;  Service: Cardiovascular;  Laterality: N/A;  . PERCUTANEOUS CORONARY STENT INTERVENTION (PCI-S) N/A 04/01/2014   Procedure: PERCUTANEOUS CORONARY STENT INTERVENTION (PCI-S);  Surgeon: Peter M Martinique, MD;  Location: [facility] CATH LAB;  Service: Cardiovascular;  Laterality: N/A;  . POSTERIOR LAMINECTOMY / DECOMPRESSION  LUMBAR SPINE  12/2009   L4 , Dr Sherwood Gambler     Family History  Problem Relation Age of Onset  . Heart attack Mother 40  . Stroke Mother 77  . Prostate cancer Father   . Diabetes Father   . Heart disease Father 2       CBAG   . Diabetes Brother   . Breast cancer Maternal Aunt   . Heart attack Maternal Grandfather 61  . Diabetes Paternal Grandmother   . Stroke Paternal Grandfather 22     Social History   Social History  . Marital status: Single    Spouse name: N/A  . Number of children: 0  . Years of education: 37   Occupational History  . Retired     Social History Main Topics  . Smoking status: Never Smoker  . Smokeless tobacco: Never Used  . Alcohol use 1.2 oz/week    2 Cans of beer per week  . Drug use: No  . Sexual activity: Not Currently   Other Topics Concern  . Not on file   Social History Narrative   Fun: Designer, jewellery.      Review of Systems  Constitutional: Denies fever, chills, fatigue, or significant weight gain/loss. HENT: Head: Denies headache or neck pain Ears: Denies changes in hearing, ringing in ears, earache, drainage Nose: Denies discharge, stuffiness, itching, nosebleed, sinus pain Throat: Denies sore throat, hoarseness, dry mouth, sores, thrush Eyes: Denies loss/changes in vision, pain, redness, blurry/double vision, flashing lights Cardiovascular: Denies chest pain/discomfort, tightness,  palpitations, shortness of breath with activity, difficulty lying down, swelling, sudden awakening with shortness of breath Respiratory: Denies shortness of breath, cough, sputum production, wheezing Gastrointestinal: Denies dysphasia, heartburn, change in appetite, nausea, change in bowel habits, rectal bleeding, constipation, diarrhea, yellow skin or eyes Genitourinary: Denies frequency, urgency, burning/pain, blood in urine, incontinence, change in urinary strength. Musculoskeletal: Denies muscle/joint pain, stiffness, back pain, redness or swelling of joints, trauma Skin: Denies rashes, lumps, itching, dryness, color changes, or hair/nail changes Neurological: Denies dizziness, fainting, seizures, weakness, numbness, tingling, tremor Psychiatric - Denies nervousness, stress, depression or memory loss Endocrine: Denies heat or cold intolerance, sweating, frequent urination, excessive thirst, changes in appetite Hematologic: Denies ease of bruising or bleeding    Objective:     BP 110/78 (BP Location: Left Arm, Patient Position: Sitting, Cuff Size: Large)   Pulse (!) 50   Temp 97.9 F (36.6 C) (Oral)   Resp 16   Ht _0  (1.753 m)   Wt 183 lb (83 kg)   SpO2 98%   BMI 27.02 kg/m  Nursing note and vital signs reviewed.   Visual Acuity Screening   Right eye Left eye Both eyes  Without correction:     With correction: _1    Physical Exam  Constitutional: He is oriented to person, place, and time. He appears well-developed and well-nourished.  HENT:  Head: Normocephalic.  Right Ear: Hearing, tympanic membrane, external ear and ear canal normal.  Left Ear: Hearing, tympanic membrane, external ear and ear canal normal.  Nose: Nose normal.  Mouth/Throat: Uvula is midline, oropharynx is clear and moist and mucous membranes are normal.  Eyes: Pupils are equal, round, and reactive to light. Conjunctivae and EOM are normal.  Neck: Neck supple. No JVD present. No tracheal  deviation present. No thyromegaly present.  Cardiovascular: Normal rate, regular rhythm, normal heart sounds and intact distal pulses.   Pulmonary/Chest: Effort normal and breath sounds normal.  Abdominal:  Soft. Bowel sounds are normal. He exhibits no distension and no mass. There is no tenderness. There is no rebound and no guarding.  Musculoskeletal: Normal range of motion. He exhibits no edema or tenderness.  Lymphadenopathy:    He has no cervical adenopathy.  Neurological: He is alert and oriented to person, place, and time. He has normal reflexes. No cranial nerve deficit. He exhibits normal muscle tone. Coordination normal.  Skin: Skin is warm and dry.  Psychiatric: He has a normal mood and affect. His behavior is normal. Judgment and thought content normal.       Assessment & Plan:   During the course of the visit the patient was educated and counseled about appropriate screening and preventive services including:    Pneumococcal vaccine   Influenza vaccine  Prostate cancer screening  Colorectal cancer screening  Diabetes screening  Glaucoma screening  Nutrition counseling   Diet review for nutrition referral? Yes ____  Not Indicated _X___   Patient Instructions (the written plan) was given to the patient.  Medicare Attestation I have personally reviewed: The patient's medical and social history Their use of alcohol, tobacco or illicit drugs Their current medications and supplements The patient's functional ability including ADLs,fall risks, home safety risks, cognitive, and hearing and visual impairment Diet and physical activities Evidence for depression or mood disorders  The patient's weight, height, BMI,  have been recorded in the chart.  I have made referrals, counseling, and provided education to the patient based on review of the above and I have provided the patient with a written personalized care plan for preventive services.     Problem List Items  Addressed This Visit      Cardiovascular and Mediastinum   Essential hypertension    Blood pressure well-controlled and below goal 140/90 with current medication regimen and no adverse side effects or hypotensive readings. Continue current dosage of spironolactone, Avapro, and carvedilol.      Relevant Medications   sildenafil (REVATIO) 20 MG tablet   Other Relevant Orders   CBC   Comprehensive metabolic panel   CAD, NATIVE VESSEL    Coronary artery disease appears stable with no current angina or cardiac symptoms. Continue risk factor management of diabetes, hypertension, and hyperlipidemia.      Relevant Medications   sildenafil (REVATIO) 20 MG tablet     Endocrine   Diabetes mellitus type 2 in obese ([name] [name])    Type 2 diabetes previously well controlled with current medication regimen and no adverse side effects. Blood sugars remained stable. Patient requesting new blood sugar monitoring equipment which has been sent to pharmacy. Continue current dosage of metformin pending A1c results. Obtain urine microalbumin. Diabetic foot exam completed today. Diabetic eye exam is up-to-date per recommendations. Maintained on Avapro and Lipitor for CAD risk reduction. Prevnar updated today. Continue to monitor.      Relevant Orders   Hemoglobin A1c   Urine Microalbumin w/creat. ratio     Other   Hyperlipidemia    Stable with current dosage of atorvastatin and no adverse side effects or myalgias. Continue current dosage of atorvastatin pending lipid profile results.      Relevant Medications   sildenafil (REVATIO) 20 MG tablet   Other Relevant Orders   Lipid panel   Medicare welcome exam - Primary    Reviewed and updated patient's medical, surgical, family and social history. Medications and allergies were also reviewed. Basic screenings for depression, activities of daily living, hearing, cognition and safety were performed. Provider  list was updated and health plan was provided to the  patient.   Prevnar updated today with plan for Pneumovax and 6-12 months. All other immunizations are up-to-date per recommendations. Obtain PSA for prostate cancer screening. Colon cancer screening is up-to-date per recommendations. Diabetic foot exam completed today. Obtain hepatitis C antibody for hepatitis C screening. All other screenings are up-to-date per recommendations.  Overall well exam with risk factors for cardiovascular disease including diabetes, hypertension, and hyperlipidemia as well as coronary artery disease. Chronic conditions appear stable with current medication regimens and no adverse side effects. Encouraged continued physical activity and a nutritional intake that is moderate, balance, and varied. Continue healthy lifestyle behaviors and choices. Follow-up prevention exam in 1 year. Follow-up office visit in 6 months for chronic conditions or sooner pending blood work.       Other Visit Diagnoses    Screening for prostate cancer       Relevant Orders   PSA, Medicare   Need for vaccination with 13-polyvalent pneumococcal conjugate vaccine       Relevant Orders   Pneumococcal conjugate vaccine 13-valent IM (Completed)       I have discontinued Mr. Harbeson ONE TOUCH ULTRA TEST and ONETOUCH DELICA LANCETS FINE. I am also having him start on glucose blood, ONE TOUCH ULTRA MINI, and sildenafil. Additionally, I am having him maintain his multivitamin with minerals, CINNAMON PO, Cyanocobalamin (VITAMIN B 12 PO), Coenzyme Q10 (CO Q 10 PO), nitroGLYCERIN, Fish Oil, atorvastatin, carvedilol, irbesartan, spironolactone, clopidogrel, and metFORMIN.   Meds ordered this encounter  Medications  . glucose blood (ONE TOUCH ULTRA TEST) test strip    Sig: Use one strip per test. Test blood sugars 1-4 times daily as instructed.    Dispense:  25 each    Refill:  5    Substitution permissible per insurance coverage. Dx E11.9.    Order Specific Question:   Supervising Provider     Answer:   Pricilla Holm A [[id number]]  . Blood Glucose Monitoring Suppl (ONE TOUCH ULTRA MINI) w/Device KIT    Sig: Use meter to check blood sugars 1-4 times daily as instructed.    Dispense:  1 each    Refill:  0    Substitution permissible per insurance coverage. Dx E11.9.    Order Specific Question:   Supervising Provider    Answer:   Pricilla Holm A [[id number]]  . sildenafil (REVATIO) 20 MG tablet    Sig: Take 1-5 tablets by mouth daily as needed.    Dispense:  50 tablet    Refill:  0    Order Specific Question:   Supervising Provider    Answer:   Pricilla Holm A [[id number]]     Follow-up: Return in about 1 year (around 01/17/2018), or if symptoms worsen or fail to improve.   Mauricio Po, FNP ;

## 2017-01-19 ENCOUNTER — Telehealth: Payer: Self-pay | Admitting: Family

## 2017-01-19 MED ORDER — GLUCOSE BLOOD VI STRP: ORAL_STRIP | 5 refills | Status: DC

## 2017-01-19 NOTE — Telephone Encounter (Signed)
Pt called stating that [facility] can not supply him with the Glucose blood (ONE TOUCH ULTRA TEST) test strip and that it is covered through Medicare. He asked if it could be resent to [facility] so that he could pick it up there.

## 2017-01-19 NOTE — Telephone Encounter (Signed)
Test strips have been sent to [facility]. Called pt and LVM letting pt know.

## 2017-02-09 NOTE — Progress Notes (Signed)
[name] [name] Date of Birth: 1950-08-08 Medical Record #[id number]  History of Present Illness: [name] [name] is seen for cardiac followup CAD. He has a history of coronary disease diagnosed by cardiac CTA in 2007. In September 2015 he presented with chest pain. Myoview study demonstrated anterior wall ischemia. Subsequent cardiac cath demonstrated severe disease in the mid LAD and first diagonal with 50-70% disease in the OM2. On 04/01/14 he had successful stenting of the LAD and diagonal with DES. FFR of the OM was normal. Despite aggressive pretreatment for dye allergy with steroids, pepcid, and benadryl he still developed hives and diarrhea post procedure.   On follow up today he reports he is doing well. No chest pain or SOB. Does note some dizziness when bending over. He retired in May. Walks one hour/day and is doing some weight lifting and playing golf. Notes some muscle aches when lifting but not at other times.  Current Outpatient Prescriptions on File Prior to Visit  Medication Sig Dispense Refill  . atorvastatin (LIPITOR) 80 MG tablet Take 1 tablet by mouth  daily at 6pm 90 tablet 3  . Blood Glucose Monitoring Suppl (ONE TOUCH ULTRA MINI) w/Device KIT Use meter to check blood sugars 1-4 times daily as instructed. 1 each 0  . carvedilol (COREG) 3.125 MG tablet Take 1 tablet (3.125 mg) by mouth 2 (two) times daily  with a meal. 180 tablet 3  . CINNAMON PO Take 2 tablets by mouth daily.    . clopidogrel (PLAVIX) 75 MG tablet Take 1 tablet (75 mg total) by mouth daily. 90 tablet 3  . Coenzyme Q10 (CO Q 10 PO) Take 1 tablet by mouth daily.    . Cyanocobalamin (VITAMIN B 12 PO) Take 1 tablet by mouth daily.    Marland Kitchen glucose blood (ONE TOUCH ULTRA TEST) test strip Use one strip per test. Test blood sugars 1-4 times daily as instructed. 25 each 5  . irbesartan (AVAPRO) 300 MG tablet Take 1 tablet by mouth  daily 90 tablet 3  . metFORMIN (GLUCOPHAGE) 500 MG tablet Take 1 tablet by mouth  twice a day  with a meal 180 tablet 3  . Multiple Vitamins-Minerals (MULTIVITAMIN WITH MINERALS) tablet Take 1 tablet by mouth daily.    . nitroGLYCERIN (NITROSTAT) 0.4 MG SL tablet Place 1 tablet (0.4 mg total) under the tongue every 5 (five) minutes as needed for chest pain. 25 tablet 3  . Omega-3 Fatty Acids (FISH OIL) 1200 MG CPDR Take 1,200 mg by mouth 2 (two) times daily.    . sildenafil (REVATIO) 20 MG tablet Take 1-5 tablets by mouth daily as needed. 50 tablet 0  . spironolactone (ALDACTONE) 25 MG tablet Take 1 tablet by mouth  daily 90 tablet 3   No current facility-administered medications on file prior to visit.     Allergies  Allergen Reactions  . Aspirin Other (See Comments)    Angioedema  . Contrast Media [Iodinated Diagnostic Agents] Itching and Rash  . Norvasc [Amlodipine Besylate] Other (See Comments)    09/12/2013 gingival hyperplasia , Dr Geralynn Ochs ,DDS    Past Medical History:  Diagnosis Date  . Adenomatous colon polyp 09/07/2004  . Amblyopia   . CAD (coronary artery disease)    cardiac CT with a 50% LAD lesion in 2007  . Diverticulosis   . DM2 (diabetes mellitus, type 2) ([place])   . Elevated LFTs    secondary to labetalol  . Esophageal stricture 2003  . External hemorrhoids 2012  lanced by Dr Rise Patience  . Family history of anesthesia complication    "sister had PONV"  . GERD (gastroesophageal reflux disease)    esophageal stricture, pmh  . Hiatal hernia 01/04/00  . HLD (hyperlipidemia)   . HTN (hypertension)   . Hypopotassemia     Past Surgical History:  Procedure Laterality Date  . BACK SURGERY    . CARDIAC CATHETERIZATION  2007; 03/26/2014  . COLONOSCOPY  2006   tics , ADENOMATOUS polyps. F/U due 2011  . COLONOSCOPY  2012   Tics  . CORONARY ANGIOPLASTY WITH STENT PLACEMENT  04/01/2014   "2"  . ESOPHAGEAL DILATION  2003  . ESOPHAGOGASTRODUODENOSCOPY  2003   ERD, stricture dilated  . EXCISIONAL HEMORRHOIDECTOMY  ~ 2008  . LEFT HEART CATHETERIZATION WITH CORONARY  ANGIOGRAM N/A 03/26/2014   Procedure: LEFT HEART CATHETERIZATION WITH CORONARY ANGIOGRAM;  Surgeon: Valeria Krisko M Martinique, MD;  Location: [name] CATH LAB;  Service: Cardiovascular;  Laterality: N/A;  . PERCUTANEOUS CORONARY STENT INTERVENTION (PCI-S) N/A 04/01/2014   Procedure: PERCUTANEOUS CORONARY STENT INTERVENTION (PCI-S);  Surgeon: Kriss Ishler M Martinique, MD;  Location: [facility] CATH LAB;  Service: Cardiovascular;  Laterality: N/A;  . POSTERIOR LAMINECTOMY / DECOMPRESSION LUMBAR SPINE  12/2009   L4 , Dr Sherwood Gambler    History  Smoking Status  . Never Smoker  Smokeless Tobacco  . Never Used    History  Alcohol Use  . 1.2 oz/week  . 2 Cans of beer per week    Family History  Problem Relation Age of Onset  . Heart attack Mother 59  . Stroke Mother 58  . Prostate cancer Father   . Diabetes Father   . Heart disease Father 62       CBAG   . Diabetes Brother   . Breast cancer Maternal Aunt   . Heart attack Maternal Grandfather 61  . Diabetes Paternal Grandmother   . Stroke Paternal Grandfather 34    Review of Systems: As noted in HPI. All other systems were reviewed and are negative.  Physical Exam: BP 124/79   Pulse (!) 54   Ht 5' 9.5" (1.765 m)   Wt 185 lb 9.6 oz (84.2 kg)   SpO2 99%   BMI 27.02 kg/m   GENERAL:  Well appearing HEENT:  PERRL, EOMI, sclera are clear. Oropharynx is clear. NECK:  No jugular venous distention, carotid upstroke brisk and symmetric, no bruits, no thyromegaly or adenopathy LUNGS:  Clear to auscultation bilaterally CHEST:  Unremarkable HEART:  RRR,  PMI not displaced or sustained,S1 and S2 within normal limits, no S3, no S4: no clicks, no rubs, no murmurs ABD:  Soft, nontender. BS +, no masses or bruits. No hepatomegaly, no splenomegaly EXT:  2 + pulses throughout, no edema, no cyanosis no clubbing SKIN:  Warm and dry.  No rashes NEURO:  Alert and oriented x 3. Cranial nerves II through XII intact. PSYCH:  Cognitively intact    LABORATORY DATA: Lab Results    Component Value Date   WBC 7.7 01/17/2017   HGB 15.3 01/17/2017   HCT 45.8 01/17/2017   PLT 235.0 01/17/2017   GLUCOSE 124 (H) 01/17/2017   CHOL 133 01/17/2017   TRIG 135.0 01/17/2017   HDL 40.80 01/17/2017   LDLCALC 65 01/17/2017   ALT 18 01/17/2017   AST 16 01/17/2017   NA 140 01/17/2017   K 4.6 01/17/2017   CL 107 01/17/2017   CREATININE 0.91 01/17/2017   BUN 16 01/17/2017   CO2 27 01/17/2017  TSH 1.09 03/19/2014   PSA 1.26 01/17/2017   INR 1.0 03/19/2014   HGBA1C 6.7 (H) 01/17/2017   MICROALBUR 0.7 01/17/2017     Assessment / Plan: 1. Coronary disease s/p DES stenting of the LAD and first diagonal in October 2015. Normal FFR of OM2. He is asymptomatic.  Will continue Plavix long term.  Patient is allergic to ASA. Continue risk factor modification.    2. Hyperlipidemia.  On high dose lipitor. Lipids at goal.  3. Diabetes mellitus type 2. Under improved control. Continue dietary modification. A1c 6.7%.   4. Hypertension-controlled.  5. Contrast dye allergy with hives- despite pretreatment.  I will follow up in 6 months.

## 2017-02-13 ENCOUNTER — Encounter: Payer: Self-pay | Admitting: Cardiology

## 2017-02-13 ENCOUNTER — Ambulatory Visit (INDEPENDENT_AMBULATORY_CARE_PROVIDER_SITE_OTHER): Payer: Medicare Other | Admitting: Cardiology

## 2017-02-13 VITALS — BP 124/79 | HR 54 | Ht 69.5 in | Wt 185.6 lb

## 2017-02-13 DIAGNOSIS — I209 Angina pectoris, unspecified: Secondary | ICD-10-CM

## 2017-02-13 DIAGNOSIS — I10 Essential (primary) hypertension: Secondary | ICD-10-CM

## 2017-02-13 DIAGNOSIS — I25118 Atherosclerotic heart disease of native coronary artery with other forms of angina pectoris: Secondary | ICD-10-CM | POA: Diagnosis not present

## 2017-02-13 DIAGNOSIS — E669 Obesity, unspecified: Secondary | ICD-10-CM

## 2017-02-13 DIAGNOSIS — I2510 Atherosclerotic heart disease of native coronary artery without angina pectoris: Secondary | ICD-10-CM

## 2017-02-13 DIAGNOSIS — E1169 Type 2 diabetes mellitus with other specified complication: Secondary | ICD-10-CM | POA: Diagnosis not present

## 2017-02-13 DIAGNOSIS — E7800 Pure hypercholesterolemia, unspecified: Secondary | ICD-10-CM | POA: Diagnosis not present

## 2017-02-13 NOTE — Patient Instructions (Addendum)
Continue your current therapy  I will see you in 6 months.   

## 2017-08-15 NOTE — Progress Notes (Signed)
[name] [name] Date of Birth: [birth year] Medical Record #[id number]  History of Present Illness: [name] [name] is seen for cardiac followup CAD. He has a history of coronary disease diagnosed by cardiac CTA in 2007. In September 2015 he presented with chest pain. Myoview study demonstrated anterior wall ischemia. Subsequent cardiac cath demonstrated severe disease in the mid LAD and first diagonal with 50-70% disease in the OM2. On 04/01/14 he had successful stenting of the LAD and diagonal with DES. FFR of the OM was normal. Despite aggressive pretreatment for dye allergy with steroids, pepcid, and benadryl he still developed hives and diarrhea post procedure.   On follow up today he reports he is doing well. He denies any chest pain or SOB. No palpitations or edema. His exercise has dropped off with the winter weather. He has gained 8 lbs. Doesn't really follow his sugars.   Current Outpatient Medications on File Prior to Visit  Medication Sig Dispense Refill  . atorvastatin (LIPITOR) 80 MG tablet Take 1 tablet by mouth  daily at 6pm 90 tablet 3  . Blood Glucose Monitoring Suppl (ONE TOUCH ULTRA MINI) w/Device KIT Use meter to check blood sugars 1-4 times daily as instructed. 1 each 0  . carvedilol (COREG) 3.125 MG tablet Take 1 tablet (3.125 mg) by mouth 2 (two) times daily  with a meal. 180 tablet 3  . CINNAMON PO Take 2 tablets by mouth daily.    . clopidogrel (PLAVIX) 75 MG tablet Take 1 tablet (75 mg total) by mouth daily. 90 tablet 3  . Coenzyme Q10 (CO Q 10 PO) Take 1 tablet by mouth daily.    . Cyanocobalamin (VITAMIN B 12 PO) Take 1 tablet by mouth daily.    Marland Kitchen glucose blood (ONE TOUCH ULTRA TEST) test strip Use one strip per test. Test blood sugars 1-4 times daily as instructed. 25 each 5  . irbesartan (AVAPRO) 300 MG tablet Take 1 tablet by mouth  daily 90 tablet 3  . metFORMIN (GLUCOPHAGE) 500 MG tablet Take 1 tablet by mouth  twice a day with a meal 180 tablet 3  . Multiple  Vitamins-Minerals (MULTIVITAMIN WITH MINERALS) tablet Take 1 tablet by mouth daily.    . Omega-3 Fatty Acids (FISH OIL) 1200 MG CPDR Take 1,200 mg by mouth 2 (two) times daily.    . sildenafil (REVATIO) 20 MG tablet Take 1-5 tablets by mouth daily as needed. 50 tablet 0  . spironolactone (ALDACTONE) 25 MG tablet Take 1 tablet by mouth  daily 90 tablet 3   No current facility-administered medications on file prior to visit.     Allergies  Allergen Reactions  . Aspirin Other (See Comments)    Angioedema  . Contrast Media [Iodinated Diagnostic Agents] Itching and Rash  . Norvasc [Amlodipine Besylate] Other (See Comments)    09/12/2013 gingival hyperplasia , Dr Geralynn Ochs ,DDS    Past Medical History:  Diagnosis Date  . Adenomatous colon polyp 09/07/2004  . Amblyopia   . CAD (coronary artery disease)    cardiac CT with a 50% LAD lesion in 2007  . Diverticulosis   . DM2 (diabetes mellitus, type 2) ([place])   . Elevated LFTs    secondary to labetalol  . Esophageal stricture 2003  . External hemorrhoids 2012   lanced by Dr Rise Patience  . Family history of anesthesia complication    "sister had PONV"  . GERD (gastroesophageal reflux disease)    esophageal stricture, pmh  . Hiatal hernia 01/04/00  . HLD (  hyperlipidemia)   . HTN (hypertension)   . Hypopotassemia     Past Surgical History:  Procedure Laterality Date  . BACK SURGERY    . CARDIAC CATHETERIZATION  2007; 03/26/2014  . COLONOSCOPY  2006   tics , ADENOMATOUS polyps. F/U due 2011  . COLONOSCOPY  2012   Tics  . CORONARY ANGIOPLASTY WITH STENT PLACEMENT  04/01/2014   "2"  . ESOPHAGEAL DILATION  2003  . ESOPHAGOGASTRODUODENOSCOPY  2003   ERD, stricture dilated  . EXCISIONAL HEMORRHOIDECTOMY  ~ 2008  . LEFT HEART CATHETERIZATION WITH CORONARY ANGIOGRAM N/A 03/26/2014   Procedure: LEFT HEART CATHETERIZATION WITH CORONARY ANGIOGRAM;  Surgeon: Davari Lopes M Martinique, MD;  Location: [facility] C[facility] LAB;  Service: Cardiovascular;  Laterality: N/A;  .  PERCUTANEOUS CORONARY STENT INTERVENTION (PCI-S) N/A 04/01/2014   Procedure: PERCUTANEOUS CORONARY STENT INTERVENTION (PCI-S);  Surgeon: Zacharias Ridling M Martinique, MD;  Location: [facility] CATH LAB;  Service: Cardiovascular;  Laterality: N/A;  . POSTERIOR LAMINECTOMY / DECOMPRESSION LUMBAR SPINE  12/2009   L4 , Dr Sherwood Gambler    Social History   Tobacco Use  Smoking Status Never Smoker  Smokeless Tobacco Never Used    Social History   Substance and Sexual Activity  Alcohol Use Yes  . Alcohol/week: 1.2 oz  . Types: 2 Cans of beer per week    Family History  Problem Relation Age of Onset  . Heart attack Mother 62  . Stroke Mother 64  . Prostate cancer Father   . Diabetes Father   . Heart disease Father 46       CBAG   . Diabetes Brother   . Breast cancer Maternal Aunt   . Heart attack Maternal Grandfather 61  . Diabetes Paternal Grandmother   . Stroke Paternal Grandfather 60    Review of Systems: As noted in HPI. All other systems were reviewed and are negative.  Physical Exam: BP 120/80   Pulse (!) 51   Ht 5' 9.5" (1.765 m)   Wt 193 lb 8 oz (87.8 kg)   BMI 28.17 kg/m   GENERAL:  Well appearing WM in NAD HEENT:  PERRL, EOMI, sclera are clear. Oropharynx is clear. NECK:  No jugular venous distention, carotid upstroke brisk and symmetric, no bruits, no thyromegaly or adenopathy LUNGS:  Clear to auscultation bilaterally CHEST:  Unremarkable HEART:  RRR,  PMI not displaced or sustained,S1 and S2 within normal limits, no S3, no S4: no clicks, no rubs, no murmurs ABD:  Soft, nontender. BS +, no masses or bruits. No hepatomegaly, no splenomegaly EXT:  2 + pulses throughout, no edema, no cyanosis no clubbing SKIN:  Warm and dry.  No rashes NEURO:  Alert and oriented x 3. Cranial nerves II through XII intact. PSYCH:  Cognitively intact      LABORATORY DATA: Lab Results  Component Value Date   WBC 7.7 01/17/2017   HGB 15.3 01/17/2017   HCT 45.8 01/17/2017   PLT 235.0 01/17/2017    GLUCOSE 124 (H) 01/17/2017   CHOL 133 01/17/2017   TRIG 135.0 01/17/2017   HDL 40.80 01/17/2017   LDLCALC 65 01/17/2017   ALT 18 01/17/2017   AST 16 01/17/2017   NA 140 01/17/2017   K 4.6 01/17/2017   CL 107 01/17/2017   CREATININE 0.91 01/17/2017   BUN 16 01/17/2017   CO2 27 01/17/2017   TSH 1.09 03/19/2014   PSA 1.26 01/17/2017   INR 1.0 03/19/2014   HGBA1C 6.7 (H) 01/17/2017   MICROALBUR 0.7 01/17/2017  Ecg today shows NSR with normal Ecg. I have personally reviewed and interpreted this study.   Assessment / Plan: 1. Coronary disease s/p DES stenting of the LAD and first diagonal in October 2015. Normal FFR of OM2. He is asymptomatic.  Will continue Plavix long term.  Patient is allergic to ASA. Continue risk factor modification.  Encourage weight loss and increased aerobic activity.  2. Hyperlipidemia.  On high dose lipitor. Lipids at goal.  3. Diabetes mellitus type 2. On metformin. Followed by primary care.  Continue dietary modification. A1c 6.7%.   4. Hypertension-controlled.  5. Contrast dye allergy with hives- despite pretreatment.  I will follow up in 6 months.

## 2017-08-17 ENCOUNTER — Encounter: Payer: Self-pay | Admitting: Cardiology

## 2017-08-17 ENCOUNTER — Ambulatory Visit (INDEPENDENT_AMBULATORY_CARE_PROVIDER_SITE_OTHER): Payer: Medicare Other | Admitting: Cardiology

## 2017-08-17 VITALS — BP 120/80 | HR 51 | Ht 69.5 in | Wt 193.5 lb

## 2017-08-17 DIAGNOSIS — E7800 Pure hypercholesterolemia, unspecified: Secondary | ICD-10-CM

## 2017-08-17 DIAGNOSIS — E669 Obesity, unspecified: Secondary | ICD-10-CM | POA: Diagnosis not present

## 2017-08-17 DIAGNOSIS — I25118 Atherosclerotic heart disease of native coronary artery with other forms of angina pectoris: Secondary | ICD-10-CM | POA: Diagnosis not present

## 2017-08-17 DIAGNOSIS — E1169 Type 2 diabetes mellitus with other specified complication: Secondary | ICD-10-CM | POA: Diagnosis not present

## 2017-08-17 DIAGNOSIS — I10 Essential (primary) hypertension: Secondary | ICD-10-CM

## 2017-08-17 MED ORDER — NITROGLYCERIN 0.4 MG SL SUBL: 0.4000 mg | SUBLINGUAL_TABLET | SUBLINGUAL | 11 refills | Status: DC | PRN

## 2017-08-17 NOTE — Patient Instructions (Signed)
Continue your current therapy  I will see you in 6 months.   

## 2017-08-24 ENCOUNTER — Other Ambulatory Visit: Payer: Self-pay | Admitting: Cardiology

## 2017-08-24 MED ORDER — NITROGLYCERIN 0.4 MG SL SUBL: 0.4000 mg | SUBLINGUAL_TABLET | SUBLINGUAL | 1 refills | Status: AC | PRN

## 2017-08-24 NOTE — Telephone Encounter (Signed)
Called back and held for 5 minutes before being sent to their voicemail. Left message for them to contact office for clarification of Nitrostat.  [email or web address]

## 2017-08-24 NOTE — Telephone Encounter (Signed)
Pt c/o medication issue:  1. Name of Medication: nitroGLYCERIN (NITROSTAT) 0.4 MG SL tablet 2. How are you currently taking this medication (dosage and times per day)? Place 1 tablet (0.4 mg total) under the tongue every 5 (five) minutes as needed for chest pain.  3. Are you having a reaction (difficulty breathing--STAT)? no  4. What is your medication issue?  Need calcification this medication come in 125 or 100 count bottles for a 90 day   Fax  [phone]

## 2017-08-24 NOTE — Telephone Encounter (Signed)
Refill sent to the pharmacy electronically.  

## 2017-08-25 NOTE — Telephone Encounter (Signed)
Spoke with [facility] to clarify that patient can have Rx for NTG #100 for 90 day supply.

## 2017-09-27 ENCOUNTER — Telehealth: Payer: Self-pay | Admitting: Cardiology

## 2017-09-27 MED ORDER — IRBESARTAN 150 MG PO TABS: 300.0000 mg | ORAL_TABLET | Freq: Every day | ORAL | 1 refills | Status: DC

## 2017-09-27 NOTE — Telephone Encounter (Signed)
Returned the call to the patient. He recently could not get his Irbesartan refilled to due a shortage, He found Irbesartan 150 mg at a local [facility]. A prescription was sent in but now requires a prior authorization for the 150 mg (2 tablets daily=300 mg). Message will be routed to the provider's nurse for her knowledge.

## 2017-09-27 NOTE — Telephone Encounter (Signed)
Pt c/o medication issue:  1. Name of Medication: Irbesartan   2. How are you currently taking this medication (dosage and times per day)? 150 mg// 2x daily   3. Are you having a reaction (difficulty breathing--STAT)? no  4. What is your medication issue? Patient calling back, states that he called previously for a refill and now he has a problem with the medication

## 2017-09-27 NOTE — Telephone Encounter (Signed)
New Message   .Pt c/o medication issue:  1. Name of Medication: irbesartan (AVAPRO) 300 MG tablet    2. How are you currently taking this medication (dosage and times per day)? 300 mg 1 tablet daily  3. Are you having a reaction (difficulty breathing--STAT)?   4. What is your medication issue? Patient states that he is not able to locate a pharmacy that has this medication. He indicates that he was told that it was on a recall. Patient would like another prescription to replace this. Please call to discuss.

## 2017-09-27 NOTE — Telephone Encounter (Signed)
The patient called back requesting that Irbesartan 150 mg (two tablets daily= 300 mg) be called in to [facility] at [place] since his current pharmacy is out of the medication.

## 2017-09-27 NOTE — Telephone Encounter (Signed)
The patient stated that he found a pharmacy that has Irbesartan 150 mg tablets. He could not remember the pharmacy. He will call back with this information.

## 2017-09-28 NOTE — Telephone Encounter (Signed)
Prior Auth submitted for Irbesartan 150 mg twice a day.Key # [id number].

## 2017-09-29 ENCOUNTER — Telehealth: Payer: Self-pay | Admitting: Cardiology

## 2017-09-29 NOTE — Telephone Encounter (Signed)
Received call from Debroah Baller with [facility] calling to verify patient's diagnosis.Irbesartan 150 mg still under review.

## 2017-10-02 NOTE — Telephone Encounter (Signed)
Received notice from [facility] Irbesartan 150 mg approved.Patient stated pharmacy already notified him.

## 2017-11-10 ENCOUNTER — Encounter: Payer: Self-pay | Admitting: Family Medicine

## 2017-11-10 ENCOUNTER — Ambulatory Visit (INDEPENDENT_AMBULATORY_CARE_PROVIDER_SITE_OTHER): Payer: Medicare Other | Admitting: Family Medicine

## 2017-11-10 ENCOUNTER — Telehealth: Payer: Self-pay | Admitting: Family Medicine

## 2017-11-10 VITALS — BP 110/80 | HR 57 | Temp 98.4°F | Resp 16 | Ht 69.5 in | Wt 193.1 lb

## 2017-11-10 DIAGNOSIS — E785 Hyperlipidemia, unspecified: Secondary | ICD-10-CM | POA: Diagnosis not present

## 2017-11-10 DIAGNOSIS — I10 Essential (primary) hypertension: Secondary | ICD-10-CM

## 2017-11-10 DIAGNOSIS — G8929 Other chronic pain: Secondary | ICD-10-CM | POA: Diagnosis not present

## 2017-11-10 DIAGNOSIS — E669 Obesity, unspecified: Secondary | ICD-10-CM | POA: Diagnosis not present

## 2017-11-10 DIAGNOSIS — M25561 Pain in right knee: Secondary | ICD-10-CM | POA: Diagnosis not present

## 2017-11-10 DIAGNOSIS — E1159 Type 2 diabetes mellitus with other circulatory complications: Secondary | ICD-10-CM | POA: Diagnosis not present

## 2017-11-10 DIAGNOSIS — I152 Hypertension secondary to endocrine disorders: Secondary | ICD-10-CM

## 2017-11-10 DIAGNOSIS — M25562 Pain in left knee: Secondary | ICD-10-CM | POA: Diagnosis not present

## 2017-11-10 DIAGNOSIS — E1169 Type 2 diabetes mellitus with other specified complication: Secondary | ICD-10-CM | POA: Diagnosis not present

## 2017-11-10 DIAGNOSIS — B351 Tinea unguium: Secondary | ICD-10-CM

## 2017-11-10 DIAGNOSIS — M67911 Unspecified disorder of synovium and tendon, right shoulder: Secondary | ICD-10-CM

## 2017-11-10 NOTE — Assessment & Plan Note (Signed)
Stable on metformin 500 mg twice daily.  Patient will follow-up in about 3 months for A1c testing.  He will be getting his eye exam soon.  Foot exam was performed today.

## 2017-11-10 NOTE — Telephone Encounter (Signed)
MEDICATION: irbesartan (AVAPRO) 300 MG tablet  PHARMACY: [facility] [employer], [address]   IS THIS A 90 DAY SUPPLY : yes  IS PATIENT OUT OF MEDICATION: no  IF NOT; HOW MUCH IS LEFT: 90 days- pt states prescription expires in 90 days   LAST APPOINTMENT DATE: @5 /17/2019  NEXT APPOINTMENT DATE:@8 /12/2017  OTHER COMMENTS:   **Let patient know to contact pharmacy at the end of the day to make sure medication is ready. **  ** Please notify patient to allow 48-72 hours to process**  **Encourage patient to contact the pharmacy for refills or they can request refills through [facility]**

## 2017-11-10 NOTE — Progress Notes (Signed)
Subjective:  [name] [name] is a 67 y.o. male who presents today with a chief complaint of shoulder pain and to transfer care.   HPI:  Shoulder Pain, chronic problem Started a few weeks ago. Injured while starting a blower. Located in right shoulder. Hurts with some movement.  No specific treatments tried.  Pain is improving.  T2DM, Chronic problem Stable on metformin 500mg  twice daily.  Tolerating well without side effects.  HTN, chronic problem Currently on Coreg 3.125 mg twice daily, irbesartan 300 mg daily, and spinal lactone 25 mg daily.  Tolerates these all well without side effects.  No reported chest pain or shortness of breath.  HLD, chronic problem On atorvastatin 80 mg daily.  Occasionally has myalgias while working out.  Toenail Fungus, new problem Located in bilateral great toes.  Has tried over-the-counter remedies which have not worked.  Symptoms seem to be stable.  Knee pain, chronic problem Severe history.  Located in bilateral knees however right is worse than left.  No obvious precipitating events.  Worse earlier today, then improves throughout the day.  No reported fevers or chills.  ROS: Positive for fatigue, difficulty swallowing, back pain, neck pain, neck stiffness, seasonal allergies, bruising, and per HPI, otherwise 10 point review of systems was negative.   [place]: Family history significant for heart attack and stroke in his mother. He reports that he has never smoked. He has never used smokeless tobacco. He reports that he drinks about 1.2 oz of alcohol per week. He reports that he does not use drugs.  Objective:  Physical Exam: BP 110/80 (BP Location: Left Arm, Patient Position: Sitting, Cuff Size: Normal)   Pulse (!) 57   Temp 98.4 F (36.9 C) (Oral)   Resp 16   Ht 5' 9.5" (1.765 m)   Wt 193 lb 2 oz (87.6 kg)   SpO2 97%   BMI 28.11 kg/m   Gen: NAD, resting comfortably CV: RRR with no murmurs appreciated Pulm: NWOB, CTAB with no crackles,  wheezes, or rhonchi GI: Normal bowel sounds present. Soft, Nontender, Nondistended. MSK:  -Shoulders: No deformities.  Mildly tender to palpation along superior aspect of right shoulder.  Supra spinatus testing intact bilaterally, however pain elicited with resistance in right shoulder.  Internal and external rotation 5 out of 5 bilaterally. -Knees: No deformities.  Crepitus noted with active range of motion. Skin: Warm, dry Neuro: Grossly normal, moves all extremities Psych: Normal affect and thought content  Assessment/Plan:  Diabetes mellitus type 2 in obese Stable on metformin 500 mg twice daily.  Patient will follow-up in about 3 months for A1c testing.  He will be getting his eye exam soon.  Foot exam was performed today.  Hypertension associated with diabetes ([place]) Stable. Continue current medications.    Hyperlipidemia associated with type 2 diabetes mellitus (HCC) Stable.  Continue Lipitor 80 mg daily.  Also advised patient to try co-Q10 at dose of 200-250 mg daily to see if this helps his myalgias.  He will need repeat lipid panel with next blood draw.  Rotator cuff tendinopathy No signs of tear.  Discussed conservative measures.  Home exercise program given.  Will avoid NSAIDs given his cardiac history.  Return precautions reviewed.  Consider referral to sports medicine and/or physical therapy if no improvement.  Bilateral knee pain Likely secondary to mild osteoarthritis.  Discussed conservative measures.  Home exercise program given.  As noted above, will avoid NSAIDs given his cardiac history.  Onychomycosis Will place referral to  podiatry.  Preventative healthcare Patient will follow-up in about 3 months for blood work.  Will need CBC, CMET, A1c, lipid panel, and hep C antibody.  Also need pneumonia shot.  Time Spent: I spent >40 minutes face-to-face with the patient, with more than half spent on counseling for treatment for his rotator cuff tendinopathy, dyslipidemia,  hypertension, diabetes, bilateral knee pain, onychomycosis, and preventive health care.Algis Greenhouse. [name], MD 11/10/2017 10:15 AM

## 2017-11-10 NOTE — Assessment & Plan Note (Signed)
Stable.  Continue Lipitor 80 mg daily.  Also advised patient to try co-Q10 at dose of 200-250 mg daily to see if this helps his myalgias.  He will need repeat lipid panel with next blood draw.

## 2017-11-10 NOTE — Assessment & Plan Note (Signed)
Stable.  Continue current medications.

## 2017-11-10 NOTE — Patient Instructions (Addendum)
It was very nice to meet you today!  Please work on the exercises for your knee and shoulder.  I will place a referral to podiatry.  Please come back in 3-6 months for blood work and a pneumonia shot, or sooner as needed.   Take care, Dr [name]

## 2017-11-13 ENCOUNTER — Other Ambulatory Visit: Payer: Self-pay

## 2017-11-13 MED ORDER — IRBESARTAN 300 MG PO TABS: 300.0000 mg | ORAL_TABLET | Freq: Every day | ORAL | 0 refills | Status: DC

## 2017-11-13 NOTE — Telephone Encounter (Signed)
Sent in meds to [facility]

## 2017-11-27 DIAGNOSIS — H0100A Unspecified blepharitis right eye, upper and lower eyelids: Secondary | ICD-10-CM | POA: Diagnosis not present

## 2017-11-27 DIAGNOSIS — H18413 Arcus senilis, bilateral: Secondary | ICD-10-CM | POA: Diagnosis not present

## 2017-11-27 DIAGNOSIS — H04123 Dry eye syndrome of bilateral lacrimal glands: Secondary | ICD-10-CM | POA: Diagnosis not present

## 2017-11-27 DIAGNOSIS — H52223 Regular astigmatism, bilateral: Secondary | ICD-10-CM | POA: Diagnosis not present

## 2017-11-27 DIAGNOSIS — Z7984 Long term (current) use of oral hypoglycemic drugs: Secondary | ICD-10-CM | POA: Diagnosis not present

## 2017-11-27 DIAGNOSIS — H11153 Pinguecula, bilateral: Secondary | ICD-10-CM | POA: Diagnosis not present

## 2017-11-27 DIAGNOSIS — H25043 Posterior subcapsular polar age-related cataract, bilateral: Secondary | ICD-10-CM | POA: Diagnosis not present

## 2017-11-27 DIAGNOSIS — H40013 Open angle with borderline findings, low risk, bilateral: Secondary | ICD-10-CM | POA: Diagnosis not present

## 2017-11-27 DIAGNOSIS — H5213 Myopia, bilateral: Secondary | ICD-10-CM | POA: Diagnosis not present

## 2017-11-27 DIAGNOSIS — H0100B Unspecified blepharitis left eye, upper and lower eyelids: Secondary | ICD-10-CM | POA: Diagnosis not present

## 2017-11-27 DIAGNOSIS — H2513 Age-related nuclear cataract, bilateral: Secondary | ICD-10-CM | POA: Diagnosis not present

## 2017-11-27 DIAGNOSIS — H25013 Cortical age-related cataract, bilateral: Secondary | ICD-10-CM | POA: Diagnosis not present

## 2017-12-18 ENCOUNTER — Telehealth: Payer: Self-pay | Admitting: Family Medicine

## 2017-12-18 ENCOUNTER — Other Ambulatory Visit: Payer: Self-pay

## 2017-12-18 DIAGNOSIS — M25561 Pain in right knee: Secondary | ICD-10-CM

## 2017-12-18 NOTE — Telephone Encounter (Signed)
Referral has been placed. 

## 2017-12-18 NOTE — Telephone Encounter (Signed)
See note.   Copied from [place] [id number]. Topic: Referral - Request >> Dec 18, 2017  4:10 PM Selinda Flavin B, [place] wrote: Reason for CRM: Patient is requesting a referral for physical therapy for right knee. Dr Leo Grosser at [facility] (fax#: [phone]). Would like a call once this referral has been placed. Please advise. CB#: [phone]

## 2017-12-18 NOTE — Telephone Encounter (Signed)
Ok with me. Please place any necessary orders. 

## 2017-12-18 NOTE — Telephone Encounter (Signed)
Ok to place referral.

## 2017-12-20 ENCOUNTER — Ambulatory Visit (INDEPENDENT_AMBULATORY_CARE_PROVIDER_SITE_OTHER): Payer: Medicare Other | Admitting: Podiatry

## 2017-12-20 ENCOUNTER — Encounter: Payer: Self-pay | Admitting: Podiatry

## 2017-12-20 VITALS — BP 131/83 | HR 60

## 2017-12-20 DIAGNOSIS — M79674 Pain in right toe(s): Secondary | ICD-10-CM

## 2017-12-20 DIAGNOSIS — E119 Type 2 diabetes mellitus without complications: Secondary | ICD-10-CM

## 2017-12-20 DIAGNOSIS — I25118 Atherosclerotic heart disease of native coronary artery with other forms of angina pectoris: Secondary | ICD-10-CM | POA: Diagnosis not present

## 2017-12-20 DIAGNOSIS — B351 Tinea unguium: Secondary | ICD-10-CM

## 2017-12-20 DIAGNOSIS — M79675 Pain in left toe(s): Secondary | ICD-10-CM | POA: Diagnosis not present

## 2017-12-20 NOTE — Progress Notes (Signed)
This patient presents to the office with chief complaint of long thick nails and diabetic feet.  This patient  says there  is  no pain and discomfort in their feet.  This patient says there are long thick painful nails.  These nails are painful walking and wearing shoes.  Patient has no history of infection or drainage from both feet.  Patient is unable to  self treat his own nails . This patient presents  to the office today for treatment of the  long nails and a foot evaluation due to history of  diabetes.  General Appearance  Alert, conversant and in no acute stress.  Vascular  Dorsalis pedis and posterior tibial  pulses are palpable  bilaterally.  Capillary return is within normal limits  bilaterally. Temperature is within normal limits  bilaterally.  Neurologic  Senn-Weinstein monofilament wire test within normal limits  bilaterally. Muscle power within normal limits bilaterally.  Nails Thick disfigured discolored nails with subungual debris  from hallux bilaterally. No evidence of bacterial infection or drainage bilaterally.  Orthopedic  No limitations of motion of motion feet .  No crepitus or effusions noted. HAV  B/L  Skin  normotropic skin with no porokeratosis noted bilaterally.  No signs of infections or ulcers noted.     Onychomycosis  Diabetes with no foot complications  IE  Debride nails x 2.  A diabetic foot exam was performed and there is no evidence of any vascular or neurologic pathology.   RTC 3 months.   Gardiner Barefoot DPM

## 2018-01-02 DIAGNOSIS — M25561 Pain in right knee: Secondary | ICD-10-CM | POA: Diagnosis not present

## 2018-01-08 ENCOUNTER — Other Ambulatory Visit: Payer: Self-pay | Admitting: Cardiology

## 2018-01-08 MED ORDER — CARVEDILOL 3.125 MG PO TABS: ORAL_TABLET | ORAL | 3 refills | Status: DC

## 2018-01-08 MED ORDER — IRBESARTAN 300 MG PO TABS: 300.0000 mg | ORAL_TABLET | Freq: Every day | ORAL | 3 refills | Status: DC

## 2018-01-08 MED ORDER — CLOPIDOGREL BISULFATE 75 MG PO TABS: 75.0000 mg | ORAL_TABLET | Freq: Every day | ORAL | 3 refills | Status: DC

## 2018-01-08 MED ORDER — SPIRONOLACTONE 25 MG PO TABS: ORAL_TABLET | ORAL | 3 refills | Status: DC

## 2018-01-08 NOTE — Telephone Encounter (Signed)
New message   !!PATIENT IS REQUESTING PHONE CALL TO CONFIRM MEDICATION SENT TO [facility]    1. Which medications need to be refilled? (please list name of each medication and dose if known) spironolactone (ALDACTONE) 25 MG tablet, clopidogrel (PLAVIX) 75 MG tablet, irbesartan (AVAPRO) 300 MG tablet and carvedilol (COREG) 3.125 MG tablet  2. Which pharmacy/location (including street and city if local pharmacy) is medication to be sent to? [facility] - [place], [place]  3. Do they need a 30 day or 90 day supply? [place]

## 2018-01-08 NOTE — Telephone Encounter (Signed)
Rx(s) sent to pharmacy electronically. Patient notified. 

## 2018-01-10 ENCOUNTER — Encounter: Payer: Self-pay | Admitting: Podiatry

## 2018-01-10 ENCOUNTER — Other Ambulatory Visit: Payer: Self-pay

## 2018-01-10 ENCOUNTER — Ambulatory Visit (INDEPENDENT_AMBULATORY_CARE_PROVIDER_SITE_OTHER): Payer: Medicare Other | Admitting: Podiatry

## 2018-01-10 DIAGNOSIS — L03032 Cellulitis of left toe: Secondary | ICD-10-CM | POA: Diagnosis not present

## 2018-01-10 DIAGNOSIS — M79674 Pain in right toe(s): Secondary | ICD-10-CM

## 2018-01-10 DIAGNOSIS — L608 Other nail disorders: Secondary | ICD-10-CM

## 2018-01-10 DIAGNOSIS — B351 Tinea unguium: Secondary | ICD-10-CM

## 2018-01-10 DIAGNOSIS — M25561 Pain in right knee: Secondary | ICD-10-CM | POA: Diagnosis not present

## 2018-01-10 DIAGNOSIS — E119 Type 2 diabetes mellitus without complications: Secondary | ICD-10-CM | POA: Diagnosis not present

## 2018-01-10 DIAGNOSIS — M79675 Pain in left toe(s): Secondary | ICD-10-CM

## 2018-01-10 MED ORDER — CEPHALEXIN 500 MG PO CAPS: 500.0000 mg | ORAL_CAPSULE | Freq: Two times a day (BID) | ORAL | 0 refills | Status: DC

## 2018-01-10 MED ORDER — ATORVASTATIN CALCIUM 80 MG PO TABS: ORAL_TABLET | ORAL | 3 refills | Status: DC

## 2018-01-10 NOTE — Progress Notes (Signed)
This patient presents to the office with chief complaint of a painful big toe left foot.  He says the outside border of the left big toenail has become red, swollen and painful.  He says it has started to worsen over the last week.  He was seen by myself on 12/20/2017 with no evidence of any infection.  He now presents the office with pain and discomfort walking and wearing his shoes.  He denies any drainage or pus at the site of the ingrown nail.  He presents the office today for an evaluation and treatment of this painful infected ingrown toenail.  General Appearance  Alert, conversant and in no acute stress.  Vascular  Dorsalis pedis and posterior tibial  pulses are palpable  bilaterally.  Capillary return is within normal limits  bilaterally. Temperature is within normal limits  bilaterally.  Neurologic  Senn-Weinstein monofilament wire test within normal limits  bilaterally. Muscle power within normal limits bilaterally.  Nails Thick disfigured discolored nails with subungual debris  Hallux left foot.  Patient has paronychia lateral border left great toenail.  Marked nail incurvation lateral border left great toenail.. No evidence of bacterial infection or drainage bilaterally.  Orthopedic  No limitations of motion of motion feet .  No crepitus or effusions noted.  No bony pathology or digital deformities noted.  Skin  normotropic skin with no porokeratosis noted bilaterally.  No signs of infections or ulcers noted.    Paronychia lateral border left great toe.  ROV.  Under local anesthesia of 3 mL of 2% Xylocaine plain the distal lateral border left great toenail.  The distal nail border was removed and the surgical site was bandaged with Neosporinand dry sterile dressing.  We discussed permanent correction for the incurvated, mycotic nail, but due to the fact this patient takes Plavix we held up on the permanent surgery. Home instructions for soaks were given to this patient.  Rtc prn.    Prescribed cephaexin.  Patient to call the office if problem occurs or he desires to proceed with permanent nail surgery.    Gardiner Barefoot DPM

## 2018-01-12 DIAGNOSIS — M25561 Pain in right knee: Secondary | ICD-10-CM | POA: Diagnosis not present

## 2018-01-17 DIAGNOSIS — M25561 Pain in right knee: Secondary | ICD-10-CM | POA: Diagnosis not present

## 2018-01-19 DIAGNOSIS — M25561 Pain in right knee: Secondary | ICD-10-CM | POA: Diagnosis not present

## 2018-01-23 DIAGNOSIS — M25561 Pain in right knee: Secondary | ICD-10-CM | POA: Diagnosis not present

## 2018-01-30 DIAGNOSIS — M25561 Pain in right knee: Secondary | ICD-10-CM | POA: Diagnosis not present

## 2018-01-31 ENCOUNTER — Ambulatory Visit (INDEPENDENT_AMBULATORY_CARE_PROVIDER_SITE_OTHER): Payer: Medicare Other

## 2018-01-31 ENCOUNTER — Encounter: Payer: Self-pay | Admitting: Family Medicine

## 2018-01-31 ENCOUNTER — Ambulatory Visit (INDEPENDENT_AMBULATORY_CARE_PROVIDER_SITE_OTHER): Payer: Medicare Other | Admitting: Family Medicine

## 2018-01-31 ENCOUNTER — Ambulatory Visit: Payer: Medicare Other | Admitting: *Deleted

## 2018-01-31 VITALS — BP 134/82 | HR 54 | Temp 97.8°F | Ht 70.0 in | Wt 194.0 lb

## 2018-01-31 VITALS — BP 134/82 | HR 54 | Temp 97.8°F | Ht 70.0 in | Wt 194.4 lb

## 2018-01-31 DIAGNOSIS — Z1159 Encounter for screening for other viral diseases: Secondary | ICD-10-CM | POA: Diagnosis not present

## 2018-01-31 DIAGNOSIS — I152 Hypertension secondary to endocrine disorders: Secondary | ICD-10-CM

## 2018-01-31 DIAGNOSIS — E1165 Type 2 diabetes mellitus with hyperglycemia: Secondary | ICD-10-CM

## 2018-01-31 DIAGNOSIS — I10 Essential (primary) hypertension: Secondary | ICD-10-CM | POA: Diagnosis not present

## 2018-01-31 DIAGNOSIS — E785 Hyperlipidemia, unspecified: Secondary | ICD-10-CM

## 2018-01-31 DIAGNOSIS — Z23 Encounter for immunization: Secondary | ICD-10-CM | POA: Diagnosis not present

## 2018-01-31 DIAGNOSIS — E1169 Type 2 diabetes mellitus with other specified complication: Secondary | ICD-10-CM

## 2018-01-31 DIAGNOSIS — E1159 Type 2 diabetes mellitus with other circulatory complications: Secondary | ICD-10-CM

## 2018-01-31 DIAGNOSIS — M25561 Pain in right knee: Secondary | ICD-10-CM | POA: Diagnosis not present

## 2018-01-31 DIAGNOSIS — E669 Obesity, unspecified: Secondary | ICD-10-CM | POA: Diagnosis not present

## 2018-01-31 DIAGNOSIS — M67911 Unspecified disorder of synovium and tendon, right shoulder: Secondary | ICD-10-CM | POA: Diagnosis not present

## 2018-01-31 DIAGNOSIS — Z0000 Encounter for general adult medical examination without abnormal findings: Secondary | ICD-10-CM

## 2018-01-31 LAB — CBC
HCT: 43.9 % (ref 39.0–52.0)
HEMOGLOBIN: 14.9 g/dL (ref 13.0–17.0)
MCHC: 34 g/dL (ref 30.0–36.0)
MCV: 99.5 fl (ref 78.0–100.0)
PLATELETS: 200 10*3/uL (ref 150.0–400.0)
RBC: 4.41 Mil/uL (ref 4.22–5.81)
RDW: 13.2 % (ref 11.5–15.5)
WBC: 7.3 10*3/uL (ref 4.0–10.5)

## 2018-01-31 LAB — LIPID PANEL
CHOL/HDL RATIO: 3
CHOLESTEROL: 134 mg/dL (ref 0–200)
HDL: 42.5 mg/dL (ref >39.00)
LDL Cholesterol: 65 mg/dL (ref 0–99)
NonHDL: 91.57
TRIGLYCERIDES: 134 mg/dL (ref 0.0–149.0)
VLDL: 26.8 mg/dL (ref 0.0–40.0)

## 2018-01-31 LAB — COMPREHENSIVE METABOLIC PANEL
ALBUMIN: 4.3 g/dL (ref 3.5–5.2)
ALK PHOS: 68 U/L (ref 39–117)
ALT: 19 U/L (ref 0–53)
AST: 14 U/L (ref 0–37)
BILIRUBIN TOTAL: 0.8 mg/dL (ref 0.2–1.2)
BUN: 15 mg/dL (ref 6–23)
CALCIUM: 10.2 mg/dL (ref 8.4–10.5)
CO2: 30 meq/L (ref 19–32)
Chloride: 104 mEq/L (ref 96–112)
Creatinine, Ser: 0.98 mg/dL (ref 0.40–1.50)
GFR: 80.84 mL/min (ref >60.00)
Glucose, Bld: 134 mg/dL — ABNORMAL HIGH (ref 70–99)
Potassium: 4.6 mEq/L (ref 3.5–5.1)
Sodium: 140 mEq/L (ref 135–145)
Total Protein: 6.6 g/dL (ref 6.0–8.3)

## 2018-01-31 LAB — POCT GLYCOSYLATED HEMOGLOBIN (HGB A1C): Hemoglobin A1C: 6.5 % — AB (ref 4.0–5.6)

## 2018-01-31 NOTE — Progress Notes (Signed)
   Subjective:  [name] [name] is a 67 y.o. male who presents today with a chief complaint of T2DM.   HPI:  T2DM, chronic problem, stable Currently on metformin 500mg  bid. Tolerating well without side effects.   HTN, chronic problem, stable Currently on Coreg 3.125 mg twice daily, irbesartan 300 mg daily, spironolactone 25 mg daily.  Tolerates all these well without side effects..  HLD, chronic problem, stable Currently on lipitor 40mg  daily. Tolerating well without reported side effects.   Shoulder Pain, chronic problem, stable Improved since last visit.   Knee Pain, chronic problem, stable Going to physical therapy at [facility]. Pain is much better compared to his last visit.   ROS: Per HPI  PMH: He reports that he has never smoked. He has never used smokeless tobacco. He reports that he drinks about 1.2 oz of alcohol per week. He reports that he does not use drugs.  Objective:  Physical Exam: BP 134/82 (BP Location: Left Arm, Patient Position: Sitting, Cuff Size: Normal)   Pulse (!) 54   Temp 97.8 F (36.6 C) (Oral)   Ht 5\' 10"  (1.778 m)   Wt 194 lb 6.4 oz (88.2 kg)   SpO2 97%   BMI 27.89 kg/m   Wt Readings from Last 3 Encounters:  01/31/18 194 lb 6.4 oz (88.2 kg)  11/10/17 193 lb 2 oz (87.6 kg)  08/17/17 193 lb 8 oz (87.8 kg)  Gen: NAD, resting comfortably CV: RRR with no murmurs appreciated Pulm: NWOB, CTAB with no crackles, wheezes, or rhonchi  Results for orders placed or performed in visit on 01/31/18 (from the past 24 hour(s))  POCT glycosylated hemoglobin (Hb A1C)     Status: Abnormal   Collection Time: 01/31/18  9:23 AM  Result Value Ref Range   Hemoglobin A1C 6.5 (A) 4.0 - 5.6 %   HbA1c POC (<> result, manual entry)  4.0 - 5.6 %   HbA1c, POC (prediabetic range)  5.7 - 6.4 %   HbA1c, POC (controlled diabetic range)  0.0 - 7.0 %     Assessment/Plan:  Hypertension associated with diabetes (HCC) At goal.  Continue Coreg 3.125 mg twice daily, irbesartan 300  mg daily, and spironolactone 25 mg daily.  Check CMET CBC today.  Hyperlipidemia associated with type 2 diabetes mellitus ([place]) Check lipid panel today.  Continue Lipitor 80 mg daily.  T2DM (type 2 diabetes mellitus) (HCC) A1c stable at 6.5.  Continue metformin 500 mg twice daily.  Follow-up in 6 months.  Shoulder pain Improving with physical therapy.  Knee pain Improved with physical therapy.  Preventative healthcare Check hepatitis C antibody today.  We will give final pneumonia vaccine today as well.  Will have annual wellness visit later today.  Algis Greenhouse. [name], MD 01/31/2018 9:57 AM

## 2018-01-31 NOTE — Progress Notes (Signed)
PCP notes:From OV 11/10/17: Please work on the exercises for your knee and shoulder.  I will place a referral to podiatry.  Please come back in 3-6 months for blood work and a pneumonia shot, or sooner as needed.      Health maintenance:Up to Date   Abnormal screenings: None   Patient concerns: None. PHQ not done in this visit as it was done in visit with Dr. [name] prior to seeing me   Nurse concerns:None   Next PCP appt: Scheduled as advised

## 2018-01-31 NOTE — Assessment & Plan Note (Signed)
A1c stable at 6.5.  Continue metformin 500 mg twice daily.  Follow-up in 6 months.

## 2018-01-31 NOTE — Assessment & Plan Note (Signed)
At goal.  Continue Coreg 3.125 mg twice daily, irbesartan 300 mg daily, and spironolactone 25 mg daily.  Check CMET CBC today.

## 2018-01-31 NOTE — Progress Notes (Signed)
Subjective:   [name] [name] is a 67 y.o. male who presents for Medicare Annual/Subsequent preventive examination.  Review of Systems:  No ROS.  Medicare Wellness Visit. Additional risk factors are reflected in the social history. Cardiac Risk Factors include: advanced age (>55men, >37 women);male gender Patient is single and lives in a single story home. Currently no pets. Brother and sister live near by and they are close.    Patient has a garden at home he enjoys working in. He is retired but does a lot of work around his house and in his neighborhood. Enjoys Radio producer and playing golf. He enjoys watching college sports.  [place]  Typically goes to bed around 11-11:30. Tosses and turns a lot due to hip pain. Wakes up around 6-7 and then goes back to bed to around 8am. Wakes up feeling rested. Objective:    Vitals: BP 134/82 Comment: Vitals done in visit today with Dr. [name]  Pulse (!) 54   Temp 97.8 F (36.6 C) (Oral)   Ht 5\' 10"  (1.778 m)   Wt 194 lb (88 kg)   SpO2 97%   BMI 27.84 kg/m   Body mass index is 27.84 kg/m.  Advanced Directives 01/31/2018 04/01/2014 04/01/2014 03/26/2014  Does Patient Have a Medical Advance Directive? Yes - Yes Yes  Type of Paramedic of [place];Living will - [place];Living will [place];Living will  Does patient want to make changes to medical advance directive? No - Patient declined - No - Patient declined -  Copy of [place] in Chart? No - copy requested No - copy requested - No - copy requested    Tobacco Social History   Tobacco Use  Smoking Status Never Smoker  Smokeless Tobacco Never Used     Counseling given: Not Answered   Past Medical History:  Diagnosis Date  . Adenomatous colon polyp 09/07/2004  . Amblyopia   . CAD (coronary artery disease)    cardiac CT with a 50% LAD lesion in 2007  . Diverticulosis     . DM2 (diabetes mellitus, type 2) ([place])   . Elevated LFTs    secondary to labetalol  . Esophageal stricture 2003  . External hemorrhoids 2012   lanced by Dr Rise Patience  . Family history of anesthesia complication    "sister had PONV"  . GERD (gastroesophageal reflux disease)    esophageal stricture, pmh  . Hiatal hernia 01/04/00  . HLD (hyperlipidemia)   . HTN (hypertension)   . Hypopotassemia    Past Surgical History:  Procedure Laterality Date  . BACK SURGERY    . CARDIAC CATHETERIZATION  2007; 03/26/2014  . COLONOSCOPY  2006   tics , ADENOMATOUS polyps. F/U due 2011  . COLONOSCOPY  2012   Tics  . CORONARY ANGIOPLASTY WITH STENT PLACEMENT  04/01/2014   "2"  . ESOPHAGEAL DILATION  2003  . ESOPHAGOGASTRODUODENOSCOPY  2003   ERD, stricture dilated  . EXCISIONAL HEMORRHOIDECTOMY  ~ 2008  . LEFT HEART CATHETERIZATION WITH CORONARY ANGIOGRAM N/A 03/26/2014   Procedure: LEFT HEART CATHETERIZATION WITH CORONARY ANGIOGRAM;  Surgeon: Peter M Martinique, MD;  Location: [facility] CATH LAB;  Service: Cardiovascular;  Laterality: N/A;  . PERCUTANEOUS CORONARY STENT INTERVENTION (PCI-S) N/A 04/01/2014   Procedure: PERCUTANEOUS CORONARY STENT INTERVENTION (PCI-S);  Surgeon: Peter M Martinique, MD;  Location: [facility] CATH LAB;  Service: Cardiovascular;  Laterality: N/A;  . POSTERIOR LAMINECTOMY / DECOMPRESSION LUMBAR SPINE  12/2009   L4 , Dr Sherwood Gambler   Family History  Problem Relation Age of Onset  . Heart attack Mother 1  . Stroke Mother 63  . Prostate cancer Father   . Diabetes Father   . Heart disease Father 89       CBAG   . Diabetes Brother   . Heart attack Brother   . Breast cancer Maternal Aunt   . Heart attack Maternal Grandfather 61  . Diabetes Paternal Grandmother   . Stroke Paternal Grandfather 27  . Heart disease Sister    Social History   Socioeconomic History  . Marital status: Single    Spouse name: Not on file  . Number of children: 0  . Years of education: 34  . Highest  education level: Not on file  Occupational History  . Occupation: Retired   Scientific laboratory technician  . Financial resource strain: Not on file  . Food insecurity:    Worry: Not on file    Inability: Not on file  . Transportation needs:    Medical: Not on file    Non-medical: Not on file  Tobacco Use  . Smoking status: Never Smoker  . Smokeless tobacco: Never Used  Substance and Sexual Activity  . Alcohol use: Yes    Alcohol/week: 1.2 oz    Types: 2 Cans of beer per week  . Drug use: No  . Sexual activity: Not Currently  Lifestyle  . Physical activity:    Days per week: Not on file    Minutes per session: Not on file  . Stress: Not on file  Relationships  . Social connections:    Talks on phone: Not on file    Gets together: Not on file    Attends religious service: Not on file    Active member of club or organization: Not on file    Attends meetings of clubs or organizations: Not on file    Relationship status: Not on file  Other Topics Concern  . Not on file  Social History Narrative   Fun: Designer, jewellery.     Outpatient Encounter Medications as of 01/31/2018  Medication Sig  . atorvastatin (LIPITOR) 80 MG tablet Take 1 tablet by mouth  daily at 6pm  . carvedilol (COREG) 3.125 MG tablet Take 1 tablet (3.125 mg) by mouth 2 (two) times daily  with a meal.  . CINNAMON PO Take 2 tablets by mouth daily.  . clopidogrel (PLAVIX) 75 MG tablet Take 1 tablet (75 mg total) by mouth daily.  . Coenzyme Q10 (CO Q 10 PO) Take 200 mg by mouth daily.   . Cyanocobalamin (VITAMIN B 12 PO) Take 1 tablet by mouth daily.  Marland Kitchen glucose blood (ONE TOUCH ULTRA TEST) test strip Use one strip per test. Test blood sugars 1-4 times daily as instructed.  . irbesartan (AVAPRO) 300 MG tablet Take 1 tablet (300 mg total) by mouth daily.  . metFORMIN (GLUCOPHAGE) 500 MG tablet Take 1 tablet by mouth  twice a day with a meal  . Multiple Vitamins-Minerals (MULTIVITAMIN WITH MINERALS) tablet Take 1 tablet by mouth  daily.  . nitroGLYCERIN (NITROSTAT) 0.4 MG SL tablet Place 1 tablet (0.4 mg total) under the tongue every 5 (five) minutes as needed for chest pain.  . Omega-3 Fatty Acids (FISH OIL) 1200 MG CPDR Take 1,200 mg by mouth 2 (two) times daily.  . sildenafil (REVATIO) 20 MG tablet Take 1-5 tablets by mouth daily as needed.  Marland Kitchen spironolactone (ALDACTONE)  25 MG tablet Take 1 tablet by mouth  daily   No facility-administered encounter medications on file as of 01/31/2018.     Activities of Daily Living In your present state of health, do you have any difficulty performing the following activities: 01/31/2018  Hearing? N  Vision? N  Difficulty concentrating or making decisions? N  Walking or climbing stairs? N  Dressing or bathing? N  Doing errands, shopping? N  Preparing Food and eating ? N  Using the Toilet? N  In the past six months, have you accidently leaked urine? N  Do you have problems with loss of bowel control? N  Managing your Medications? N  Managing your Finances? N  Housekeeping or managing your Housekeeping? N  Some recent data might be hidden    Patient Care Team: Vivi Barrack, MD as PCP - General (Family Medicine)   Assessment:   This is a routine wellness examination for [name] [name].  Exercise Activities and Dietary recommendations Current Exercise Habits: Home exercise routine, Type of exercise: walking, Time (Minutes): 60, Frequency (Times/Week): 4, Weekly Exercise (Minutes/Week): 240, Intensity: Mild, Exercise limited by: orthopedic condition(s)   Breakfast: bowl of cereal, occasional egg, bacon, and cheese sandwich, drinks a bottle of water in the morning and orange juice occasionally  Lunch: Sandwich, hummus with vegetables, goes out to eat lunch a lot, Loves to eat at [employer]. Normally drinks tea.  Dinner: Chicken sandwich and fries with tea. Mixes sweet tea and unsweet tea.  Very seldom has  soda Goals    . DIET - INCREASE WATER INTAKE     Drinks 2 bottles  of water a day but wants to increase to more    . Weight (lb) < 200 lb (90.7 kg)     Would like to lose 10lbs in the next year       Fall Risk Fall Risk  01/31/2018 01/17/2017 08/10/2015  Falls in the past year? No No No     Depression Screen PHQ 2/9 Scores 01/31/2018 01/17/2017 08/10/2015  PHQ - 2 Score 0 0 0    Cognitive Function     6CIT Screen 01/31/2018  What Year? 0 points  What month? 0 points  What time? 0 points  Count back from 20 0 points  Months in reverse 0 points    Immunization History  Administered Date(s) Administered  . Influenza,inj,Quad PF,6+ Mos 03/19/2014  . Pneumococcal Conjugate-13 01/17/2017  . Pneumococcal Polysaccharide-23 01/31/2018  . Td 12/18/2008  . Zoster 12/24/2012      Screening Tests Health Maintenance  Topic Date Due  . Hepatitis C Screening  Nov 11, 1950  . HEMOGLOBIN A1C  07/20/2017  . PNA vac Low Risk Adult (2 of 2 - PPSV23) 01/17/2018  . COLONOSCOPY  03/27/2021 (Originally 04/24/2016)  . INFLUENZA VACCINE  06/26/2098 (Originally 01/25/2018)  . OPHTHALMOLOGY EXAM  10/10/2018  . FOOT EXAM  11/11/2018  . TETANUS/TDAP  12/19/2018         Plan:    Follow Up with PCP  I have personally reviewed and noted the following in the patient's chart:   . Medical and social history . Use of alcohol, tobacco or illicit drugs  . Current medications and supplements . Functional ability and status . Nutritional status . Physical activity . Advanced directives . List of other physicians . Vitals . Screenings to include cognitive, depression, and falls . Referrals and appointments  In addition, I have reviewed and discussed with patient certain preventive protocols, quality metrics, and best  practice recommendations. A written personalized care plan for preventive services as well as general preventive health recommendations were provided to patient.     [place], [place]  03/05/11

## 2018-01-31 NOTE — Patient Instructions (Signed)
It was very nice to see you today!  You are doing a great job! Keep up the good work!  No medication changes today.  We will check blood work and give you your pneumonia shot today.   Take care, Dr [name]

## 2018-01-31 NOTE — Patient Instructions (Addendum)
[name] , Thank you for taking time to come for your Medicare Wellness Visit. I appreciate your ongoing commitment to your health goals. Please review the following plan we discussed and let me know if I can assist you in the future.   These are the goals we discussed: Goals    . DIET - INCREASE WATER INTAKE     Drinks 2 bottles of water a day but wants to increase to more    . Weight (lb) < 200 lb (90.7 kg)     Would like to lose 10lbs in the next year       This is a list of the screening recommended for you and due dates:  Health Maintenance  Topic Date Due  .  Hepatitis C: One time screening is recommended by Center for Disease Control  (CDC) for  adults born from [birth year] through 1966.   [birth year]  . Hemoglobin A1C  07/20/2017  . Pneumonia vaccines (2 of 2 - PPSV23) 01/17/2018  . Colon Cancer Screening  03/27/2021*  . Flu Shot  06/26/2098*  . Eye exam for diabetics  10/10/2018  . Complete foot exam   11/11/2018  . Tetanus Vaccine  12/19/2018  *Topic was postponed. The date shown is not the original due date.   Preventive Care for Adults  A healthy lifestyle and preventive care can promote health and wellness. Preventive health guidelines for adults include the following key practices.  . A routine yearly physical is a good way to check with your health care provider about your health and preventive screening. It is a chance to share any concerns and updates on your health and to receive a thorough exam.  . Visit your dentist for a routine exam and preventive care every 6 months. Brush your teeth twice a day and floss once a day. Good oral hygiene prevents tooth decay and gum disease.  . The frequency of eye exams is based on your age, health, family medical history, use  of contact lenses, and other factors. Follow your health care provider's recommendations for frequency of eye exams.  . Eat a healthy diet. Foods like vegetables, fruits, whole grains, low-fat dairy  products, and lean protein foods contain the nutrients you need without too many calories. Decrease your intake of foods high in solid fats, added sugars, and salt. Eat the right amount of calories for you. Get information about a proper diet from your health care provider, if necessary.  . Regular physical exercise is one of the most important things you can do for your health. Most adults should get at least 150 minutes of moderate-intensity exercise (any activity that increases your heart rate and causes you to sweat) each week. In addition, most adults need muscle-strengthening exercises on 2 or more days a week.  Silver Sneakers may be a benefit available to you. To determine eligibility, you may visit the website: www.silversneakers.com or contact program at (332)654-4768 Mon-Fri between 8AM-8PM.   . Maintain a healthy weight. The body mass index (BMI) is a screening tool to identify possible weight problems. It provides an estimate of body fat based on height and weight. Your health care provider can find your BMI and can help you achieve or maintain a healthy weight.   For adults 20 years and older: ? A BMI below 18.5 is considered underweight. ? A BMI of 18.5 to 24.9 is normal. ? A BMI of 25 to 29.9 is considered overweight. ? A BMI of 30 and above  is considered obese.   . Maintain normal blood lipids and cholesterol levels by exercising and minimizing your intake of saturated fat. Eat a balanced diet with plenty of fruit and vegetables. Blood tests for lipids and cholesterol should begin at age 40 and be repeated every 5 years. If your lipid or cholesterol levels are high, you are over 50, or you are at high risk for heart disease, you may need your cholesterol levels checked more frequently. Ongoing high lipid and cholesterol levels should be treated with medicines if diet and exercise are not working.  . If you smoke, find out from your health care provider how to quit. If you do not  use tobacco, please do not start.  . If you choose to drink alcohol, please do not consume more than 2 drinks per day. One drink is considered to be 12 ounces (355 mL) of beer, 5 ounces (148 mL) of wine, or 1.5 ounces (44 mL) of liquor.  . If you are 1-60 years old, ask your health care provider if you should take aspirin to prevent strokes.  . Use sunscreen. Apply sunscreen liberally and repeatedly throughout the day. You should seek shade when your shadow is shorter than you. Protect yourself by wearing long sleeves, pants, a wide-brimmed hat, and sunglasses year round, whenever you are outdoors.  . Once a month, do a whole body skin exam, using a mirror to look at the skin on your back. Tell your health care provider of new moles, moles that have irregular borders, moles that are larger than a pencil eraser, or moles that have changed in shape or color.

## 2018-01-31 NOTE — Progress Notes (Signed)
I have personally reviewed the Medicare Annual Wellness Visit and agree with the assessment and plan.  [name] [name]. [name], MD 01/31/2018 10:54 AM

## 2018-01-31 NOTE — Assessment & Plan Note (Addendum)
Check lipid panel today.  Continue Lipitor 80 mg daily.

## 2018-02-01 DIAGNOSIS — M25561 Pain in right knee: Secondary | ICD-10-CM | POA: Diagnosis not present

## 2018-02-01 LAB — HEPATITIS C ANTIBODY
Hepatitis C Ab: NONREACTIVE
SIGNAL TO CUT-OFF: 0.01 (ref <1.00)

## 2018-02-06 NOTE — Progress Notes (Signed)
Dr Marigene Ehlers interpretation of your lab work:  Your hepatitis C test is NORMAL. Your blood counts are NORMAL. Your cholesterol levels look good. Your electrolytes, kidney function, and liver function are all normal.  We do not need to make any changes to your treatment plan at this time. Keep up the good work! We can recheck blood work again next year.    If you have any additional questions, please give [place] a call or send [place] a message through [place].  Take care, Dr [name]

## 2018-02-12 ENCOUNTER — Telehealth: Payer: Self-pay

## 2018-02-12 NOTE — Telephone Encounter (Signed)
Received copies of patient's healthcare POA and living will.  They are being scanned into the patient's chart.

## 2018-03-03 NOTE — Progress Notes (Signed)
[name] [name] Date of Birth: 10-16-1950 Medical Record #[id number]  History of Present Illness: [name] [name] is seen for cardiac followup CAD. He has a history of coronary disease diagnosed by cardiac CTA in 2007. In September 2015 he presented with chest pain. Myoview study demonstrated anterior wall ischemia. Subsequent cardiac cath demonstrated severe disease in the mid LAD and first diagonal with 50-70% disease in the OM2. On 04/01/14 he had successful stenting of the LAD and diagonal with DES. FFR of the OM was normal. Despite aggressive pretreatment for dye allergy with steroids, pepcid, and benadryl he still developed hives and diarrhea post procedure.   On follow up today he reports he is doing well. He denies any chest pain or SOB. No palpitations or edema. He is walking some and playing golf. He has lost 5 lbs.   Current Outpatient Medications on File Prior to Visit  Medication Sig Dispense Refill  . atorvastatin (LIPITOR) 80 MG tablet Take 1 tablet by mouth  daily at 6pm 90 tablet 3  . carvedilol (COREG) 3.125 MG tablet Take 1 tablet (3.125 mg) by mouth 2 (two) times daily  with a meal. 180 tablet 3  . CINNAMON PO Take 2 tablets by mouth daily.    . clopidogrel (PLAVIX) 75 MG tablet Take 1 tablet (75 mg total) by mouth daily. 90 tablet 3  . Coenzyme Q10 (CO Q 10 PO) Take 200 mg by mouth daily.     . Cyanocobalamin (VITAMIN B 12 PO) Take 1 tablet by mouth daily.    Marland Kitchen glucose blood (ONE TOUCH ULTRA TEST) test strip Use one strip per test. Test blood sugars 1-4 times daily as instructed. 25 each 5  . irbesartan (AVAPRO) 300 MG tablet Take 1 tablet (300 mg total) by mouth daily. 90 tablet 3  . metFORMIN (GLUCOPHAGE) 500 MG tablet Take 1 tablet by mouth  twice a day with a meal 180 tablet 3  . Multiple Vitamins-Minerals (MULTIVITAMIN WITH MINERALS) tablet Take 1 tablet by mouth daily.    . nitroGLYCERIN (NITROSTAT) 0.4 MG SL tablet Place 1 tablet (0.4 mg total) under the tongue every 5 (five)  minutes as needed for chest pain. 100 tablet 1  . Omega-3 Fatty Acids (FISH OIL) 1200 MG CPDR Take 1,200 mg by mouth 2 (two) times daily.    . sildenafil (REVATIO) 20 MG tablet Take 1-5 tablets by mouth daily as needed. 50 tablet 0  . spironolactone (ALDACTONE) 25 MG tablet Take 1 tablet by mouth  daily 90 tablet 3   No current facility-administered medications on file prior to visit.     Allergies  Allergen Reactions  . Aspirin Other (See Comments)    Angioedema  . Contrast Media [Iodinated Diagnostic Agents] Itching and Rash  . Norvasc [Amlodipine Besylate] Other (See Comments)    09/12/2013 gingival hyperplasia , Dr Geralynn Ochs ,DDS    Past Medical History:  Diagnosis Date  . Adenomatous colon polyp 09/07/2004  . Amblyopia   . CAD (coronary artery disease)    cardiac CT with a 50% LAD lesion in 2007  . Diverticulosis   . DM2 (diabetes mellitus, type 2) ([place])   . Elevated LFTs    secondary to labetalol  . Esophageal stricture 2003  . External hemorrhoids 2012   lanced by Dr Rise Patience  . Family history of anesthesia complication    "sister had PONV"  . GERD (gastroesophageal reflux disease)    esophageal stricture, pmh  . Hiatal hernia 01/04/00  . HLD (hyperlipidemia)   .  HTN (hypertension)   . Hypopotassemia     Past Surgical History:  Procedure Laterality Date  . BACK SURGERY    . CARDIAC CATHETERIZATION  2007; 03/26/2014  . COLONOSCOPY  2006   tics , ADENOMATOUS polyps. F/U due 2011  . COLONOSCOPY  2012   Tics  . CORONARY ANGIOPLASTY WITH STENT PLACEMENT  04/01/2014   "2"  . ESOPHAGEAL DILATION  2003  . ESOPHAGOGASTRODUODENOSCOPY  2003   ERD, stricture dilated  . EXCISIONAL HEMORRHOIDECTOMY  ~ 2008  . LEFT HEART CATHETERIZATION WITH CORONARY ANGIOGRAM N/A 03/26/2014   Procedure: LEFT HEART CATHETERIZATION WITH CORONARY ANGIOGRAM;  Surgeon: Peter M Martinique, MD;  Location: [facility] CATH LAB;  Service: Cardiovascular;  Laterality: N/A;  . PERCUTANEOUS CORONARY STENT  INTERVENTION (PCI-S) N/A 04/01/2014   Procedure: PERCUTANEOUS CORONARY STENT INTERVENTION (PCI-S);  Surgeon: Peter M Martinique, MD;  Location: [facility] CATH LAB;  Service: Cardiovascular;  Laterality: N/A;  . POSTERIOR LAMINECTOMY / DECOMPRESSION LUMBAR SPINE  12/2009   L4 , Dr Sherwood Gambler    Social History   Tobacco Use  Smoking Status Never Smoker  Smokeless Tobacco Never Used    Social History   Substance and Sexual Activity  Alcohol Use Yes  . Alcohol/week: 2.0 standard drinks  . Types: 2 Cans of beer per week    Family History  Problem Relation Age of Onset  . Heart attack Mother 57  . Stroke Mother 53  . Prostate cancer Father   . Diabetes Father   . Heart disease Father 50       CBAG   . Diabetes Brother   . Heart attack Brother   . Breast cancer Maternal Aunt   . Heart attack Maternal Grandfather 61  . Diabetes Paternal Grandmother   . Stroke Paternal Grandfather 36  . Heart disease Sister     Review of Systems: As noted in HPI. All other systems were reviewed and are negative.  Physical Exam: BP 128/68 (BP Location: Left Arm, Patient Position: Sitting, Cuff Size: Normal)   Pulse (!) 55   Ht 5\' 10"  (1.778 m)   Wt 189 lb (85.7 kg)   BMI 27.12 kg/m   GENERAL:  Well appearing WM in NAD HEENT:  PERRL, EOMI, sclera are clear. Oropharynx is clear. NECK:  No jugular venous distention, carotid upstroke brisk and symmetric, no bruits, no thyromegaly or adenopathy LUNGS:  Clear to auscultation bilaterally CHEST:  Unremarkable HEART:  RRR,  PMI not displaced or sustained,S1 and S2 within normal limits, no S3, no S4: no clicks, no rubs, no murmurs ABD:  Soft, nontender. BS +, no masses or bruits. No hepatomegaly, no splenomegaly EXT:  2 + pulses throughout, no edema, no cyanosis no clubbing SKIN:  Warm and dry.  No rashes NEURO:  Alert and oriented x 3. Cranial nerves II through XII intact. PSYCH:  Cognitively intact        LABORATORY DATA: Lab Results  Component  Value Date   WBC 7.3 01/31/2018   HGB 14.9 01/31/2018   HCT 43.9 01/31/2018   PLT 200.0 01/31/2018   GLUCOSE 134 (H) 01/31/2018   CHOL 134 01/31/2018   TRIG 134.0 01/31/2018   HDL 42.50 01/31/2018   LDLCALC 65 01/31/2018   ALT 19 01/31/2018   AST 14 01/31/2018   NA 140 01/31/2018   K 4.6 01/31/2018   CL 104 01/31/2018   CREATININE 0.98 01/31/2018   BUN 15 01/31/2018   CO2 30 01/31/2018   TSH 1.09 03/19/2014   PSA  1.26 01/17/2017   INR 1.0 03/19/2014   HGBA1C 6.5 (A) 01/31/2018   MICROALBUR 0.7 01/17/2017     Assessment / Plan: 1. Coronary disease s/p DES stenting of the LAD and first diagonal in October 2015. Normal FFR of OM2. He is asymptomatic.  Will continue Plavix long term since he is ASA allergic.  Continue risk factor modification.  Encourage weight loss and increased aerobic activity.  2. Hyperlipidemia.  On high dose lipitor. Lipids at goal.  3. Diabetes mellitus type 2. On metformin. Followed by primary care.  Continue dietary modification. A1c 6.5%.   4. Hypertension-controlled.  5. Contrast dye allergy with hives- despite pretreatment.  I will follow up in 6 months.

## 2018-03-07 ENCOUNTER — Encounter: Payer: Self-pay | Admitting: Cardiology

## 2018-03-07 ENCOUNTER — Ambulatory Visit (INDEPENDENT_AMBULATORY_CARE_PROVIDER_SITE_OTHER): Payer: Medicare Other | Admitting: Cardiology

## 2018-03-07 VITALS — BP 128/68 | HR 55 | Ht 70.0 in | Wt 189.0 lb

## 2018-03-07 DIAGNOSIS — E7800 Pure hypercholesterolemia, unspecified: Secondary | ICD-10-CM

## 2018-03-07 DIAGNOSIS — E1169 Type 2 diabetes mellitus with other specified complication: Secondary | ICD-10-CM | POA: Diagnosis not present

## 2018-03-07 DIAGNOSIS — I25118 Atherosclerotic heart disease of native coronary artery with other forms of angina pectoris: Secondary | ICD-10-CM | POA: Diagnosis not present

## 2018-03-07 DIAGNOSIS — E669 Obesity, unspecified: Secondary | ICD-10-CM

## 2018-03-07 DIAGNOSIS — I10 Essential (primary) hypertension: Secondary | ICD-10-CM | POA: Diagnosis not present

## 2018-03-07 NOTE — Patient Instructions (Addendum)
Continue your current therapy  I will see you in 6 months.   

## 2018-04-23 DIAGNOSIS — Z23 Encounter for immunization: Secondary | ICD-10-CM | POA: Diagnosis not present

## 2018-05-04 ENCOUNTER — Ambulatory Visit (INDEPENDENT_AMBULATORY_CARE_PROVIDER_SITE_OTHER): Payer: Medicare Other | Admitting: Podiatry

## 2018-05-04 ENCOUNTER — Encounter: Payer: Self-pay | Admitting: Podiatry

## 2018-05-04 DIAGNOSIS — L600 Ingrowing nail: Secondary | ICD-10-CM

## 2018-05-04 NOTE — Patient Instructions (Signed)

## 2018-05-06 NOTE — Progress Notes (Signed)
Subjective:   Patient ID: [name], male   DOB: 67 y.o.   MRN: [id number]   HPI Patient presents with chronic ingrown toenail left hallux that is been painful and making it hard to wear shoe gear.  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  I explained procedure and risk and patient wants surgery signed consent form and today I infiltrated the left hallux 60 mg like Marcaine mixture sterile prep applied to the toe and using sterile instrumentation I remove the lateral border exposed matrix and applied phenol 3 applications 30 seconds followed by alcohol lavage and sterile dressing.  Gave instructions on soaks and reappoint

## 2018-05-23 ENCOUNTER — Telehealth: Payer: Self-pay | Admitting: Podiatry

## 2018-05-23 NOTE — Telephone Encounter (Signed)
I called pt and told him it was not unusual for the toenail procedure site to still be red, swollen, that this may continue to varying degree over the next 4 - 6 weeks, and the symptoms should decrease the further he got from the procedure date, but if they worsened to call. Pt states he got up on a roof and he felt the press in the shoe caused the area to bleed. Pt states I drove over and am in the parking, but if you think it will be okay, I will go on home. I told pt to come in and I would take a look at the toe. Pt came in to the office, and I took pt into an exam room. Pt removed the fabric bandaid from the left great toe, and stated the area actually looked better now than it did this morning. The procedure site was light pink, with soft scabbing to the medial side. I told pt that the site looked good for this period of time for healing, to continue the epsom salt soaks daily and the antibiotic ointment and allow to air dry at bed time. I told pt that if the area had increased redness, swelling or cloudy drainage as discussed previously to call the office. Pt states understanding.

## 2018-05-23 NOTE — Telephone Encounter (Signed)
Had ingrown toenail removed 11/8. Still experiencing redness/swelling. Pt wants to know if this is normal. Also wanting to know if he could come by to have it checked today.

## 2018-08-08 ENCOUNTER — Ambulatory Visit (INDEPENDENT_AMBULATORY_CARE_PROVIDER_SITE_OTHER): Payer: Medicare Other | Admitting: Family Medicine

## 2018-08-08 ENCOUNTER — Encounter: Payer: Self-pay | Admitting: Family Medicine

## 2018-08-08 VITALS — BP 124/72 | HR 56 | Temp 97.8°F | Ht 70.0 in | Wt 195.4 lb

## 2018-08-08 DIAGNOSIS — E1165 Type 2 diabetes mellitus with hyperglycemia: Secondary | ICD-10-CM | POA: Diagnosis not present

## 2018-08-08 DIAGNOSIS — I10 Essential (primary) hypertension: Secondary | ICD-10-CM

## 2018-08-08 DIAGNOSIS — E1169 Type 2 diabetes mellitus with other specified complication: Secondary | ICD-10-CM | POA: Diagnosis not present

## 2018-08-08 DIAGNOSIS — E1159 Type 2 diabetes mellitus with other circulatory complications: Secondary | ICD-10-CM | POA: Diagnosis not present

## 2018-08-08 DIAGNOSIS — E785 Hyperlipidemia, unspecified: Secondary | ICD-10-CM | POA: Diagnosis not present

## 2018-08-08 DIAGNOSIS — I152 Hypertension secondary to endocrine disorders: Secondary | ICD-10-CM

## 2018-08-08 LAB — POCT GLYCOSYLATED HEMOGLOBIN (HGB A1C): HEMOGLOBIN A1C: 6.7 % — AB (ref 4.0–5.6)

## 2018-08-08 NOTE — Progress Notes (Signed)
   Chief Complaint:  [name] [name] is a 68 y.o. male who presents today with a chief complaint of T2DM.   Assessment/Plan:  T2DM (type 2 diabetes mellitus) (HCC)  A1c stable at 6.7.  Continue metformin 500 mg twice daily.  Follow-up in 6 months.  Hypertension associated with diabetes ([place]) At goal.  Continue Coreg 3.125 mg twice daily, irbesartan 300 mg daily, and spironolactone 25 mg daily.  Hyperlipidemia associated with type 2 diabetes mellitus (HCC) Last LDL 65.  Continue atorvastatin 80 mg daily.  Check lipid panel next blood draw.    Subjective:  HPI:  His chronic medical conditions are outlined below:  # T2DM - On metformin 500mg  twice daily. Tolerating well.  - ROS: No reported polyuria or polydipsia  # Essential Hypertension - On coreg 3.125mg  twice daily, irbesartan 300mg  daily, and spironolactone 25mg  daily.  Tolerating well. - ROS: No reported chest pain or shortness of breath  # Dyslipidemia / Coronary Artery Disease - On lipitor 80mg  daily, plavix 75 mg daily.  Tolerating well. - ROS: No reported myalgias.  ROS: Per HPI  PMH: He reports that he has never smoked. He has never used smokeless tobacco. He reports current alcohol use of about 2.0 standard drinks of alcohol per week. He reports that he does not use drugs.      Objective:  Physical Exam: BP 124/72 (BP Location: Left Arm, Patient Position: Sitting, Cuff Size: Normal)   Pulse (!) 56   Temp 97.8 F (36.6 C) (Oral)   Ht 5\' 10"  (1.778 m)   Wt 195 lb 6.4 oz (88.6 kg)   SpO2 97%   BMI 28.04 kg/m   Gen: NAD, resting comfortably CV: Regular rate and rhythm with no murmurs appreciated Pulm: Normal work of breathing, clear to auscultation bilaterally with no crackles, wheezes, or rhonchi  Results for orders placed or performed in visit on 08/08/18 (from the past 24 hour(s))  POCT glycosylated hemoglobin (Hb A1C)     Status: Abnormal   Collection Time: 08/08/18  9:16 AM  Result Value Ref Range   Hemoglobin A1C 6.7 (A) 4.0 - 5.6 %        Vertis Scheib M. [name], MD 08/08/2018 9:52 AM

## 2018-08-08 NOTE — Assessment & Plan Note (Signed)
A1c stable at 6.7.  Continue metformin 500 mg twice daily.  Follow-up in 6 months.

## 2018-08-08 NOTE — Assessment & Plan Note (Signed)
At goal.  Continue Coreg 3.125 mg twice daily, irbesartan 300 mg daily, and spironolactone 25 mg daily.

## 2018-08-08 NOTE — Patient Instructions (Signed)
It was very nice to see you today!  Keep up the good work!  No changes today.  Come back in 6 months, or sooner as needed.   Take care, Dr [name]

## 2018-08-08 NOTE — Assessment & Plan Note (Signed)
Last LDL 65.  Continue atorvastatin 80 mg daily.  Check lipid panel next blood draw.

## 2018-09-08 NOTE — Progress Notes (Signed)
[name] [name] Date of Birth: 09-10-1950 Medical Record #[id number]  History of Present Illness: [name] [name] is seen for cardiac followup CAD. He has a history of coronary disease diagnosed by cardiac CTA in 2007. In September 2015 he presented with chest pain. Myoview study demonstrated anterior wall ischemia. Subsequent cardiac cath demonstrated severe disease in the mid LAD and first diagonal with 50-70% disease in the OM2. On 04/01/14 he had successful stenting of the LAD and diagonal with DES. FFR of the OM was normal. Despite aggressive pretreatment for dye allergy with steroids, pepcid, and benadryl he still developed hives and diarrhea post procedure.   On follow up today he reports he is doing well. Had a recent sinus infection that has  Resolved. He denies any chest pain or SOB. No palpitations or edema. Planning to start playing more golf.   Current Outpatient Medications on File Prior to Visit  Medication Sig Dispense Refill  . atorvastatin (LIPITOR) 80 MG tablet Take 1 tablet by mouth  daily at 6pm 90 tablet 3  . carvedilol (COREG) 3.125 MG tablet Take 1 tablet (3.125 mg) by mouth 2 (two) times daily  with a meal. 180 tablet 3  . CINNAMON PO Take 2 tablets by mouth daily.    . clopidogrel (PLAVIX) 75 MG tablet Take 1 tablet (75 mg total) by mouth daily. 90 tablet 3  . Coenzyme Q10 (CO Q 10 PO) Take 200 mg by mouth daily.     . Cyanocobalamin (VITAMIN B 12 PO) Take 1 tablet by mouth daily.    Marland Kitchen glucose blood (ONE TOUCH ULTRA TEST) test strip Use one strip per test. Test blood sugars 1-4 times daily as instructed. 25 each 5  . irbesartan (AVAPRO) 300 MG tablet Take 1 tablet (300 mg total) by mouth daily. 90 tablet 3  . metFORMIN (GLUCOPHAGE) 500 MG tablet Take 1 tablet by mouth  twice a day with a meal 180 tablet 3  . Multiple Vitamins-Minerals (MULTIVITAMIN WITH MINERALS) tablet Take 1 tablet by mouth daily.    . nitroGLYCERIN (NITROSTAT) 0.4 MG SL tablet Place 1 tablet (0.4 mg total)  under the tongue every 5 (five) minutes as needed for chest pain. 100 tablet 1  . sildenafil (REVATIO) 20 MG tablet Take 1-5 tablets by mouth daily as needed. 50 tablet 0  . spironolactone (ALDACTONE) 25 MG tablet Take 1 tablet by mouth  daily 90 tablet 3   No current facility-administered medications on file prior to visit.     Allergies  Allergen Reactions  . Aspirin Other (See Comments)    Angioedema  . Contrast Media [Iodinated Diagnostic Agents] Itching and Rash  . Norvasc [Amlodipine Besylate] Other (See Comments)    09/12/2013 gingival hyperplasia , Dr Geralynn Ochs ,DDS    Past Medical History:  Diagnosis Date  . Adenomatous colon polyp 09/07/2004  . Amblyopia   . CAD (coronary artery disease)    cardiac CT with a 50% LAD lesion in 2007  . Diverticulosis   . DM2 (diabetes mellitus, type 2) ([place])   . Elevated LFTs    secondary to labetalol  . Esophageal stricture 2003  . External hemorrhoids 2012   lanced by Dr Rise Patience  . Family history of anesthesia complication    "sister had PONV"  . GERD (gastroesophageal reflux disease)    esophageal stricture, pmh  . Hiatal hernia 01/04/00  . HLD (hyperlipidemia)   . HTN (hypertension)   . Hypopotassemia     Past Surgical History:  Procedure  Laterality Date  . BACK SURGERY    . CARDIAC CATHETERIZATION  2007; 03/26/2014  . COLONOSCOPY  2006   tics , ADENOMATOUS polyps. F/U due 2011  . COLONOSCOPY  2012   Tics  . CORONARY ANGIOPLASTY WITH STENT PLACEMENT  04/01/2014   "2"  . ESOPHAGEAL DILATION  2003  . ESOPHAGOGASTRODUODENOSCOPY  2003   ERD, stricture dilated  . EXCISIONAL HEMORRHOIDECTOMY  ~ 2008  . LEFT HEART CATHETERIZATION WITH CORONARY ANGIOGRAM N/A 03/26/2014   Procedure: LEFT HEART CATHETERIZATION WITH CORONARY ANGIOGRAM;  Surgeon: Calix Heinbaugh M Martinique, MD;  [facility];  Service: Cardiovascular;  Laterality: N/A;  . PERCUTANEOUS CORONARY STENT INTERVENTION (PCI-S) N/A 04/01/2014   Procedure: PERCUTANEOUS CORONARY  STENT INTERVENTION (PCI-S);  Surgeon: Antiono Ettinger M Martinique, MD;  Location: [facility] C[facility] LAB;  Service: Cardiovascular;  Laterality: N/A;  . POSTERIOR LAMINECTOMY / DECOMPRESSION LUMBAR SPINE  12/2009   L4 , Dr Sherwood Gambler    Social History   Tobacco Use  Smoking Status Never Smoker  Smokeless Tobacco Never Used    Social History   Substance and Sexual Activity  Alcohol Use Yes  . Alcohol/week: 2.0 standard drinks  . Types: 2 Cans of beer per week    Family History  Problem Relation Age of Onset  . Heart attack Mother 20  . Stroke Mother 12  . Prostate cancer Father   . Diabetes Father   . Heart disease Father 63       CBAG   . Diabetes Brother   . Heart attack Brother   . Breast cancer Maternal Aunt   . Heart attack Maternal Grandfather 61  . Diabetes Paternal Grandmother   . Stroke Paternal Grandfather 66  . Heart disease Sister     Review of Systems: As noted in HPI. All other systems were reviewed and are negative.  Physical Exam: BP 126/78 (BP Location: Left Arm, Patient Position: Sitting, Cuff Size: Normal)   Pulse 60   Ht 5\' 10"  (1.778 m)   Wt 194 lb (88 kg)   BMI 27.84 kg/m   GENERAL:  Well appearing WM in NAD HEENT:  PERRL, EOMI, sclera are clear. Oropharynx is clear. NECK:  No jugular venous distention, carotid upstroke brisk and symmetric, no bruits, no thyromegaly or adenopathy LUNGS:  Clear to auscultation bilaterally CHEST:  Unremarkable HEART:  RRR,  PMI not displaced or sustained,S1 and S2 within normal limits, no S3, no S4: no clicks, no rubs, no murmurs ABD:  Soft, nontender. BS +, no masses or bruits. No hepatomegaly, no splenomegaly EXT:  2 + pulses throughout, no edema, no cyanosis no clubbing SKIN:  Warm and dry.  No rashes NEURO:  Alert and oriented x 3. Cranial nerves II through XII intact. PSYCH:  Cognitively intact      LABORATORY DATA: Lab Results  Component Value Date   WBC 7.3 01/31/2018   HGB 14.9 01/31/2018   HCT 43.9 01/31/2018    PLT 200.0 01/31/2018   GLUCOSE 134 (H) 01/31/2018   CHOL 134 01/31/2018   TRIG 134.0 01/31/2018   HDL 42.50 01/31/2018   LDLCALC 65 01/31/2018   ALT 19 01/31/2018   AST 14 01/31/2018   NA 140 01/31/2018   K 4.6 01/31/2018   CL 104 01/31/2018   CREATININE 0.98 01/31/2018   BUN 15 01/31/2018   CO2 30 01/31/2018   TSH 1.09 03/19/2014   PSA 1.26 01/17/2017   INR 1.0 03/19/2014   HGBA1C 6.7 (A) 08/08/2018   MICROALBUR 0.7 01/17/2017  Ecg today shows NSR with normal Ecg. I have personally reviewed and interpreted this study.   Assessment / Plan: 1. Coronary disease s/p DES stenting of the LAD and first diagonal in October 2015. Normal FFR of OM2. He is asymptomatic.  Will continue Plavix long term since he is ASA allergic.  Continue risk factor modification.  Encourage  increased aerobic activity. Follow up in one year  2. Hyperlipidemia.  On high dose lipitor. Lipids at goal. Plans to check lipids with primary care in August.   3. Diabetes mellitus type 2. On metformin. Followed by primary care.  Continue dietary modification. A1c 6.7%.   4. Hypertension-controlled.  5. Contrast dye allergy with hives- despite pretreatment.  I will follow up in one year

## 2018-09-10 ENCOUNTER — Ambulatory Visit (INDEPENDENT_AMBULATORY_CARE_PROVIDER_SITE_OTHER): Payer: Medicare Other | Admitting: Cardiology

## 2018-09-10 ENCOUNTER — Encounter: Payer: Self-pay | Admitting: Cardiology

## 2018-09-10 ENCOUNTER — Other Ambulatory Visit: Payer: Self-pay

## 2018-09-10 VITALS — BP 126/78 | HR 60 | Ht 70.0 in | Wt 194.0 lb

## 2018-09-10 DIAGNOSIS — I25118 Atherosclerotic heart disease of native coronary artery with other forms of angina pectoris: Secondary | ICD-10-CM | POA: Diagnosis not present

## 2018-09-10 DIAGNOSIS — I10 Essential (primary) hypertension: Secondary | ICD-10-CM

## 2018-09-10 DIAGNOSIS — E7800 Pure hypercholesterolemia, unspecified: Secondary | ICD-10-CM | POA: Diagnosis not present

## 2018-11-15 ENCOUNTER — Encounter: Payer: Self-pay | Admitting: Family Medicine

## 2018-11-15 ENCOUNTER — Ambulatory Visit (INDEPENDENT_AMBULATORY_CARE_PROVIDER_SITE_OTHER): Payer: Medicare Other | Admitting: Orthopedic Surgery

## 2018-11-15 ENCOUNTER — Other Ambulatory Visit: Payer: Self-pay

## 2018-11-15 ENCOUNTER — Ambulatory Visit (INDEPENDENT_AMBULATORY_CARE_PROVIDER_SITE_OTHER): Payer: Medicare Other

## 2018-11-15 ENCOUNTER — Encounter: Payer: Self-pay | Admitting: Orthopedic Surgery

## 2018-11-15 ENCOUNTER — Ambulatory Visit (INDEPENDENT_AMBULATORY_CARE_PROVIDER_SITE_OTHER): Payer: Medicare Other | Admitting: Family Medicine

## 2018-11-15 DIAGNOSIS — S46012A Strain of muscle(s) and tendon(s) of the rotator cuff of left shoulder, initial encounter: Secondary | ICD-10-CM | POA: Diagnosis not present

## 2018-11-15 DIAGNOSIS — M25512 Pain in left shoulder: Secondary | ICD-10-CM

## 2018-11-15 DIAGNOSIS — I25118 Atherosclerotic heart disease of native coronary artery with other forms of angina pectoris: Secondary | ICD-10-CM | POA: Diagnosis not present

## 2018-11-15 MED ORDER — DICLOFENAC SODIUM 75 MG PO TBEC: 75.0000 mg | DELAYED_RELEASE_TABLET | Freq: Two times a day (BID) | ORAL | 0 refills | Status: DC

## 2018-11-15 NOTE — Progress Notes (Signed)
    Chief Complaint:  [name] [name] is a 68 y.o. male who presents today for a virtual office visit with a chief complaint of left shoulder pain.   Assessment/Plan:  Left Shoulder Pain Concern for rotator cuff tear.  Also could represent dislocation.  He is neurologically intact distally.  Will start diclofenac for pain/inflammation.  Will place urgent referral to orthopedics for further evaluation/management.    Subjective:  HPI:  Left Shoulder Pain  Started 3 days ago.  Patient was playing golf when he struck the ground with his club.  Immediately noticed pain to his left shoulder.  Over the last 3 days symptoms of been stable.  He has had significant weakness in his left shoulder.  He is unable to lift his hand or arm more than about 15 or 20 degrees with either flexion or abduction.  No weakness or numbness.  No specific treatments tried.  Symptoms are overall stable.  Worse with certain positions.  No other obvious alleviating or aggravating factors.  ROS: Per HPI  PMH: He reports that he has never smoked. He has never used smokeless tobacco. He reports current alcohol use of about 2.0 standard drinks of alcohol per week. He reports that he does not use drugs.      Objective/Observations  Physical Exam: Gen: NAD, resting comfortably Pulm: Normal work of breathing Neuro: Grossly normal, moves all extremities Psych: Normal affect and thought content MSK: Left shoulder with no deformities.  Limited range of motion with flexion and abduction.  Able to fully close list and open hand.   Virtual Visit via Video   I connected with [name] [name] on 11/15/18 at  9:00 AM EDT by a video enabled telemedicine application and verified that I am speaking with the correct person using two identifiers. I discussed the limitations of evaluation and management by telemedicine and the availability of in person appointments. The patient expressed understanding and agreed to proceed.   Patient  location: Home Provider location: [place] participating in the virtual visit: Myself and Patient     Algis Greenhouse. [name], MD 11/15/2018 9:18 AM

## 2018-11-15 NOTE — Progress Notes (Signed)
Office Visit Note   Patient: [name] [name]           Date of Birth: 1950-11-11           MRN: [id number] Visit Date: 11/15/2018 Requested by: Vivi Barrack, MD [address] [place], [place] [address] PCP: Vivi Barrack, MD  Subjective: Chief Complaint  Patient presents with  . Left Shoulder - Pain    HPI: [name] [name] is a patient with left shoulder and arm pain.  He was playing golf 4 days ago when he hit the ball heavy and felt a pop in his shoulder.  He did continue to play and had some pretty big swings after that which hurt his shoulder further.  Denies any prior injury or surgery on that left shoulder.  He does not have a history of doing physical work during his lifetime.  He does like to work around his home and play golf.  He is able to sleep on that side.  He has been using ice and taking Tylenol.              ROS: All systems reviewed are negative as they relate to the chief complaint within the history of present illness.  Patient denies  fevers or chills.   Assessment & Plan: Visit Diagnoses:  1. Left shoulder pain, unspecified chronicity   2. Traumatic complete tear of left rotator cuff, initial encounter     Plan: Impression is left shoulder rotator cuff tear with weakness and external rotation and abduction.  Subscap strength feels pretty reasonable.  No Popeye deformity present.  Passively the range of motion is well above 90 degrees of forward flexion and abduction.  I think based on the ecchymosis and weakness that he has that rotator cuff pathology is highly likely.  MRI scan pending.  Follow-Up Instructions: Return if symptoms worsen or fail to improve.   Orders:  Orders Placed This Encounter  Procedures  . XR Shoulder Left  . MR Shoulder Left w/ contrast  . Arthrogram   No orders of the defined types were placed in this encounter.     Procedures: No procedures performed   Clinical Data: No additional findings.  Objective: Vital Signs: There were no  vitals taken for this visit.  Physical Exam:   Constitutional: Patient appears well-developed HEENT:  Head: Normocephalic Eyes:EOM are normal Neck: Normal range of motion Cardiovascular: Normal rate Pulmonary/chest: Effort normal Neurologic: Patient is alert Skin: Skin is warm Psychiatric: Patient has normal mood and affect    Ortho Exam: Ortho exam demonstrates some ecchymosis anterior shoulder region.  He has forward flexion to about 30 and isolated glenohumeral abduction to about 45.  He does have some weakness to infraspinatus and supraspinatus testing.  Subscap strength seems to be intact.  No Popeye deformity palpable in the biceps region.  Deltoid fires.  There is some coarse grinding and crepitus with internal and external rotation of that left arm above 90 degrees.  Cervical spine range of motion full.  Radial pulse intact.  Specialty Comments:  No specialty comments available.  Imaging: Xr Shoulder Left  Result Date: 11/15/2018 AP outlet axillary left shoulder reviewed.  Acromiohumeral distance maintained.  No glenohumeral arthritis noted.  Mild AC joint degenerative changes.  Visualized lung fields clear.  Shoulder is located.    PMFS History: Patient Active Problem List   Diagnosis Date Noted  . Diverticulosis of colon (without mention of hemorrhage) 04/25/2011  . External thrombosed hemorrhoids 04/25/2011  .  Adenomatous colon polyp 03/17/2011  . CAD, NATIVE VESSEL 11/25/2009  . LOW BACK PAIN, CHRONIC 12/13/2007  . Hyperlipidemia associated with type 2 diabetes mellitus ([place]) 06/08/2007  . T2DM (type 2 diabetes mellitus) ([place]) 02/27/2007  . Hypertension associated with diabetes ([place]) 02/27/2007  . AMBLYOPIA 12/06/2006  . GERD 12/06/2006   Past Medical History:  Diagnosis Date  . Adenomatous colon polyp 09/07/2004  . Amblyopia   . CAD (coronary artery disease)    cardiac CT with a 50% LAD lesion in 2007  . Diverticulosis   . DM2 (diabetes mellitus, type 2)  ([place])   . Elevated LFTs    secondary to labetalol  . Esophageal stricture 2003  . External hemorrhoids 2012   lanced by Dr Rise Patience  . Family history of anesthesia complication    "sister had PONV"  . GERD (gastroesophageal reflux disease)    esophageal stricture, pmh  . Hiatal hernia 01/04/00  . HLD (hyperlipidemia)   . HTN (hypertension)   . Hypopotassemia     Family History  Problem Relation Age of Onset  . Heart attack Mother 26  . Stroke Mother 64  . Prostate cancer Father   . Diabetes Father   . Heart disease Father 76       CBAG   . Diabetes Brother   . Heart attack Brother   . Breast cancer Maternal Aunt   . Heart attack Maternal Grandfather 61  . Diabetes Paternal Grandmother   . Stroke Paternal Grandfather 54  . Heart disease Sister     Past Surgical History:  Procedure Laterality Date  . BACK SURGERY    . CARDIAC CATHETERIZATION  2007; 03/26/2014  . COLONOSCOPY  2006   tics , ADENOMATOUS polyps. F/U due 2011  . COLONOSCOPY  2012   Tics  . CORONARY ANGIOPLASTY WITH STENT PLACEMENT  04/01/2014   "2"  . ESOPHAGEAL DILATION  2003  . ESOPHAGOGASTRODUODENOSCOPY  2003   ERD, stricture dilated  . EXCISIONAL HEMORRHOIDECTOMY  ~ 2008  . LEFT HEART CATHETERIZATION WITH CORONARY ANGIOGRAM N/A 03/26/2014   Procedure: LEFT HEART CATHETERIZATION WITH CORONARY ANGIOGRAM;  Surgeon: Peter M Martinique, MD;  Location: [facility] CATH LAB;  Service: Cardiovascular;  Laterality: N/A;  . PERCUTANEOUS CORONARY STENT INTERVENTION (PCI-S) N/A 04/01/2014   Procedure: PERCUTANEOUS CORONARY STENT INTERVENTION (PCI-S);  Surgeon: Peter M Martinique, MD;  Location: [facility] CATH LAB;  Service: Cardiovascular;  Laterality: N/A;  . POSTERIOR LAMINECTOMY / DECOMPRESSION LUMBAR SPINE  12/2009   L4 , Dr Sherwood Gambler   Social History   Occupational History  . Occupation: Retired   Tobacco Use  . Smoking status: Never Smoker  . Smokeless tobacco: Never Used  Substance and Sexual Activity  . Alcohol use: Yes     Alcohol/week: 2.0 standard drinks    Types: 2 Cans of beer per week  . Drug use: No  . Sexual activity: Not Currently

## 2018-11-16 ENCOUNTER — Other Ambulatory Visit: Payer: Self-pay | Admitting: Cardiology

## 2018-11-16 ENCOUNTER — Telehealth: Payer: Self-pay

## 2018-11-16 NOTE — Telephone Encounter (Signed)
   [facility] Pre-operative Risk Assessment    Request for surgical clearance:  1. What type of surgery is being performed? Arthogram  2. When is this surgery scheduled? TBD   3. What type of clearance is required (medical clearance vs. Pharmacy clearance to hold med vs. Both)? Pharmacy  4. Are there any medications that need to be held prior to surgery and how long? Plavix; 5 days    5. Practice name and name of physician performing surgery? [facility]   6. What is your office phone number [phone]    7.   What is your office fax number [phone]  8.   Anesthesia type (None, local, MAC, general) ? Not listed   Jacqulynn Cadet 11/16/2018, 4:30 PM  _________________________________________________________________   (provider comments below)

## 2018-11-17 ENCOUNTER — Encounter: Payer: Self-pay | Admitting: Orthopedic Surgery

## 2018-11-20 NOTE — Telephone Encounter (Signed)
Ok to hold on mri but we will u sound then thx

## 2018-11-24 ENCOUNTER — Telehealth: Payer: Self-pay

## 2018-11-24 ENCOUNTER — Other Ambulatory Visit: Payer: Medicare Other

## 2018-11-24 DIAGNOSIS — Z20822 Contact with and (suspected) exposure to covid-19: Secondary | ICD-10-CM

## 2018-11-24 NOTE — Telephone Encounter (Signed)
Attempted call to patient for potential COVID-19 exposure. No answer left message requesting return call to [place] at [phone]

## 2018-11-24 NOTE — Addendum Note (Signed)
Addended by: Corky Sox E on: 11/24/2018 12:10 PM   Modules accepted: Orders

## 2018-11-26 ENCOUNTER — Ambulatory Visit: Payer: Medicare Other | Admitting: Orthopedic Surgery

## 2018-11-26 LAB — NOVEL CORONAVIRUS, NAA: SARS-CoV-2, NAA: NOT DETECTED

## 2018-12-13 ENCOUNTER — Ambulatory Visit (INDEPENDENT_AMBULATORY_CARE_PROVIDER_SITE_OTHER): Payer: Medicare Other | Admitting: Orthopedic Surgery

## 2018-12-13 ENCOUNTER — Other Ambulatory Visit: Payer: Self-pay

## 2018-12-13 ENCOUNTER — Encounter: Payer: Self-pay | Admitting: Orthopedic Surgery

## 2018-12-13 DIAGNOSIS — S46012A Strain of muscle(s) and tendon(s) of the rotator cuff of left shoulder, initial encounter: Secondary | ICD-10-CM

## 2018-12-13 DIAGNOSIS — I25118 Atherosclerotic heart disease of native coronary artery with other forms of angina pectoris: Secondary | ICD-10-CM | POA: Diagnosis not present

## 2018-12-14 ENCOUNTER — Encounter: Payer: Self-pay | Admitting: Orthopedic Surgery

## 2018-12-14 NOTE — Progress Notes (Signed)
Office Visit Note   Patient: [name] [name]           Date of Birth: 03-Apr-1951           MRN: [id number] Visit Date: 12/13/2018 Requested by: Vivi Barrack, MD [address] [place],  [place] [address] PCP: Vivi Barrack, MD  Subjective: Chief Complaint  Patient presents with  . Left Shoulder - Follow-up    HPI: [name] [name] is a patient with left shoulder pain.  Injured his shoulder in May playing golf.  When I saw him last clinic visit I thought he did have fairly significant weakness.  He has improved in that left shoulder.  He has been taking anti-inflammatory.  Still has pain but in general his shoulder has improved.  I recommended MRI scan based on exam last clinic visit but he has been for that yet.  He is interested in more of a home exercise program as opposed to formal physical therapy              ROS: All systems reviewed are negative as they relate to the chief complaint within the history of present illness.  Patient denies  fevers or chills.   Assessment & Plan: Visit Diagnoses:  1. 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  I looked at his shoulder with ultrasound today and I think he does have areas of hypo-echoic signal indicating at least partial tearing of the supraspinatus.  Subscap appears intact.  Plan at this time is for him to continue to rehab the shoulder I did provide some strengthening exercises if he is not able to play golf in 4 weeks he should come in and we will proceed with scanning at that time  Follow-Up Instructions: Return in about 6 weeks (around 01/24/2019).   Orders:  No orders of the defined types were placed in this encounter.  No orders of the defined types were placed in this encounter.     Procedures: No procedures performed   Clinical Data: No additional findings.  Objective:  Vital Signs: There were no vitals taken for this visit.  Physical Exam:   Constitutional: Patient appears well-developed HEENT:  Head: Normocephalic Eyes:EOM are normal Neck: Normal range of motion Cardiovascular: Normal rate Pulmonary/chest: Effort normal Neurologic: Patient is alert Skin: Skin is warm Psychiatric: Patient has normal mood and affect    Ortho Exam: Ortho exam demonstrates forward flexion abduction on the left both above 90 degrees.  Cuff strength is improved with external rotation is about 4+ out of 5 compared to 4- out of 5 previously.  Forward flexion strength also improved but there is some coarseness and grinding with internal/external rotation above 90 degrees of abduction.  Specialty Comments:  No specialty comments available.  Imaging: No results found.   PMFS History: Patient Active Problem List   Diagnosis Date Noted  . Diverticulosis of colon (without mention of hemorrhage) 04/25/2011  . External thrombosed hemorrhoids 04/25/2011  . Adenomatous colon polyp 03/17/2011  . CAD, NATIVE VESSEL 11/25/2009  . LOW BACK PAIN, CHRONIC 12/13/2007  . Hyperlipidemia associated with type 2 diabetes mellitus ([place]) 06/08/2007  . T2DM (type 2 diabetes mellitus) ([place]) 02/27/2007  . Hypertension associated with diabetes ([place]) 02/27/2007  . AMBLYOPIA 12/06/2006  . GERD 12/06/2006   Past Medical History:  Diagnosis Date  . Adenomatous colon polyp  09/07/2004  . Amblyopia   . CAD (coronary artery disease)    cardiac CT with a 50% LAD lesion in 2007  . Diverticulosis   . DM2 (diabetes mellitus, type 2) ([place])   . Elevated LFTs    secondary to labetalol  . Esophageal stricture 2003  . External hemorrhoids 2012   lanced by Dr Rise Patience  . Family history of anesthesia complication    "sister had PONV"  . GERD (gastroesophageal reflux disease)    esophageal stricture, pmh  . Hiatal hernia 01/04/00  . HLD (hyperlipidemia)   . HTN (hypertension)   .  Hypopotassemia     Family History  Problem Relation Age of Onset  . Heart attack Mother 28  . Stroke Mother 94  . Prostate cancer Father   . Diabetes Father   . Heart disease Father 21       CBAG   . Diabetes Brother   . Heart attack Brother   . Breast cancer Maternal Aunt   . Heart attack Maternal Grandfather 61  . Diabetes Paternal Grandmother   . Stroke Paternal Grandfather 4  . Heart disease Sister     Past Surgical History:  Procedure Laterality Date  . BACK SURGERY    . CARDIAC CATHETERIZATION  2007; 03/26/2014  . COLONOSCOPY  2006   tics , ADENOMATOUS polyps. F/U due 2011  . COLONOSCOPY  2012   Tics  . CORONARY ANGIOPLASTY WITH STENT PLACEMENT  04/01/2014   "2"  . ESOPHAGEAL DILATION  2003  . ESOPHAGOGASTRODUODENOSCOPY  2003   ERD, stricture dilated  . EXCISIONAL HEMORRHOIDECTOMY  ~ 2008  . LEFT HEART CATHETERIZATION WITH CORONARY ANGIOGRAM N/A 03/26/2014   Procedure: LEFT HEART CATHETERIZATION WITH CORONARY ANGIOGRAM;  Surgeon: Peter M Martinique, MD;  Location: [facility] LAB;  Service: Cardiovascular;  Laterality: N/A;  . PERCUTANEOUS CORONARY STENT INTERVENTION (PCI-S) N/A 04/01/2014   Procedure: PERCUTANEOUS CORONARY STENT INTERVENTION (PCI-S);  Surgeon: Peter M Martinique, MD;  Location: [facility] CATH LAB;  Service: Cardiovascular;  Laterality: N/A;  . POSTERIOR LAMINECTOMY / DECOMPRESSION LUMBAR SPINE  12/2009   L4 , Dr Sherwood Gambler   Social History   Occupational History  . Occupation: Retired   Tobacco Use  . Smoking status: Never Smoker  . Smokeless tobacco: Never Used  Substance and Sexual Activity  . Alcohol use: Yes    Alcohol/week: 2.0 standard drinks    Types: 2 Cans of beer per week  . Drug use: No  . Sexual activity: Not Currently

## 2018-12-26 ENCOUNTER — Other Ambulatory Visit: Payer: Self-pay | Admitting: Cardiology

## 2018-12-26 NOTE — Telephone Encounter (Signed)
  Patient is calling to check on his medical clearance so that he can get his Arthogram done. They are waiting on the clearance. See attached previous note from 11/16/18 for clearance form  Prentiss Bells [phone] if any questions

## 2018-12-26 NOTE — Telephone Encounter (Signed)
   Primary Cardiologist: Dr Martinique  Chart reviewed as part of pre-operative protocol coverage. Given past medical history and time since last visit, based on ACC/AHA guidelines, SHISHIR KRANTZ would be at acceptable risk for the planned procedure without further cardiovascular testing.   OK to hold Plavix 5 days pre op if needed.  I will route this recommendation to the requesting party via Epic fax function and remove from pre-op pool.  Please call with questions.  Kerin Ransom, PA-C 12/26/2018, 1:22 PM

## 2018-12-26 NOTE — Telephone Encounter (Signed)
Dr Martinique can you comment on holding Plavix before his arthrogram.  I'm not sure how it happened but this request was sent in May and was closed before it was addressed.   Kerin Ransom PA-C 12/26/2018 12:06 PM

## 2018-12-26 NOTE — Telephone Encounter (Signed)
Yes he can hold Plavix for the procedure  [name] MD, [facility]

## 2018-12-27 ENCOUNTER — Telehealth: Payer: Self-pay | Admitting: Orthopedic Surgery

## 2018-12-27 NOTE — Telephone Encounter (Signed)
Order changed.

## 2018-12-27 NOTE — Telephone Encounter (Signed)
Patient called to state that he is allergic to dye and was told by [facility] he may need to go to hospital and new order written.

## 2018-12-27 NOTE — Telephone Encounter (Signed)
I tried calling patient to advise Dr Marlou Sa ok'd scan without contrast. No answer. LMVM advising per Dr Marlou Sa. Can you please edit his scan order?

## 2018-12-27 NOTE — Telephone Encounter (Signed)
I talked with Dr Marlou Sa He is ok with patient having the scan without contrast I will call patient and advise and also get Sabrina or Brynn to update order.

## 2019-01-12 ENCOUNTER — Ambulatory Visit
Admission: RE | Admit: 2019-01-12 | Discharge: 2019-01-12 | Disposition: A | Discharge status: Home / Self Care | Payer: Medicare Other | Source: Ambulatory Visit | Attending: Orthopedic Surgery | Admitting: Orthopedic Surgery

## 2019-01-12 ENCOUNTER — Other Ambulatory Visit: Payer: Self-pay

## 2019-01-12 DIAGNOSIS — M25512 Pain in left shoulder: Secondary | ICD-10-CM

## 2019-01-12 DIAGNOSIS — M75122 Complete rotator cuff tear or rupture of left shoulder, not specified as traumatic: Secondary | ICD-10-CM | POA: Diagnosis not present

## 2019-01-16 ENCOUNTER — Other Ambulatory Visit: Payer: Self-pay

## 2019-01-16 ENCOUNTER — Ambulatory Visit (INDEPENDENT_AMBULATORY_CARE_PROVIDER_SITE_OTHER): Payer: Medicare Other | Admitting: Orthopedic Surgery

## 2019-01-16 ENCOUNTER — Encounter: Payer: Self-pay | Admitting: Orthopedic Surgery

## 2019-01-16 DIAGNOSIS — S46012A Strain of muscle(s) and tendon(s) of the rotator cuff of left shoulder, initial encounter: Secondary | ICD-10-CM

## 2019-01-16 DIAGNOSIS — I25118 Atherosclerotic heart disease of native coronary artery with other forms of angina pectoris: Secondary | ICD-10-CM

## 2019-01-16 NOTE — Progress Notes (Signed)
Office Visit Note   Patient: [name] [name]           Date of Birth: 1950/12/18           MRN: [id number] Visit Date: 01/16/2019 Requested by: Vivi Barrack, MD [address] [place],  [place] [address] PCP: Vivi Barrack, MD  Subjective: Chief Complaint  Patient presents with  . Follow-up    HPI: [name] [name] is a 68 y.o. male who presents to the office complaining of left shoulder pain. Pt injured his shoulder in May 2020 while playing golf.  His main complaint is pain with some weakness with heavy lifting or pushing.  Pain is localized to the left shoulder without radiation.  Patient denies any issues with the shoulder prior to injury. Denies any numbness or tingling.  The pain occasionally wakes him from sleep at night.   He notes a history of a right shoulder injury, that he suspects was a rotator cuff injury, 35 years ago.  No current pain or weakness of right shoulder.  History of 2 stent placements 4 years ago.  He takes Plavix and his cardiologist is Dr. Martinique.                ROS: All systems reviewed are negative as they relate to the chief complaint within the history of present illness.  Patient denies  fevers or chills.   Assessment & Plan: Visit Diagnoses: No diagnosis found.  Plan: Pt returns with rotator cuff injury following an incident playing golf 2 months ago.  He has not tried to play golf again for fear of re-injury.  His main goal is to return to playing and get rid of his pain. MRI reviewed today with patient reveals a large, full-thickness supraspinatus tear with retraction.  Discussed options including rotator cuff repair vs. Non-operative management.  Non-operative management would place increasing stress on the remaining RTC muscles with a future supraspinatus repair becoming very unlikely after 6 months to a year of time for the tendon to retract.  I recommended operative management if patient desires to return to playing golf.  Pt understands and chose to  opt for rotator cuff repair.  Pt will f/u after the procedure.    Follow-Up Instructions: No follow-ups on file.   Orders:  No orders of the defined types were placed in this encounter.  No orders of the defined types were placed in this encounter.     Procedures: No procedures performed   Clinical Data: No additional findings.  Objective: Vital Signs: There were no vitals taken for this visit.  Physical Exam:   Constitutional: Patient appears well-developed HEENT:  Head: Normocephalic Eyes:EOM are normal Neck: Normal range of motion Cardiovascular: Normal rate Pulmonary/chest: Effort normal Neurologic: Patient is alert Skin: Skin is warm Psychiatric: Patient has normal mood and affect    Ortho Exam:  L shoulder exam: Range of motion is good with full overhead motion from forward flexion and abduction.   5/5 subscapularis and infraspinatus strength 4+/5 supraspinatus strength, with pain 5/5 grip strength, forearm pronation/supination, bicep resistance TTP over the bicipital groove  Specialty Comments:  No specialty comments available.  Imaging: No results found.   PMFS History: Patient Active Problem List   Diagnosis Date Noted  . Diverticulosis of colon (without mention of hemorrhage) 04/25/2011  . External thrombosed hemorrhoids 04/25/2011  . Adenomatous colon polyp 03/17/2011  . CAD, NATIVE VESSEL 11/25/2009  . LOW BACK PAIN, CHRONIC 12/13/2007  . Hyperlipidemia associated  with type 2 diabetes mellitus ([place]) 06/08/2007  . T2DM (type 2 diabetes mellitus) ([place]) 02/27/2007  . Hypertension associated with diabetes ([place]) 02/27/2007  . AMBLYOPIA 12/06/2006  . GERD 12/06/2006   Past Medical History:  Diagnosis Date  . Adenomatous colon polyp 09/07/2004  . Amblyopia   . CAD (coronary artery disease)    cardiac CT with a 50% LAD lesion in 2007  . Diverticulosis   . DM2 (diabetes mellitus, type 2) ([place])   . Elevated LFTs    secondary to labetalol  .  Esophageal stricture 2003  . External hemorrhoids 2012   lanced by Dr Rise Patience  . Family history of anesthesia complication    "sister had PONV"  . GERD (gastroesophageal reflux disease)    esophageal stricture, pmh  . Hiatal hernia 01/04/00  . HLD (hyperlipidemia)   . HTN (hypertension)   . Hypopotassemia     Family History  Problem Relation Age of Onset  . Heart attack Mother 23  . Stroke Mother 73  . Prostate cancer Father   . Diabetes Father   . Heart disease Father 39       CBAG   . Diabetes Brother   . Heart attack Brother   . Breast cancer Maternal Aunt   . Heart attack Maternal Grandfather 61  . Diabetes Paternal Grandmother   . Stroke Paternal Grandfather 95  . Heart disease Sister     Past Surgical History:  Procedure Laterality Date  . BACK SURGERY    . CARDIAC CATHETERIZATION  2007; 03/26/2014  . COLONOSCOPY  2006   tics , ADENOMATOUS polyps. F/U due 2011  . COLONOSCOPY  2012   Tics  . CORONARY ANGIOPLASTY WITH STENT PLACEMENT  04/01/2014   "2"  . ESOPHAGEAL DILATION  2003  . ESOPHAGOGASTRODUODENOSCOPY  2003   ERD, stricture dilated  . EXCISIONAL HEMORRHOIDECTOMY  ~ 2008  . LEFT HEART CATHETERIZATION WITH CORONARY ANGIOGRAM N/A 03/26/2014   Procedure: LEFT HEART CATHETERIZATION WITH CORONARY ANGIOGRAM;  Surgeon: Peter M Martinique, MD;  Location: [facility] CATH LAB;  Service: Cardiovascular;  Laterality: N/A;  . PERCUTANEOUS CORONARY STENT INTERVENTION (PCI-S) N/A 04/01/2014   Procedure: PERCUTANEOUS CORONARY STENT INTERVENTION (PCI-S);  Surgeon: Peter M Martinique, MD;  Location: [facility] CATH LAB;  Service: Cardiovascular;  Laterality: N/A;  . POSTERIOR LAMINECTOMY / DECOMPRESSION LUMBAR SPINE  12/2009   L4 , Dr Sherwood Gambler   Social History   Occupational History  . Occupation: Retired   Tobacco Use  . Smoking status: Never Smoker  . Smokeless tobacco: Never Used  Substance and Sexual Activity  . Alcohol use: Yes    Alcohol/week: 2.0 standard drinks    Types: 2 Cans of  beer per week  . Drug use: No  . Sexual activity: Not Currently

## 2019-01-17 ENCOUNTER — Encounter: Payer: Self-pay | Admitting: Orthopedic Surgery

## 2019-01-24 ENCOUNTER — Telehealth: Payer: Self-pay

## 2019-01-24 NOTE — Telephone Encounter (Signed)
   [facility] Pre-operative Risk Assessment    Request for surgical clearance:  1. What type of surgery is being performed?   LEFT SHOULDER ARTHROSCOPY, BICEPS TENODESIS, MINI OPEN ROTATOR CUFF TEAR REPAIR  2. When is this surgery scheduled?  02-04-2019   3. What type of clearance is required (medical clearance vs. Pharmacy clearance to hold med vs. Both)? BOTH  4. Are there any medications that need to be held prior to surgery and how long? PLAVIX   5. Practice name and name of physician performing surgery? [facility] Suzanna Obey, MD  6. What is your office phone number  [phone]    7.   What is your office fax number  [phone]  8.   Anesthesia type (None, local, MAC, general) ?  [place] 01/24/2019, 10:24 AM  _________________________________________________________________   (provider comments below)

## 2019-01-24 NOTE — Telephone Encounter (Signed)
   Primary Cardiologist: [name], MD  Chart reviewed as part of pre-operative protocol coverage. Patient was contacted 01/24/2019 in reference to pre-operative risk assessment for pending surgery as outlined below.  [name] [name] was last seen on 08/2018 by DR. Martinique, with history of CAD and DES to LAD in 03/2014, severe IV contrast allergy, DM, diverticulosis, esophageal stricture, elevated LFTs, HTN, HLD, hemorrhoids. Last PCI was in 2015. He is on Plavix long term since he is aspirin allergic.  RCRI 0.9% indicating low risk of CV complications. I spoke with patient and he is doing well without any cardiac symptoms, able to exceed 4 METS without angina. No concerning symptoms of dyspnea, palpitations or syncope reported. Therefore, based on ACC/AHA guidelines, the patient would be at acceptable risk for the planned procedure without further cardiovascular testing.   There was a recent clearance that came through that Dr. Martinique granted clearance for the patient to hold Plavix for 5 days. Given no interim change in medical history, the same recommendation would apply here. We typically advise that blood thinners be resumed when felt safe by performing physician.  I will route this recommendation to the requesting party via Epic fax function and remove from pre-op pool.  Please call with questions.  [name], PA-C 01/24/2019, 10:37 AM

## 2019-01-25 ENCOUNTER — Encounter (HOSPITAL_BASED_OUTPATIENT_CLINIC_OR_DEPARTMENT_OTHER): Payer: Self-pay | Admitting: *Deleted

## 2019-01-25 ENCOUNTER — Other Ambulatory Visit: Payer: Self-pay

## 2019-01-31 ENCOUNTER — Other Ambulatory Visit: Payer: Self-pay

## 2019-01-31 ENCOUNTER — Other Ambulatory Visit (HOSPITAL_COMMUNITY)
Admission: RE | Admit: 2019-01-31 | Discharge: 2019-01-31 | Disposition: A | Discharge status: Home / Self Care | Payer: Medicare Other | Source: Ambulatory Visit | Attending: Orthopedic Surgery | Admitting: Orthopedic Surgery

## 2019-01-31 ENCOUNTER — Encounter (HOSPITAL_BASED_OUTPATIENT_CLINIC_OR_DEPARTMENT_OTHER)
Admission: RE | Admit: 2019-01-31 | Discharge: 2019-01-31 | Disposition: A | Discharge status: Home / Self Care | Payer: Medicare Other | Source: Ambulatory Visit | Attending: Orthopedic Surgery | Admitting: Orthopedic Surgery

## 2019-01-31 ENCOUNTER — Telehealth: Payer: Self-pay | Admitting: Orthopedic Surgery

## 2019-01-31 DIAGNOSIS — Z20828 Contact with and (suspected) exposure to other viral communicable diseases: Secondary | ICD-10-CM | POA: Diagnosis not present

## 2019-01-31 DIAGNOSIS — Z01812 Encounter for preprocedural laboratory examination: Secondary | ICD-10-CM | POA: Diagnosis not present

## 2019-01-31 LAB — CBC
HCT: 42 % (ref 39.0–52.0)
Hemoglobin: 14.6 g/dL (ref 13.0–17.0)
MCH: 33.6 pg (ref 26.0–34.0)
MCHC: 34.8 g/dL (ref 30.0–36.0)
MCV: 96.8 fL (ref 80.0–100.0)
Platelets: 231 10*3/uL (ref 150–400)
RBC: 4.34 MIL/uL (ref 4.22–5.81)
RDW: 13 % (ref 11.5–15.5)
WBC: 7.5 10*3/uL (ref 4.0–10.5)
nRBC: 0 % (ref 0.0–0.2)

## 2019-01-31 LAB — BASIC METABOLIC PANEL
Anion gap: 9 (ref 5–15)
BUN: 9 mg/dL (ref 8–23)
CO2: 25 mmol/L (ref 22–32)
Calcium: 9.2 mg/dL (ref 8.9–10.3)
Chloride: 106 mmol/L (ref 98–111)
Creatinine, Ser: 0.93 mg/dL (ref 0.61–1.24)
GFR calc Af Amer: >60 mL/min (ref >60)
GFR calc non Af Amer: >60 mL/min (ref >60)
Glucose, Bld: 128 mg/dL — ABNORMAL HIGH (ref 70–99)
Potassium: 4.6 mmol/L (ref 3.5–5.1)
Sodium: 140 mmol/L (ref 135–145)

## 2019-01-31 LAB — SARS CORONAVIRUS 2 (TAT 6-24 HRS): SARS Coronavirus 2: NEGATIVE

## 2019-01-31 NOTE — Telephone Encounter (Signed)
I am sure he's probably talking about a CPM machine, would you mind  Calling him about this?

## 2019-01-31 NOTE — Telephone Encounter (Signed)
After calling Ruby Cola with [employer], I spoke with patient in reference to the CPM for the shoulder and informed him that his set up would take place tomorrow.  Although medicare is not going to pay for the CPM, he will opt to have [place] deliver and pay out of pocket.  He would also like to discuss pain medication for his recovery.  He states he's unable to tolerate oxycodone, has taken in the past and made him sick on his stomach.  Patient is scheduled for left shoulder scope/biceps tenodesis/mini open rotator cuff repair at [facility]  August 10th   Pt's cb [phone]

## 2019-01-31 NOTE — Telephone Encounter (Signed)
Pt called in said he would like a call from [place] by the end of today said its inreference to his machine that he's suppose to have for after his procedure on Monday with dr.dean  [phone]

## 2019-02-01 NOTE — Telephone Encounter (Signed)
IC s/w pt and advised per Dr Marlou Sa

## 2019-02-01 NOTE — Telephone Encounter (Signed)
Please advise. Thanks.  

## 2019-02-01 NOTE — Telephone Encounter (Signed)
With that we will use Norco and Robaxin for his postop pain relief

## 2019-02-04 ENCOUNTER — Encounter (HOSPITAL_BASED_OUTPATIENT_CLINIC_OR_DEPARTMENT_OTHER): Payer: Self-pay | Admitting: Certified Registered Nurse Anesthetist

## 2019-02-04 ENCOUNTER — Other Ambulatory Visit: Payer: Self-pay

## 2019-02-04 ENCOUNTER — Ambulatory Visit (HOSPITAL_BASED_OUTPATIENT_CLINIC_OR_DEPARTMENT_OTHER): Payer: Medicare Other | Admitting: Certified Registered Nurse Anesthetist

## 2019-02-04 ENCOUNTER — Ambulatory Visit (HOSPITAL_BASED_OUTPATIENT_CLINIC_OR_DEPARTMENT_OTHER)
Admission: RE | Admit: 2019-02-04 | Discharge: 2019-02-04 | Disposition: A | Discharge status: Home / Self Care | Payer: Medicare Other | Attending: Orthopedic Surgery | Admitting: Orthopedic Surgery

## 2019-02-04 ENCOUNTER — Encounter (HOSPITAL_BASED_OUTPATIENT_CLINIC_OR_DEPARTMENT_OTHER)
Admission: RE | Disposition: A | Discharge status: Home / Self Care | Payer: Self-pay | Source: Home / Self Care | Attending: Orthopedic Surgery

## 2019-02-04 DIAGNOSIS — E785 Hyperlipidemia, unspecified: Secondary | ICD-10-CM | POA: Diagnosis not present

## 2019-02-04 DIAGNOSIS — M75102 Unspecified rotator cuff tear or rupture of left shoulder, not specified as traumatic: Secondary | ICD-10-CM | POA: Diagnosis not present

## 2019-02-04 DIAGNOSIS — I10 Essential (primary) hypertension: Secondary | ICD-10-CM | POA: Diagnosis not present

## 2019-02-04 DIAGNOSIS — S43432A Superior glenoid labrum lesion of left shoulder, initial encounter: Secondary | ICD-10-CM | POA: Insufficient documentation

## 2019-02-04 DIAGNOSIS — M7522 Bicipital tendinitis, left shoulder: Secondary | ICD-10-CM | POA: Diagnosis not present

## 2019-02-04 DIAGNOSIS — S46112S Strain of muscle, fascia and tendon of long head of biceps, left arm, sequela: Secondary | ICD-10-CM

## 2019-02-04 DIAGNOSIS — G8918 Other acute postprocedural pain: Secondary | ICD-10-CM | POA: Diagnosis not present

## 2019-02-04 DIAGNOSIS — Z79899 Other long term (current) drug therapy: Secondary | ICD-10-CM | POA: Diagnosis not present

## 2019-02-04 DIAGNOSIS — S46012A Strain of muscle(s) and tendon(s) of the rotator cuff of left shoulder, initial encounter: Secondary | ICD-10-CM | POA: Diagnosis not present

## 2019-02-04 DIAGNOSIS — E119 Type 2 diabetes mellitus without complications: Secondary | ICD-10-CM | POA: Diagnosis not present

## 2019-02-04 DIAGNOSIS — X509XXA Other and unspecified overexertion or strenuous movements or postures, initial encounter: Secondary | ICD-10-CM | POA: Insufficient documentation

## 2019-02-04 DIAGNOSIS — Z7984 Long term (current) use of oral hypoglycemic drugs: Secondary | ICD-10-CM | POA: Diagnosis not present

## 2019-02-04 DIAGNOSIS — M75122 Complete rotator cuff tear or rupture of left shoulder, not specified as traumatic: Secondary | ICD-10-CM

## 2019-02-04 DIAGNOSIS — I2510 Atherosclerotic heart disease of native coronary artery without angina pectoris: Secondary | ICD-10-CM | POA: Diagnosis not present

## 2019-02-04 DIAGNOSIS — Z7902 Long term (current) use of antithrombotics/antiplatelets: Secondary | ICD-10-CM | POA: Diagnosis not present

## 2019-02-04 DIAGNOSIS — Z955 Presence of coronary angioplasty implant and graft: Secondary | ICD-10-CM | POA: Diagnosis not present

## 2019-02-04 DIAGNOSIS — Y9389 Activity, other specified: Secondary | ICD-10-CM | POA: Insufficient documentation

## 2019-02-04 HISTORY — PX: SHOULDER ARTHROSCOPY WITH ROTATOR CUFF REPAIR AND OPEN BICEPS TENODESIS: SHX6677

## 2019-02-04 LAB — GLUCOSE, CAPILLARY
Glucose-Capillary: 119 mg/dL — ABNORMAL HIGH (ref 70–99)
Glucose-Capillary: 104 mg/dL — ABNORMAL HIGH (ref 70–99)

## 2019-02-04 SURGERY — SHOULDER ARTHROSCOPY WITH ROTATOR CUFF REPAIR AND OPEN BICEPS TENODESIS
Anesthesia: General | Site: Shoulder | Laterality: Left

## 2019-02-04 MED ORDER — CHLORHEXIDINE GLUCONATE 4 % EX LIQD: 60.0000 mL | Freq: Once | CUTANEOUS | Status: DC

## 2019-02-04 MED ORDER — MIDAZOLAM HCL 2 MG/2ML IJ SOLN
INTRAMUSCULAR | Status: AC
  Filled 2019-02-04: qty 2

## 2019-02-04 MED ORDER — SCOPOLAMINE 1 MG/3DAYS TD PT72: 1.0000 | MEDICATED_PATCH | Freq: Once | TRANSDERMAL | Status: DC

## 2019-02-04 MED ORDER — OXYCODONE HCL 5 MG/5ML PO SOLN: 5.0000 mg | Freq: Once | ORAL | Status: DC | PRN

## 2019-02-04 MED ORDER — MIDAZOLAM HCL 2 MG/2ML IJ SOLN
1.0000 mg | INTRAMUSCULAR | Status: DC | PRN
  Administered 2019-02-04: 2 mg via INTRAVENOUS

## 2019-02-04 MED ORDER — PHENYLEPHRINE HCL (PRESSORS) 10 MG/ML IV SOLN
INTRAVENOUS | Status: DC | PRN
  Administered 2019-02-04 (×2): 120 ug via INTRAVENOUS
  Administered 2019-02-04 (×2): 80 ug via INTRAVENOUS

## 2019-02-04 MED ORDER — SODIUM CHLORIDE 0.9 % IR SOLN
Status: DC | PRN
  Administered 2019-02-04: 6000 mL

## 2019-02-04 MED ORDER — ONDANSETRON HCL 4 MG/2ML IJ SOLN
INTRAMUSCULAR | Status: AC
  Filled 2019-02-04: qty 2

## 2019-02-04 MED ORDER — BUPIVACAINE LIPOSOME 1.3 % IJ SUSP
INTRAMUSCULAR | Status: DC | PRN
  Administered 2019-02-04: 10 mL via PERINEURAL

## 2019-02-04 MED ORDER — FENTANYL CITRATE (PF) 100 MCG/2ML IJ SOLN
50.0000 ug | INTRAMUSCULAR | Status: DC | PRN
  Administered 2019-02-04: 100 ug via INTRAVENOUS

## 2019-02-04 MED ORDER — GLYCOPYRROLATE 0.2 MG/ML IJ SOLN
INTRAMUSCULAR | Status: DC | PRN
  Administered 2019-02-04: 0.2 mg via INTRAVENOUS

## 2019-02-04 MED ORDER — PROPOFOL 10 MG/ML IV BOLUS
INTRAVENOUS | Status: DC | PRN
  Administered 2019-02-04: 175 mg via INTRAVENOUS

## 2019-02-04 MED ORDER — ARTIFICIAL TEARS OPHTHALMIC OINT
TOPICAL_OINTMENT | OPHTHALMIC | Status: AC
  Filled 2019-02-04: qty 3.5

## 2019-02-04 MED ORDER — METHOCARBAMOL 500 MG PO TABS: 500.0000 mg | ORAL_TABLET | Freq: Three times a day (TID) | ORAL | 0 refills | Status: DC | PRN

## 2019-02-04 MED ORDER — ONDANSETRON HCL 4 MG/2ML IJ SOLN: 4.0000 mg | Freq: Four times a day (QID) | INTRAMUSCULAR | Status: DC | PRN

## 2019-02-04 MED ORDER — OXYCODONE HCL 5 MG PO TABS: 5.0000 mg | ORAL_TABLET | Freq: Once | ORAL | Status: DC | PRN

## 2019-02-04 MED ORDER — LACTATED RINGERS IV SOLN
INTRAVENOUS | Status: DC
  Administered 2019-02-04 (×2): via INTRAVENOUS

## 2019-02-04 MED ORDER — LIDOCAINE 2% (20 MG/ML) 5 ML SYRINGE
INTRAMUSCULAR | Status: AC
  Filled 2019-02-04: qty 5

## 2019-02-04 MED ORDER — FENTANYL CITRATE (PF) 100 MCG/2ML IJ SOLN
INTRAMUSCULAR | Status: AC
  Filled 2019-02-04: qty 2

## 2019-02-04 MED ORDER — ROCURONIUM BROMIDE 100 MG/10ML IV SOLN
INTRAVENOUS | Status: DC | PRN
  Administered 2019-02-04: 50 mg via INTRAVENOUS

## 2019-02-04 MED ORDER — SUGAMMADEX SODIUM 200 MG/2ML IV SOLN
INTRAVENOUS | Status: DC | PRN
  Administered 2019-02-04: 200 mg via INTRAVENOUS

## 2019-02-04 MED ORDER — GLYCOPYRROLATE PF 0.2 MG/ML IJ SOSY
PREFILLED_SYRINGE | INTRAMUSCULAR | Status: AC
  Filled 2019-02-04: qty 1

## 2019-02-04 MED ORDER — ARTIFICIAL TEARS OPHTHALMIC OINT
TOPICAL_OINTMENT | OPHTHALMIC | Status: DC | PRN
  Administered 2019-02-04: 1 via OPHTHALMIC

## 2019-02-04 MED ORDER — CEFAZOLIN SODIUM-DEXTROSE 2-4 GM/100ML-% IV SOLN
2.0000 g | INTRAVENOUS | Status: AC
  Administered 2019-02-04: 2 g via INTRAVENOUS

## 2019-02-04 MED ORDER — FENTANYL CITRATE (PF) 100 MCG/2ML IJ SOLN
INTRAMUSCULAR | Status: DC | PRN
  Administered 2019-02-04 (×2): 50 ug via INTRAVENOUS

## 2019-02-04 MED ORDER — DEXAMETHASONE SODIUM PHOSPHATE 10 MG/ML IJ SOLN
INTRAMUSCULAR | Status: AC
  Filled 2019-02-04: qty 1

## 2019-02-04 MED ORDER — ONDANSETRON HCL 4 MG/2ML IJ SOLN
INTRAMUSCULAR | Status: DC | PRN
  Administered 2019-02-04: 4 mg via INTRAVENOUS

## 2019-02-04 MED ORDER — CEFAZOLIN SODIUM-DEXTROSE 2-4 GM/100ML-% IV SOLN
INTRAVENOUS | Status: AC
  Filled 2019-02-04: qty 100

## 2019-02-04 MED ORDER — PHENYLEPHRINE 40 MCG/ML (10ML) SYRINGE FOR IV PUSH (FOR BLOOD PRESSURE SUPPORT)
PREFILLED_SYRINGE | INTRAVENOUS | Status: AC
  Filled 2019-02-04: qty 10

## 2019-02-04 MED ORDER — PROPOFOL 10 MG/ML IV BOLUS
INTRAVENOUS | Status: AC
  Filled 2019-02-04: qty 20

## 2019-02-04 MED ORDER — EPHEDRINE 5 MG/ML INJ
INTRAVENOUS | Status: AC
  Filled 2019-02-04: qty 10

## 2019-02-04 MED ORDER — EPHEDRINE SULFATE 50 MG/ML IJ SOLN
INTRAMUSCULAR | Status: DC | PRN
  Administered 2019-02-04 (×2): 10 mg via INTRAVENOUS
  Administered 2019-02-04 (×2): 5 mg via INTRAVENOUS

## 2019-02-04 MED ORDER — OXYCODONE HCL 5 MG PO TABS: 5.0000 mg | ORAL_TABLET | Freq: Four times a day (QID) | ORAL | 0 refills | Status: DC | PRN

## 2019-02-04 MED ORDER — LIDOCAINE HCL (CARDIAC) PF 100 MG/5ML IV SOSY
PREFILLED_SYRINGE | INTRAVENOUS | Status: DC | PRN
  Administered 2019-02-04: 50 mg via INTRATRACHEAL

## 2019-02-04 MED ORDER — FENTANYL CITRATE (PF) 100 MCG/2ML IJ SOLN: 25.0000 ug | INTRAMUSCULAR | Status: DC | PRN

## 2019-02-04 MED ORDER — EPINEPHRINE PF 1 MG/ML IJ SOLN
INTRAMUSCULAR | Status: DC | PRN
  Administered 2019-02-04: 2 mL

## 2019-02-04 MED ORDER — BUPIVACAINE HCL (PF) 0.5 % IJ SOLN
INTRAMUSCULAR | Status: DC | PRN
  Administered 2019-02-04: 15 mL via PERINEURAL

## 2019-02-04 SURGICAL SUPPLY — 101 items
AID PSTN UNV HD RSTRNT DISP (MISCELLANEOUS) ×1
ANCH SUT CRKSW FT 1.3X (Anchor) ×2 IMPLANT
ANCH SUT PUSHLCK 24X4.5 STRL (Orthopedic Implant) ×3 IMPLANT
ANCH SUT SWLK 19.1X5.5 CLS EL (Anchor) ×1 IMPLANT
ANCHOR PEEK SWIVEL LOCK 5.5 (Anchor) ×1 IMPLANT
ANCHOR SUT BIOCOMP CORKSREW (Anchor) ×4 IMPLANT
APL PRP STRL LF DISP 70% ISPRP (MISCELLANEOUS) ×1
BLADE 15 SAFETY STRL DISP (BLADE) ×2 IMPLANT
BLADE EXCALIBUR 4.0X13 (MISCELLANEOUS) IMPLANT
BLADE SHAVER BONE 5.0X13 (MISCELLANEOUS) IMPLANT
BLADE SURG 10 STRL SS (BLADE) IMPLANT
BLADE SURG 15 STRL LF DISP TIS (BLADE) ×1 IMPLANT
BLADE SURG 15 STRL SS (BLADE) ×2
BURR OVAL 8 FLU 5.0X13 (MISCELLANEOUS) IMPLANT
CHLORAPREP W/TINT 26 (MISCELLANEOUS) ×2 IMPLANT
CLEANER CAUTERY TIP 5X5 PAD (MISCELLANEOUS) IMPLANT
COVER WAND RF STERILE (DRAPES) IMPLANT
DECANTER SPIKE VIAL GLASS SM (MISCELLANEOUS) IMPLANT
DISSECTOR  3.8MM X 13CM (MISCELLANEOUS)
DISSECTOR 3.8MM X 13CM (MISCELLANEOUS) IMPLANT
DISSECTOR 4.0MM X 13CM (MISCELLANEOUS) IMPLANT
DRAPE HALF SHEET 70X43 (DRAPES) IMPLANT
DRAPE INCISE IOBAN 66X45 STRL (DRAPES) ×4 IMPLANT
DRAPE STERI 35X30 U-POUCH (DRAPES) ×1 IMPLANT
DRAPE U-SHAPE 47X51 STRL (DRAPES) ×6 IMPLANT
DRSG PAD ABDOMINAL 8X10 ST (GAUZE/BANDAGES/DRESSINGS) ×2 IMPLANT
DRSG TEGADERM 4X4.75 (GAUZE/BANDAGES/DRESSINGS) ×4 IMPLANT
DW OUTFLOW CASSETTE/TUBE SET (MISCELLANEOUS) ×2 IMPLANT
ELECT MENISCUS 165MM 90D (ELECTRODE) IMPLANT
ELECT NEEDLE TIP 2.8 STRL (NEEDLE) IMPLANT
ELECT REM PT RETURN 9FT ADLT (ELECTROSURGICAL) ×2
ELECTRODE REM PT RTRN 9FT ADLT (ELECTROSURGICAL) ×1 IMPLANT
EXCALIBUR 3.8MM X 13CM (MISCELLANEOUS) IMPLANT
GAUZE 4X4 16PLY RFD (DISPOSABLE) IMPLANT
GAUZE SPONGE 4X4 12PLY STRL (GAUZE/BANDAGES/DRESSINGS) ×2 IMPLANT
GAUZE XEROFORM 1X8 LF (GAUZE/BANDAGES/DRESSINGS) ×2 IMPLANT
GLOVE BIO SURGEON STRL SZ7 (GLOVE) ×2 IMPLANT
GLOVE BIOGEL PI IND STRL 7.0 (GLOVE) ×1 IMPLANT
GLOVE BIOGEL PI IND STRL 8 (GLOVE) ×1 IMPLANT
GLOVE BIOGEL PI INDICATOR 7.0 (GLOVE) ×1
GLOVE BIOGEL PI INDICATOR 8 (GLOVE) ×1
GLOVE SURG ORTHO 8.0 STRL STRW (GLOVE) ×2 IMPLANT
GOWN STRL REUS W/ TWL LRG LVL3 (GOWN DISPOSABLE) ×3 IMPLANT
GOWN STRL REUS W/TWL LRG LVL3 (GOWN DISPOSABLE) ×6
KIT BIO-TENODESIS 3X8 DISP (MISCELLANEOUS) ×2
KIT INSRT BABSR STRL DISP BTN (MISCELLANEOUS) IMPLANT
MANIFOLD NEPTUNE II (INSTRUMENTS) ×2 IMPLANT
NDL SAFETY ECLIPSE 18X1.5 (NEEDLE) ×2 IMPLANT
NDL SUT 6 .5 CRC .975X.05 MAYO (NEEDLE) IMPLANT
NEEDLE HYPO 18GX1.5 SHARP (NEEDLE) ×4
NEEDLE MAYO TAPER (NEEDLE)
NEEDLE SCORPION MULTI FIRE (NEEDLE) ×2 IMPLANT
NS IRRIG 1000ML POUR BTL (IV SOLUTION) IMPLANT
PACK ARTHROSCOPY DSU (CUSTOM PROCEDURE TRAY) ×2 IMPLANT
PACK BASIN DAY SURGERY FS (CUSTOM PROCEDURE TRAY) ×2 IMPLANT
PAD CLEANER CAUTERY TIP 5X5 (MISCELLANEOUS)
PENCIL BUTTON HOLSTER BLD 10FT (ELECTRODE) ×3 IMPLANT
PORT APPOLLO RF 90DEGREE MULTI (SURGICAL WAND) ×2 IMPLANT
PUSHLOCK PEEK 4.5X24 (Orthopedic Implant) ×3 IMPLANT
RESTRAINT HEAD UNIVERSAL NS (MISCELLANEOUS) ×2 IMPLANT
SLEEVE SCD COMPRESS KNEE MED (MISCELLANEOUS) ×2 IMPLANT
SLING ARM FOAM STRAP LRG (SOFTGOODS) IMPLANT
SLING ARM IMMOBILIZER LRG (SOFTGOODS) ×1 IMPLANT
SLING ARM IMMOBILIZER MED (SOFTGOODS) IMPLANT
SLING ARM MED ADULT FOAM STRAP (SOFTGOODS) IMPLANT
SLING ARM XL FOAM STRAP (SOFTGOODS) IMPLANT
SPONGE LAP 4X18 RFD (DISPOSABLE) ×2 IMPLANT
SPONGE SURGIFOAM ABS GEL 12-7 (HEMOSTASIS) IMPLANT
STAPLER VISISTAT 35W (STAPLE) IMPLANT
STRIP CLOSURE SKIN 1/2X4 (GAUZE/BANDAGES/DRESSINGS) ×2 IMPLANT
SUCTION FRAZIER HANDLE 10FR (MISCELLANEOUS) ×1
SUCTION TUBE FRAZIER 10FR DISP (MISCELLANEOUS) ×1 IMPLANT
SUT BONE WAX W31G (SUTURE) IMPLANT
SUT ETHIBOND 2 OS 4 DA (SUTURE) IMPLANT
SUT ETHILON 3 0 PS 1 (SUTURE) ×2 IMPLANT
SUT ETHILON 4 0 PS 2 18 (SUTURE) IMPLANT
SUT FIBERWIRE #2 38 T-5 BLUE (SUTURE)
SUT FIBERWIRE 2-0 18 17.9 3/8 (SUTURE)
SUT MNCRL AB 3-0 PS2 18 (SUTURE) IMPLANT
SUT PDS AB 0 CT 36 (SUTURE) IMPLANT
SUT TICRON 1 T 12 (SUTURE) IMPLANT
SUT VIC AB 0 CT1 18XCR BRD 8 (SUTURE) ×1 IMPLANT
SUT VIC AB 0 CT1 27 (SUTURE)
SUT VIC AB 0 CT1 27XBRD ANBCTR (SUTURE) IMPLANT
SUT VIC AB 0 CT1 8-18 (SUTURE) ×2
SUT VIC AB 1 CT1 27 (SUTURE)
SUT VIC AB 1 CT1 27XBRD ANBCTR (SUTURE) IMPLANT
SUT VIC AB 2-0 SH 27 (SUTURE)
SUT VIC AB 2-0 SH 27XBRD (SUTURE) IMPLANT
SUT VICRYL 0 UR6 27IN ABS (SUTURE) ×5 IMPLANT
SUT VICRYL 4-0 PS2 18IN ABS (SUTURE) IMPLANT
SUTURE FIBERWR #2 38 T-5 BLUE (SUTURE) IMPLANT
SUTURE FIBERWR 2-0 18 17.9 3/8 (SUTURE) IMPLANT
SYR 5ML LL (SYRINGE) IMPLANT
SYR BULB 3OZ (MISCELLANEOUS) ×2 IMPLANT
TAPE STRIPS DRAPE STRL (GAUZE/BANDAGES/DRESSINGS) ×2 IMPLANT
TOWEL GREEN STERILE FF (TOWEL DISPOSABLE) ×2 IMPLANT
TUBE CONNECTING 20X1/4 (TUBING) ×2 IMPLANT
TUBING ARTHROSCOPY IRRIG 16FT (MISCELLANEOUS) ×2 IMPLANT
WATER STERILE IRR 1000ML POUR (IV SOLUTION) ×2 IMPLANT
YANKAUER SUCT BULB TIP NO VENT (SUCTIONS) ×2 IMPLANT

## 2019-02-04 NOTE — Progress Notes (Signed)
Assisted Dr. Hodierne with left, ultrasound guided, interscalene  block. Side rails up, monitors on throughout procedure. See vital signs in flow sheet. Tolerated Procedure well. 

## 2019-02-04 NOTE — Transfer of Care (Signed)
Immediate Anesthesia Transfer of Care Note  Patient: [name] [name]  Procedure(s) Performed: LEFT SHOULDER ARTHROSCOPY, BICEPS TENODESIS, MINI OPEN ROTATOR CUFF TEAR REPAIR (Left Shoulder)  Patient Location: PACU  Anesthesia Type:General and Regional  Level of Consciousness: awake, alert  and oriented  Airway & Oxygen Therapy: Patient Spontanous Breathing and Patient connected to face mask oxygen  Post-op Assessment: Report given to RN, Post -op Vital signs reviewed and stable, Patient moving all extremities X 4 and Patient able to stick tongue midline  Post vital signs: Reviewed and stable  Last Vitals:  Vitals Value Taken Time  BP 169/112 02/04/19 1300  Temp    Pulse 65 02/04/19 1301  Resp 14 02/04/19 1301  SpO2 100 % 02/04/19 1301  Vitals shown include unvalidated device data.  Last Pain:  Vitals:   02/04/19 0926  PainSc: 3          Complications: No apparent anesthesia complications

## 2019-02-04 NOTE — Discharge Instructions (Signed)
Post Anesthesia Home Care Instructions  Activity: Get plenty of rest for the remainder of the day. A responsible individual must stay with you for 24 hours following the procedure.  For the next 24 hours, DO NOT: -Drive a car -Operate machinery -Drink alcoholic beverages -Take any medication unless instructed by your physician -Make any legal decisions or sign important papers.  Meals: Start with liquid foods such as gelatin or soup. Progress to regular foods as tolerated. Avoid greasy, spicy, heavy foods. If nausea and/or vomiting occur, drink only clear liquids until the nausea and/or vomiting subsides. Call your physician if vomiting continues.  Special Instructions/Symptoms: Your throat may feel dry or sore from the anesthesia or the breathing tube placed in your throat during surgery. If this causes discomfort, gargle with warm salt water. The discomfort should disappear within 24 hours.  If you had a scopolamine patch placed behind your ear for the management of post- operative nausea and/or vomiting:  1. The medication in the patch is effective for 72 hours, after which it should be removed.  Wrap patch in a tissue and discard in the trash. Wash hands thoroughly with soap and water. 2. You may remove the patch earlier than 72 hours if you experience unpleasant side effects which may include dry mouth, dizziness or visual disturbances. 3. Avoid touching the patch. Wash your hands with soap and water after contact with the patch.      Regional Anesthesia Blocks  1. Numbness or the inability to move the "blocked" extremity may last from 3-48 hours after placement. The length of time depends on the medication injected and your individual response to the medication. If the numbness is not going away after 48 hours, call your surgeon.  2. The extremity that is blocked will need to be protected until the numbness is gone and the  Strength has returned. Because you cannot feel it, you  will need to take extra care to avoid injury. Because it may be weak, you may have difficulty moving it or using it. You may not know what position it is in without looking at it while the block is in effect.  3. For blocks in the legs and feet, returning to weight bearing and walking needs to be done carefully. You will need to wait until the numbness is entirely gone and the strength has returned. You should be able to move your leg and foot normally before you try and bear weight or walk. You will need someone to be with you when you first try to ensure you do not fall and possibly risk injury.  4. Bruising and tenderness at the needle site are common side effects and will resolve in a few days.  5. Persistent numbness or new problems with movement should be communicated to the surgeon or the [facility] ([phone])/ [facility] ([phone]).  Information for Discharge Teaching: EXPAREL (bupivacaine liposome injectable suspension)   Your surgeon or anesthesiologist gave you EXPAREL(bupivacaine) to help control your pain after surgery.   EXPAREL is a local anesthetic that provides pain relief by numbing the tissue around the surgical site.  EXPAREL is designed to release pain medication over time and can control pain for up to 72 hours.  Depending on how you respond to EXPAREL, you may require less pain medication during your recovery.  Possible side effects:  Temporary loss of sensation or ability to move in the area where bupivacaine was injected.  Nausea, vomiting, constipation  Rarely,   numbness and tingling in your mouth or lips, lightheadedness, or anxiety may occur.  Call your doctor right away if you think you may be experiencing any of these sensations, or if you have other questions regarding possible side effects.  Follow all other discharge instructions given to you by your surgeon or nurse. Eat a healthy diet and drink plenty of water or other  fluids.  If you return to the hospital for any reason within 96 hours following the administration of EXPAREL, it is important for health care providers to know that you have received this anesthetic. A teal colored band has been placed on your arm with the date, time and amount of EXPAREL you have received in order to alert and inform your health care providers. Please leave this armband in place for the full 96 hours following administration, and then you may remove the band. 

## 2019-02-04 NOTE — H&P (Signed)
[name] [name] is an 68 y.o. male.   Chief Complaint: Left shoulder pain HPI: [name] [name] is a 68 year old patient with left shoulder pain.  About 3 months ago he was trying to hold open the door and he sustained an injury to his shoulder at that time.  MRI scanning demonstrates about a 2 cm retracted rotator cuff tear.  The patient is active and likes to play golf.  Currently is having difficulty doing that.  He is failed a course of conservative treatment.  Presents now for operative treatment primarily to improve function and pain in the shoulder but also to avoid progressive tearing the rotator cuff which would potentially necessitate further more invasive reconstructive surgery in the future.  Past Medical History:  Diagnosis Date  . Adenomatous colon polyp 09/07/2004  . Amblyopia   . CAD (coronary artery disease)    cardiac CT with a 50% LAD lesion in 2007  . Diverticulosis   . DM2 (diabetes mellitus, type 2) ([place])   . Elevated LFTs    secondary to labetalol  . Esophageal stricture 2003  . External hemorrhoids 2012   lanced by Dr Rise Patience  . Family history of anesthesia complication    "sister had PONV"  . GERD (gastroesophageal reflux disease)    esophageal stricture, pmh  . Hiatal hernia 01/04/00  . HLD (hyperlipidemia)   . HTN (hypertension)   . Hypopotassemia     Past Surgical History:  Procedure Laterality Date  . BACK SURGERY    . CARDIAC CATHETERIZATION  2007; 03/26/2014  . COLONOSCOPY  2006   tics , ADENOMATOUS polyps. F/U due 2011  . COLONOSCOPY  2012   Tics  . CORONARY ANGIOPLASTY WITH STENT PLACEMENT  04/01/2014   "2"  . ESOPHAGEAL DILATION  2003  . ESOPHAGOGASTRODUODENOSCOPY  2003   ERD, stricture dilated  . EXCISIONAL HEMORRHOIDECTOMY  ~ 2008  . LEFT HEART CATHETERIZATION WITH CORONARY ANGIOGRAM N/A 03/26/2014   Procedure: LEFT HEART CATHETERIZATION WITH CORONARY ANGIOGRAM;  Surgeon: Peter M Martinique, MD;  Location: [facility] C[facility] LAB;  Service: Cardiovascular;   Laterality: N/A;  . PERCUTANEOUS CORONARY STENT INTERVENTION (PCI-S) N/A 04/01/2014   Procedure: PERCUTANEOUS CORONARY STENT INTERVENTION (PCI-S);  Surgeon: Peter M Martinique, MD;  Location: [facility] CATH LAB;  Service: Cardiovascular;  Laterality: N/A;  . POSTERIOR LAMINECTOMY / DECOMPRESSION LUMBAR SPINE  12/2009   L4 , Dr Sherwood Gambler    Family History  Problem Relation Age of Onset  . Heart attack Mother 73  . Stroke Mother 81  . Prostate cancer Father   . Diabetes Father   . Heart disease Father 70       CBAG   . Diabetes Brother   . Heart attack Brother   . Breast cancer Maternal Aunt   . Heart attack Maternal Grandfather 61  . Diabetes Paternal Grandmother   . Stroke Paternal Grandfather 34  . Heart disease Sister    Social History:  reports that he has never smoked. He has never used smokeless tobacco. He reports current alcohol use of about 2.0 standard drinks of alcohol per week. He reports that he does not use drugs.  Allergies:  Allergies  Allergen Reactions  . Aspirin Other (See Comments)    Angioedema  . Oxycodone     nausea  . Contrast Media [Iodinated Diagnostic Agents] Itching and Rash    Developed hives despite pre treatment  . Norvasc [Amlodipine Besylate] Other (See Comments)    09/12/2013 gingival hyperplasia , Dr Geralynn Ochs ,DDS  Medications Prior to Admission  Medication Sig Dispense Refill  . atorvastatin (LIPITOR) 80 MG tablet Take 1 tablet by mouth every day at 6 pm 90 tablet 2  . carvedilol (COREG) 3.125 MG tablet Take 1 tablet (3.125 mg) by mouth 2 (two) times daily  with a meal. 180 tablet 3  . clopidogrel (PLAVIX) 75 MG tablet Take 1 tablet by mouth daily 90 tablet 2  . Coenzyme Q10 (CO Q 10 PO) Take 200 mg by mouth daily.     . Cyanocobalamin (VITAMIN B 12 PO) Take 1 tablet by mouth daily.    . irbesartan (AVAPRO) 300 MG tablet Take 1 tablet (300 mg total) by mouth daily. 90 tablet 3  . metFORMIN (GLUCOPHAGE) 500 MG tablet Take 1 tablet by mouth  twice a day  with a meal 180 tablet 3  . Multiple Vitamins-Minerals (MULTIVITAMIN WITH MINERALS) tablet Take 1 tablet by mouth daily.    Marland Kitchen spironolactone (ALDACTONE) 25 MG tablet Take 1 tablet by mouth  daily 90 tablet 3  . glucose blood (ONE TOUCH ULTRA TEST) test strip Use one strip per test. Test blood sugars 1-4 times daily as instructed. 25 each 5  . nitroGLYCERIN (NITROSTAT) 0.4 MG SL tablet Place 1 tablet (0.4 mg total) under the tongue every 5 (five) minutes as needed for chest pain. 100 tablet 1  . sildenafil (REVATIO) 20 MG tablet Take 1-5 tablets by mouth daily as needed. 50 tablet 0    No results found for this or any previous visit (from the past 48 hour(s)). No results found.  Review of Systems  Musculoskeletal: Positive for joint pain.  All other systems reviewed and are negative.   Blood pressure (!) 174/85, pulse (!) 48, temperature (!) 97.4 F (36.3 C), resp. rate 14, height 5\' 9"  (1.753 m), weight 83.7 kg, SpO2 100 %. Physical Exam  Constitutional: He appears well-developed.  HENT:  Head: Normocephalic.  Eyes: Pupils are equal, round, and reactive to light.  Neck: Normal range of motion.  Cardiovascular: Normal rate.  Respiratory: Effort normal.  Neurological: He is alert.  Skin: Skin is warm.  Psychiatric: He has a normal mood and affect.  Examination of the left shoulder demonstrates some weakness to the infraspinatus and supraspinatus testing left versus right.  No discrete AC joint tenderness is present.  No restriction of passive range of motion noted on that left-hand side.  Motor sensory function hand is intact.  There is little bit of coarseness and grinding with internal/external rotation of that left shoulder.  Assessment/Plan Impression is left shoulder rotator cuff tear with possible degenerative superior labral tearing and biceps tendinitis.  Plan is arthroscopy with labral debridement biceps tendon release mini open rotator cuff tear repair and biceps tenodesis.   Risk benefits are discussed with the patient including but limited to infection nerve vessel damage shoulder stiffness incomplete restoration of function as well as potential need for more surgery in the future.  Patient understands the risks and benefits and wishes to proceed.  Plan to use CPM machine postoperatively.  All questions answered  Anderson Malta, MD 02/04/2019, 9:37 AM

## 2019-02-04 NOTE — Anesthesia Procedure Notes (Signed)
Anesthesia Regional Block: Interscalene brachial plexus block   Pre-Anesthetic Checklist: ,, timeout performed, Correct Patient, Correct Site, Correct Laterality, Correct Procedure, Correct Position, site marked, Risks and benefits discussed,  Surgical consent,  Pre-op evaluation,  At surgeon's request and post-op pain management  Laterality: Left  Prep: chloraprep       Needles:  Injection technique: Single-shot  Needle Type: Echogenic Stimulator Needle     Needle Length: 5cm  Needle Gauge: 22     Additional Needles:   Procedures:, nerve stimulator,,,,,,,   Nerve Stimulator or Paresthesia:  Response: biceps flexion, 0.45 mA,   Additional Responses:   Narrative:  Start time: 02/04/2019 9:48 AM End time: 02/04/2019 10:53 AM Injection made incrementally with aspirations every 5 mL.  Performed by: Personally  Anesthesiologist: Albertha Ghee, MD  Additional Notes: Functioning IV was confirmed and monitors were applied.  A 35mm 22ga Arrow echogenic stimulator needle was used. Sterile prep and drape,hand hygiene and sterile gloves were used.  Negative aspiration and negative test dose prior to incremental administration of local anesthetic. The patient tolerated the procedure well.  Ultrasound guidance: relevent anatomy identified, needle position confirmed, local anesthetic spread visualized around nerve(s), vascular puncture avoided.  Image printed for medical record.

## 2019-02-04 NOTE — Brief Op Note (Signed)
   02/04/2019  12:33 PM  PATIENT:  [name] [name]  68 y.o. male  PRE-OPERATIVE DIAGNOSIS:  left shoulder rotator cuff tear  POST-OPERATIVE DIAGNOSIS:  left shoulder rotator cuff tear  PROCEDURE:  Procedure(s): LEFT SHOULDER ARTHROSCOPY, BICEPS TENODESIS, MINI OPEN ROTATOR CUFF TEAR REPAIR limited debridement  SURGEON:  Surgeon(s): Marlou Sa, Tonna Corner, MD  ASSISTANT: Annie Main pa  ANESTHESIA:   general  EBL: 25 ml    Total I/O In: 1000 [I.V.:1000] Out: 20 [Blood:20]  BLOOD ADMINISTERED: none  DRAINS: none   LOCAL MEDICATIONS USED:  none  SPECIMEN:  No Specimen  COUNTS:  YES  TOURNIQUET:  * No tourniquets in log *  DICTATION: .Other Dictation: Dictation Number [id number]  PLAN OF CARE: Discharge to home after PACU  PATIENT DISPOSITION:  PACU - hemodynamically stable

## 2019-02-04 NOTE — Anesthesia Preprocedure Evaluation (Signed)
Anesthesia Evaluation  Patient identified by MRN, date of birth, ID band Patient awake    Reviewed: Allergy & Precautions, H&P , NPO status , Patient's Chart, lab work & pertinent test results  Airway Mallampati: II   Neck ROM: full    Dental   Pulmonary neg pulmonary ROS,    breath sounds clear to auscultation       Cardiovascular hypertension, + CAD and + Cardiac Stents   Rhythm:regular Rate:Normal     Neuro/Psych    GI/Hepatic hiatal hernia, GERD  ,  Endo/Other  diabetes, Type 2  Renal/GU      Musculoskeletal   Abdominal   Peds  Hematology   Anesthesia Other Findings   Reproductive/Obstetrics                             Anesthesia Physical Anesthesia Plan  ASA: III  Anesthesia Plan: General   Post-op Pain Management:  Regional for Post-op pain   Induction: Intravenous  PONV Risk Score and Plan: 2 and Ondansetron, Dexamethasone, Midazolam and Treatment may vary due to age or medical condition  Airway Management Planned: Oral ETT  Additional Equipment:   Intra-op Plan:   Post-operative Plan: Extubation in OR  Informed Consent: I have reviewed the patients History and Physical, chart, labs and discussed the procedure including the risks, benefits and alternatives for the proposed anesthesia with the patient or authorized representative who has indicated his/her understanding and acceptance.       Plan Discussed with: CRNA, Anesthesiologist and Surgeon  Anesthesia Plan Comments:         Anesthesia Quick Evaluation

## 2019-02-04 NOTE — Op Note (Signed)
NAME: [name] [name], [name] [name] [name] MEDICAL RECORD AO:[id number] ACCOUNT [id number] DATE OF BIRTH:[birth year] FACILITY: [facility] LOCATION: [facility] PHYSICIAN:Anapaula Severt Randel Pigg, MD  OPERATIVE REPORT  DATE OF PROCEDURE:  02/04/2019  PREOPERATIVE DIAGNOSIS:  Left rotator cuff tear and biceps tendinitis and superior labrum anterior and posterior tear.  POSTOPERATIVE DIAGNOSIS:  Left rotator cuff tear and biceps tendinitis and superior labrum anterior and posterior tear.  PROCEDURE:  Left shoulder arthroscopy, biceps tendon release, and limited debridement of superior labrum with mini-open rotator cuff tear repair and biceps tenodesis.  SURGEON:  Meredith Pel, MD  ASSISTANT:  Annie Main, PA-C   INDICATIONS:  [name] [name] is a 68 year old patient with left shoulder pain.  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  PROCEDURE IN DETAIL:  The patient was brought to the operating room where general anesthetic was induced.  Preoperative antibiotics were administered.  Timeout was called.  The left shoulder was prescrubbed with hydrogen peroxide and alcohol prep with  ChloraPrep solution and draped in a sterile manner.  The patient was placed in the beach-chair position with the head in neutral position.  [name] [name] [name] [name] [name] was used to seal both the axilla as well as the operative field.  A posterior portal was created 2 cm medial  and inferior to the posterolateral margin of the acromion.  Diagnostic arthroscopy was performed.  The patient did have some mild synovitis within the rotator interval which was released.  The patient also had a pretty significant SLAP tear with  significant fraying and degeneration, particularly anteriorly.  The patient also had a very large rotator cuff tear which was retracted to the top of the humeral head.  Biceps tendon was released, and the superior labrum was debrided.  Rotator interval  was opened to help with  mobilization of the cuff.  Debridement was also performed at that capsule cuff interface on the glenoid superior aspect.  The glenohumeral articular surfaces were intact.  At this time, instruments were removed.  Anterior portal  was created under direct visualization to facilitate this endeavor.  Instruments were removed and portals were closed using 3-0 nylon.  [name] [name] [name] [name] [name] was then used to cover the entire operative field.  The incision was made off the anterolateral margin of the  acromion.  The deltoid was split a measured distance of 4 cm and marked with a #1 Vicryl suture between the middle and anterior raphe.  Bursectomy was performed.  Biceps was then tenodesed into the bicipital groove using an Arthrex 5.5 SwiveLock.  This  gave good fixation under appropriate tension.  Attention was then directed towards the rotator cuff tear.  This was an unusual rotator cuff tear.  Four 0 Vicryl tagging sutures were placed in the medial aspect of the rotator cuff tear.  The attachment of  the rotator cuff itself was intact on the tuberosity.  This was essentially a grinding chronic wearing-away-type tear just medial to the tendon attachment on the slope of the tuberosity.  For this reason, a different approach was used to facilitate the  repair.  It was more of a side-to-side repair.  The tendon itself would not mobilize to the footprint attachment site.  The coracohumeral ligament was released.  Rotator interval slide was performed as well, and the tendon would not mobilize back to the  footprint; however, there was excellent robust rotator cuff attachment as seen on the MRI scan as well as visually in person of the rotator cuff which was  still intact at the rotator cuff footprint.  For this reason, 2 corkscrew suture anchors were  placed at the medial aspect of the footprint.  Four suture limbs were passed medially with the scorpion, 4 suture limbs passed laterally through the intact rotator cuff attachment site.  Two  of the 0 Vicryl tagging sutures on the medial aspect of the  rotator cuff tear were then passed through the tendon at its lateral-most aspect.  This gave a reinforced repair.  The FiberTape sutures were then tied and then they were split.  Four limbs placed anterior, 4 limbs placed posterior, each with the Vicryl  sutures which had been passed through the rotator cuff attachment site.  This gave a very secure side-to-side repair.  Anteriorly, the repair was reinforced with 0 Vicryl suture closer to the superior aspect of the bicipital groove.  After this was done,  the arm was taken through a range of motion.  The partial acromioplasty was also performed with a rasp.  The shoulder had good range of motion.  Thorough irrigation was performed.  The instruments were then removed, and the deltoid split was closed  using 0 Vicryl suture followed by interrupted inverted 2-0 Vicryl suture and 3-0 Monocryl.  Waterproof dressings were applied along with a shoulder immobilizer.  The patient tolerated the procedure well without immediate complication.  [name] [name]'s assistance  was required at all times during the case for retraction, opening and closing and limb positioning.  His assistance was a medical necessity.  LN/NUANCE  D:02/04/2019 T:02/04/2019 JOB:[id number]

## 2019-02-04 NOTE — Anesthesia Procedure Notes (Signed)
Procedure Name: Intubation Date/Time: 02/04/2019 10:15 AM Performed by: Albertha Ghee, MD Pre-anesthesia Checklist: Patient identified, Emergency Drugs available, Suction available and Patient being monitored Patient Re-evaluated:Patient Re-evaluated prior to induction Oxygen Delivery Method: Circle system utilized Preoxygenation: Pre-oxygenation with 100% oxygen Induction Type: IV induction Ventilation: Mask ventilation without difficulty Laryngoscope Size: Mac and 3 Grade View: Grade I Tube type: Oral Tube size: 7.0 mm Number of attempts: 1 Placement Confirmation: ETT inserted through vocal cords under direct vision,  positive ETCO2 and breath sounds checked- equal and bilateral Secured at: 23 cm Tube secured with: Tape Dental Injury: Teeth and Oropharynx as per pre-operative assessment

## 2019-02-05 ENCOUNTER — Encounter (HOSPITAL_BASED_OUTPATIENT_CLINIC_OR_DEPARTMENT_OTHER): Payer: Self-pay | Admitting: Orthopedic Surgery

## 2019-02-05 ENCOUNTER — Telehealth: Payer: Self-pay | Admitting: Orthopedic Surgery

## 2019-02-05 NOTE — Telephone Encounter (Signed)
Patient called wanting to know if he can resume his normal medication today after having surgery yesterday.  He is mainly concerned about the blood thinner.  CB#[phone].  Thank you.

## 2019-02-05 NOTE — Telephone Encounter (Signed)
Please advise. Thanks.  

## 2019-02-05 NOTE — Telephone Encounter (Signed)
IC s/w patient and advised  

## 2019-02-05 NOTE — Telephone Encounter (Signed)
Yes to resuming all meds pls callth x

## 2019-02-06 ENCOUNTER — Ambulatory Visit: Payer: Medicare Other | Admitting: Family Medicine

## 2019-02-06 ENCOUNTER — Encounter (HOSPITAL_BASED_OUTPATIENT_CLINIC_OR_DEPARTMENT_OTHER): Payer: Self-pay | Admitting: Orthopedic Surgery

## 2019-02-06 NOTE — Anesthesia Postprocedure Evaluation (Signed)
Anesthesia Post Note  Patient: [name] [name]  Procedure(s) Performed: LEFT SHOULDER ARTHROSCOPY, BICEPS TENODESIS, MINI OPEN ROTATOR CUFF TEAR REPAIR (Left Shoulder)     Patient location during evaluation: PACU Anesthesia Type: General Level of consciousness: awake and alert Pain management: pain level controlled Vital Signs Assessment: post-procedure vital signs reviewed and stable Respiratory status: spontaneous breathing, nonlabored ventilation, respiratory function stable and patient connected to nasal cannula oxygen Cardiovascular status: blood pressure returned to baseline and stable Postop Assessment: no apparent nausea or vomiting Anesthetic complications: no    Last Vitals:  Vitals:   02/04/19 1330 02/04/19 1456  BP: (!) 190/102 (!) 156/91  Pulse: (!) 56 (!) 55  Resp: 19 18  Temp:  36.6 C  SpO2: 98% 97%    Last Pain:  Vitals:   02/05/19 0924  PainSc: [place]

## 2019-02-08 ENCOUNTER — Other Ambulatory Visit: Payer: Self-pay

## 2019-02-08 ENCOUNTER — Telehealth: Payer: Self-pay | Admitting: Orthopedic Surgery

## 2019-02-08 ENCOUNTER — Telehealth: Payer: Self-pay | Admitting: Radiology

## 2019-02-08 ENCOUNTER — Telehealth: Payer: Self-pay | Admitting: Family Medicine

## 2019-02-08 MED ORDER — METFORMIN HCL 500 MG PO TABS: ORAL_TABLET | ORAL | 3 refills | Status: DC

## 2019-02-08 NOTE — Telephone Encounter (Signed)
fyi

## 2019-02-08 NOTE — Telephone Encounter (Signed)
He could also drink a bottle of mag citrate that would probably work also

## 2019-02-08 NOTE — Telephone Encounter (Signed)
IC LM advising per Dr Marlou Sa.

## 2019-02-08 NOTE — Telephone Encounter (Signed)
Pt has been wthout medication since 8.6.20 and needs renewal refill sent to pharmacy asap / please advise   metFORMIN (GLUCOPHAGE) 500 MG tablet   [facility]) - [place], [place] [phone] (Phone) [phone] (Fax)

## 2019-02-08 NOTE — Telephone Encounter (Signed)
Patient called triage line, states that he had surgery with Dr. Marlou Sa on Monday. States that due to hx of constipation on pain meds he started Monday with 2 colase. Took 6 colace on Tuesday, Wednesday he took 2 senakots, Thursday he took 3 Senakots, and 4 senakots today, and he still has not had much luck with going to bathroom.  I advised him to try a suppository enema and see how that does, he states that someone got him the Fleet enema, I advised him to try that and to let us know if it doesn't work. I advised that this should take less than an hour to work and if it hasn't then to call us back and we would contact Dr. Marlou Sa for further advise. ---Juluis Rainier

## 2019-02-08 NOTE — Telephone Encounter (Signed)
Spoke with patient read note from Dr Marlou Sa to patient concerning taking mag citrate

## 2019-02-08 NOTE — Telephone Encounter (Signed)
Refill sent to pharmacy.   

## 2019-02-10 DIAGNOSIS — M75122 Complete rotator cuff tear or rupture of left shoulder, not specified as traumatic: Secondary | ICD-10-CM

## 2019-02-10 DIAGNOSIS — S46112S Strain of muscle, fascia and tendon of long head of biceps, left arm, sequela: Secondary | ICD-10-CM

## 2019-02-11 ENCOUNTER — Telehealth: Payer: Self-pay

## 2019-02-11 NOTE — Telephone Encounter (Signed)
Patient called said that he is having some arm swelling but not pain but concerned because the swelling goes down into the biceps and forearm Also complains of a perfectly red circular spot under bandage but does not appear blistered. Wanting to make sure that this is ok.  [phone]

## 2019-02-11 NOTE — Telephone Encounter (Signed)
IC s/w patient and advised  

## 2019-02-11 NOTE — Telephone Encounter (Signed)
Sounds normal

## 2019-02-13 ENCOUNTER — Encounter: Payer: Self-pay | Admitting: Orthopedic Surgery

## 2019-02-13 ENCOUNTER — Telehealth: Payer: Self-pay | Admitting: Orthopedic Surgery

## 2019-02-13 ENCOUNTER — Ambulatory Visit (INDEPENDENT_AMBULATORY_CARE_PROVIDER_SITE_OTHER): Payer: Medicare Other | Admitting: Orthopedic Surgery

## 2019-02-13 DIAGNOSIS — S46012A Strain of muscle(s) and tendon(s) of the rotator cuff of left shoulder, initial encounter: Secondary | ICD-10-CM

## 2019-02-13 MED ORDER — METHOCARBAMOL 500 MG PO TABS: 500.0000 mg | ORAL_TABLET | Freq: Three times a day (TID) | ORAL | 0 refills | Status: DC | PRN

## 2019-02-13 MED ORDER — OXYCODONE HCL 5 MG PO TABS: ORAL_TABLET | ORAL | 0 refills | Status: DC

## 2019-02-13 NOTE — Telephone Encounter (Signed)
IC advised.  

## 2019-02-13 NOTE — Telephone Encounter (Signed)
Please advise. Thanks.  

## 2019-02-13 NOTE — Progress Notes (Signed)
Post-Op Visit Note   Patient: [name] [name]           Date of Birth: May 03, 1951           MRN: [id number] Visit Date: 02/13/2019 PCP: Vivi Barrack, MD   Assessment & Plan:  Chief Complaint:  Chief Complaint  Patient presents with  . Left Shoulder - Routine Post Op   Visit Diagnoses:  1. Traumatic complete tear of left rotator cuff, initial encounter     Plan: Jayten is a patient is now about a week out left shoulder biceps tenodesis rotator cuff repair.  He is in the CPM sheet about 3 hours a day.  On exam he is a little bit on the stiff side.  The passive motion feels good in terms of crepitus.  Refill oxycodone refill Robaxin start physical therapy next week continue CPM 2 more weeks.  Like him to do it at least 3 hours a day if not a little bit more.  DC the sling 1 week from this coming Friday.  Follow-up with me in 4 weeks.  Follow-Up Instructions: Return in about 4 weeks (around 03/13/2019).   Orders:  No orders of the defined types were placed in this encounter.  Meds ordered this encounter  Medications  . oxyCODONE (ROXICODONE) 5 MG immediate release tablet    Sig: 1 po q 8-12hrs prn pain    Dispense:  30 tablet    Refill:  0  . methocarbamol (ROBAXIN) 500 MG tablet    Sig: Take 1 tablet (500 mg total) by mouth every 8 (eight) hours as needed for muscle spasms.    Dispense:  30 tablet    Refill:  0    Imaging: No results found.  PMFS History: Patient Active Problem List   Diagnosis Date Noted  . Labral tear of long head of biceps tendon, left, sequela   . Complete tear of left rotator cuff   . Diverticulosis of colon (without mention of hemorrhage) 04/25/2011  . External thrombosed hemorrhoids 04/25/2011  . Adenomatous colon polyp 03/17/2011  . CAD, NATIVE VESSEL 11/25/2009  . LOW BACK PAIN, CHRONIC 12/13/2007  . Hyperlipidemia associated with type 2 diabetes mellitus ([place]) 06/08/2007  . T2DM (type 2 diabetes mellitus) ([place]) 02/27/2007  . Hypertension  associated with diabetes ([place]) 02/27/2007  . AMBLYOPIA 12/06/2006  . GERD 12/06/2006   Past Medical History:  Diagnosis Date  . Adenomatous colon polyp 09/07/2004  . Amblyopia   . CAD (coronary artery disease)    cardiac CT with a 50% LAD lesion in 2007  . Diverticulosis   . DM2 (diabetes mellitus, type 2) ([place])   . Elevated LFTs    secondary to labetalol  . Esophageal stricture 2003  . External hemorrhoids 2012   lanced by Dr Rise Patience  . Family history of anesthesia complication    "sister had PONV"  . GERD (gastroesophageal reflux disease)    esophageal stricture, pmh  . Hiatal hernia 01/04/00  . HLD (hyperlipidemia)   . HTN (hypertension)   . Hypopotassemia     Family History  Problem Relation Age of Onset  . Heart attack Mother 34  . Stroke Mother 23  . Prostate cancer Father   . Diabetes Father   . Heart disease Father 30       CBAG   . Diabetes Brother   . Heart attack Brother   . Breast cancer Maternal Aunt   . Heart attack Maternal Grandfather 61  . Diabetes  Paternal Grandmother   . Stroke Paternal Grandfather 38  . Heart disease Sister     Past Surgical History:  Procedure Laterality Date  . BACK SURGERY    . CARDIAC CATHETERIZATION  2007; 03/26/2014  . COLONOSCOPY  2006   tics , ADENOMATOUS polyps. F/U due 2011  . COLONOSCOPY  2012   Tics  . CORONARY ANGIOPLASTY WITH STENT PLACEMENT  04/01/2014   "2"  . ESOPHAGEAL DILATION  2003  . ESOPHAGOGASTRODUODENOSCOPY  2003   ERD, stricture dilated  . EXCISIONAL HEMORRHOIDECTOMY  ~ 2008  . LEFT HEART CATHETERIZATION WITH CORONARY ANGIOGRAM N/A 03/26/2014   Procedure: LEFT HEART CATHETERIZATION WITH CORONARY ANGIOGRAM;  Surgeon: Peter M Martinique, MD;  Location: [facility] CATH LAB;  Service: Cardiovascular;  Laterality: N/A;  . PERCUTANEOUS CORONARY STENT INTERVENTION (PCI-S) N/A 04/01/2014   Procedure: PERCUTANEOUS CORONARY STENT INTERVENTION (PCI-S);  Surgeon: Peter M Martinique, MD;  [facility] C[facility];  Service:  Cardiovascular;  Laterality: N/A;  . POSTERIOR LAMINECTOMY / DECOMPRESSION LUMBAR SPINE  12/2009   L4 , Dr Sherwood Gambler  . SHOULDER ARTHROSCOPY WITH ROTATOR CUFF REPAIR AND OPEN BICEPS TENODESIS Left 02/04/2019   Procedure: LEFT SHOULDER ARTHROSCOPY, BICEPS TENODESIS, MINI OPEN ROTATOR CUFF TEAR REPAIR;  Surgeon: Meredith Pel, MD;  Location: [facility];  Service: Orthopedics;  Laterality: Left;   Social History   Occupational History  . Occupation: Retired   Tobacco Use  . Smoking status: Never Smoker  . Smokeless tobacco: Never Used  Substance and Sexual Activity  . Alcohol use: Yes    Alcohol/week: 2.0 standard drinks    Types: 2 Cans of beer per week    Comment: social  . Drug use: No  . Sexual activity: Not Currently

## 2019-02-13 NOTE — Telephone Encounter (Signed)
Yes as much as possible

## 2019-02-13 NOTE — Telephone Encounter (Signed)
Patient called stating that he has a machine that moves his arm up so many degrees.  He is currently at 90 degrees and wants to know if you want him to continue to 120 degrees.  CB#[phone].  Thank you.

## 2019-02-15 ENCOUNTER — Telehealth: Payer: Self-pay | Admitting: Orthopedic Surgery

## 2019-02-15 NOTE — Telephone Encounter (Signed)
Patient called and wanted to know if he can drive.  Please call patient to advise. [phone]  Surgery date:02/04/19

## 2019-02-15 NOTE — Telephone Encounter (Signed)
Pease advise.

## 2019-02-15 NOTE — Telephone Encounter (Signed)
His decision about driving.  Most people with his surgery drive around the 3 to 4-week time.  After surgery.  Needs to be out of sling off pain medicine and have at least 90 degrees of forward flexion before driving.  Thus my recommendation but it ultimately is his decision

## 2019-02-18 NOTE — Telephone Encounter (Signed)
IC s/w patient and advised. Verbalized understanding.  

## 2019-02-19 ENCOUNTER — Ambulatory Visit: Payer: Medicare Other

## 2019-02-20 DIAGNOSIS — M25512 Pain in left shoulder: Secondary | ICD-10-CM | POA: Diagnosis not present

## 2019-02-21 ENCOUNTER — Other Ambulatory Visit: Payer: Self-pay

## 2019-02-21 ENCOUNTER — Encounter: Payer: Self-pay | Admitting: Family Medicine

## 2019-02-21 ENCOUNTER — Ambulatory Visit (INDEPENDENT_AMBULATORY_CARE_PROVIDER_SITE_OTHER): Payer: Medicare Other | Admitting: Family Medicine

## 2019-02-21 VITALS — BP 131/77 | HR 53 | Temp 97.6°F | Ht 69.0 in | Wt 182.2 lb

## 2019-02-21 DIAGNOSIS — E785 Hyperlipidemia, unspecified: Secondary | ICD-10-CM | POA: Diagnosis not present

## 2019-02-21 DIAGNOSIS — E1159 Type 2 diabetes mellitus with other circulatory complications: Secondary | ICD-10-CM

## 2019-02-21 DIAGNOSIS — E1169 Type 2 diabetes mellitus with other specified complication: Secondary | ICD-10-CM | POA: Diagnosis not present

## 2019-02-21 DIAGNOSIS — Z13220 Encounter for screening for lipoid disorders: Secondary | ICD-10-CM

## 2019-02-21 DIAGNOSIS — I152 Hypertension secondary to endocrine disorders: Secondary | ICD-10-CM

## 2019-02-21 DIAGNOSIS — I10 Essential (primary) hypertension: Secondary | ICD-10-CM | POA: Diagnosis not present

## 2019-02-21 DIAGNOSIS — E1165 Type 2 diabetes mellitus with hyperglycemia: Secondary | ICD-10-CM | POA: Diagnosis not present

## 2019-02-21 DIAGNOSIS — Z125 Encounter for screening for malignant neoplasm of prostate: Secondary | ICD-10-CM

## 2019-02-21 DIAGNOSIS — Z23 Encounter for immunization: Secondary | ICD-10-CM | POA: Diagnosis not present

## 2019-02-21 LAB — LIPID PANEL
Cholesterol: 119 mg/dL (ref 0–200)
HDL: 35.9 mg/dL — ABNORMAL LOW (ref >39.00)
LDL Cholesterol: 59 mg/dL (ref 0–99)
NonHDL: 83.39
Total CHOL/HDL Ratio: 3
Triglycerides: 121 mg/dL (ref 0.0–149.0)
VLDL: 24.2 mg/dL (ref 0.0–40.0)

## 2019-02-21 LAB — COMPREHENSIVE METABOLIC PANEL
ALT: 27 U/L (ref 0–53)
AST: 14 U/L (ref 0–37)
Albumin: 4 g/dL (ref 3.5–5.2)
Alkaline Phosphatase: 108 U/L (ref 39–117)
BUN: 19 mg/dL (ref 6–23)
CO2: 29 mEq/L (ref 19–32)
Calcium: 10 mg/dL (ref 8.4–10.5)
Chloride: 103 mEq/L (ref 96–112)
Creatinine, Ser: 0.94 mg/dL (ref 0.40–1.50)
GFR: 79.55 mL/min (ref >60.00)
Glucose, Bld: 108 mg/dL — ABNORMAL HIGH (ref 70–99)
Potassium: 4.4 mEq/L (ref 3.5–5.1)
Sodium: 140 mEq/L (ref 135–145)
Total Bilirubin: 0.6 mg/dL (ref 0.2–1.2)
Total Protein: 6.5 g/dL (ref 6.0–8.3)

## 2019-02-21 LAB — PSA, MEDICARE: PSA: 1.28 ng/ml (ref 0.10–4.00)

## 2019-02-21 LAB — CBC
HCT: 39.9 % (ref 39.0–52.0)
Hemoglobin: 13.3 g/dL (ref 13.0–17.0)
MCHC: 33.3 g/dL (ref 30.0–36.0)
MCV: 99.5 fl (ref 78.0–100.0)
Platelets: 474 10*3/uL — ABNORMAL HIGH (ref 150.0–400.0)
RBC: 4.01 Mil/uL — ABNORMAL LOW (ref 4.22–5.81)
RDW: 13.6 % (ref 11.5–15.5)
WBC: 7.6 10*3/uL (ref 4.0–10.5)

## 2019-02-21 LAB — HEMOGLOBIN A1C: Hgb A1c MFr Bld: 7.2 % — ABNORMAL HIGH (ref 4.6–6.5)

## 2019-02-21 LAB — TSH: TSH: 1.25 u[IU]/mL (ref 0.35–4.50)

## 2019-02-21 NOTE — Progress Notes (Signed)
   Chief Complaint:  [name] [name] is a 68 y.o. male who presents today with a chief complaint of T2DM follow up.   Assessment/Plan:  Hypertension associated with diabetes ([place]) At goal.  Continue Coreg 3.125 mg twice daily, irbesartan 300 mg daily, and spironolactone 25 mg daily.  Check CBC, C met, and TSH.  Hyperlipidemia associated with type 2 diabetes mellitus ([facility]) Check lipid panel.  Continue Lipitor 80 mg daily.  T2DM (type 2 diabetes mellitus) ([facility]) Check A1c.  Continue metformin 500 mg twice daily.  Preventative Healthcare Flu vaccine given today. Check PSA.   Follow up in 6 months.     Subjective:  HPI:  Recently had rotator cuff surgery and is doing well with that.  He is working with physical therapy and doing home exercises.  Seems to be recovering nicely.  His stable, chronic medical conditions are outlined below:  # T2DM - On metformin '500mg'$  twice daily. Tolerating well.  - ROS: No reported polyuria or polydipsia  # Essential Hypertension - On coreg 3.'125mg'$  twice daily, irbesartan '300mg'$  daily, and spironolactone '25mg'$  daily.  Tolerating well. - ROS: No reported chest pain or shortness of breath  # Dyslipidemia / Coronary Artery Disease - On lipitor '80mg'$  daily, plavix 75 mg daily.  Tolerating well. - ROS: No reported myalgias.    ROS: Per HPI  PMH: He reports that he has never smoked. He has never used smokeless tobacco. He reports current alcohol use of about 2.0 standard drinks of alcohol per week. He reports that he does not use drugs.      Objective:  Physical Exam: BP 131/77   Pulse (!) 53   Temp 97.6 F (36.4 C)   Ht '5\' 9"'$  (1.753 m)   Wt 182 lb 3.2 oz (82.6 kg)   SpO2 98%   BMI 26.91 kg/m   Wt Readings from Last 3 Encounters:  02/21/19 182 lb 3.2 oz (82.6 kg)  02/04/19 184 lb 8.4 oz (83.7 kg)  09/10/18 194 lb (88 kg)  Gen: NAD, resting comfortably CV: Regular rate and rhythm with no murmurs appreciated Pulm: Normal work of breathing,  clear to auscultation bilaterally with no crackles, wheezes, or rhonchi  MSK: No edema, cyanosis, or clubbing noted Skin: Warm, dry Neuro: Grossly normal, moves all extremities Psych: Normal affect and thought content      Dream Nodal M. [name], MD 02/21/2019 11:22 AM

## 2019-02-21 NOTE — Assessment & Plan Note (Signed)
At goal.  Continue Coreg 3.125 mg twice daily, irbesartan 300 mg daily, and spironolactone 25 mg daily.  Check CBC, C met, and TSH.

## 2019-02-21 NOTE — Patient Instructions (Signed)
It was very nice to see you today!  We will check blood work today.  Please try taking miralax to help with bowel movements.   Come back in 6 months or sooner if needed.   Take care, Dr [name]  Please try these tips to maintain a healthy lifestyle:   Eat at least 3 REAL meals and 1-2 snacks per day.  Aim for no more than 5 hours between eating.  If you eat breakfast, please do so within one hour of getting up.    Obtain twice as many fruits/vegetables as protein or carbohydrate foods for both lunch and dinner. (Half of each meal should be fruits/vegetables, one quarter protein, and one quarter starchy carbs)   Cut down on sweet beverages. This includes juice, soda, and sweet tea.    Exercise at least 150 minutes every week.

## 2019-02-21 NOTE — Assessment & Plan Note (Signed)
Check A1c.  Continue metformin 500 mg twice daily. 

## 2019-02-21 NOTE — Progress Notes (Signed)
Please inform patient of the following:  His A1c is up a small amount but the rest of his blood work is STABLE. Would like for him to keep up the good work and continue to work on diet and exercise.  Would like for him to come back in 6 months to recheck A1c.  [name] [name]. [name], MD 02/21/2019 4:18 PM

## 2019-02-21 NOTE — Assessment & Plan Note (Signed)
Check lipid panel.  Continue Lipitor 80 mg daily. 

## 2019-02-25 DIAGNOSIS — M25512 Pain in left shoulder: Secondary | ICD-10-CM | POA: Diagnosis not present

## 2019-02-27 DIAGNOSIS — M25512 Pain in left shoulder: Secondary | ICD-10-CM | POA: Diagnosis not present

## 2019-03-05 ENCOUNTER — Ambulatory Visit (INDEPENDENT_AMBULATORY_CARE_PROVIDER_SITE_OTHER): Payer: Medicare Other

## 2019-03-05 ENCOUNTER — Other Ambulatory Visit: Payer: Self-pay

## 2019-03-05 VITALS — BP 120/78 | Temp 98.2°F | Ht 69.0 in | Wt 181.0 lb

## 2019-03-05 DIAGNOSIS — Z0000 Encounter for general adult medical examination without abnormal findings: Secondary | ICD-10-CM

## 2019-03-05 DIAGNOSIS — M25512 Pain in left shoulder: Secondary | ICD-10-CM | POA: Diagnosis not present

## 2019-03-05 NOTE — Patient Instructions (Signed)
[name] , Thank you for taking time to come for your Medicare Wellness Visit. I appreciate your ongoing commitment to your health goals. Please review the following plan we discussed and let me know if I can assist you in the future.   Screening recommendations/referrals: Colorectal Screening: up to date; last 04/25/11; next reccommended 2022  Vision and Dental Exams: Recommended annual ophthalmology exams for early detection of glaucoma and other disorders of the eye Recommended annual dental exams for proper oral hygiene  Diabetic Exams: Diabetic Eye Exam: recommended yearly  Diabetic Foot Exam: up to date   Vaccinations: Influenza vaccine: completed  Pneumococcal vaccine: completed Tdap vaccine:  Up to date; last 12/18/08  Shingles vaccine: Please call your insurance company to determine your out of pocket expense for the Shingrix vaccine. You may receive this vaccine at your local pharmacy.  Advanced directives: Please bring a copy of your POA (Power of Attorney) and/or Living Will to your next appointment.  Goals: Recommend to drink at least 6-8 8oz glasses of water per day.  Next appointment: Please schedule your Annual Wellness Visit with your Nurse Health Advisor in one year.  Preventive Care 65 Years and Older, Male Preventive care refers to lifestyle choices and visits with your health care provider that can promote health and wellness. What does preventive care include?  A yearly physical exam. This is also called an annual well check.  Dental exams once or twice a year.  Routine eye exams. Ask your health care provider how often you should have your eyes checked.  Personal lifestyle choices, including:  Daily care of your teeth and gums.  Regular physical activity.  Eating a healthy diet.  Avoiding tobacco and drug use.  Limiting alcohol use.  Practicing safe sex.  Taking low doses of aspirin every day if recommended by your health care provider..   Taking vitamin and mineral supplements as recommended by your health care provider. What happens during an annual well check? The services and screenings done by your health care provider during your annual well check will depend on your age, overall health, lifestyle risk factors, and family history of disease. Counseling  Your health care provider may ask you questions about your:  Alcohol use.  Tobacco use.  Drug use.  Emotional well-being.  Home and relationship well-being.  Sexual activity.  Eating habits.  History of falls.  Memory and ability to understand (cognition).  Work and work Statistician. Screening  You may have the following tests or measurements:  Height, weight, and BMI.  Blood pressure.  Lipid and cholesterol levels. These may be checked every 5 years, or more frequently if you are over 57 years old.  Skin check.  Lung cancer screening. You may have this screening every year starting at age 82 if you have a 30-pack-year history of smoking and currently smoke or have quit within the past 15 years.  Fecal occult blood test (FOBT) of the stool. You may have this test every year starting at age 76.  Flexible sigmoidoscopy or colonoscopy. You may have a sigmoidoscopy every 5 years or a colonoscopy every 10 years starting at age 46.  Prostate cancer screening. Recommendations will vary depending on your family history and other risks.  Hepatitis C blood test.  Hepatitis B blood test.  Sexually transmitted disease (STD) testing.  Diabetes screening. This is done by checking your blood sugar (glucose) after you have not eaten for a while (fasting). You may have this done every 1-3 years.  Abdominal aortic aneurysm (AAA) screening. You may need this if you are a current or former smoker.  Osteoporosis. You may be screened starting at age 91 if you are at high risk. Talk with your health care provider about your test results, treatment options, and if  necessary, the need for more tests. Vaccines  Your health care provider may recommend certain vaccines, such as:  Influenza vaccine. This is recommended every year.  Tetanus, diphtheria, and acellular pertussis (Tdap, Td) vaccine. You may need a Td booster every 10 years.  Zoster vaccine. You may need this after age 38.  Pneumococcal 13-valent conjugate (PCV13) vaccine. One dose is recommended after age 54.  Pneumococcal polysaccharide (PPSV23) vaccine. One dose is recommended after age 32. Talk to your health care provider about which screenings and vaccines you need and how often you need them. This information is not intended to replace advice given to you by your health care provider. Make sure you discuss any questions you have with your health care provider. Document Released: 07/10/2015 Document Revised: 03/02/2016 Document Reviewed: 04/14/2015 Elsevier Interactive Patient Education  2017 [place] Prevention in the Home Falls can cause injuries. They can happen to people of all ages. There are many things you can do to make your home safe and to help prevent falls. What can I do on the outside of my home?  Regularly fix the edges of walkways and driveways and fix any cracks.  Remove anything that might make you trip as you walk through a door, such as a raised step or threshold.  Trim any bushes or trees on the path to your home.  Use bright outdoor lighting.  Clear any walking paths of anything that might make someone trip, such as rocks or tools.  Regularly check to see if handrails are loose or broken. Make sure that both sides of any steps have handrails.  Any raised decks and porches should have guardrails on the edges.  Have any leaves, snow, or ice cleared regularly.  Use sand or salt on walking paths during winter.  Clean up any spills in your garage right away. This includes oil or grease spills. What can I do in the bathroom?  Use night lights.   Install grab bars by the toilet and in the tub and shower. Do not use towel bars as grab bars.  Use non-skid mats or decals in the tub or shower.  If you need to sit down in the shower, use a plastic, non-slip stool.  Keep the floor dry. Clean up any water that spills on the floor as soon as it happens.  Remove soap buildup in the tub or shower regularly.  Attach bath mats securely with double-sided non-slip rug tape.  Do not have throw rugs and other things on the floor that can make you trip. What can I do in the bedroom?  Use night lights.  Make sure that you have a light by your bed that is easy to reach.  Do not use any sheets or blankets that are too big for your bed. They should not hang down onto the floor.  Have a firm chair that has side arms. You can use this for support while you get dressed.  Do not have throw rugs and other things on the floor that can make you trip. What can I do in the kitchen?  Clean up any spills right away.  Avoid walking on wet floors.  Keep items that you use  a lot in easy-to-reach places.  If you need to reach something above you, use a strong step stool that has a grab bar.  Keep electrical cords out of the way.  Do not use floor polish or wax that makes floors slippery. If you must use wax, use non-skid floor wax.  Do not have throw rugs and other things on the floor that can make you trip. What can I do with my stairs?  Do not leave any items on the stairs.  Make sure that there are handrails on both sides of the stairs and use them. Fix handrails that are broken or loose. Make sure that handrails are as long as the stairways.  Check any carpeting to make sure that it is firmly attached to the stairs. Fix any carpet that is loose or worn.  Avoid having throw rugs at the top or bottom of the stairs. If you do have throw rugs, attach them to the floor with carpet tape.  Make sure that you have a light switch at the top of the  stairs and the bottom of the stairs. If you do not have them, ask someone to add them for you. What else can I do to help prevent falls?  Wear shoes that:  Do not have high heels.  Have rubber bottoms.  Are comfortable and fit you well.  Are closed at the toe. Do not wear sandals.  If you use a stepladder:  Make sure that it is fully opened. Do not climb a closed stepladder.  Make sure that both sides of the stepladder are locked into place.  Ask someone to hold it for you, if possible.  Clearly mark and make sure that you can see:  Any grab bars or handrails.  First and last steps.  Where the edge of each step is.  Use tools that help you move around (mobility aids) if they are needed. These include:  Canes.  Walkers.  Scooters.  Crutches.  Turn on the lights when you go into a dark area. Replace any light bulbs as soon as they burn out.  Set up your furniture so you have a clear path. Avoid moving your furniture around.  If any of your floors are uneven, fix them.  If there are any pets around you, be aware of where they are.  Review your medicines with your doctor. Some medicines can make you feel dizzy. This can increase your chance of falling. Ask your doctor what other things that you can do to help prevent falls. This information is not intended to replace advice given to you by your health care provider. Make sure you discuss any questions you have with your health care provider. Document Released: 04/09/2009 Document Revised: 11/19/2015 Document Reviewed: 07/18/2014 Elsevier Interactive Patient Education  2017 [employer].

## 2019-03-05 NOTE — Progress Notes (Signed)
Subjective:   [name] [name] is a 68 y.o. male who presents for Medicare Annual/Subsequent preventive examination.  Review of Systems:   Cardiac Risk Factors include: advanced age (>38men, >7 women);hypertension;diabetes mellitus     Objective:    Vitals: BP 120/78 (BP Location: Right Arm, Patient Position: Sitting, Cuff Size: Normal)   Temp 98.2 F (36.8 C)   Ht 5\' 9"  (1.753 m)   Wt 181 lb (82.1 kg)   BMI 26.73 kg/m   Body mass index is 26.73 kg/m.  Advanced Directives 03/05/2019 02/04/2019 01/25/2019 01/31/2018 04/01/2014 04/01/2014 03/26/2014  Does Patient Have a Medical Advance Directive? Yes Yes Yes Yes - Yes Yes  Type of Advance Directive Living will - Living will;Healthcare Power of [place];Living will - [place];Living will [place];Living will  Does patient want to make changes to medical advance directive? No - Patient declined No - Patient declined No - Patient declined No - Patient declined - No - Patient declined -  Copy of [employer] in Chart? - - - No - copy requested No - copy requested - No - copy requested  Would patient like information on creating a medical advance directive? - No - Patient declined - - - - -    Tobacco Social History   Tobacco Use  Smoking Status Never Smoker  Smokeless Tobacco Never Used     Counseling given: Not Answered   Clinical Intake:  Pre-visit preparation completed: Yes  Pain : No/denies pain  Diabetes: Yes CBG done?: No Did pt. bring in CBG monitor from home?: No  How often do you need to have someone help you when you read instructions, pamphlets, or other written materials from your doctor or pharmacy?: 1 - Never  Interpreter Needed?: No  Information entered by :: Denman George LPN  Past Medical History:  Diagnosis Date  . Adenomatous colon polyp 09/07/2004  . Amblyopia   . CAD (coronary artery disease)    cardiac CT with a  50% LAD lesion in 2007  . Diverticulosis   . DM2 (diabetes mellitus, type 2) ([place])   . Elevated LFTs    secondary to labetalol  . Esophageal stricture 2003  . External hemorrhoids 2012   lanced by Dr Rise Patience  . Family history of anesthesia complication    "sister had PONV"  . GERD (gastroesophageal reflux disease)    esophageal stricture, pmh  . Hiatal hernia 01/04/00  . HLD (hyperlipidemia)   . HTN (hypertension)   . Hypopotassemia    Past Surgical History:  Procedure Laterality Date  . BACK SURGERY    . CARDIAC CATHETERIZATION  2007; 03/26/2014  . COLONOSCOPY  2006   tics , ADENOMATOUS polyps. F/U due 2011  . COLONOSCOPY  2012   Tics  . CORONARY ANGIOPLASTY WITH STENT PLACEMENT  04/01/2014   "2"  . ESOPHAGEAL DILATION  2003  . ESOPHAGOGASTRODUODENOSCOPY  2003   ERD, stricture dilated  . EXCISIONAL HEMORRHOIDECTOMY  ~ 2008  . LEFT HEART CATHETERIZATION WITH CORONARY ANGIOGRAM N/A 03/26/2014   Procedure: LEFT HEART CATHETERIZATION WITH CORONARY ANGIOGRAM;  Surgeon: Peter M Martinique, MD;  Location: [facility] CATH LAB;  Service: Cardiovascular;  Laterality: N/A;  . PERCUTANEOUS CORONARY STENT INTERVENTION (PCI-S) N/A 04/01/2014   Procedure: PERCUTANEOUS CORONARY STENT INTERVENTION (PCI-S);  Surgeon: Peter M Martinique, MD;  Location: [facility] CATH LAB;  Service: Cardiovascular;  Laterality: N/A;  . POSTERIOR LAMINECTOMY / DECOMPRESSION LUMBAR SPINE  12/2009  L4 , Dr Sherwood Gambler  . SHOULDER ARTHROSCOPY WITH ROTATOR CUFF REPAIR AND OPEN BICEPS TENODESIS Left 02/04/2019   Procedure: LEFT SHOULDER ARTHROSCOPY, BICEPS TENODESIS, MINI OPEN ROTATOR CUFF TEAR REPAIR;  Surgeon: Meredith Pel, MD;  Location: [facility];  Service: Orthopedics;  Laterality: Left;   Family History  Problem Relation Age of Onset  . Heart attack Mother 9  . Stroke Mother 78  . Prostate cancer Father   . Diabetes Father   . Heart disease Father 23       CBAG   . Diabetes Brother   . Heart attack Brother    . Breast cancer Maternal Aunt   . Heart attack Maternal Grandfather 61  . Diabetes Paternal Grandmother   . Stroke Paternal Grandfather 38  . Heart disease Sister    Social History   Socioeconomic History  . Marital status: Single    Spouse name: Not on file  . Number of children: 0  . Years of education: 61  . Highest education level: Not on file  Occupational History  . Occupation: Retired   Scientific laboratory technician  . Financial resource strain: Not on file  . Food insecurity    Worry: Not on file    Inability: Not on file  . Transportation needs    Medical: Not on file    Non-medical: Not on file  Tobacco Use  . Smoking status: Never Smoker  . Smokeless tobacco: Never Used  Substance and Sexual Activity  . Alcohol use: Yes    Alcohol/week: 2.0 standard drinks    Types: 2 Cans of beer per week    Comment: social  . Drug use: No  . Sexual activity: Not Currently  Lifestyle  . Physical activity    Days per week: Not on file    Minutes per session: Not on file  . Stress: Not on file  Relationships  . Social Herbalist on phone: Not on file    Gets together: Not on file    Attends religious service: Not on file    Active member of club or organization: Not on file    Attends meetings of clubs or organizations: Not on file    Relationship status: Not on file  Other Topics Concern  . Not on file  Social History Narrative   Fun: Designer, jewellery.     Outpatient Encounter Medications as of 03/05/2019  Medication Sig  . atorvastatin (LIPITOR) 80 MG tablet Take 1 tablet by mouth every day at 6 pm  . carvedilol (COREG) 3.125 MG tablet Take 1 tablet (3.125 mg) by mouth 2 (two) times daily  with a meal.  . clopidogrel (PLAVIX) 75 MG tablet Take 1 tablet by mouth daily  . Coenzyme Q10 (CO Q 10 PO) Take 200 mg by mouth daily.   . Cyanocobalamin (VITAMIN B 12 PO) Take 1 tablet by mouth daily.  Marland Kitchen glucose blood (ONE TOUCH ULTRA TEST) test strip Use one strip per test. Test  blood sugars 1-4 times daily as instructed.  . irbesartan (AVAPRO) 300 MG tablet Take 1 tablet (300 mg total) by mouth daily.  . metFORMIN (GLUCOPHAGE) 500 MG tablet Take 1 tablet by mouth  twice a day with a meal  . methocarbamol (ROBAXIN) 500 MG tablet Take 1 tablet (500 mg total) by mouth every 8 (eight) hours as needed for muscle spasms.  . Misc Natural Products (GLUCOSAMINE CHOND COMPLEX/MSM) TABS   . Multiple Vitamins-Minerals (MULTIVITAMIN WITH MINERALS)  tablet Take 1 tablet by mouth daily.  . nitroGLYCERIN (NITROSTAT) 0.4 MG SL tablet Place 1 tablet (0.4 mg total) under the tongue every 5 (five) minutes as needed for chest pain.  Marland Kitchen oxyCODONE (ROXICODONE) 5 MG immediate release tablet 1 po q 8-12hrs prn pain  . sildenafil (REVATIO) 20 MG tablet Take 1-5 tablets by mouth daily as needed.  Marland Kitchen spironolactone (ALDACTONE) 25 MG tablet Take 1 tablet by mouth  daily   No facility-administered encounter medications on file as of 03/05/2019.     Activities of Daily Living In your present state of health, do you have any difficulty performing the following activities: 03/05/2019 02/04/2019  Hearing? N N  Vision? N N  Difficulty concentrating or making decisions? N N  Walking or climbing stairs? N N  Dressing or bathing? N N  Doing errands, shopping? N -  Preparing Food and eating ? N -  Using the Toilet? N -  In the past six months, have you accidently leaked urine? N -  Do you have problems with loss of bowel control? N -  Managing your Medications? N -  Managing your Finances? N -  Some recent data might be hidden    Patient Care Team: Vivi Barrack, MD as PCP - General (Family Medicine) Martinique, Peter M, MD as PCP - Cardiology (Cardiology) Shirley Muscat Loreen Freud, MD as Consulting Physician (Optometry) Marlou Sa, Tonna Corner, MD as Consulting Physician (Orthopedic Surgery) [facility] as Consulting Physician   Assessment:   This is a routine wellness examination for  [name] [name].  Exercise Activities and Dietary recommendations Current Exercise Habits: The patient does not participate in regular exercise at present  Goals    . DIET - INCREASE WATER INTAKE     Drinks 2 bottles of water a day but wants to increase to more    . Weight (lb) < 200 lb (90.7 kg)     Would like to lose 10lbs in the next year       Fall Risk Fall Risk  03/05/2019 02/21/2019 01/31/2018 01/17/2017 08/10/2015  Falls in the past year? 0 0 No No No  Number falls in past yr: 0 - - - -  Injury with Fall? 0 - - - -  Follow up Education provided;Falls evaluation completed - - - -   Is the patient's home free of loose throw rugs in walkways, pet beds, electrical cords, etc?   yes      Grab bars in the bathroom? yes      Handrails on the stairs?   yes      Adequate lighting?   yes  Timed Get Up and Go Performed: completed and within normal timeframe   Depression Screen PHQ 2/9 Scores 03/05/2019 02/21/2019 08/08/2018 01/31/2018  PHQ - 2 Score 0 0 0 0    Cognitive Function- no cognitive concerns at this time      6CIT Screen 01/31/2018  What Year? 0 points  What month? 0 points  What time? 0 points  Count back from 20 0 points  Months in reverse 0 points    Immunization History  Administered Date(s) Administered  . Fluad Quad(high Dose 65+) 02/21/2019  . Influenza,inj,Quad PF,6+ Mos 03/19/2014  . Pneumococcal Conjugate-13 01/17/2017  . Pneumococcal Polysaccharide-23 01/31/2018  . Td 12/18/2008  . Zoster 12/24/2012    Qualifies for Shingles Vaccine? Discussed and patient will check with pharmacy for coverage.  Patient education handout provided    Screening Tests Health Maintenance  Topic Date Due  . OPHTHALMOLOGY EXAM  10/10/2018  . TETANUS/TDAP  02/21/2020 (Originally 12/19/2018)  . COLONOSCOPY  03/27/2021 (Originally 04/24/2016)  . HEMOGLOBIN A1C  08/24/2019  . FOOT EXAM  02/21/2020  . INFLUENZA VACCINE  Completed  . Hepatitis C Screening  Completed  . PNA vac Low Risk  Adult  Completed   Cancer Screenings: Lung: Low Dose CT Chest recommended if Age 75-80 years, 30 pack-year currently smoking OR have quit w/in 15years. Patient does not qualify. Colorectal: completed 04/25/11   Plan:    I have personally reviewed and addressed the Medicare Annual Wellness questionnaire and have noted the following in the patient's chart:  A. Medical and social history B. Use of alcohol, tobacco or illicit drugs  C. Current medications and supplements D. Functional ability and status E.  Nutritional status F.  Physical activity G. Advance directives H. List of other physicians I.  Hospitalizations, surgeries, and ER visits in previous 12 months J.  [place] such as hearing and vision if needed, cognitive and depression L. Referrals, records requested, and appointments- none (patient will schedule diabetic eye exam)   In addition, I have reviewed and discussed with patient certain preventive protocols, quality metrics, and best practice recommendations. A written personalized care plan for preventive services as well as general preventive health recommendations were provided to patient.   Signed,  Denman George, LPN  Nurse Health Advisor   Nurse Notes: no additions

## 2019-03-05 NOTE — Progress Notes (Signed)
I have personally reviewed the Medicare Annual Wellness Visit and agree with the assessment and plan.  Algis Greenhouse. [name], MD 03/05/2019 11:59 AM

## 2019-03-07 DIAGNOSIS — M25512 Pain in left shoulder: Secondary | ICD-10-CM | POA: Diagnosis not present

## 2019-03-13 ENCOUNTER — Ambulatory Visit (INDEPENDENT_AMBULATORY_CARE_PROVIDER_SITE_OTHER): Payer: Medicare Other | Admitting: Orthopedic Surgery

## 2019-03-13 ENCOUNTER — Other Ambulatory Visit: Payer: Self-pay

## 2019-03-13 DIAGNOSIS — S46012A Strain of muscle(s) and tendon(s) of the rotator cuff of left shoulder, initial encounter: Secondary | ICD-10-CM

## 2019-03-15 ENCOUNTER — Encounter: Payer: Self-pay | Admitting: Orthopedic Surgery

## 2019-03-15 NOTE — Progress Notes (Signed)
Post-Op Visit Note   Patient: [name] [name]           Date of Birth: Jul 08, 1950           MRN: [id number] Visit Date: 03/13/2019 PCP: Vivi Barrack, MD   Assessment & Plan:  Chief Complaint:  Chief Complaint  Patient presents with  . Left Shoulder - Follow-up   Visit Diagnoses: No diagnosis found.  Plan: Patient is a 68 year old male who presents to the office s/p biceps tenodesis and rotator cuff repair on 02/04/2019.  He states he is doing okay and has remained in his sling since the last visit despite being told he may discontinue it.  His pain is doing well and he is not taking pain medications anymore.  His pain does interfere with his sleep so he chooses to sleep in recliner.  He is off the CPM machine which she was up to 105 degrees.  He is going to physical therapy 2 times a week and working on range of motion.  On exam his range of motion is lacking and he is quite stiff.  I recommended that patient spend 1.5 hours a day at least working on his range of motion.  Recommended that he purchase a shoulder pulley to work on forward flexion especially.  He should hold off on ultrasound strengthening with his therapist until he has excellent range of motion.  Patient agreed with this plan and will follow-up in 6 weeks for clinical recheck.  Follow-Up Instructions: No follow-ups on file.   Orders:  No orders of the defined types were placed in this encounter.  No orders of the defined types were placed in this encounter.   Imaging: No results found.  PMFS History: Patient Active Problem List   Diagnosis Date Noted  . Labral tear of long head of biceps tendon, left, sequela   . Complete tear of left rotator cuff   . Diverticulosis of colon (without mention of hemorrhage) 04/25/2011  . External thrombosed hemorrhoids 04/25/2011  . Adenomatous colon polyp 03/17/2011  . CAD, NATIVE VESSEL 11/25/2009  . LOW BACK PAIN, CHRONIC 12/13/2007  . Hyperlipidemia associated with type  2 diabetes mellitus ([place]) 06/08/2007  . T2DM (type 2 diabetes mellitus) ([place]) 02/27/2007  . Hypertension associated with diabetes ([place]) 02/27/2007  . AMBLYOPIA 12/06/2006  . GERD 12/06/2006   Past Medical History:  Diagnosis Date  . Adenomatous colon polyp 09/07/2004  . Amblyopia   . CAD (coronary artery disease)    cardiac CT with a 50% LAD lesion in 2007  . Diverticulosis   . DM2 (diabetes mellitus, type 2) ([place])   . Elevated LFTs    secondary to labetalol  . Esophageal stricture 2003  . External hemorrhoids 2012   lanced by Dr Rise Patience  . Family history of anesthesia complication    "sister had PONV"  . GERD (gastroesophageal reflux disease)    esophageal stricture, pmh  . Hiatal hernia 01/04/00  . HLD (hyperlipidemia)   . HTN (hypertension)   . Hypopotassemia     Family History  Problem Relation Age of Onset  . Heart attack Mother 34  . Stroke Mother 40  . Prostate cancer Father   . Diabetes Father   . Heart disease Father 33       CBAG   . Diabetes Brother   . Heart attack Brother   . Breast cancer Maternal Aunt   . Heart attack Maternal Grandfather 61  . Diabetes Paternal Grandmother   .  Stroke Paternal Grandfather 70  . Heart disease Sister     Past Surgical History:  Procedure Laterality Date  . BACK SURGERY    . CARDIAC CATHETERIZATION  2007; 03/26/2014  . COLONOSCOPY  2006   tics , ADENOMATOUS polyps. F/U due 2011  . COLONOSCOPY  2012   Tics  . CORONARY ANGIOPLASTY WITH STENT PLACEMENT  04/01/2014   "2"  . ESOPHAGEAL DILATION  2003  . ESOPHAGOGASTRODUODENOSCOPY  2003   ERD, stricture dilated  . EXCISIONAL HEMORRHOIDECTOMY  ~ 2008  . LEFT HEART CATHETERIZATION WITH CORONARY ANGIOGRAM N/A 03/26/2014   Procedure: LEFT HEART CATHETERIZATION WITH CORONARY ANGIOGRAM;  Surgeon: Peter M Martinique, MD;  Location[facility] CATH LAB;  Service: Cardiovascular;  Laterality: N/A;  . PERCUTANEOUS CORONARY STENT INTERVENTION (PCI-S) N/A 04/01/2014   Procedure: PERCUTANEOUS  CORONARY STENT INTERVENTION (PCI-S);  Surgeon: Peter M Martinique, MD;  Location: [facility] CATH LAB;  Service: Cardiovascular;  Laterality: N/A;  . POSTERIOR LAMINECTOMY / DECOMPRESSION LUMBAR SPINE  12/2009   L4 , Dr Sherwood Gambler  . SHOULDER ARTHROSCOPY WITH ROTATOR CUFF REPAIR AND OPEN BICEPS TENODESIS Left 02/04/2019   Procedure: LEFT SHOULDER ARTHROSCOPY, BICEPS TENODESIS, MINI OPEN ROTATOR CUFF TEAR REPAIR;  Surgeon: Meredith Pel, MD;  Location: [facility];  Service: Orthopedics;  Laterality: Left;   Social History   Occupational History  . Occupation: Retired   Tobacco Use  . Smoking status: Never Smoker  . Smokeless tobacco: Never Used  Substance and Sexual Activity  . Alcohol use: Yes    Alcohol/week: 2.0 standard drinks    Types: 2 Cans of beer per week    Comment: social  . Drug use: No  . Sexual activity: Not Currently

## 2019-03-19 DIAGNOSIS — M25512 Pain in left shoulder: Secondary | ICD-10-CM | POA: Diagnosis not present

## 2019-03-21 DIAGNOSIS — M25512 Pain in left shoulder: Secondary | ICD-10-CM | POA: Diagnosis not present

## 2019-03-25 DIAGNOSIS — M25512 Pain in left shoulder: Secondary | ICD-10-CM | POA: Diagnosis not present

## 2019-03-27 DIAGNOSIS — M75112 Incomplete rotator cuff tear or rupture of left shoulder, not specified as traumatic: Secondary | ICD-10-CM | POA: Diagnosis not present

## 2019-03-27 DIAGNOSIS — M25512 Pain in left shoulder: Secondary | ICD-10-CM | POA: Diagnosis not present

## 2019-04-01 DIAGNOSIS — M25512 Pain in left shoulder: Secondary | ICD-10-CM | POA: Diagnosis not present

## 2019-04-01 DIAGNOSIS — M75112 Incomplete rotator cuff tear or rupture of left shoulder, not specified as traumatic: Secondary | ICD-10-CM | POA: Diagnosis not present

## 2019-04-03 DIAGNOSIS — M25512 Pain in left shoulder: Secondary | ICD-10-CM | POA: Diagnosis not present

## 2019-04-03 DIAGNOSIS — M75112 Incomplete rotator cuff tear or rupture of left shoulder, not specified as traumatic: Secondary | ICD-10-CM | POA: Diagnosis not present

## 2019-04-04 ENCOUNTER — Other Ambulatory Visit: Payer: Self-pay | Admitting: Cardiology

## 2019-04-09 DIAGNOSIS — M75112 Incomplete rotator cuff tear or rupture of left shoulder, not specified as traumatic: Secondary | ICD-10-CM | POA: Diagnosis not present

## 2019-04-09 DIAGNOSIS — M25512 Pain in left shoulder: Secondary | ICD-10-CM | POA: Diagnosis not present

## 2019-04-11 DIAGNOSIS — M25512 Pain in left shoulder: Secondary | ICD-10-CM | POA: Diagnosis not present

## 2019-04-11 DIAGNOSIS — M75112 Incomplete rotator cuff tear or rupture of left shoulder, not specified as traumatic: Secondary | ICD-10-CM | POA: Diagnosis not present

## 2019-04-16 DIAGNOSIS — M25512 Pain in left shoulder: Secondary | ICD-10-CM | POA: Diagnosis not present

## 2019-04-16 DIAGNOSIS — M75112 Incomplete rotator cuff tear or rupture of left shoulder, not specified as traumatic: Secondary | ICD-10-CM | POA: Diagnosis not present

## 2019-04-18 DIAGNOSIS — M75112 Incomplete rotator cuff tear or rupture of left shoulder, not specified as traumatic: Secondary | ICD-10-CM | POA: Diagnosis not present

## 2019-04-18 DIAGNOSIS — M25512 Pain in left shoulder: Secondary | ICD-10-CM | POA: Diagnosis not present

## 2019-04-22 DIAGNOSIS — M25512 Pain in left shoulder: Secondary | ICD-10-CM | POA: Diagnosis not present

## 2019-04-22 DIAGNOSIS — M75112 Incomplete rotator cuff tear or rupture of left shoulder, not specified as traumatic: Secondary | ICD-10-CM | POA: Diagnosis not present

## 2019-04-24 ENCOUNTER — Ambulatory Visit: Payer: Medicare Other | Admitting: Orthopedic Surgery

## 2019-04-24 DIAGNOSIS — H0102B Squamous blepharitis left eye, upper and lower eyelids: Secondary | ICD-10-CM | POA: Diagnosis not present

## 2019-04-24 DIAGNOSIS — H04123 Dry eye syndrome of bilateral lacrimal glands: Secondary | ICD-10-CM | POA: Diagnosis not present

## 2019-04-24 DIAGNOSIS — E119 Type 2 diabetes mellitus without complications: Secondary | ICD-10-CM | POA: Diagnosis not present

## 2019-04-24 DIAGNOSIS — H0102A Squamous blepharitis right eye, upper and lower eyelids: Secondary | ICD-10-CM | POA: Diagnosis not present

## 2019-04-24 DIAGNOSIS — H2513 Age-related nuclear cataract, bilateral: Secondary | ICD-10-CM | POA: Diagnosis not present

## 2019-04-24 DIAGNOSIS — H25043 Posterior subcapsular polar age-related cataract, bilateral: Secondary | ICD-10-CM | POA: Diagnosis not present

## 2019-04-24 DIAGNOSIS — H40013 Open angle with borderline findings, low risk, bilateral: Secondary | ICD-10-CM | POA: Diagnosis not present

## 2019-04-24 DIAGNOSIS — M25512 Pain in left shoulder: Secondary | ICD-10-CM | POA: Diagnosis not present

## 2019-04-24 DIAGNOSIS — M75112 Incomplete rotator cuff tear or rupture of left shoulder, not specified as traumatic: Secondary | ICD-10-CM | POA: Diagnosis not present

## 2019-04-24 DIAGNOSIS — H25013 Cortical age-related cataract, bilateral: Secondary | ICD-10-CM | POA: Diagnosis not present

## 2019-04-24 DIAGNOSIS — H11153 Pinguecula, bilateral: Secondary | ICD-10-CM | POA: Diagnosis not present

## 2019-04-24 DIAGNOSIS — H18413 Arcus senilis, bilateral: Secondary | ICD-10-CM | POA: Diagnosis not present

## 2019-04-29 DIAGNOSIS — M75112 Incomplete rotator cuff tear or rupture of left shoulder, not specified as traumatic: Secondary | ICD-10-CM | POA: Diagnosis not present

## 2019-04-29 DIAGNOSIS — M25512 Pain in left shoulder: Secondary | ICD-10-CM | POA: Diagnosis not present

## 2019-05-01 DIAGNOSIS — M75112 Incomplete rotator cuff tear or rupture of left shoulder, not specified as traumatic: Secondary | ICD-10-CM | POA: Diagnosis not present

## 2019-05-01 DIAGNOSIS — M25512 Pain in left shoulder: Secondary | ICD-10-CM | POA: Diagnosis not present

## 2019-05-07 DIAGNOSIS — M25512 Pain in left shoulder: Secondary | ICD-10-CM | POA: Diagnosis not present

## 2019-05-07 DIAGNOSIS — M75112 Incomplete rotator cuff tear or rupture of left shoulder, not specified as traumatic: Secondary | ICD-10-CM | POA: Diagnosis not present

## 2019-05-08 ENCOUNTER — Encounter: Payer: Self-pay | Admitting: Orthopedic Surgery

## 2019-05-08 ENCOUNTER — Other Ambulatory Visit: Payer: Self-pay

## 2019-05-08 ENCOUNTER — Ambulatory Visit (INDEPENDENT_AMBULATORY_CARE_PROVIDER_SITE_OTHER): Payer: Medicare Other | Admitting: Orthopedic Surgery

## 2019-05-08 DIAGNOSIS — S46012A Strain of muscle(s) and tendon(s) of the rotator cuff of left shoulder, initial encounter: Secondary | ICD-10-CM

## 2019-05-09 ENCOUNTER — Encounter: Payer: Self-pay | Admitting: Orthopedic Surgery

## 2019-05-09 DIAGNOSIS — M75112 Incomplete rotator cuff tear or rupture of left shoulder, not specified as traumatic: Secondary | ICD-10-CM | POA: Diagnosis not present

## 2019-05-09 DIAGNOSIS — M25512 Pain in left shoulder: Secondary | ICD-10-CM | POA: Diagnosis not present

## 2019-05-09 NOTE — Progress Notes (Signed)
Post-Op Visit Note   Patient: [name] [name]           Date of Birth: 04-Oct-1950           MRN: [id number] Visit Date: 05/08/2019 PCP: [name] Barrack, MD   Assessment & Plan:  Chief Complaint:  Chief Complaint  Patient presents with  . Left Shoulder - Follow-up   Visit Diagnoses:  1. Traumatic complete tear of left rotator cuff, initial encounter     Plan: [name] [name] is a patient who is now about 3 months out left shoulder biceps tenodesis in mini open rotator cuff tear repair.  He has been doing well.  He is able to sleep in his bed.  Still going to physical therapy.  Describes increased range of motion.  He states that the shoulder really turned the corner over the last 3 months.  On examination he has very good forward flexion equal to the right-hand side as well as improving strength to infraspinatus and supraspinatus testing.  On him to continue therapy 2 times a week for 4 more weeks and anticipate return to golf gradually in the late winter early spring.  Follow-up with me as needed  Follow-Up Instructions: Return if symptoms worsen or fail to improve.   Orders:  No orders of the defined types were placed in this encounter.  No orders of the defined types were placed in this encounter.   Imaging: No results found.  PMFS History: Patient Active Problem List   Diagnosis Date Noted  . Labral tear of long head of biceps tendon, left, sequela   . Complete tear of left rotator cuff   . Diverticulosis of colon (without mention of hemorrhage) 04/25/2011  . External thrombosed hemorrhoids 04/25/2011  . Adenomatous colon polyp 03/17/2011  . CAD, NATIVE VESSEL 11/25/2009  . LOW BACK PAIN, CHRONIC 12/13/2007  . Hyperlipidemia associated with type 2 diabetes mellitus ([place]) 06/08/2007  . T2DM (type 2 diabetes mellitus) ([place]) 02/27/2007  . Hypertension associated with diabetes ([place]) 02/27/2007  . AMBLYOPIA 12/06/2006  . GERD 12/06/2006   Past Medical History:  Diagnosis Date   . Adenomatous colon polyp 09/07/2004  . Amblyopia   . CAD (coronary artery disease)    cardiac CT with a 50% LAD lesion in 2007  . Diverticulosis   . DM2 (diabetes mellitus, type 2) ([place])   . Elevated LFTs    secondary to labetalol  . Esophageal stricture 2003  . External hemorrhoids 2012   lanced by Dr Rise Patience  . Family history of anesthesia complication    "sister had PONV"  . GERD (gastroesophageal reflux disease)    esophageal stricture, pmh  . Hiatal hernia 01/04/00  . HLD (hyperlipidemia)   . HTN (hypertension)   . Hypopotassemia     Family History  Problem Relation Age of Onset  . Heart attack Mother 13  . Stroke Mother 55  . Prostate cancer Father   . Diabetes Father   . Heart disease Father 86       CBAG   . Diabetes Brother   . Heart attack Brother   . Breast cancer Maternal Aunt   . Heart attack Maternal Grandfather 61  . Diabetes Paternal Grandmother   . Stroke Paternal Grandfather 49  . Heart disease Sister     Past Surgical History:  Procedure Laterality Date  . BACK SURGERY    . CARDIAC CATHETERIZATION  2007; 03/26/2014  . COLONOSCOPY  2006   tics , ADENOMATOUS polyps. F/U due  2011  . COLONOSCOPY  2012   Tics  . CORONARY ANGIOPLASTY WITH STENT PLACEMENT  04/01/2014   "2"  . ESOPHAGEAL DILATION  2003  . ESOPHAGOGASTRODUODENOSCOPY  2003   ERD, stricture dilated  . EXCISIONAL HEMORRHOIDECTOMY  ~ 2008  . LEFT HEART CATHETERIZATION WITH CORONARY ANGIOGRAM N/A 03/26/2014   Procedure: LEFT HEART CATHETERIZATION WITH CORONARY ANGIOGRAM;  Surgeon: Peter M Martinique, MD;  Location: [facility] CATH LAB;  Service: Cardiovascular;  Laterality: N/A;  . PERCUTANEOUS CORONARY STENT INTERVENTION (PCI-S) N/A 04/01/2014   Procedure: PERCUTANEOUS CORONARY STENT INTERVENTION (PCI-S);  Surgeon: Peter M Martinique, MD;  Location: [facility] CATH LAB;  Service: Cardiovascular;  Laterality: N/A;  . POSTERIOR LAMINECTOMY / DECOMPRESSION LUMBAR SPINE  12/2009   L4 , Dr Sherwood Gambler  . SHOULDER  ARTHROSCOPY WITH ROTATOR CUFF REPAIR AND OPEN BICEPS TENODESIS Left 02/04/2019   Procedure: LEFT SHOULDER ARTHROSCOPY, BICEPS TENODESIS, MINI OPEN ROTATOR CUFF TEAR REPAIR;  Surgeon: Meredith Pel, MD;  Location: [facility];  Service: Orthopedics;  Laterality: Left;   Social History   Occupational History  . Occupation: Retired   Tobacco Use  . Smoking status: Never Smoker  . Smokeless tobacco: Never Used  Substance and Sexual Activity  . Alcohol use: Yes    Alcohol/week: 2.0 standard drinks    Types: 2 Cans of beer per week    Comment: social  . Drug use: No  . Sexual activity: Not Currently

## 2019-05-14 DIAGNOSIS — M25512 Pain in left shoulder: Secondary | ICD-10-CM | POA: Diagnosis not present

## 2019-05-14 DIAGNOSIS — M75112 Incomplete rotator cuff tear or rupture of left shoulder, not specified as traumatic: Secondary | ICD-10-CM | POA: Diagnosis not present

## 2019-05-16 DIAGNOSIS — M75112 Incomplete rotator cuff tear or rupture of left shoulder, not specified as traumatic: Secondary | ICD-10-CM | POA: Diagnosis not present

## 2019-05-16 DIAGNOSIS — M25512 Pain in left shoulder: Secondary | ICD-10-CM | POA: Diagnosis not present

## 2019-05-17 DIAGNOSIS — I10 Essential (primary) hypertension: Secondary | ICD-10-CM | POA: Diagnosis not present

## 2019-05-17 DIAGNOSIS — S4991XA Unspecified injury of right shoulder and upper arm, initial encounter: Secondary | ICD-10-CM | POA: Diagnosis not present

## 2019-05-17 DIAGNOSIS — R0789 Other chest pain: Secondary | ICD-10-CM | POA: Diagnosis not present

## 2019-05-17 DIAGNOSIS — E119 Type 2 diabetes mellitus without complications: Secondary | ICD-10-CM | POA: Diagnosis not present

## 2019-05-17 DIAGNOSIS — M25511 Pain in right shoulder: Secondary | ICD-10-CM | POA: Diagnosis not present

## 2019-05-21 DIAGNOSIS — M75112 Incomplete rotator cuff tear or rupture of left shoulder, not specified as traumatic: Secondary | ICD-10-CM | POA: Diagnosis not present

## 2019-05-21 DIAGNOSIS — M25512 Pain in left shoulder: Secondary | ICD-10-CM | POA: Diagnosis not present

## 2019-05-28 ENCOUNTER — Other Ambulatory Visit: Payer: Self-pay

## 2019-05-30 ENCOUNTER — Other Ambulatory Visit: Payer: Self-pay

## 2019-05-30 ENCOUNTER — Other Ambulatory Visit: Payer: Medicare Other

## 2019-05-30 ENCOUNTER — Ambulatory Visit (INDEPENDENT_AMBULATORY_CARE_PROVIDER_SITE_OTHER): Payer: Medicare Other | Admitting: Family Medicine

## 2019-05-30 ENCOUNTER — Ambulatory Visit (INDEPENDENT_AMBULATORY_CARE_PROVIDER_SITE_OTHER): Payer: Medicare Other

## 2019-05-30 ENCOUNTER — Encounter: Payer: Self-pay | Admitting: Family Medicine

## 2019-05-30 VITALS — BP 119/78 | HR 64 | Temp 97.2°F | Ht 69.0 in | Wt 186.2 lb

## 2019-05-30 DIAGNOSIS — M79605 Pain in left leg: Secondary | ICD-10-CM

## 2019-05-30 DIAGNOSIS — M75112 Incomplete rotator cuff tear or rupture of left shoulder, not specified as traumatic: Secondary | ICD-10-CM | POA: Diagnosis not present

## 2019-05-30 DIAGNOSIS — M25511 Pain in right shoulder: Secondary | ICD-10-CM

## 2019-05-30 DIAGNOSIS — M79604 Pain in right leg: Secondary | ICD-10-CM

## 2019-05-30 DIAGNOSIS — M25512 Pain in left shoulder: Secondary | ICD-10-CM | POA: Diagnosis not present

## 2019-05-30 DIAGNOSIS — S8992XA Unspecified injury of left lower leg, initial encounter: Secondary | ICD-10-CM | POA: Diagnosis not present

## 2019-05-30 DIAGNOSIS — M19012 Primary osteoarthritis, left shoulder: Secondary | ICD-10-CM | POA: Diagnosis not present

## 2019-05-30 DIAGNOSIS — M19011 Primary osteoarthritis, right shoulder: Secondary | ICD-10-CM | POA: Diagnosis not present

## 2019-05-30 DIAGNOSIS — R58 Hemorrhage, not elsewhere classified: Secondary | ICD-10-CM

## 2019-05-30 DIAGNOSIS — S8991XA Unspecified injury of right lower leg, initial encounter: Secondary | ICD-10-CM | POA: Diagnosis not present

## 2019-05-30 NOTE — Patient Instructions (Signed)
It was very nice to see you today!  We will get xrays today.  Let me know if your pain does not continue to improve over the next few weeks.   Take care, Dr [name]  Please try these tips to maintain a healthy lifestyle:   Eat at least 3 REAL meals and 1-2 snacks per day.  Aim for no more than 5 hours between eating.  If you eat breakfast, please do so within one hour of getting up.    Obtain twice as many fruits/vegetables as protein or carbohydrate foods for both lunch and dinner. (Half of each meal should be fruits/vegetables, one quarter protein, and one quarter starchy carbs)   Cut down on sweet beverages. This includes juice, soda, and sweet tea.    Exercise at least 150 minutes every week.
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
## 2019-05-31 NOTE — Progress Notes (Signed)
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 Like we discussed during his office visit, I think he probably has a mild AC joint sprain. This should improve over the next week or two. Would like for him to let us know if not.  [name] [name]. [name], MD 05/31/2019 7:54 AM

## 2019-06-05 DIAGNOSIS — M25512 Pain in left shoulder: Secondary | ICD-10-CM | POA: Diagnosis not present

## 2019-06-05 DIAGNOSIS — M75112 Incomplete rotator cuff tear or rupture of left shoulder, not specified as traumatic: Secondary | ICD-10-CM | POA: Diagnosis not present

## 2019-06-10 ENCOUNTER — Encounter: Payer: Self-pay | Admitting: Family Medicine

## 2019-06-11 ENCOUNTER — Encounter: Payer: Self-pay | Admitting: Family Medicine

## 2019-06-11 NOTE — Telephone Encounter (Signed)
Spoke with patient he will view imaging,if any question he will send a message via [employer]

## 2019-06-13 ENCOUNTER — Encounter: Payer: Self-pay | Admitting: Family Medicine

## 2019-06-13 ENCOUNTER — Ambulatory Visit (INDEPENDENT_AMBULATORY_CARE_PROVIDER_SITE_OTHER): Payer: Medicare Other | Admitting: Family Medicine

## 2019-06-13 DIAGNOSIS — R319 Hematuria, unspecified: Secondary | ICD-10-CM

## 2019-06-13 DIAGNOSIS — M79604 Pain in right leg: Secondary | ICD-10-CM

## 2019-06-13 DIAGNOSIS — K5900 Constipation, unspecified: Secondary | ICD-10-CM | POA: Diagnosis not present

## 2019-06-13 DIAGNOSIS — Z1211 Encounter for screening for malignant neoplasm of colon: Secondary | ICD-10-CM

## 2019-06-13 DIAGNOSIS — M79605 Pain in left leg: Secondary | ICD-10-CM

## 2019-06-13 DIAGNOSIS — M25511 Pain in right shoulder: Secondary | ICD-10-CM
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
## 2019-07-02 ENCOUNTER — Other Ambulatory Visit: Payer: Self-pay | Admitting: Family Medicine

## 2019-07-02 NOTE — Telephone Encounter (Signed)
Copied from [place] [id number]. Topic: Quick Communication - Rx Refill/Question >> Jul 02, 2019  9:48 AM Rainey Pines A wrote: Medication:irbesartan (AVAPRO) 300 MG tablet ,carvedilol (COREG) 3.125 MG tablet ,clopidogrel (PLAVIX) 75 MG tablet ,spironolactone (ALDACTONE) 25 MG tablet ,atorvastatin (LIPITOR) 80 MG tablet ,metFORMIN (GLUCOPHAGE) 500 MG tablet (Patient would like all medication sent to new pharmacy and filled. Patient is out of medications.)  Has the patient contacted their pharmacy? Yes (Agent: If no, request that the patient contact the pharmacy for the refill.) (Agent: If yes, when and what did the pharmacy advise?)Contact PCP  Preferred Pharmacy (with phone number or street name): [facility], [place]  Phone:  [phone] Fax:  [phone]     Agent: Please be advised that RX refills may take up to 3 business days. We ask that you follow-up with your pharmacy.

## 2019-07-02 NOTE — Telephone Encounter (Signed)
Requested medication (s) are due for refill today: yes  Requested medication (s) are on the active medication list: yes    Future visit scheduled: no  Notes to clinic:  Last ordered: 6 months ago by Peter M Martinique, MD Review for refill Last filled by different provider    Requested Prescriptions  Pending Prescriptions Disp Refills   carvedilol (COREG) 3.125 MG tablet 180 tablet 0    Sig: Take 1 tablet by mouth twice a day with meals      There is no refill protocol information for this order      clopidogrel (PLAVIX) 75 MG tablet 90 tablet 2    Sig: Take 1 tablet (75 mg total) by mouth daily.      There is no refill protocol information for this order      atorvastatin (LIPITOR) 80 MG tablet 90 tablet 2    Sig: Take 1 tablet by mouth every day at 6 pm      There is no refill protocol information for this order      metFORMIN (GLUCOPHAGE) 500 MG tablet 180 tablet 3    Sig: Take 1 tablet by mouth  twice a day with a meal      There is no refill protocol information for this order      irbesartan (AVAPRO) 300 MG tablet 90 tablet 0    Sig: Take 1 tablet (300 mg total) by mouth daily.      There is no refill protocol information for this order

## 2019-07-09 ENCOUNTER — Other Ambulatory Visit: Payer: Self-pay

## 2019-07-09 MED ORDER — SPIRONOLACTONE 25 MG PO TABS: 25.0000 mg | ORAL_TABLET | Freq: Every day | ORAL | 2 refills | Status: DC

## 2019-07-09 MED ORDER — ATORVASTATIN CALCIUM 80 MG PO TABS: ORAL_TABLET | ORAL | 2 refills | Status: DC

## 2019-07-09 MED ORDER — CARVEDILOL 3.125 MG PO TABS: ORAL_TABLET | ORAL | 0 refills | Status: DC

## 2019-07-09 MED ORDER — CLOPIDOGREL BISULFATE 75 MG PO TABS: 75.0000 mg | ORAL_TABLET | Freq: Every day | ORAL | 2 refills | Status: DC

## 2019-07-09 MED ORDER — METFORMIN HCL 500 MG PO TABS: ORAL_TABLET | ORAL | 3 refills | Status: DC

## 2019-07-09 MED ORDER — IRBESARTAN 300 MG PO TABS: 300.0000 mg | ORAL_TABLET | Freq: Every day | ORAL | 0 refills | Status: DC

## 2019-07-11 ENCOUNTER — Other Ambulatory Visit: Payer: Self-pay

## 2019-07-11 ENCOUNTER — Ambulatory Visit (INDEPENDENT_AMBULATORY_CARE_PROVIDER_SITE_OTHER): Payer: Medicare Other | Admitting: Orthopedic Surgery

## 2019-07-11 ENCOUNTER — Encounter: Payer: Self-pay | Admitting: Orthopedic Surgery

## 2019-07-11 DIAGNOSIS — M25512 Pain in left shoulder: Secondary | ICD-10-CM | POA: Diagnosis not present
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
## 2019-07-22 ENCOUNTER — Encounter: Payer: Self-pay | Admitting: Family Medicine

## 2019-07-23 ENCOUNTER — Other Ambulatory Visit: Payer: Self-pay

## 2019-07-23 ENCOUNTER — Ambulatory Visit: Payer: Medicare Other

## 2019-07-23 MED ORDER — SILDENAFIL CITRATE 20 MG PO TABS: ORAL_TABLET | ORAL | 0 refills | Status: DC

## 2019-08-01 ENCOUNTER — Ambulatory Visit: Payer: Medicare Other | Attending: Internal Medicine

## 2019-08-01 DIAGNOSIS — Z23 Encounter for immunization: Secondary | ICD-10-CM

## 2019-08-01 NOTE — Progress Notes (Signed)
   Covid-19 Vaccination Clinic  Name:  [name] [name]    MRN: [id number] DOB: 1951/04/20  08/01/2019  [name] [name] was observed post Covid-19 immunization for 15 minutes without incidence. He was provided with Vaccine Information Sheet and instruction to access the V-Safe system.   [name] [name] was instructed to call 911 with any severe reactions post vaccine: Marland Kitchen Difficulty breathing  . Swelling of your face and throat  . A fast heartbeat  . A bad rash all over your body  . Dizziness and weakness    Immunizations Administered    Name Date Dose VIS Date Route   Pfizer COVID-19 Vaccine 08/01/2019  5:49 PM 0.3 mL 06/07/2019 Intramuscular   Manufacturer: [place]   Lot: [id number]   [place]: [id number]

## 2019-08-20 ENCOUNTER — Encounter: Payer: Self-pay | Admitting: Orthopedic Surgery

## 2019-08-20 DIAGNOSIS — M25512 Pain in left shoulder: Secondary | ICD-10-CM

## 2019-08-20 DIAGNOSIS — M25511 Pain in right shoulder: Secondary | ICD-10-CM

## 2019-08-20 NOTE — Telephone Encounter (Signed)
We could do that as bilateral nonarthrogram MRI; however, it will be very difficult to assign causality especially in the unaffected unoperated shoulder if in fact it does have what appears to be a degenerative rotator cuff tear.  You can let him know that that is the quandary we are facing with more information.

## 2019-08-21 ENCOUNTER — Other Ambulatory Visit: Payer: Self-pay | Admitting: Surgical

## 2019-08-21 MED ORDER — DIAZEPAM 2 MG PO TABS: ORAL_TABLET | ORAL | 0 refills | Status: DC

## 2019-08-26 NOTE — Telephone Encounter (Signed)
I emailed pam mann and Clarise Cruz brown with [facility] advising order in workq and see if can get scheduled asap.

## 2019-08-27 ENCOUNTER — Ambulatory Visit: Payer: Medicare Other | Attending: Internal Medicine

## 2019-08-27 ENCOUNTER — Other Ambulatory Visit: Payer: Self-pay

## 2019-08-27 DIAGNOSIS — Z23 Encounter for immunization: Secondary | ICD-10-CM | POA: Insufficient documentation

## 2019-08-27 NOTE — Progress Notes (Signed)
   Covid-19 Vaccination Clinic  Name:  [name] [name]    MRN: [id number] DOB: August 25, 1950  08/27/2019  [name] [name] was observed post Covid-19 immunization for 15 minutes without incident. He was provided with Vaccine Information Sheet and instruction to access the V-Safe system.   [name] [name] was instructed to call 911 with any severe reactions post vaccine: Marland Kitchen Difficulty breathing  . Swelling of face and throat  . A fast heartbeat  . A bad rash all over body  . Dizziness and weakness   Immunizations Administered    Name Date Dose VIS Date Route   Pfizer COVID-19 Vaccine 08/27/2019  8:49 AM 0.3 mL 06/07/2019 Intramuscular   Manufacturer: [place]   Lot: [id number]   [place]: [id number]

## 2019-08-28 ENCOUNTER — Ambulatory Visit (INDEPENDENT_AMBULATORY_CARE_PROVIDER_SITE_OTHER): Payer: Medicare Other | Admitting: Family Medicine

## 2019-08-28 ENCOUNTER — Encounter: Payer: Self-pay | Admitting: Family Medicine

## 2019-08-28 VITALS — BP 124/72 | HR 57 | Temp 97.7°F | Ht 69.0 in | Wt 191.0 lb

## 2019-08-28 DIAGNOSIS — M25561 Pain in right knee: Secondary | ICD-10-CM | POA: Diagnosis not present

## 2019-08-28 DIAGNOSIS — E1159 Type 2 diabetes mellitus with other circulatory complications: Secondary | ICD-10-CM | POA: Diagnosis not present

## 2019-08-28 DIAGNOSIS — I10 Essential (primary) hypertension: Secondary | ICD-10-CM

## 2019-08-28 DIAGNOSIS — I152 Hypertension secondary to endocrine disorders: Secondary | ICD-10-CM

## 2019-08-28 DIAGNOSIS — E1165 Type 2 diabetes mellitus with hyperglycemia: Secondary | ICD-10-CM | POA: Diagnosis not present

## 2019-08-28 LAB — POCT GLYCOSYLATED HEMOGLOBIN (HGB A1C): Hemoglobin A1C: 7.2 % — AB (ref 4.0–5.6)

## 2019-08-28 NOTE — Assessment & Plan Note (Signed)
At goal. Continue Coreg 3.125 mg twice daily, irbesartan 300 mg daily, spironolactone 25 mg daily.

## 2019-08-28 NOTE — Progress Notes (Signed)
   [name] [name] is a 69 y.o. male who presents today for an office visit.  Assessment/Plan:  New/Acute Problems: Right knee pain Likely has underlying osteoarthritis though may have some meniscal pathology as well.  He will be following with orthopedics.  Recommended conservative management including neoprene compression sleeve and ice daily.  Chronic Problems Addressed Today: Hypertension associated with diabetes ([place]) At goal. Continue Coreg 3.125 mg twice daily, irbesartan 300 mg daily, spironolactone 25 mg daily.  T2DM (type 2 diabetes mellitus) ([facility]) A1c stable at 7.2. He would like to continue Metformin 500 mg twice daily for the time being. He will follow-up in 6 months and will recheck A1c at that point.     Subjective:  HPI:  He is having some issues with right knee pain.  Is been present for quite a while.  He has seen physical therapy which is helped modestly.  He is concerned about possible meniscal tear.  He is considering seeing orthopedic soon for this.  Pain is worse with movement.  Worse with standing.  Occasionally worse with walking.  He is otherwise doing well.  Has not had any medication changes since her last visit.       Objective:  Physical Exam: BP 124/72   Pulse (!) 57   Temp 97.7 F (36.5 C)   Ht 5\' 9"  (1.753 m)   Wt 191 lb (86.6 kg)   SpO2 97%   BMI 28.21 kg/m   Wt Readings from Last 3 Encounters:  08/28/19 191 lb (86.6 kg)  05/30/19 186 lb 4 oz (84.5 kg)  03/05/19 181 lb (82.1 kg)  Gen: No acute distress, resting comfortably CV: Regular rate and rhythm with no murmurs appreciated Pulm: Normal work of breathing, clear to auscultation bilaterally with no crackles, wheezes, or rhonchi MSK: Right knee with slight effusion.  Tender to palpation along medial joint line.  Stable to varus valgus stress.  Neurovascular intact distally.  Crepitus noted with active and passive range of motion. Neuro: Grossly normal, moves all extremities Psych: Normal  affect and thought content      Azai Gaffin M. [name], MD 08/28/2019 11:02 AM

## 2019-08-28 NOTE — Assessment & Plan Note (Signed)
A1c stable at 7.2. He would like to continue Metformin 500 mg twice daily for the time being. He will follow-up in 6 months and will recheck A1c at that point.

## 2019-08-28 NOTE — Patient Instructions (Addendum)
It was very nice to see you today!  Please try using a neoprene compression sleeve and ice in your knee.  No medication changes today.  Come back in 6 months for your annual wellness visit with blood work, or sooner if needed.  Take care, Dr [name]  Please try these tips to maintain a healthy lifestyle:   Eat at least 3 REAL meals and 1-2 snacks per day.  Aim for no more than 5 hours between eating.  If you eat breakfast, please do so within one hour of getting up.    Each meal should contain half fruits/vegetables, one quarter protein, and one quarter carbs (no bigger than a computer mouse)   Cut down on sweet beverages. This includes juice, soda, and sweet tea.     Drink at least 1 glass of water with each meal and aim for at least 8 glasses per day   Exercise at least 150 minutes every week.

## 2019-09-02 DIAGNOSIS — M238X1 Other internal derangements of right knee: Secondary | ICD-10-CM | POA: Diagnosis not present

## 2019-09-02 DIAGNOSIS — M1711 Unilateral primary osteoarthritis, right knee: Secondary | ICD-10-CM | POA: Diagnosis not present

## 2019-09-02 DIAGNOSIS — M25561 Pain in right knee: Secondary | ICD-10-CM | POA: Diagnosis not present

## 2019-09-02 DIAGNOSIS — M2391 Unspecified internal derangement of right knee: Secondary | ICD-10-CM | POA: Diagnosis not present

## 2019-09-11 DIAGNOSIS — M2391 Unspecified internal derangement of right knee: Secondary | ICD-10-CM | POA: Diagnosis not present

## 2019-09-11 DIAGNOSIS — M25561 Pain in right knee: Secondary | ICD-10-CM | POA: Diagnosis not present

## 2019-09-11 DIAGNOSIS — M1711 Unilateral primary osteoarthritis, right knee: Secondary | ICD-10-CM | POA: Diagnosis not present
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
## 2019-09-17 ENCOUNTER — Encounter: Payer: Self-pay | Admitting: Cardiology

## 2019-09-17 ENCOUNTER — Other Ambulatory Visit: Payer: Self-pay

## 2019-09-17 ENCOUNTER — Ambulatory Visit (INDEPENDENT_AMBULATORY_CARE_PROVIDER_SITE_OTHER): Payer: Medicare Other | Admitting: Cardiology

## 2019-09-17 VITALS — BP 118/82 | HR 56 | Ht 69.5 in | Wt 192.4 lb

## 2019-09-17 DIAGNOSIS — I10 Essential (primary) hypertension: Secondary | ICD-10-CM

## 2019-09-17 DIAGNOSIS — E7800 Pure hypercholesterolemia, unspecified: Secondary | ICD-10-CM

## 2019-09-17 DIAGNOSIS — I25118 Atherosclerotic heart disease of native coronary artery with other forms of angina pectoris: Secondary | ICD-10-CM

## 2019-09-20 ENCOUNTER — Other Ambulatory Visit: Payer: Self-pay

## 2019-09-20 ENCOUNTER — Ambulatory Visit
Admission: RE | Admit: 2019-09-20 | Discharge: 2019-09-20 | Disposition: A | Discharge status: Home / Self Care | Payer: Medicare Other | Source: Ambulatory Visit | Attending: Orthopedic Surgery | Admitting: Orthopedic Surgery

## 2019-09-20 DIAGNOSIS — M25511 Pain in right shoulder: Secondary | ICD-10-CM

## 2019-09-20 DIAGNOSIS — S4992XA Unspecified injury of left shoulder and upper arm, initial encounter: Secondary | ICD-10-CM | POA: Diagnosis not present

## 2019-09-20 DIAGNOSIS — M25512 Pain in left shoulder: Secondary | ICD-10-CM | POA: Diagnosis not present

## 2019-09-24 ENCOUNTER — Other Ambulatory Visit: Payer: Self-pay | Admitting: Family Medicine

## 2019-09-25 ENCOUNTER — Ambulatory Visit (INDEPENDENT_AMBULATORY_CARE_PROVIDER_SITE_OTHER): Payer: Medicare Other | Admitting: Orthopedic Surgery

## 2019-09-25 ENCOUNTER — Other Ambulatory Visit: Payer: Self-pay

## 2019-09-25 DIAGNOSIS — S46012A Strain of muscle(s) and tendon(s) of the rotator cuff of left shoulder, initial encounter: Secondary | ICD-10-CM | POA: Diagnosis not present

## 2019-09-25 DIAGNOSIS — Z9889 Other specified postprocedural states: Secondary | ICD-10-CM | POA: Diagnosis not present

## 2019-09-25 DIAGNOSIS — S46011A Strain of muscle(s) and tendon(s) of the rotator cuff of right shoulder, initial encounter: Secondary | ICD-10-CM

## 2019-09-25 DIAGNOSIS — I25118 Atherosclerotic heart disease of native coronary artery with other forms of angina pectoris: Secondary | ICD-10-CM | POA: Diagnosis not present

## 2019-09-27 ENCOUNTER — Encounter: Payer: Self-pay | Admitting: Orthopedic Surgery
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
## 2019-10-21 ENCOUNTER — Encounter (HOSPITAL_COMMUNITY): Payer: Self-pay

## 2019-10-21 ENCOUNTER — Other Ambulatory Visit: Payer: Self-pay

## 2019-10-21 ENCOUNTER — Emergency Department (HOSPITAL_COMMUNITY)
Admission: EM | Admit: 2019-10-21 | Discharge: 2019-10-21 | Disposition: A | Discharge status: Home / Self Care | Payer: Medicare Other | Attending: Emergency Medicine | Admitting: Emergency Medicine

## 2019-10-21 ENCOUNTER — Encounter: Payer: Self-pay | Admitting: Family Medicine

## 2019-10-21 DIAGNOSIS — Z5321 Procedure and treatment not carried out due to patient leaving prior to being seen by health care provider: Secondary | ICD-10-CM | POA: Insufficient documentation

## 2019-10-21 DIAGNOSIS — R1032 Left lower quadrant pain: Secondary | ICD-10-CM | POA: Diagnosis not present

## 2019-10-21 LAB — CBC
HCT: 47.9 % (ref 39.0–52.0)
Hemoglobin: 16.3 g/dL (ref 13.0–17.0)
MCH: 34.1 pg — ABNORMAL HIGH (ref 26.0–34.0)
MCHC: 34 g/dL (ref 30.0–36.0)
MCV: 100.2 fL — ABNORMAL HIGH (ref 80.0–100.0)
Platelets: 239 10*3/uL (ref 150–400)
RBC: 4.78 MIL/uL (ref 4.22–5.81)
RDW: 13 % (ref 11.5–15.5)
WBC: 13.5 10*3/uL — ABNORMAL HIGH (ref 4.0–10.5)
nRBC: 0 % (ref 0.0–0.2)

## 2019-10-21 LAB — URINALYSIS, ROUTINE W REFLEX MICROSCOPIC
Bilirubin Urine: NEGATIVE
Glucose, UA: 50 mg/dL — AB
Ketones, ur: NEGATIVE mg/dL
Leukocytes,Ua: NEGATIVE
Nitrite: NEGATIVE
Protein, ur: NEGATIVE mg/dL
RBC / HPF: >50 RBC/hpf — ABNORMAL HIGH (ref 0–5)
Specific Gravity, Urine: 1.017 (ref 1.005–1.030)
pH: 5 (ref 5.0–8.0)

## 2019-10-21 LAB — COMPREHENSIVE METABOLIC PANEL
ALT: 23 U/L (ref 0–44)
AST: 20 U/L (ref 15–41)
Albumin: 4.5 g/dL (ref 3.5–5.0)
Alkaline Phosphatase: 85 U/L (ref 38–126)
Anion gap: 10 (ref 5–15)
BUN: 14 mg/dL (ref 8–23)
CO2: 25 mmol/L (ref 22–32)
Calcium: 9.8 mg/dL (ref 8.9–10.3)
Chloride: 104 mmol/L (ref 98–111)
Creatinine, Ser: 1.07 mg/dL (ref 0.61–1.24)
GFR calc Af Amer: >60 mL/min (ref >60)
GFR calc non Af Amer: >60 mL/min (ref >60)
Glucose, Bld: 150 mg/dL — ABNORMAL HIGH (ref 70–99)
Potassium: 4 mmol/L (ref 3.5–5.1)
Sodium: 139 mmol/L (ref 135–145)
Total Bilirubin: 0.7 mg/dL (ref 0.3–1.2)
Total Protein: 7.6 g/dL (ref 6.5–8.1)

## 2019-10-21 LAB — LIPASE, BLOOD: Lipase: 33 U/L (ref 11–51)

## 2019-10-21 MED ORDER — SODIUM CHLORIDE 0.9% FLUSH: 3.0000 mL | Freq: Once | INTRAVENOUS | Status: DC

## 2019-10-21 NOTE — ED Triage Notes (Signed)
Pt reports LLQ abdominal and L flank pain x 2 days. States that it started last night, but improved before worsening to what it is now. Denies N/V/D. No dysuria. A&Ox4.

## 2019-10-21 NOTE — ED Notes (Signed)
Pt told the screeners that he was leaving d/t wait time.

## 2019-10-22 ENCOUNTER — Other Ambulatory Visit: Payer: Self-pay

## 2019-10-22 ENCOUNTER — Ambulatory Visit (INDEPENDENT_AMBULATORY_CARE_PROVIDER_SITE_OTHER): Payer: Medicare Other | Admitting: Family Medicine

## 2019-10-22 ENCOUNTER — Encounter: Payer: Self-pay | Admitting: Family Medicine

## 2019-10-22 VITALS — BP 110/70 | HR 67 | Temp 98.0°F | Ht 69.0 in | Wt 188.4 lb

## 2019-10-22 DIAGNOSIS — E1159 Type 2 diabetes mellitus with other circulatory complications: Secondary | ICD-10-CM

## 2019-10-22 DIAGNOSIS — I10 Essential (primary) hypertension: Secondary | ICD-10-CM

## 2019-10-22 DIAGNOSIS — I152 Hypertension secondary to endocrine disorders: Secondary | ICD-10-CM

## 2019-10-22 DIAGNOSIS — E1165 Type 2 diabetes mellitus with hyperglycemia: Secondary | ICD-10-CM | POA: Diagnosis not present

## 2019-10-22 DIAGNOSIS — R319 Hematuria, unspecified: Secondary | ICD-10-CM | POA: Diagnosis not present

## 2019-10-22 LAB — POC URINALSYSI DIPSTICK (AUTOMATED)
Bilirubin, UA: POSITIVE
Blood, UA: NEGATIVE
Glucose, UA: POSITIVE — AB
Ketones, UA: POSITIVE
Leukocytes, UA: NEGATIVE
Nitrite, UA: NEGATIVE
Protein, UA: POSITIVE — AB
Spec Grav, UA: >=1.03 — AB (ref 1.010–1.025)
Urobilinogen, UA: 0.2 E.U./dL
pH, UA: 5.5 (ref 5.0–8.0)

## 2019-10-22 NOTE — Assessment & Plan Note (Addendum)
Glucose 150 while in the ED. we will continue Metformin 500 mg twice daily for now and he will come in in about 6 months to recheck A1c.

## 2019-10-22 NOTE — Assessment & Plan Note (Signed)
Stable. Continue current treatment plan with Coreg 3.125 mg twice daily, irbesartan 300 mg daily, and spironolactone 25 mg daily.

## 2019-10-22 NOTE — Patient Instructions (Signed)
It was very nice to see you today!  You probably had a kidney stone. Let me know if your symptoms come back.  I will see you back in September or sooner if needed.   Take care, Dr [name]  Please try these tips to maintain a healthy lifestyle:   Eat at least 3 REAL meals and 1-2 snacks per day.  Aim for no more than 5 hours between eating.  If you eat breakfast, please do so within one hour of getting up.    Each meal should contain half fruits/vegetables, one quarter protein, and one quarter carbs (no bigger than a computer mouse)   Cut down on sweet beverages. This includes juice, soda, and sweet tea.     Drink at least 1 glass of water with each meal and aim for at least 8 glasses per day   Exercise at least 150 minutes every week.

## 2019-10-22 NOTE — Progress Notes (Addendum)
   ROLLY ALBO is a 69 y.o. male who presents today for an office visit.  Assessment/Plan:  New/Acute Problems: Hematuria Symptoms have improved.  No blood on UA today.  Likely kidney stone that has already passed.  Will give urinary strainer and advised him to start strain urine if symptoms return.  Discussed reasons to return to care.  Follow-up as needed.  Chronic Problems Addressed Today: Hypertension associated with diabetes ([place]) Stable. Continue current treatment plan with Coreg 3.125 mg twice daily, irbesartan 300 mg daily, and spironolactone 25 mg daily.  T2DM (type 2 diabetes mellitus) ([facility]) Glucose 150 while in the ED. we will continue Metformin 500 mg twice daily for now and he will come in in about 6 months to recheck A1c.     Subjective:  HPI:  Patient here for follow-up.  2 days ago had sudden onset lower back pain.  Went to the emergency room.  Left before being seen though had some lab work done.  UA showed positive RBCs and hemoglobin.  Over last year or so symptoms have improved.  He thought that he may have seen a small amount of blood in his urine.  He is otherwise doing well.  He still has some orthopedic issues and is seeing orthopedics for this.  Symptoms are overall stable.  He is doing well with his medications.       Objective:  Physical Exam: BP 110/70 (BP Location: Left Arm, Patient Position: Sitting, Cuff Size: Large)   Pulse 67   Temp 98 F (36.7 C) (Temporal)   Ht 5\' 9"  (1.753 m)   Wt 188 lb 6.4 oz (85.5 kg)   SpO2 97%   BMI 27.82 kg/m   Gen: No acute distress, resting comfortably CV: Regular rate and rhythm with no murmurs appreciated Pulm: Normal work of breathing, clear to auscultation bilaterally with no crackles, wheezes, or rhonchi Neuro: Grossly normal, moves all extremities Psych: Normal affect and thought content      Gennaro Lizotte M. [name], MD 10/22/2019 1:44 PM

## 2019-10-23 LAB — URINE CULTURE
MICRO NUMBER:: 10410843
Result:: NO GROWTH
SPECIMEN QUALITY:: ADEQUATE

## 2019-10-25 NOTE — Progress Notes (Signed)
Please inform patient of the following:  Urine culture negative - no signs of UTI. Would like for him to let us know if symptoms return.

## 2019-12-23 ENCOUNTER — Other Ambulatory Visit: Payer: Self-pay | Admitting: Family Medicine

## 2020-01-27 ENCOUNTER — Telehealth: Payer: Self-pay | Admitting: Family Medicine

## 2020-01-27 ENCOUNTER — Ambulatory Visit (INDEPENDENT_AMBULATORY_CARE_PROVIDER_SITE_OTHER): Payer: Medicare Other | Admitting: Orthopedic Surgery

## 2020-01-27 DIAGNOSIS — I25118 Atherosclerotic heart disease of native coronary artery with other forms of angina pectoris: Secondary | ICD-10-CM

## 2020-01-27 DIAGNOSIS — S46011A Strain of muscle(s) and tendon(s) of the rotator cuff of right shoulder, initial encounter: Secondary | ICD-10-CM | POA: Diagnosis not present

## 2020-01-27 NOTE — Progress Notes (Signed)
°  Chronic Care Management   Note  01/27/2020 Name: [name] MRN: [id number] DOB: [birth year]  [name] [name] is a 69 y.o. year old male who is a primary care patient of Vivi Barrack, MD. I reached out to [name] [name] by phone today in response to a referral sent by [name] [name]'s PCP, Vivi Barrack, MD.   [name] [name] was given information about Chronic Care Management services today including:  1. CCM service includes personalized support from designated clinical staff supervised by his physician, including individualized plan of care and coordination with other care providers 2. 24/7 contact phone numbers for assistance for urgent and routine care needs. 3. Service will only be billed when office clinical staff spend 20 minutes or more in a month to coordinate care. 4. Only one practitioner may furnish and bill the service in a calendar month. 5. The patient may stop CCM services at any time (effective at the end of the month) by phone call to the office staff.   Patient agreed to services and verbal consent obtained.   Follow up plan:   [name] [name] Upstream Scheduler

## 2020-01-30 NOTE — Progress Notes (Signed)
Chronic Care Management Pharmacy  Name: [name] [name]  MRN: [id number] DOB: [birth year]  Chief Complaint/ HPI  [name],  69 y.o. , male presents for their Initial CCM visit with the clinical pharmacist via telephone due to COVID-19 Pandemic.  Mentions BP ~96/60 in July so he had started taking irbesartan in evenings. Today home BP is 114/78, pulse 60.   Example diet: cereal for breakfast, sandwich with honey wheat bread for lunch, 3-4 glasses of decaffeinated tea throughout day, 1 glass orange juice per day, spaghetti w/ meat balls for dinner. Some fruits such as orange or apple throughout the day.   PCP : Vivi Barrack, MD Future Appointments  Date Time Provider [place]  03/05/2020  9:20 AM Vivi Barrack, MD [facility]  03/05/2020 10:15 AM [facility] COACH [facility]   Chronic conditions include:  Encounter Diagnoses  Name Primary?   Type 2 diabetes mellitus with hyperglycemia, without long-term current use of insulin ([place]) Yes   Hypertension associated with diabetes ([place])    Hyperlipidemia associated with type 2 diabetes mellitus ([place])     Office Visits:  10/22/2019 (PCP): hematuria, likely kidney stone that had already passed. Glucose 150 while in ED. 22monthrecheck on a1c.eat breakfast within 1 hour of getting up, 8 cups h2o/day.  Patient Active Problem List   Diagnosis Date Noted   Labral tear of long head of biceps tendon, left, sequela    Complete tear of left rotator cuff    Diverticulosis of colon (without mention of hemorrhage) 04/25/2011   External thrombosed hemorrhoids 04/25/2011   Adenomatous colon polyp 03/17/2011   LOW BACK PAIN, CHRONIC 12/13/2007   Hyperlipidemia associated with type 2 diabetes mellitus ([place] 06/08/2007   T2DM (type 2 diabetes mellitus) ([place] 02/27/2007   Hypertension associated with diabetes ([place] 02/27/2007   AMBLYOPIA 12/06/2006   GERD 12/06/2006   Past Surgical History:  Procedure Laterality  Date   BACK SURGERY     CARDIAC CATHETERIZATION  2007; 03/26/2014   COLONOSCOPY  2006   tics , ADENOMATOUS polyps. F/U due 2011   COLONOSCOPY  2012   Tics   CORONARY ANGIOPLASTY WITH STENT PLACEMENT  04/01/2014   "2"   ESOPHAGEAL DILATION  2003   ESOPHAGOGASTRODUODENOSCOPY  2003   ERD, stricture dilated   EXCISIONAL HEMORRHOIDECTOMY  ~ 2008   LEFT HEART CATHETERIZATION WITH CORONARY ANGIOGRAM N/A 03/26/2014   Procedure: LEFT HEART CATHETERIZATION WITH CORONARY ANGIOGRAM;  Surgeon: Peter M JMartinique MD;  [facility];  Service: Cardiovascular;  Laterality: N/A;   PERCUTANEOUS CORONARY STENT INTERVENTION (PCI-S) N/A 04/01/2014   Procedure: PERCUTANEOUS CORONARY STENT INTERVENTION (PCI-S);  Surgeon: Peter M JMartinique MD;  [facility];  Service: Cardiovascular;  Laterality: N/A;   POSTERIOR LAMINECTOMY / DECOMPRESSION LUMBAR SPINE  12/2009   L4 , Dr NSherwood Gambler  SHOULDER ARTHROSCOPY WITH ROTATOR CUFF REPAIR AND OPEN BICEPS TENODESIS Left 02/04/2019   Procedure: LEFT SHOULDER ARTHROSCOPY, BICEPS TENODESIS, MINI OPEN ROTATOR CUFF TEAR REPAIR;  Surgeon: DMeredith Pel MD;  [facility]  Service: Orthopedics;  Laterality: Left;   Social History   Socioeconomic History   Marital status: Single    Spouse name: Not on file   Number of children: 0   Years of education: 16   Highest education level: Not on file  Occupational History   Occupation: Retired   Tobacco Use   Smoking status: Never Smoker   Smokeless tobacco: Never Used  VElectronics engineer  Use   Vaping Use: Never used  Substance and Sexual Activity   Alcohol use: Yes    Alcohol/week: 2.0 standard drinks    Types: 2 Cans of beer per week    Comment: social   Drug use: No   Sexual activity: Not Currently  Other Topics Concern   Not on file  Social History Narrative   Fun: Designer, jewellery.    Social Determinants of Health   Financial Resource Strain:    Difficulty of  Paying Living Expenses:   Food Insecurity:    Worried About Charity fundraiser in the Last Year:    Arboriculturist in the Last Year:   Transportation Needs:    Film/video editor (Medical):    Lack of Transportation (Non-Medical):   Physical Activity:    Days of Exercise per Week:    Minutes of Exercise per Session:   Stress:    Feeling of Stress :   Social Connections:    Frequency of Communication with Friends and Family:    Frequency of Social Gatherings with Friends and Family:    Attends Religious Services:    Active Member of Clubs or Organizations:    Attends Archivist Meetings:    Marital Status:   SDOH (Social Determinants of Health) assessments performed: Yes. Family History  Problem Relation Age of Onset   Heart attack Mother 39   Stroke Mother 1   Prostate cancer Father    Diabetes Father    Heart disease Father 73       CBAG    Diabetes Brother    Heart attack Brother    Breast cancer Maternal Aunt    Heart attack Maternal Grandfather 61   Diabetes Paternal Grandmother    Stroke Paternal Grandfather 20   Heart disease Sister    Allergies  Allergen Reactions   Aspirin Other (See Comments)    Angioedema   Other Hives   Oxycodone     nausea   Contrast Media [Iodinated Diagnostic Agents] Itching and Rash    Developed hives despite pre treatment   Norvasc [Amlodipine Besylate] Other (See Comments)    09/12/2013 gingival hyperplasia , Dr Geralynn Ochs ,DDS   Outpatient Encounter Medications as of 02/10/2020  Medication Sig   atorvastatin (LIPITOR) 80 MG tablet Take 1 tablet by mouth every day at 6 pm (Patient taking differently: Take 80 mg by mouth daily. )   carvedilol (COREG) 3.125 MG tablet TAKE 1 TABLET TWICE DAILY  WITH MEALS   clopidogrel (PLAVIX) 75 MG tablet Take 1 tablet (75 mg total) by mouth daily.   Coenzyme Q10 (CO Q 10 PO) Take 200 mg by mouth daily.    Cyanocobalamin (VITAMIN B 12 PO) Take 1 tablet  by mouth daily.   irbesartan (AVAPRO) 300 MG tablet TAKE 1 TABLET DAILY (Patient taking differently: Take 300 mg by mouth every evening. )   metFORMIN (GLUCOPHAGE) 500 MG tablet Take 1 tablet by mouth  twice a day with a meal   Misc Natural Products (GLUCOSAMINE CHOND COMPLEX/MSM) TABS    Multiple Vitamins-Minerals (MULTIVITAMIN WITH MINERALS) tablet Take 1 tablet by mouth daily.   sildenafil (REVATIO) 20 MG tablet Take 1-5 tablets by mouth daily as needed.   spironolactone (ALDACTONE) 25 MG tablet Take 1 tablet (25 mg total) by mouth daily.   glucose blood (ONE TOUCH ULTRA TEST) test strip Use one strip per test. Test blood sugars 1-4 times daily as instructed. (Patient not taking:  Reported on 02/10/2020)   nitroGLYCERIN (NITROSTAT) 0.4 MG SL tablet Place 1 tablet (0.4 mg total) under the tongue every 5 (five) minutes as needed for chest pain. (Patient not taking: Reported on 02/10/2020)   No facility-administered encounter medications on file as of 02/10/2020.   Patient Care Team    Relationship Specialty Notifications Start End  Vivi Barrack, MD PCP - General Family Medicine  11/10/17   Martinique, Peter M, MD PCP - Cardiology Cardiology Admissions 01/24/19   Calton Dach, MD Consulting Physician Optometry  02/12/18   Meredith Pel, MD Consulting Physician Orthopedic Surgery  03/05/19   [facility] Consulting Physician   03/05/19   Madelin Rear, [facility] Pharmacist Pharmacist  01/27/20    Comment: [phone]   Current Diagnosis/Assessment: Goals Addressed            This Visit's Progress    PharmD Care Plan       CARE PLAN ENTRY (see longitudinal plan of care for additional care plan information)  Current Barriers:   Chronic Disease Management support, education, and care coordination needs related to Hypertension, Hyperlipidemia, and Diabetes   Hypertension BP Readings from Last 3 Encounters:  10/22/19 110/70  10/21/19 (!) 194/89  09/17/19 118/82     Pharmacist Clinical Goal(s): o Over the next 180 days, patient will work with PharmD and providers to maintain BP goal <130/80  Current regimen:  o Metformin 500 mg twice daily   Interventions: o Review switch to metformin XR 1000 mg daily with evening meal   Patient self care activities - Over the next 180 days, patient will: o Check BP once every 1-2 weeks, document, and provide at future appointments o Ensure daily salt intake < 2300 mg/day  Hyperlipidemia Lab Results  Component Value Date/Time   LDLCALC 59 02/21/2019 10:51 AM   LDLCALC 67 08/18/2016 08:14 AM    Pharmacist Clinical Goal(s): o Over the next 180 days, patient will work with PharmD and providers to maintain LDL goal < 100  Current regimen:  o Atorvastatin 80 mg once daily  Interventions: o Continue current management  Patient self care activities - Over the next 180 days, patient will: o Continue current management  Diabetes Lab Results  Component Value Date/Time   HGBA1C 7.2 (A) 08/28/2019 10:22 AM   HGBA1C 7.2 (H) 02/21/2019 11:23 AM   HGBA1C 6.7 (A) 08/08/2018 09:16 AM   HGBA1C 6.7 (H) 01/17/2017 10:59 AM    Pharmacist Clinical Goal(s): o Over the next 180 days, patient will work with PharmD and providers to achieve A1c goal <7%  Current regimen:  o Metformin 500 mg twice daily with meals  Interventions: o Consider switch to metformin XL 1000 mg once daily  Patient self care activities - Over the next 180 days, patient will: o Check blood sugar as directed, document, and provide at future appointments o Contact provider with any episodes of hypoglycemia  Medication management  Pharmacist Clinical Goal(s): o Over the next 180 days, patient will work with PharmD and providers to achieve optimal medication adherence  Current pharmacy: Mail order  Interventions o Comprehensive medication review performed. o Continue current medication management strategy.  Patient self care  activities - Over the next 180 days, patient will: o Focus on medication adherence by reporting any questions or concerns to PharmD and/or provider(s)  Please see past updates related to this goal by clicking on the "Past Updates" button in the selected goal .      Hypertension  BP goal <130/80  BP Readings from Last 3 Encounters:  10/22/19 110/70  10/21/19 (!) 194/89  09/17/19 118/82   Patient checks BP at home weekly. Readings are ranging: 110s/70s. Patient is currently at goal on the following medications:   Carvedilol 3.25 mg twice daily  Irbesartan 300 mg once every night  Spironolactone 25 mg once daily   We discussed diet and exercise extensively.  Plan  Continue current medications and control with diet and exercise.   Diabetes   A1c goal < 7%  Lab Results  Component Value Date/Time   HGBA1C 7.2 (A) 08/28/2019 10:22 AM   HGBA1C 7.2 (H) 02/21/2019 11:23 AM   HGBA1C 6.7 (A) 08/08/2018 09:16 AM   HGBA1C 6.7 (H) 01/17/2017 10:59 AM   MICROALBUR 0.7 01/17/2017 10:59 AM   MICROALBUR 0.8 03/19/2014 08:51 AM    Checking BG: Rarely. Recent FBG readings n/a.  Patient is currently taking:  Metformin 500 mg BID  We discussed: Diet/exercise recommendations. Cost reviewed on metformin xr 1000 mg daily - not covered however metformin xr 500 mg 2 tabs daily is covered.   Plan  Consider switch to metformin xr 500 mg 2 tabs with evening meal. Continue current medications and control with diet and exercise.  Hyperlipidemia   LDL goal < 70  Lipid Panel     Component Value Date/Time   CHOL 119 02/21/2019 1051   CHOL 132 08/18/2016 0814   TRIG 121.0 02/21/2019 1051   TRIG 82 04/11/2006 0804   HDL 35.90 (L) 02/21/2019 1051   HDL 41 08/18/2016 0814   LDLCALC 59 02/21/2019 1051   LDLCALC 67 08/18/2016 0814    Hepatic Function Latest Ref Rng & Units 10/21/2019 02/21/2019 01/31/2018  Total Protein 6.5 - 8.1 g/dL 7.6 6.5 6.6  Albumin 3.5 - 5.0 g/dL 4.5 4.0 4.3   AST 15 - 41 U/L _0 ALT 0 - 44 U/L _1 Alk Phosphatase 38 - 126 U/L 85 108 68  Total Bilirubin 0.3 - 1.2 mg/dL 0.7 0.6 0.8  Bilirubin, Direct 0.00 - 0.40 mg/dL - - -    The ASCVD Risk score Mikey Bussing DC Jr., et al., 2013) failed to calculate for the following reasons:   The valid total cholesterol range is 130 to 320 mg/dL   Patient is currently controlled on the following medications:   Atorvastatin 80 mg once daily at 6 pm   We discussed:  diet and exercise extensively.  Plan  Continue current medications and control with diet and exercise.  Vaccines   Immunization History  Administered Date(s) Administered   Fluad Quad(high Dose 65+) 02/21/2019   Influenza,inj,Quad PF,6+ Mos 03/19/2014   PFIZER SARS-COV-2 Vaccination 08/01/2019, 08/27/2019   Pneumococcal Conjugate-13 01/17/2017   Pneumococcal Polysaccharide-23 01/31/2018   Td 12/18/2008   Zoster 12/24/2012   Reviewed and discussed patient's vaccination history.  Due for Tdap, shingrix.  Plan  Recommended patient receive shingrix, tdap vaccine in pharmacy.   Medication Management Coordination   Receives prescription medications from:  [facility] [address] [place] [address] Phone: [phone] Fax: [phone]  Continue current management.  Plan  Continue current medication management strategy. ___________________________ Future Appointments  Date Time Provider [place]  03/05/2020  9:20 AM Vivi Barrack, MD [facility]  03/05/2020 10:15 AM [facility] HEALTH COACH [facility]  05/05/2020  1:00 PM [facility] CCM PHARMACIST [facility]   Visit follow-up:  [facility] follow-up: 3 month phone visit.  Madelin Rear, Pharm.D., BCGP Clinical Pharmacist [place] [phone]

## 2020-01-31 ENCOUNTER — Encounter: Payer: Self-pay | Admitting: Orthopedic Surgery
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
## 2020-02-10 ENCOUNTER — Other Ambulatory Visit: Payer: Self-pay

## 2020-02-10 ENCOUNTER — Ambulatory Visit: Payer: Medicare Other

## 2020-02-10 DIAGNOSIS — E785 Hyperlipidemia, unspecified: Secondary | ICD-10-CM

## 2020-02-10 DIAGNOSIS — E1165 Type 2 diabetes mellitus with hyperglycemia: Secondary | ICD-10-CM

## 2020-02-10 DIAGNOSIS — E1159 Type 2 diabetes mellitus with other circulatory complications: Secondary | ICD-10-CM

## 2020-02-10 DIAGNOSIS — E1169 Type 2 diabetes mellitus with other specified complication: Secondary | ICD-10-CM

## 2020-02-10 DIAGNOSIS — I152 Hypertension secondary to endocrine disorders: Secondary | ICD-10-CM

## 2020-02-10 NOTE — Patient Instructions (Signed)
Please call me at [phone] (direct line) with any questions - thank you!  - Edyth Gunnels., Clinical Pharmacist  Goals Addressed            This Visit's Progress   . PharmD Care Plan       CARE PLAN ENTRY (see longitudinal plan of care for additional care plan information)  Current Barriers:  . Chronic Disease Management support, education, and care coordination needs related to Hypertension, Hyperlipidemia, and Diabetes   Hypertension BP Readings from Last 3 Encounters:  10/22/19 110/70  10/21/19 (!) 194/89  09/17/19 118/82   . Pharmacist Clinical Goal(s): o Over the next 180 days, patient will work with PharmD and providers to maintain BP goal <130/80 . Current regimen:  o Metformin 500 mg twice daily  . Interventions: o Review switch to metformin XR 1000 mg daily with evening meal  . Patient self care activities - Over the next 180 days, patient will: o Check BP once every 1-2 weeks, document, and provide at future appointments o Ensure daily salt intake < 2300 mg/day  Hyperlipidemia Lab Results  Component Value Date/Time   LDLCALC 59 02/21/2019 10:51 AM   LDLCALC 67 08/18/2016 08:14 AM   . Pharmacist Clinical Goal(s): o Over the next 180 days, patient will work with PharmD and providers to maintain LDL goal < 100 . Current regimen:  o Atorvastatin 80 mg once daily . Interventions: o Continue current management . Patient self care activities - Over the next 180 days, patient will: o Continue current management  Diabetes Lab Results  Component Value Date/Time   HGBA1C 7.2 (A) 08/28/2019 10:22 AM   HGBA1C 7.2 (H) 02/21/2019 11:23 AM   HGBA1C 6.7 (A) 08/08/2018 09:16 AM   HGBA1C 6.7 (H) 01/17/2017 10:59 AM   . Pharmacist Clinical Goal(s): o Over the next 180 days, patient will work with PharmD and providers to achieve A1c goal <7% . Current regimen:  o Metformin 500 mg twice daily with meals . Interventions: o Consider switch to metformin XL 1000 mg once  daily . Patient self care activities - Over the next 180 days, patient will: o Check blood sugar as directed, document, and provide at future appointments o Contact provider with any episodes of hypoglycemia  Medication management . Pharmacist Clinical Goal(s): o Over the next 180 days, patient will work with PharmD and providers to achieve optimal medication adherence . Current pharmacy: Mail order . Interventions o Comprehensive medication review performed. o Continue current medication management strategy. . Patient self care activities - Over the next 180 days, patient will: o Focus on medication adherence by reporting any questions or concerns to PharmD and/or provider(s)  Please see past updates related to this goal by clicking on the "Past Updates" button in the selected goal .      Mr. Cromartie was given information about Chronic Care Management services today including:  1. CCM service includes personalized support from designated clinical staff supervised by his physician, including individualized plan of care and coordination with other care providers 2. 24/7 contact phone numbers for assistance for urgent and routine care needs. 3. Standard insurance, coinsurance, copays and deductibles apply for chronic care management only during months in which we provide at least 20 minutes of these services. Most insurances cover these services at 100%, however patients may be responsible for any copay, coinsurance and/or deductible if applicable. This service may help you avoid the need for more expensive face-to-face services. 4. Only one practitioner may furnish and  bill the service in a calendar month. 5. The patient may stop CCM services at any time (effective at the end of the month) by phone call to the office staff.  Patient agreed to services and verbal consent obtained.   The patient verbalized understanding of instructions provided today and agreed to receive a mailed copy of  patient instruction and/or educational materials. Telephone follow up appointment with pharmacy team member scheduled for: See next appointment with "Care Management Staff" under "What's Next" below.   Madelin Rear, Pharm.D., BCGP Clinical Pharmacist [place] [phone]

## 2020-02-13 NOTE — Progress Notes (Signed)
Could someone send in a new prescription for metformin XL 500 mg two tabs once daily (90 DS) please?  I do not have access to send in new meds or refills at this time. Let me know and I will contact pt to inform him.  Thanks [name] [name]

## 2020-02-18 ENCOUNTER — Other Ambulatory Visit: Payer: Self-pay

## 2020-02-18 ENCOUNTER — Other Ambulatory Visit: Payer: Medicare Other

## 2020-02-18 DIAGNOSIS — Z20822 Contact with and (suspected) exposure to covid-19: Secondary | ICD-10-CM | POA: Diagnosis not present

## 2020-02-18 NOTE — Progress Notes (Signed)
nove

## 2020-02-19 ENCOUNTER — Other Ambulatory Visit: Payer: Self-pay | Admitting: *Deleted

## 2020-02-19 LAB — SARS-COV-2, NAA 2 DAY TAT

## 2020-02-19 LAB — NOVEL CORONAVIRUS, NAA: SARS-CoV-2, NAA: NOT DETECTED

## 2020-02-19 MED ORDER — METFORMIN HCL 500 MG PO TABS: ORAL_TABLET | ORAL | 3 refills | Status: DC

## 2020-02-19 NOTE — Progress Notes (Signed)
Rx send to [facility] mail order, patient notified

## 2020-02-20 ENCOUNTER — Telehealth (INDEPENDENT_AMBULATORY_CARE_PROVIDER_SITE_OTHER): Payer: Medicare Other | Admitting: Family Medicine

## 2020-02-20 ENCOUNTER — Encounter: Payer: Self-pay | Admitting: Family Medicine

## 2020-02-20 VITALS — BP 119/82 | HR 57 | Wt 185.0 lb

## 2020-02-20 DIAGNOSIS — R432 Parageusia: Secondary | ICD-10-CM

## 2020-02-20 DIAGNOSIS — R067 Sneezing: Secondary | ICD-10-CM

## 2020-02-20 DIAGNOSIS — Z20822 Contact with and (suspected) exposure to covid-19: Secondary | ICD-10-CM

## 2020-02-20 DIAGNOSIS — R05 Cough: Secondary | ICD-10-CM

## 2020-02-20 DIAGNOSIS — R059 Cough, unspecified: Secondary | ICD-10-CM

## 2020-02-20 DIAGNOSIS — R0981 Nasal congestion: Secondary | ICD-10-CM

## 2020-02-20 DIAGNOSIS — I25118 Atherosclerotic heart disease of native coronary artery with other forms of angina pectoris: Secondary | ICD-10-CM

## 2020-02-20 MED ORDER — BENZONATATE 100 MG PO CAPS: 100.0000 mg | ORAL_CAPSULE | Freq: Three times a day (TID) | ORAL | 0 refills | Status: DC | PRN

## 2020-02-20 NOTE — Progress Notes (Signed)
Virtual Visit via Video Note  I connected with [name] [name]  on 02/20/20 at 12:40 PM EDT by a video enabled telemedicine application and verified that I am speaking with the correct person using two identifiers.  Location patient: home, [place] Location provider:work or home office Persons participating in the virtual visit: patient, provider  I discussed the limitations of evaluation and management by telemedicine and the availability of in person appointments. The patient expressed understanding and agreed to proceed.   HPI:  Acute telemedicine visit for  -started on 8/14 after going to the beach -symptoms included initially sneezing and sinus congestion, PND, sinus pressure, mild headache laryngitis, then cough developed -on 8/17 had very close exposure at restaurant with someone with [phone] -pt found out about covid exposure on 8/20, lost taste and smell on 8/22 and felt a little more tired than usual -patient had covid19 test 02/18/20 (antigen and PCR - negative) -feels better in terms of the sinus congestion -taking zyrtec, flonase just the last few days -fully vaccinated for COVID19 with pfizer, 2nd dose in March -denies: fevers, SOB, NVD, malaise, CP, severe headaches, wheezing, sinus pain, ear pain, coughing up mucus -reports hx of DM, heart disease, HTN, overweight  ROS: See pertinent positives and negatives per HPI.  Past Medical History:  Diagnosis Date   Adenomatous colon polyp 09/07/2004   Amblyopia    CAD (coronary artery disease)    cardiac CT with a 50% LAD lesion in 2007   Diverticulosis    DM2 (diabetes mellitus, type 2) (HCC)    Elevated LFTs    secondary to labetalol   Esophageal stricture 2003   External hemorrhoids 2012   lanced by Dr Rise Patience   Family history of anesthesia complication    "sister had PONV"   GERD (gastroesophageal reflux disease)    esophageal stricture, pmh   Hiatal hernia 01/04/00   HLD (hyperlipidemia)    HTN (hypertension)     Hypopotassemia     Past Surgical History:  Procedure Laterality Date   BACK SURGERY     CARDIAC CATHETERIZATION  2007; 03/26/2014   COLONOSCOPY  2006   tics , ADENOMATOUS polyps. F/U due 2011   COLONOSCOPY  2012   Tics   CORONARY ANGIOPLASTY WITH STENT PLACEMENT  04/01/2014   "2"   ESOPHAGEAL DILATION  2003   ESOPHAGOGASTRODUODENOSCOPY  2003   ERD, stricture dilated   EXCISIONAL HEMORRHOIDECTOMY  ~ 2008   LEFT HEART CATHETERIZATION WITH CORONARY ANGIOGRAM N/A 03/26/2014   Procedure: LEFT HEART CATHETERIZATION WITH CORONARY ANGIOGRAM;  Surgeon: Peter M Martinique, MD;  Location: [facility] CATH LAB;  Service: Cardiovascular;  Laterality: N/A;   PERCUTANEOUS CORONARY STENT INTERVENTION (PCI-S) N/A 04/01/2014   Procedure: PERCUTANEOUS CORONARY STENT INTERVENTION (PCI-S);  Surgeon: Peter M Martinique, MD;  Location: [facility] CATH LAB;  Service: Cardiovascular;  Laterality: N/A;   POSTERIOR LAMINECTOMY / DECOMPRESSION LUMBAR SPINE  12/2009   L4 , Dr Sherwood Gambler   SHOULDER ARTHROSCOPY WITH ROTATOR CUFF REPAIR AND OPEN BICEPS TENODESIS Left 02/04/2019   Procedure: LEFT SHOULDER ARTHROSCOPY, BICEPS TENODESIS, MINI OPEN ROTATOR CUFF TEAR REPAIR;  Surgeon: Meredith Pel, MD;  Location: [facility];  Service: Orthopedics;  Laterality: Left;    Family History  Problem Relation Age of Onset   Heart attack Mother 74   Stroke Mother 67   Prostate cancer Father    Diabetes Father    Heart disease Father 58       CBAG    Diabetes Brother  Heart attack Brother    Breast cancer Maternal Aunt    Heart attack Maternal Grandfather 61   Diabetes Paternal Grandmother    Stroke Paternal Grandfather 55   Heart disease Sister     SOCIAL HX: see hpi   Current Outpatient Medications:    atorvastatin (LIPITOR) 80 MG tablet, Take 1 tablet by mouth every day at 6 pm (Patient taking differently: Take 80 mg by mouth daily. ), Disp: 90 tablet, Rfl: 2   carvedilol (COREG) 3.125 MG  tablet, TAKE 1 TABLET TWICE DAILY  WITH MEALS, Disp: 180 tablet, Rfl: 0   clopidogrel (PLAVIX) 75 MG tablet, Take 1 tablet (75 mg total) by mouth daily., Disp: 90 tablet, Rfl: 2   Coenzyme Q10 (CO Q 10 PO), Take 200 mg by mouth daily. , Disp: , Rfl:    Cyanocobalamin (VITAMIN B 12 PO), Take 1 tablet by mouth daily., Disp: , Rfl:    glucose blood (ONE TOUCH ULTRA TEST) test strip, Use one strip per test. Test blood sugars 1-4 times daily as instructed., Disp: 25 each, Rfl: 5   irbesartan (AVAPRO) 300 MG tablet, TAKE 1 TABLET DAILY (Patient taking differently: Take 300 mg by mouth every evening. ), Disp: 90 tablet, Rfl: 0   metFORMIN (GLUCOPHAGE) 500 MG tablet, Take 1 tablet by mouth  twice a day with a meal, Disp: 180 tablet, Rfl: 3   Misc Natural Products (GLUCOSAMINE CHOND COMPLEX/MSM) TABS, , Disp: , Rfl:    Multiple Vitamins-Minerals (MULTIVITAMIN WITH MINERALS) tablet, Take 1 tablet by mouth daily., Disp: , Rfl:    nitroGLYCERIN (NITROSTAT) 0.4 MG SL tablet, Place 1 tablet (0.4 mg total) under the tongue every 5 (five) minutes as needed for chest pain., Disp: 100 tablet, Rfl: 1   sildenafil (REVATIO) 20 MG tablet, Take 1-5 tablets by mouth daily as needed., Disp: 100 tablet, Rfl: 0   spironolactone (ALDACTONE) 25 MG tablet, Take 1 tablet (25 mg total) by mouth daily., Disp: 90 tablet, Rfl: 2   benzonatate (TESSALON PERLES) 100 MG capsule, Take 1 capsule (100 mg total) by mouth 3 (three) times daily as needed., Disp: 20 capsule, Rfl: 0  EXAM:  VITALS per patient if applicable: Today's Vitals   02/20/20 1030  BP: 119/82  Pulse: (!) 57  Weight: 185 lb (83.9 kg)   Body mass index is 27.32 kg/m.   GENERAL: alert, oriented, appears well and in no acute distress  HEENT: atraumatic, conjunttiva clear, no obvious abnormalities on inspection of external nose and ears  NECK: normal movements of the head and neck  LUNGS: on inspection no signs of respiratory distress, breathing  rate appears normal, no obvious gross SOB, gasping or wheezing  CV: no obvious cyanosis  MS: moves all visible extremities without noticeable abnormality  PSYCH/NEURO: pleasant and cooperative, no obvious depression or anxiety, speech and thought processing grossly intact  ASSESSMENT AND PLAN:  Discussed the following assessment and plan:  Sinus congestion  Cough  Loss of taste  Sneezing  Close exposure to COVID-19 virus  -we discussed possible serious and likely etiologies, options for evaluation and workup, limitations of telemedicine visit vs in person visit, treatment, treatment risks and precautions. Pt prefers to treat via telemedicine empirically rather then risking or undertaking an in person visit at this moment. Query allergic rhinitis, VURI vs undetected COVID19 vs other. Had neg covid testing and is vaccinated, but had close exposure to case of COVID19. Symptoms now improving other than cough and some allergic rhinitis type symptoms.  Opted to treat cough with tesslaon. Allergy regimen of Allegra and Flonase for 2 weeks. Stay home while sick and for 10 days if possible since worsening of symptoms after close covid exposure - though since neg test and vaccinated - less likely. Advised to seek prompt follow up telemedicine visit or in person care if worsening, new symptoms arise, or if is not improving with treatment.    I discussed the assessment and treatment plan with the patient. The patient was provided an opportunity to ask questions and all were answered. The patient agreed with the plan and demonstrated an understanding of the instructions.   The patient was advised to call back or seek an in-person evaluation if the symptoms worsen or if the condition fails to improve as anticipated.   Lucretia Kern, DO

## 2020-02-25 ENCOUNTER — Telehealth: Payer: Self-pay

## 2020-02-25 NOTE — Telephone Encounter (Signed)
error 

## 2020-02-26 ENCOUNTER — Telehealth (INDEPENDENT_AMBULATORY_CARE_PROVIDER_SITE_OTHER): Payer: Medicare Other | Admitting: Family Medicine

## 2020-02-26 DIAGNOSIS — E1165 Type 2 diabetes mellitus with hyperglycemia: Secondary | ICD-10-CM | POA: Diagnosis not present

## 2020-02-26 DIAGNOSIS — J329 Chronic sinusitis, unspecified: Secondary | ICD-10-CM

## 2020-02-26 MED ORDER — AMOXICILLIN-POT CLAVULANATE 875-125 MG PO TABS: 1.0000 | ORAL_TABLET | Freq: Two times a day (BID) | ORAL | 0 refills | Status: DC

## 2020-02-26 NOTE — Progress Notes (Signed)
   [name] [name] is a 69 y.o. male who presents today for a virtual office visit.  Assessment/Plan:  New/Acute Problems: Sinusitis Given length of symptoms will start augmentin. He has some left over methylprednisolone and will start that as well.  Discussed reasons to return to care. Follow up as needed.  Kidney Stone No current symptoms. Patient does not want further evaluation at this point given it was a one time occurrence. Discussed importance of oral hydration.   Chronic Problems Addressed Today: T2DM (type 2 diabetes mellitus) ([place]) Coming in next week to check A1c. Continue metformin 500mg  twice daily.     Subjective:  HPI:  Symptoms started a few weeks ago. Symptoms include cough, congestion, mucus production. Has tried taking zyrtec, flonase, and OTC cough medications. Has tried taking tessalon with no significiant improvement. Took a covid test which was negative.   He passed a kidney stone a few months ago.        Objective/Observations  Physical Exam: Gen: NAD, resting comfortably Pulm: Normal work of breathing Neuro: Grossly normal, moves all extremities Psych: Normal affect and thought content  Virtual Visit via Video   I connected with [name] [name] on 02/26/20 at  8:20 AM EDT by a video enabled telemedicine application and verified that I am speaking with the correct person using two identifiers. The limitations of evaluation and management by telemedicine and the availability of in person appointments were discussed. The patient expressed understanding and agreed to proceed.   Patient location: Home Provider location: [place] participating in the virtual visit: Myself and Patient     Algis Greenhouse. [name], MD 02/26/2020 8:28 AM

## 2020-02-26 NOTE — Assessment & Plan Note (Signed)
Coming in next week to check A1c. Continue metformin 500mg  twice daily.

## 2020-02-28 ENCOUNTER — Encounter: Payer: Self-pay | Admitting: Family Medicine

## 2020-03-03 ENCOUNTER — Encounter: Payer: Medicare Other | Admitting: Family Medicine

## 2020-03-05 ENCOUNTER — Other Ambulatory Visit: Payer: Self-pay

## 2020-03-05 ENCOUNTER — Ambulatory Visit (INDEPENDENT_AMBULATORY_CARE_PROVIDER_SITE_OTHER): Payer: Medicare Other | Admitting: Family Medicine

## 2020-03-05 ENCOUNTER — Ambulatory Visit: Payer: Medicare Other

## 2020-03-05 VITALS — BP 149/78 | HR 53 | Temp 97.3°F | Ht 69.0 in | Wt 186.0 lb

## 2020-03-05 DIAGNOSIS — Z125 Encounter for screening for malignant neoplasm of prostate: Secondary | ICD-10-CM

## 2020-03-05 DIAGNOSIS — E1165 Type 2 diabetes mellitus with hyperglycemia: Secondary | ICD-10-CM | POA: Diagnosis not present

## 2020-03-05 DIAGNOSIS — E1169 Type 2 diabetes mellitus with other specified complication: Secondary | ICD-10-CM

## 2020-03-05 DIAGNOSIS — N200 Calculus of kidney: Secondary | ICD-10-CM | POA: Diagnosis not present

## 2020-03-05 DIAGNOSIS — N429 Disorder of prostate, unspecified: Secondary | ICD-10-CM | POA: Diagnosis not present

## 2020-03-05 DIAGNOSIS — E785 Hyperlipidemia, unspecified: Secondary | ICD-10-CM

## 2020-03-05 DIAGNOSIS — I152 Hypertension secondary to endocrine disorders: Secondary | ICD-10-CM

## 2020-03-05 DIAGNOSIS — I10 Essential (primary) hypertension: Secondary | ICD-10-CM

## 2020-03-05 DIAGNOSIS — L570 Actinic keratosis: Secondary | ICD-10-CM | POA: Diagnosis not present

## 2020-03-05 DIAGNOSIS — E1159 Type 2 diabetes mellitus with other circulatory complications: Secondary | ICD-10-CM

## 2020-03-05 NOTE — Assessment & Plan Note (Signed)
No current symptoms.  Patient has stone that he passed.  Advised him to hold onto it in case symptoms return and we referred him to urology for stone analysis.

## 2020-03-05 NOTE — Assessment & Plan Note (Signed)
Check lipid panel.  Continue Lipitor 80 mg daily. 

## 2020-03-05 NOTE — Assessment & Plan Note (Signed)
Check A1c.  Continue Metformin 500 mg twice daily.  He will check with insurance about coverage for extended release Metformin.

## 2020-03-05 NOTE — Assessment & Plan Note (Signed)
At goal per JNC 8.  Continue current medications with Coreg 3.125 mg twice daily, irbesartan 300 mg daily, and spironolactone 25 mg daily.

## 2020-03-05 NOTE — Patient Instructions (Addendum)
It was very nice to see you today!  We froze the actinic keratosis on your left forearm today.  We will check blood work today.  Please check with your insurance about coverage for fortamet orGlumetza.  I will see you back in 6 months depending on the results of your blood work.   Take care, Dr [name]  Please try these tips to maintain a healthy lifestyle:     Eat at least 3 REAL meals and 1-2 snacks per day.  Aim for no more than 5 hours between eating.  If you eat breakfast, please do so within one hour of getting up.    Each meal should contain half fruits/vegetables, one quarter protein, and one quarter carbs (no bigger than a computer mouse)   Cut down on sweet beverages. This includes juice, soda, and sweet tea.     Drink at least 1 glass of water with each meal and aim for at least 8 glasses per day   Exercise at least 150 minutes every week.

## 2020-03-05 NOTE — Progress Notes (Signed)
   [name] [name] is a 68 y.o. male who presents today for an office visit.  Assessment/Plan:  New/Acute Problems: Actinic Keratosis Cryotherapy applied today.  See below procedure note.  He tolerated well.  Sinusitis Seems to be improving modestly.  Still has another day of antibiotics.  Continue current treatment plan for the next couple of weeks.  He will let me know if not improving.  Chronic Problems Addressed Today: Hypertension associated with diabetes ([place]) At goal per JNC 8.  Continue current medications with Coreg 3.125 mg twice daily, irbesartan 300 mg daily, and spironolactone 25 mg daily.  Hyperlipidemia associated with type 2 diabetes mellitus (HCC) Check lipid panel.  Continue Lipitor 80 mg daily.  T2DM (type 2 diabetes mellitus) (HCC) Check A1c.  Continue Metformin 500 mg twice daily.  He will check with insurance about coverage for extended release Metformin.  Nephrolithiasis No current symptoms.  Patient has stone that he passed.  Advised him to hold onto it in case symptoms return and we referred him to urology for stone analysis.     Subjective:  HPI:  See A/p.         Objective:  Physical Exam: BP (!) 149/78   Pulse (!) 53   Temp (!) 97.3 F (36.3 C) (Temporal)   Ht 5\' 9"  (1.753 m)   Wt 186 lb (84.4 kg)   SpO2 98%   BMI 27.47 kg/m   Gen: No acute distress, resting comfortably CV: Regular rate and rhythm with no murmurs appreciated Pulm: Normal work of breathing, clear to auscultation bilaterally with no crackles, wheezes, or rhonchi Skin: AK on left forearm.  Neuro: Grossly normal, moves all extremities Psych: Normal affect and thought content  Cryotherapy Procedure Note  Pre-operative Diagnosis: Actinic keratosis  Locations: Left Forearm  Indications: Therapeutic  Procedure Details  Patient informed of risks (permanent scarring, infection, light or dark discoloration, bleeding, infection, weakness, numbness and recurrence of the  lesion) and benefits of the procedure and verbal informed consent obtained.  The area was treated with liquid nitrogen therapy, frozen until ice ball extended 3 mm beyond lesion, allowed to thaw, and treated again. The patient tolerated procedure well.  The patient was instructed on post-op care, warned that there may be blister formation, redness and pain. Recommend OTC analgesia as needed for pain.  Condition: Stable  Complications: none.    Time Spent: 43 minutes of total time was spent on the date of the encounter performing the following actions: chart review prior to seeing the patient, obtaining history, performing a medically necessary exam, counseling on the treatment plan, placing orders, and documenting in our EHR. This does not include time spent performing the above procedure.       Algis Greenhouse. [name], MD 03/05/2020 10:03 AM

## 2020-03-06 LAB — COMPREHENSIVE METABOLIC PANEL
AG Ratio: 1.8 (calc) (ref 1.0–2.5)
ALT: 22 U/L (ref 9–46)
AST: 16 U/L (ref 10–35)
Albumin: 4 g/dL (ref 3.6–5.1)
Alkaline phosphatase (APISO): 79 U/L (ref 35–144)
BUN: 14 mg/dL (ref 7–25)
CO2: 28 mmol/L (ref 20–32)
Calcium: 9.5 mg/dL (ref 8.6–10.3)
Chloride: 105 mmol/L (ref 98–110)
Creat: 0.96 mg/dL (ref 0.70–1.18)
Globulin: 2.2 g/dL (calc) (ref 1.9–3.7)
Glucose, Bld: 146 mg/dL — ABNORMAL HIGH (ref 65–99)
Potassium: 4.9 mmol/L (ref 3.5–5.3)
Sodium: 139 mmol/L (ref 135–146)
Total Bilirubin: 0.8 mg/dL (ref 0.2–1.2)
Total Protein: 6.2 g/dL (ref 6.1–8.1)

## 2020-03-06 LAB — CBC
HCT: 42.1 % (ref 38.5–50.0)
Hemoglobin: 14.2 g/dL (ref 13.2–17.1)
MCH: 33.6 pg — ABNORMAL HIGH (ref 27.0–33.0)
MCHC: 33.7 g/dL (ref 32.0–36.0)
MCV: 99.8 fL (ref 80.0–100.0)
MPV: 10.1 fL (ref 7.5–12.5)
Platelets: 243 10*3/uL (ref 140–400)
RBC: 4.22 10*6/uL (ref 4.20–5.80)
RDW: 11.9 % (ref 11.0–15.0)
WBC: 7.4 10*3/uL (ref 3.8–10.8)

## 2020-03-06 LAB — HEMOGLOBIN A1C
Hgb A1c MFr Bld: 7.5 % of total Hgb — ABNORMAL HIGH (ref <5.7)
Mean Plasma Glucose: 169 (calc)
eAG (mmol/L): 9.3 (calc)

## 2020-03-06 LAB — PSA: PSA: 0.9 ng/mL (ref <=4.0)

## 2020-03-06 LAB — LIPID PANEL
Cholesterol: 122 mg/dL (ref <200)
HDL: 34 mg/dL — ABNORMAL LOW (ref >=40)
LDL Cholesterol (Calc): 68 mg/dL (calc)
Non-HDL Cholesterol (Calc): 88 mg/dL (calc) (ref <130)
Total CHOL/HDL Ratio: 3.6 (calc) (ref <5.0)
Triglycerides: 117 mg/dL (ref <150)

## 2020-03-06 LAB — TSH: TSH: 0.63 mIU/L (ref 0.40–4.50)

## 2020-03-06 NOTE — Progress Notes (Signed)
Please inform patient of the following:  Labs are all STABLE. Would like for him to keep up the good work and we can recheck in 6 months.  [name] [name]. [name], MD 03/06/2020 12:59 PM

## 2020-03-12 ENCOUNTER — Other Ambulatory Visit: Payer: Self-pay

## 2020-03-12 ENCOUNTER — Ambulatory Visit (INDEPENDENT_AMBULATORY_CARE_PROVIDER_SITE_OTHER): Payer: Medicare Other

## 2020-03-12 DIAGNOSIS — Z0000 Encounter for general adult medical examination without abnormal findings: Secondary | ICD-10-CM

## 2020-03-12 NOTE — Patient Instructions (Addendum)
[name] , Thank you for taking time to come for your Medicare Wellness Visit. I appreciate your ongoing commitment to your health goals. Please review the following plan we discussed and let me know if I can assist you in the future.   Screening recommendations/referrals: Colonoscopy: Done 04/25/11 Recommended yearly ophthalmology/optometry visit for glaucoma screening and checkup Recommended yearly dental visit for hygiene and checkup  Vaccinations: Influenza vaccine: Up to date Pneumococcal vaccine: Up to date Tdap vaccine: Due and discussed Shingles vaccine: Zoster 12/24/12   Covid-19: Completed 2/4 & 08/27/19  Advanced directives: Copy in chart  Conditions/risks identified: Lose weight 180lbs and get A1C down below 7.5  Next appointment: Follow up in one year for your annual wellness visit.   Preventive Care 70 Years and Older, Male Preventive care refers to lifestyle choices and visits with your health care provider that can promote health and wellness. What does preventive care include?  A yearly physical exam. This is also called an annual well check.  Dental exams once or twice a year.  Routine eye exams. Ask your health care provider how often you should have your eyes checked.  Personal lifestyle choices, including:  Daily care of your teeth and gums.  Regular physical activity.  Eating a healthy diet.  Avoiding tobacco and drug use.  Limiting alcohol use.  Practicing safe sex.  Taking low doses of aspirin every day.  Taking vitamin and mineral supplements as recommended by your health care provider. What happens during an annual well check? The services and screenings done by your health care provider during your annual well check will depend on your age, overall health, lifestyle risk factors, and family history of disease. Counseling  Your health care provider may ask you questions about your:  Alcohol use.  Tobacco use.  Drug use.  Emotional  well-being.  Home and relationship well-being.  Sexual activity.  Eating habits.  History of falls.  Memory and ability to understand (cognition).  Work and work Statistician. Screening  You may have the following tests or measurements:  Height, weight, and BMI.  Blood pressure.  Lipid and cholesterol levels. These may be checked every 5 years, or more frequently if you are over 33 years old.  Skin check.  Lung cancer screening. You may have this screening every year starting at age 56 if you have a 30-pack-year history of smoking and currently smoke or have quit within the past 15 years.  Fecal occult blood test (FOBT) of the stool. You may have this test every year starting at age 93.  Flexible sigmoidoscopy or colonoscopy. You may have a sigmoidoscopy every 5 years or a colonoscopy every 10 years starting at age 54.  Prostate cancer screening. Recommendations will vary depending on your family history and other risks.  Hepatitis C blood test.  Hepatitis B blood test.  Sexually transmitted disease (STD) testing.  Diabetes screening. This is done by checking your blood sugar (glucose) after you have not eaten for a while (fasting). You may have this done every 1-3 years.  Abdominal aortic aneurysm (AAA) screening. You may need this if you are a current or former smoker.  Osteoporosis. You may be screened starting at age 34 if you are at high risk. Talk with your health care provider about your test results, treatment options, and if necessary, the need for more tests. Vaccines  Your health care provider may recommend certain vaccines, such as:  Influenza vaccine. This is recommended every year.  Tetanus, diphtheria,  and acellular pertussis (Tdap, Td) vaccine. You may need a Td booster every 10 years.  Zoster vaccine. You may need this after age 50.  Pneumococcal 13-valent conjugate (PCV13) vaccine. One dose is recommended after age 14.  Pneumococcal  polysaccharide (PPSV23) vaccine. One dose is recommended after age 1. Talk to your health care provider about which screenings and vaccines you need and how often you need them. This information is not intended to replace advice given to you by your health care provider. Make sure you discuss any questions you have with your health care provider. Document Released: 07/10/2015 Document Revised: 03/02/2016 Document Reviewed: 04/14/2015 Elsevier Interactive Patient Education  2017 [place] Prevention in the Home Falls can cause injuries. They can happen to people of all ages. There are many things you can do to make your home safe and to help prevent falls. What can I do on the outside of my home?  Regularly fix the edges of walkways and driveways and fix any cracks.  Remove anything that might make you trip as you walk through a door, such as a raised step or threshold.  Trim any bushes or trees on the path to your home.  Use bright outdoor lighting.  Clear any walking paths of anything that might make someone trip, such as rocks or tools.  Regularly check to see if handrails are loose or broken. Make sure that both sides of any steps have handrails.  Any raised decks and porches should have guardrails on the edges.  Have any leaves, snow, or ice cleared regularly.  Use sand or salt on walking paths during winter.  Clean up any spills in your garage right away. This includes oil or grease spills. What can I do in the bathroom?  Use night lights.  Install grab bars by the toilet and in the tub and shower. Do not use towel bars as grab bars.  Use non-skid mats or decals in the tub or shower.  If you need to sit down in the shower, use a plastic, non-slip stool.  Keep the floor dry. Clean up any water that spills on the floor as soon as it happens.  Remove soap buildup in the tub or shower regularly.  Attach bath mats securely with double-sided non-slip rug  tape.  Do not have throw rugs and other things on the floor that can make you trip. What can I do in the bedroom?  Use night lights.  Make sure that you have a light by your bed that is easy to reach.  Do not use any sheets or blankets that are too big for your bed. They should not hang down onto the floor.  Have a firm chair that has side arms. You can use this for support while you get dressed.  Do not have throw rugs and other things on the floor that can make you trip. What can I do in the kitchen?  Clean up any spills right away.  Avoid walking on wet floors.  Keep items that you use a lot in easy-to-reach places.  If you need to reach something above you, use a strong step stool that has a grab bar.  Keep electrical cords out of the way.  Do not use floor polish or wax that makes floors slippery. If you must use wax, use non-skid floor wax.  Do not have throw rugs and other things on the floor that can make you trip. What can I do with my  stairs?  Do not leave any items on the stairs.  Make sure that there are handrails on both sides of the stairs and use them. Fix handrails that are broken or loose. Make sure that handrails are as long as the stairways.  Check any carpeting to make sure that it is firmly attached to the stairs. Fix any carpet that is loose or worn.  Avoid having throw rugs at the top or bottom of the stairs. If you do have throw rugs, attach them to the floor with carpet tape.  Make sure that you have a light switch at the top of the stairs and the bottom of the stairs. If you do not have them, ask someone to add them for you. What else can I do to help prevent falls?  Wear shoes that:  Do not have high heels.  Have rubber bottoms.  Are comfortable and fit you well.  Are closed at the toe. Do not wear sandals.  If you use a stepladder:  Make sure that it is fully opened. Do not climb a closed stepladder.  Make sure that both sides of the  stepladder are locked into place.  Ask someone to hold it for you, if possible.  Clearly mark and make sure that you can see:  Any grab bars or handrails.  First and last steps.  Where the edge of each step is.  Use tools that help you move around (mobility aids) if they are needed. These include:  Canes.  Walkers.  Scooters.  Crutches.  Turn on the lights when you go into a dark area. Replace any light bulbs as soon as they burn out.  Set up your furniture so you have a clear path. Avoid moving your furniture around.  If any of your floors are uneven, fix them.  If there are any pets around you, be aware of where they are.  Review your medicines with your doctor. Some medicines can make you feel dizzy. This can increase your chance of falling. Ask your doctor what other things that you can do to help prevent falls. This information is not intended to replace advice given to you by your health care provider. Make sure you discuss any questions you have with your health care provider. Document Released: 04/09/2009 Document Revised: 11/19/2015 Document Reviewed: 07/18/2014 Elsevier Interactive Patient Education  2017 [employer].

## 2020-03-12 NOTE — Progress Notes (Signed)
Virtual Visit via Telephone Note  I connected with  [name] [name] on 03/12/20 at  9:30 AM EDT by telephone and verified that I am speaking with the correct person using two identifiers.  Medicare Annual Wellness visit completed telephonically due to Covid-19 pandemic.   Persons participating in this call: This Health Coach and this patient.   Location: Patient: Home Provider: Office   I discussed the limitations, risks, security and privacy concerns of performing an evaluation and management service by telephone and the availability of in person appointments. The patient expressed understanding and agreed to proceed.  Unable to perform video visit due to video visit attempted and failed and/or patient does not have video capability.   Some vital signs may be absent or patient reported.   Willette Brace, LPN    Subjective:   [name] [name] is a 69 y.o. male who presents for Medicare Annual/Subsequent preventive examination.  Review of Systems     Cardiac Risk Factors include: hypertension;dyslipidemia;male gender;diabetes mellitus     Objective:    There were no vitals filed for this visit. There is no height or weight on file to calculate BMI.  Advanced Directives 03/12/2020 10/21/2019 03/05/2019 02/04/2019 01/25/2019 01/31/2018 04/01/2014  Does Patient Have a Medical Advance Directive? Yes No Yes Yes Yes Yes -  Type of Paramedic of [place];Living will - Living will - Living will;Healthcare Power of [place];Living will -  Does patient want to make changes to medical advance directive? - - No - Patient declined No - Patient declined No - Patient declined No - Patient declined -  Copy of Ila in Chart? Yes - validated most recent copy scanned in chart (See row information) - - - - No - copy requested No - copy requested  Would patient like information on creating a medical advance directive? - No - Patient  declined - No - Patient declined - - -    Current Medications (verified) Outpatient Encounter Medications as of 03/12/2020  Medication Sig  . atorvastatin (LIPITOR) 80 MG tablet Take 1 tablet by mouth every day at 6 pm (Patient taking differently: Take 80 mg by mouth daily. )  . benzonatate (TESSALON PERLES) 100 MG capsule Take 1 capsule (100 mg total) by mouth 3 (three) times daily as needed.  . carvedilol (COREG) 3.125 MG tablet TAKE 1 TABLET TWICE DAILY  WITH MEALS  . clopidogrel (PLAVIX) 75 MG tablet Take 1 tablet (75 mg total) by mouth daily.  . Coenzyme Q10 (CO Q 10 PO) Take 200 mg by mouth daily.   . Cyanocobalamin (VITAMIN B 12 PO) Take 1 tablet by mouth daily.  Marland Kitchen glucose blood (ONE TOUCH ULTRA TEST) test strip Use one strip per test. Test blood sugars 1-4 times daily as instructed.  . irbesartan (AVAPRO) 300 MG tablet TAKE 1 TABLET DAILY (Patient taking differently: Take 300 mg by mouth every evening. )  . metFORMIN (GLUCOPHAGE) 500 MG tablet Take by mouth 2 (two) times daily with a meal.  . Misc Natural Products (GLUCOSAMINE CHOND COMPLEX/MSM) TABS   . Multiple Vitamins-Minerals (MULTIVITAMIN WITH MINERALS) tablet Take 1 tablet by mouth daily.  . nitroGLYCERIN (NITROSTAT) 0.4 MG SL tablet Place 1 tablet (0.4 mg total) under the tongue every 5 (five) minutes as needed for chest pain.  . sildenafil (REVATIO) 20 MG tablet Take 1-5 tablets by mouth daily as needed.  Marland Kitchen spironolactone (ALDACTONE) 25 MG tablet Take 1 tablet (25  mg total) by mouth daily.  . [DISCONTINUED] amoxicillin-clavulanate (AUGMENTIN) 875-125 MG tablet Take 1 tablet by mouth 2 (two) times daily. (Patient not taking: Reported on 03/12/2020)  . [DISCONTINUED] metFORMIN (GLUCOPHAGE) 500 MG tablet Take 1 tablet by mouth  twice a day with a meal   No facility-administered encounter medications on file as of 03/12/2020.    Allergies (verified) Aspirin, Other, Oxycodone, Contrast media [iodinated diagnostic agents], and  Norvasc [amlodipine besylate]   History: Past Medical History:  Diagnosis Date  . Adenomatous colon polyp 09/07/2004  . Amblyopia   . CAD (coronary artery disease)    cardiac CT with a 50% LAD lesion in 2007  . Diverticulosis   . DM2 (diabetes mellitus, type 2) ([place])   . Elevated LFTs    secondary to labetalol  . Esophageal stricture 2003  . External hemorrhoids 2012   lanced by Dr Rise Patience  . Family history of anesthesia complication    "sister had PONV"  . GERD (gastroesophageal reflux disease)    esophageal stricture, pmh  . Hiatal hernia 01/04/00  . HLD (hyperlipidemia)   . HTN (hypertension)   . Hypopotassemia    Past Surgical History:  Procedure Laterality Date  . BACK SURGERY    . CARDIAC CATHETERIZATION  2007; 03/26/2014  . COLONOSCOPY  2006   tics , ADENOMATOUS polyps. F/U due 2011  . COLONOSCOPY  2012   Tics  . CORONARY ANGIOPLASTY WITH STENT PLACEMENT  04/01/2014   "2"  . ESOPHAGEAL DILATION  2003  . ESOPHAGOGASTRODUODENOSCOPY  2003   ERD, stricture dilated  . EXCISIONAL HEMORRHOIDECTOMY  ~ 2008  . LEFT HEART CATHETERIZATION WITH CORONARY ANGIOGRAM N/A 03/26/2014   Procedure: LEFT HEART CATHETERIZATION WITH CORONARY ANGIOGRAM;  Surgeon: Peter M Martinique, MD;  Location: [facility] CATH LAB;  Service: Cardiovascular;  Laterality: N/A;  . PERCUTANEOUS CORONARY STENT INTERVENTION (PCI-S) N/A 04/01/2014   Procedure: PERCUTANEOUS CORONARY STENT INTERVENTION (PCI-S);  Surgeon: Peter M Martinique, MD;  Location: [facility] CATH LAB;  Service: Cardiovascular;  Laterality: N/A;  . POSTERIOR LAMINECTOMY / DECOMPRESSION LUMBAR SPINE  12/2009   L4 , Dr Sherwood Gambler  . SHOULDER ARTHROSCOPY WITH ROTATOR CUFF REPAIR AND OPEN BICEPS TENODESIS Left 02/04/2019   Procedure: LEFT SHOULDER ARTHROSCOPY, BICEPS TENODESIS, MINI OPEN ROTATOR CUFF TEAR REPAIR;  Surgeon: Meredith Pel, MD;  Location: [facility];  Service: Orthopedics;  Laterality: Left;   Family History  Problem Relation Age of  Onset  . Heart attack Mother 84  . Stroke Mother 64  . Prostate cancer Father   . Diabetes Father   . Heart disease Father 89       CBAG   . Diabetes Brother   . Heart attack Brother   . Breast cancer Maternal Aunt   . Heart attack Maternal Grandfather 61  . Diabetes Paternal Grandmother   . Stroke Paternal Grandfather 59  . Heart disease Sister    Social History   Socioeconomic History  . Marital status: Single    Spouse name: Not on file  . Number of children: 0  . Years of education: 40  . Highest education level: Not on file  Occupational History  . Occupation: Retired   Tobacco Use  . Smoking status: Never Smoker  . Smokeless tobacco: Never Used  Vaping Use  . Vaping Use: Never used  Substance and Sexual Activity  . Alcohol use: Yes    Alcohol/week: 2.0 standard drinks    Types: 2 Cans of beer per week  Comment: social  . Drug use: No  . Sexual activity: Not Currently  Other Topics Concern  . Not on file  Social History Narrative   Fun: Designer, jewellery.    Social Determinants of Health   Financial Resource Strain: Low Risk   . Difficulty of Paying Living Expenses: Not hard at all  Food Insecurity: No Food Insecurity  . Worried About Charity fundraiser in the Last Year: Never true  . Ran Out of Food in the Last Year: Never true  Transportation Needs: No Transportation Needs  . Lack of Transportation (Medical): No  . Lack of Transportation (Non-Medical): No  Physical Activity: Inactive  . Days of Exercise per Week: 0 days  . Minutes of Exercise per Session: 0 min  Stress: No Stress Concern Present  . Feeling of Stress : Not at all  Social Connections: Socially Isolated  . Frequency of Communication with Friends and Family: Three times a week  . Frequency of Social Gatherings with Friends and Family: Three times a week  . Attends Religious Services: Never  . Active Member of Clubs or Organizations: No  . Attends Archivist Meetings: Never    . Marital Status: Never married    Tobacco Counseling Counseling given: Not Answered   Clinical Intake:  Pre-visit preparation completed: Yes  Pain : No/denies pain     BMI - recorded: 27.47 Nutritional Status: BMI 25 -29 Overweight Nutritional Risks: None Diabetes: Yes CBG done?: No CBG resulted in Enter/ Edit results?: No Did pt. bring in CBG monitor from home?: No     Diabetic?yes  Interpreter Needed?: No  Information entered by :: Charlott Rakes, LPN   Activities of Daily Living In your present state of health, do you have any difficulty performing the following activities: 03/12/2020  Hearing? N  Vision? N  Difficulty concentrating or making decisions? N  Walking or climbing stairs? N  Dressing or bathing? N  Doing errands, shopping? N  Preparing Food and eating ? N  Using the Toilet? N  In the past six months, have you accidently leaked urine? N  Do you have problems with loss of bowel control? N  Managing your Medications? N  Managing your Finances? N  Housekeeping or managing your Housekeeping? N  Some recent data might be hidden    Patient Care Team: Vivi Barrack, MD as PCP - General (Family Medicine) Martinique, Peter M, MD as PCP - Cardiology (Cardiology) Shirley Muscat Loreen Freud, MD as Consulting Physician (Optometry) Marlou Sa, Tonna Corner, MD as Consulting Physician (Orthopedic Surgery) [facility] as Consulting Physician Madelin Rear, [facility] as Pharmacist (Pharmacist)  Indicate any recent Medical Services you may have received from other than [facility] providers in the past year (date may be approximate).     Assessment:   This is a routine wellness examination for Kayde.  Hearing/Vision screen  Hearing Screening   125Hz  250Hz  500Hz  1000Hz  2000Hz  3000Hz  4000Hz  6000Hz  8000Hz   Right ear:           Left ear:           Comments: Pt denies any difficulty hearing   Vision Screening Comments: Dr Isa Rankin annually for eye  exams  Dietary issues and exercise activities discussed: Current Exercise Habits: Home exercise routine, Type of exercise: walking (ride bike and play golf), Time (Minutes): > 60 (when mowing yards), Frequency (Times/Week): 4, Weekly Exercise (Minutes/Week): 0  Goals    . DIET - INCREASE WATER INTAKE  Drinks 2 bottles of water a day but wants to increase to more    . Patient Stated     Lose weight to 180lbs and reduced a1c     . PharmD Care Plan     CARE PLAN ENTRY (see longitudinal plan of care for additional care plan information)  Current Barriers:  . Chronic Disease Management support, education, and care coordination needs related to Hypertension, Hyperlipidemia, and Diabetes   Hypertension BP Readings from Last 3 Encounters:  10/22/19 110/70  10/21/19 (!) 194/89  09/17/19 118/82   . Pharmacist Clinical Goal(s): o Over the next 180 days, patient will work with PharmD and providers to maintain BP goal <130/80 . Current regimen:  o Metformin 500 mg twice daily  . Interventions: o Review switch to metformin XR 1000 mg daily with evening meal  . Patient self care activities - Over the next 180 days, patient will: o Check BP once every 1-2 weeks, document, and provide at future appointments o Ensure daily salt intake < 2300 mg/day  Hyperlipidemia Lab Results  Component Value Date/Time   LDLCALC 59 02/21/2019 10:51 AM   LDLCALC 67 08/18/2016 08:14 AM   . Pharmacist Clinical Goal(s): o Over the next 180 days, patient will work with PharmD and providers to maintain LDL goal < 100 . Current regimen:  o Atorvastatin 80 mg once daily . Interventions: o Continue current management . Patient self care activities - Over the next 180 days, patient will: o Continue current management  Diabetes Lab Results  Component Value Date/Time   HGBA1C 7.2 (A) 08/28/2019 10:22 AM   HGBA1C 7.2 (H) 02/21/2019 11:23 AM   HGBA1C 6.7 (A) 08/08/2018 09:16 AM   HGBA1C 6.7 (H) 01/17/2017  10:59 AM   . Pharmacist Clinical Goal(s): o Over the next 180 days, patient will work with PharmD and providers to achieve A1c goal <7% . Current regimen:  o Metformin 500 mg twice daily with meals . Interventions: o Consider switch to metformin XL 1000 mg once daily . Patient self care activities - Over the next 180 days, patient will: o Check blood sugar as directed, document, and provide at future appointments o Contact provider with any episodes of hypoglycemia  Medication management . Pharmacist Clinical Goal(s): o Over the next 180 days, patient will work with PharmD and providers to achieve optimal medication adherence . Current pharmacy: Mail order . Interventions o Comprehensive medication review performed. o Continue current medication management strategy. . Patient self care activities - Over the next 180 days, patient will: o Focus on medication adherence by reporting any questions or concerns to PharmD and/or provider(s)  Please see past updates related to this goal by clicking on the "Past Updates" button in the selected goal .    . Weight (lb) < 200 lb (90.7 kg)     Would like to lose 10lbs in the next year      Depression Screen PHQ 2/9 Scores 03/12/2020 03/05/2019 02/21/2019 08/08/2018 01/31/2018 01/17/2017 08/10/2015  PHQ - 2 Score 0 0 0 0 0 0 0    Fall Risk Fall Risk  03/12/2020 03/05/2019 02/21/2019 01/31/2018 01/17/2017  Falls in the past year? 1 0 0 No No  Number falls in past yr: 1 0 - - -  Injury with Fall? 1 0 - - -  Comment feel off bike - - - -  Risk for fall due to : History of fall(s);Impaired vision;Impaired balance/gait;Impaired mobility - - - -  Risk for fall due  to: Comment not like i use to be - - - -  Follow up Falls prevention discussed Education provided;Falls evaluation completed - - -    Any stairs in or around the home? Yes  If so, are there any without handrails? No  Home free of loose throw rugs in walkways, pet beds, electrical cords, etc? Yes   Adequate lighting in your home to reduce risk of falls? Yes   ASSISTIVE DEVICES UTILIZED TO PREVENT FALLS:  Life alert? No  Use of a cane, walker or w/c? No  Grab bars in the bathroom? No  Shower chair or bench in shower? No  Elevated toilet seat or a handicapped toilet? Yes   TIMED UP AND GO:  Was the test performed? No .    Cognitive Function:     6CIT Screen 03/12/2020 01/31/2018  What Year? 0 points 0 points  What month? 0 points 0 points  What time? - 0 points  Count back from 20 0 points 0 points  Months in reverse 0 points 0 points  Repeat phrase 0 points -    Immunizations Immunization History  Administered Date(s) Administered  . Fluad Quad(high Dose 65+) 02/21/2019  . Influenza,inj,Quad PF,6+ Mos 03/19/2014  . PFIZER SARS-COV-2 Vaccination 08/01/2019, 08/27/2019  . Pneumococcal Conjugate-13 01/17/2017  . Pneumococcal Polysaccharide-23 01/31/2018  . Td 12/18/2008  . Zoster 12/24/2012    TDAP status: Due, Education has been provided regarding the importance of this vaccine. Advised may receive this vaccine at local pharmacy or Health Dept. Aware to provide a copy of the vaccination record if obtained from local pharmacy or Health Dept. Verbalized acceptance and understanding. Flu Vaccine status: Up to date Pneumococcal vaccine status: Up to date Covid-19 vaccine status: Completed vaccines  Qualifies for Shingles Vaccine? Yes   Zostavax completed Yes   Shingrix Completed?: No.    Education has been provided regarding the importance of this vaccine. Patient has been advised to call insurance company to determine out of pocket expense if they have not yet received this vaccine. Advised may also receive vaccine at local pharmacy or Health Dept. Verbalized acceptance and understanding.  Screening Tests Health Maintenance  Topic Date Due  . OPHTHALMOLOGY EXAM  10/10/2018  . FOOT EXAM  02/21/2020  . INFLUENZA VACCINE  09/24/2020 (Originally 01/26/2020)  .  TETANUS/TDAP  03/05/2021 (Originally 12/19/2018)  . COLONOSCOPY  03/27/2021 (Originally 04/24/2016)  . HEMOGLOBIN A1C  09/02/2020  . COVID-19 Vaccine  Completed  . Hepatitis C Screening  Completed  . PNA vac Low Risk Adult  Completed    Health Maintenance  Health Maintenance Due  Topic Date Due  . OPHTHALMOLOGY EXAM  10/10/2018  . FOOT EXAM  02/21/2020    Colorectal cancer screening: Completed 04/25/11. Repeat every 5 years    Additional Screening:  Hepatitis C Screening: Completed 01/31/18  Vision Screening: Recommended annual ophthalmology exams for early detection of glaucoma and other disorders of the eye. Is the patient up to date with their annual eye exam?  Yes  Who is the provider or what is the name of the office in which the patient attends annual eye exams? Dr Isa Rankin   Dental Screening: Recommended annual dental exams for proper oral hygiene  Community Resource Referral / Chronic Care Management: CRR required this visit?  No   CCM required this visit?  No      Plan:     I have personally reviewed and noted the following in the patient's chart:   . Medical  and social history . Use of alcohol, tobacco or illicit drugs  . Current medications and supplements . Functional ability and status . Nutritional status . Physical activity . Advanced directives . List of other physicians . Hospitalizations, surgeries, and ER visits in previous 12 months . Vitals . Screenings to include cognitive, depression, and falls . Referrals and appointments  In addition, I have reviewed and discussed with patient certain preventive protocols, quality metrics, and best practice recommendations. A written personalized care plan for preventive services as well as general preventive health recommendations were provided to patient.     Willette Brace, LPN   [id number]   Nurse Notes: None

## 2020-03-22 ENCOUNTER — Other Ambulatory Visit: Payer: Self-pay | Admitting: Family Medicine

## 2020-03-25 DIAGNOSIS — Z23 Encounter for immunization: Secondary | ICD-10-CM | POA: Diagnosis not present

## 2020-04-07 DIAGNOSIS — Z23 Encounter for immunization: Secondary | ICD-10-CM | POA: Diagnosis not present

## 2020-04-27 ENCOUNTER — Encounter: Payer: Self-pay | Admitting: Family Medicine

## 2020-04-27 DIAGNOSIS — H40013 Open angle with borderline findings, low risk, bilateral: Secondary | ICD-10-CM | POA: Diagnosis not present

## 2020-04-27 DIAGNOSIS — E119 Type 2 diabetes mellitus without complications: Secondary | ICD-10-CM | POA: Diagnosis not present

## 2020-04-27 DIAGNOSIS — H524 Presbyopia: Secondary | ICD-10-CM | POA: Diagnosis not present

## 2020-04-27 DIAGNOSIS — H25813 Combined forms of age-related cataract, bilateral: Secondary | ICD-10-CM | POA: Diagnosis not present

## 2020-04-27 DIAGNOSIS — H16223 Keratoconjunctivitis sicca, not specified as Sjogren's, bilateral: Secondary | ICD-10-CM | POA: Diagnosis not present

## 2020-04-27 LAB — HM DIABETES EYE EXAM

## 2020-05-05 ENCOUNTER — Ambulatory Visit: Payer: Medicare Other

## 2020-05-05 NOTE — Progress Notes (Signed)
Chronic Care Management Pharmacy  Name: [name] [name]  MRN: [id number] DOB: [birth year]  Chief Complaint/ HPI  [name] [name],  69 y.o. , male presents for their Initial CCM visit with the clinical pharmacist via telephone due to COVID-19 Pandemic.  PCP : [name] Barrack, MD Future Appointments  Date Time Provider [facility] [place]  09/02/2020  9:20 AM [name] Barrack, MD [facility]  11/04/2020  1:00 PM [facility] CCM PHARMACIST [facility]  03/18/2021 10:00 AM [facility] HEALTH COACH [facility]   Chronic conditions include:  No diagnosis found.  Office Visits:  10/22/2019 (PCP): hematuria, likely kidney stone that had already passed. Glucose 150 while in ED. 65monthrecheck on a1c.eat breakfast within 1 hour of getting up, 8 cups h2o/day.  Patient Active Problem List   Diagnosis Date Noted  . Nephrolithiasis 03/05/2020  . Labral tear of long head of biceps tendon, left, sequela   . Complete tear of left rotator cuff   . Diverticulosis of colon (without mention of hemorrhage) 04/25/2011  . Adenomatous colon polyp 03/17/2011  . LOW BACK PAIN, CHRONIC 12/13/2007  . Hyperlipidemia associated with type 2 diabetes mellitus ([place] 06/08/2007  . T2DM (type 2 diabetes mellitus) ([place] 02/27/2007  . Hypertension associated with diabetes ([place] 02/27/2007  . AMBLYOPIA 12/06/2006  . GERD 12/06/2006   Past Surgical History:  Procedure Laterality Date  . BACK SURGERY    . CARDIAC CATHETERIZATION  2007; 03/26/2014  . COLONOSCOPY  2006   tics , ADENOMATOUS polyps. F/U due 2011  . COLONOSCOPY  2012   Tics  . CORONARY ANGIOPLASTY WITH STENT PLACEMENT  04/01/2014   "2"  . ESOPHAGEAL DILATION  2003  . ESOPHAGOGASTRODUODENOSCOPY  2003   ERD, stricture dilated  . EXCISIONAL HEMORRHOIDECTOMY  ~ 2008  . LEFT HEART CATHETERIZATION WITH CORONARY ANGIOGRAM N/A 03/26/2014   Procedure: LEFT HEART CATHETERIZATION WITH CORONARY ANGIOGRAM;  Surgeon: Peter M JMartinique MD;  [facility];  Service:  Cardiovascular;  Laterality: N/A;  . PERCUTANEOUS CORONARY STENT INTERVENTION (PCI-S) N/A 04/01/2014   Procedure: PERCUTANEOUS CORONARY STENT INTERVENTION (PCI-S);  Surgeon: Peter M JMartinique MD;  [facility];  Service: Cardiovascular;  Laterality: N/A;  . POSTERIOR LAMINECTOMY / DECOMPRESSION LUMBAR SPINE  12/2009   L4 , Dr NSherwood Gambler . SHOULDER ARTHROSCOPY WITH ROTATOR CUFF REPAIR AND OPEN BICEPS TENODESIS Left 02/04/2019   Procedure: LEFT SHOULDER ARTHROSCOPY, BICEPS TENODESIS, MINI OPEN ROTATOR CUFF TEAR REPAIR;  Surgeon: DMeredith Pel MD;  [facility]  Service: Orthopedics;  Laterality: Left;   Social History   Socioeconomic History  . Marital status: Single    Spouse name: Not on file  . Number of children: 0  . Years of education: 156 . Highest education level: Not on file  Occupational History  . Occupation: Retired   Tobacco Use  . Smoking status: Never Smoker  . Smokeless tobacco: Never Used  Vaping Use  . Vaping Use: Never used  Substance and Sexual Activity  . Alcohol use: Yes    Alcohol/week: 2.0 standard drinks    Types: 2 Cans of beer per week    Comment: social  . Drug use: No  . Sexual activity: Not Currently  Other Topics Concern  . Not on file  Social History Narrative   Fun: GDesigner, jewellery    Social Determinants of Health   Financial Resource Strain: Low Risk   . Difficulty of Paying Living Expenses: Not hard at all  Food Insecurity: No Food Insecurity  . Worried About Charity fundraiser in the Last Year: Never true  . Ran Out of Food in the Last Year: Never true  Transportation Needs: No Transportation Needs  . Lack of Transportation (Medical): No  . Lack of Transportation (Non-Medical): No  Physical Activity: Inactive  . Days of Exercise per Week: 0 days  . Minutes of Exercise per Session: 0 min  Stress: No Stress Concern Present  . Feeling of Stress : Not at all  Social Connections: Socially Isolated  .  Frequency of Communication with Friends and Family: Three times a week  . Frequency of Social Gatherings with Friends and Family: Three times a week  . Attends Religious Services: Never  . Active Member of Clubs or Organizations: No  . Attends Archivist Meetings: Never  . Marital Status: Never married  SDOH (Social Determinants of Health) assessments performed: Yes. Family History  Problem Relation Age of Onset  . Heart attack Mother 30  . Stroke Mother 12  . Prostate cancer Father   . Diabetes Father   . Heart disease Father 108       CBAG   . Diabetes Brother   . Heart attack Brother   . Breast cancer Maternal Aunt   . Heart attack Maternal Grandfather 61  . Diabetes Paternal Grandmother   . Stroke Paternal Grandfather 30  . Heart disease Sister    Allergies  Allergen Reactions  . Aspirin Other (See Comments)    Angioedema  . Other Hives  . Oxycodone     nausea  . Contrast Media [Iodinated Diagnostic Agents] Itching and Rash    Developed hives despite pre treatment  . Norvasc [Amlodipine Besylate] Other (See Comments)    09/12/2013 gingival hyperplasia , Dr Geralynn Ochs ,DDS   Outpatient Encounter Medications as of 05/05/2020  Medication Sig Note  . atorvastatin (LIPITOR) 80 MG tablet TAKE 1 TABLET DAILY AT 6PM   . carvedilol (COREG) 3.125 MG tablet TAKE 1 TABLET TWICE DAILY  WITH MEALS   . clopidogrel (PLAVIX) 75 MG tablet TAKE 1 TABLET DAILY   . Coenzyme Q10 (CO Q 10 PO) Take 200 mg by mouth daily.    . Cyanocobalamin (VITAMIN B 12 PO) Take 1 tablet by mouth daily.   Marland Kitchen glucose blood (ONE TOUCH ULTRA TEST) test strip Use one strip per test. Test blood sugars 1-4 times daily as instructed.   . irbesartan (AVAPRO) 300 MG tablet TAKE 1 TABLET DAILY   . metFORMIN (GLUCOPHAGE) 500 MG tablet Take by mouth 2 (two) times daily with a meal.   . sildenafil (REVATIO) 20 MG tablet Take 1-5 tablets by mouth daily as needed.   Marland Kitchen spironolactone (ALDACTONE) 25 MG tablet TAKE 1 TABLET  DAILY   . benzonatate (TESSALON PERLES) 100 MG capsule Take 1 capsule (100 mg total) by mouth 3 (three) times daily as needed. 03/12/2020: As needed  . Misc Natural Products (GLUCOSAMINE CHOND COMPLEX/MSM) TABS    . Multiple Vitamins-Minerals (MULTIVITAMIN WITH MINERALS) tablet Take 1 tablet by mouth daily.   . nitroGLYCERIN (NITROSTAT) 0.4 MG SL tablet Place 1 tablet (0.4 mg total) under the tongue every 5 (five) minutes as needed for chest pain. 03/12/2020: As needed   No facility-administered encounter medications on file as of 05/05/2020.   Patient Care Team    Relationship Specialty Notifications Start End  [name] Barrack, MD PCP - General Family Medicine  11/10/17   Martinique, Peter M, MD PCP -  Cardiology Cardiology Admissions 01/24/19   Calton Dach, MD Consulting Physician Optometry  02/12/18   Meredith Pel, MD Consulting Physician Orthopedic Surgery  03/05/19   [facility] Consulting Physician   03/05/19   Madelin Rear, [facility] Pharmacist Pharmacist  01/27/20    Comment: [phone]   Current Diagnosis/Assessment: Goals Addressed            This Visit's Progress   . PharmD Care Plan   On track    CARE PLAN ENTRY (see longitudinal plan of care for additional care plan information)  Current Barriers:  . Chronic Disease Management support, education, and care coordination needs related to Hypertension, Hyperlipidemia, and Diabetes   Hypertension BP Readings from Last 3 Encounters:  03/05/20 (!) 149/78  02/20/20 119/82  10/22/19 110/70   . Pharmacist Clinical Goal(s): o Over the next 180 days, patient will work with PharmD and providers to maintain BP goal <140/90 . Current regimen:  o Metformin 500 mg twice daily  . Interventions: o Review switch to metformin XR 1000 mg daily with evening meal  . Patient self care activities - Over the next 180 days, patient will: o Check BP once every 1-2 weeks, document, and provide at future appointments o Ensure  daily salt intake < 2300 mg/day  Hyperlipidemia Lab Results  Component Value Date/Time   LDLCALC 68 03/05/2020 10:09 AM   . Pharmacist Clinical Goal(s): o Over the next 180 days, patient will work with PharmD and providers to maintain LDL goal < 100 . Current regimen:  o Atorvastatin 80 mg once daily . Interventions: o Continue current management . Patient self care activities - Over the next 180 days, patient will: o Continue current management  Diabetes Lab Results  Component Value Date/Time   HGBA1C 7.5 (H) 03/05/2020 10:09 AM   HGBA1C 7.2 (A) 08/28/2019 10:22 AM   HGBA1C 7.2 (H) 02/21/2019 11:23 AM   . Pharmacist Clinical Goal(s): o Over the next 180 days, patient will work with PharmD and providers to achieve A1c goal <7% . Current regimen:  o Metformin 500 mg twice daily with meals . Interventions: o Consider switch to metformin XL 1000 mg once daily . Patient self care activities - Over the next 180 days, patient will: o Check blood sugar as directed, document, and provide at future appointments o Contact provider with any episodes of hypoglycemia  Medication management . Pharmacist Clinical Goal(s): o Over the next 180 days, patient will work with PharmD and providers to achieve optimal medication adherence . Current pharmacy: Mail order . Interventions o Comprehensive medication review performed. o Continue current medication management strategy. . Patient self care activities - Over the next 180 days, patient will: o Focus on medication adherence by reporting any questions or concerns to PharmD and/or provider(s)  Please see past updates related to this goal by clicking on the "Past Updates" button in the selected goal .      Hypertension   BP goal <140/90  BP Readings from Last 3 Encounters:  03/05/20 (!) 149/78  02/20/20 119/82  10/22/19 110/70   Patient checks BP at home weekly. Mentions previously low systolic less than 098 at home, has since  improved to greater than 119 systolic.  Patient is currently at goal on the following medications:  . Carvedilol 3.25 mg twice daily . Irbesartan 300 mg once every night . Spironolactone 25 mg once daily   We discussed diet and exercise extensively.  Plan  Continue current medications and control  with diet and exercise.   Diabetes   A1c goal < 7%  Lab Results  Component Value Date/Time   HGBA1C 7.5 (H) 03/05/2020 10:09 AM   HGBA1C 7.2 (A) 08/28/2019 10:22 AM   HGBA1C 7.2 (H) 02/21/2019 11:23 AM   MICROALBUR 0.7 01/17/2017 10:59 AM   MICROALBUR 0.8 03/19/2014 08:51 AM    Checking BG: Rarely. Recent FBG readings n/a. Previously discussed switch to XL preparation. Reviewed with insurance to confirm metformin XR 500 mg is tier 1(metformin 1000 mg osm modified release is not tier 1). Could do two of these once daily with supper. Patient is currently taking: . Metformin IR 500 mg BID  We reviewed: Diet/exercise recommendations.  Plan  Recommend switch to MetFORMIN XR (GLUCOPHAGE XR) 500 MG tablet, Take 2 tablets by mouth once daily with supper., Disp: 180 , Rfl: 3. Continue current medications and control with diet and exercise.  Hyperlipidemia   LDL goal < 70  Lipid Panel     Component Value Date/Time   CHOL 122 03/05/2020 1009   CHOL 132 08/18/2016 0814   TRIG 117 03/05/2020 1009   TRIG 82 04/11/2006 0804   HDL 34 (L) 03/05/2020 1009   HDL 41 08/18/2016 0814   LDLCALC 68 03/05/2020 1009    Hepatic Function Latest Ref Rng & Units 03/05/2020 10/21/2019 02/21/2019  Total Protein 6.1 - 8.1 g/dL 6.2 7.6 6.5  Albumin 3.5 - 5.0 g/dL - 4.5 4.0  AST 10 - 35 U/L _0 ALT 9 - 46 U/L _1 Alk Phosphatase 38 - 126 U/L - 85 108  Total Bilirubin 0.2 - 1.2 mg/dL 0.8 0.7 0.6  Bilirubin, Direct 0.00 - 0.40 mg/dL - - -    The ASCVD Risk score Mikey Bussing DC Jr., et al., 2013) failed to calculate for the following reasons:   The valid total cholesterol range is 130 to 320 mg/dL    Patient is currently controlled on the following medications:  . Atorvastatin 80 mg once daily at 6 pm   We discussed:  diet and exercise extensively.  Plan  Continue current medications and control with diet and exercise.  Vaccines   Immunization History  Administered Date(s) Administered  . Fluad Quad(high Dose 65+) 02/21/2019  . Influenza,inj,Quad PF,6+ Mos 03/19/2014  . PFIZER SARS-COV-2 Vaccination 08/01/2019, 08/27/2019  . Pneumococcal Conjugate-13 01/17/2017  . Pneumococcal Polysaccharide-23 01/31/2018  . Td 12/18/2008  . Zoster 12/24/2012   Reviewed and discussed patient's vaccination history.    Pfizer covid booster - done at [facility], 03/25/2020. Tetanus and flu shot HD - both done at [facility], 04/07/2020. Reports receiving two doses of shingrix.  Plan  Recommended patient receive shingrix, tdap vaccine in pharmacy.   Medication Management Coordination   Receives prescription medications from:  [facility] [address] [place] [address] [place] [address] Phone: [phone] Fax: [phone]  Continue current management.  Plan  Continue current medication management strategy. ___________________________ Future Appointments  Date Time Provider [place]  09/02/2020  9:20 AM [name] Barrack, MD [facility]  11/04/2020  1:00 PM [facility] CCM PHARMACIST [facility]  03/18/2021 10:00 AM [facility] HEALTH COACH [facility]   Visit follow-up:  . RPH follow-up: 6 month phone visit.  Madelin Rear, Pharm.D., BCGP Clinical Pharmacist [place] [phone]

## 2020-05-05 NOTE — Patient Instructions (Addendum)
[name] [name],   Thank you for talking with me today. If you have any questions about metformin or any other medications please give me a call at [phone].  Thank you, Ellin Mayhew, Pharm.D., BCGP Clinical Pharmacist [place] [phone]  Goals Addressed            This Visit's Progress   . PharmD Care Plan   On track    CARE PLAN ENTRY (see longitudinal plan of care for additional care plan information)  Current Barriers:  . Chronic Disease Management support, education, and care coordination needs related to Hypertension, Hyperlipidemia, and Diabetes   Hypertension BP Readings from Last 3 Encounters:  03/05/20 (!) 149/78  02/20/20 119/82  10/22/19 110/70   . Pharmacist Clinical Goal(s): o Over the next 180 days, patient will work with PharmD and providers to maintain BP goal <140/90 . Current regimen:  o Metformin 500 mg twice daily  . Interventions: o Review switch to metformin XR 1000 mg daily with evening meal  . Patient self care activities - Over the next 180 days, patient will: o Check BP once every 1-2 weeks, document, and provide at future appointments o Ensure daily salt intake < 2300 mg/day  Hyperlipidemia Lab Results  Component Value Date/Time   LDLCALC 68 03/05/2020 10:09 AM   . Pharmacist Clinical Goal(s): o Over the next 180 days, patient will work with PharmD and providers to maintain LDL goal < 100 . Current regimen:  o Atorvastatin 80 mg once daily . Interventions: o Continue current management . Patient self care activities - Over the next 180 days, patient will: o Continue current management  Diabetes Lab Results  Component Value Date/Time   HGBA1C 7.5 (H) 03/05/2020 10:09 AM   HGBA1C 7.2 (A) 08/28/2019 10:22 AM   HGBA1C 7.2 (H) 02/21/2019 11:23 AM   . Pharmacist Clinical Goal(s): o Over the next 180 days, patient will work with PharmD and providers to achieve A1c goal <7% . Current regimen:  o Metformin 500  mg twice daily with meals . Interventions: o Consider switch to metformin XL 1000 mg once daily . Patient self care activities - Over the next 180 days, patient will: o Check blood sugar as directed, document, and provide at future appointments o Contact provider with any episodes of hypoglycemia  Medication management . Pharmacist Clinical Goal(s): o Over the next 180 days, patient will work with PharmD and providers to achieve optimal medication adherence . Current pharmacy: Mail order . Interventions o Comprehensive medication review performed. o Continue current medication management strategy. . Patient self care activities - Over the next 180 days, patient will: o Focus on medication adherence by reporting any questions or concerns to PharmD and/or provider(s)  Please see past updates related to this goal by clicking on the "Past Updates" button in the selected goal .      The patient verbalized understanding of instructions provided today and agreed to receive a mailed copy of patient instruction and/or educational materials. Telephone follow up appointment with pharmacy team member scheduled for: See next appointment with "Care Management Staff" under "What's Next" below.   Madelin Rear, Pharm.D., BCGP Clinical Pharmacist [facility] [phone]

## 2020-05-20 ENCOUNTER — Encounter: Payer: Self-pay | Admitting: Family Medicine

## 2020-06-20 ENCOUNTER — Other Ambulatory Visit: Payer: Self-pay | Admitting: Family Medicine

## 2020-06-27 DIAGNOSIS — Z87442 Personal history of urinary calculi: Secondary | ICD-10-CM

## 2020-06-27 HISTORY — DX: Personal history of urinary calculi: Z87.442

## 2020-07-20 ENCOUNTER — Telehealth: Payer: Self-pay

## 2020-07-20 NOTE — Chronic Care Management (AMB) (Signed)
Chronic Care Management Pharmacy Assistant   Name: [name] [name]  MRN: [id number] DOB: July 10, 1950  Reason for Encounter: Disease State / General Adherence Call  PCP : Vivi Barrack, MD  Allergies:   Allergies  Allergen Reactions  . Aspirin Other (See Comments)    Angioedema  . Other Hives  . Oxycodone     nausea  . Contrast Media [Iodinated Diagnostic Agents] Itching and Rash    Developed hives despite pre treatment  . Norvasc [Amlodipine Besylate] Other (See Comments)    09/12/2013 gingival hyperplasia , Dr Geralynn Ochs ,DDS    Medications: Outpatient Encounter Medications as of 07/20/2020  Medication Sig Note  . atorvastatin (LIPITOR) 80 MG tablet TAKE 1 TABLET DAILY AT 6PM   . benzonatate (TESSALON PERLES) 100 MG capsule Take 1 capsule (100 mg total) by mouth 3 (three) times daily as needed. 03/12/2020: As needed  . carvedilol (COREG) 3.125 MG tablet TAKE 1 TABLET TWICE DAILY  WITH MEALS   . clopidogrel (PLAVIX) 75 MG tablet TAKE 1 TABLET DAILY   . Coenzyme Q10 (CO Q 10 PO) Take 200 mg by mouth daily.    . Cyanocobalamin (VITAMIN B 12 PO) Take 1 tablet by mouth daily.   Marland Kitchen glucose blood (ONE TOUCH ULTRA TEST) test strip Use one strip per test. Test blood sugars 1-4 times daily as instructed.   . irbesartan (AVAPRO) 300 MG tablet TAKE 1 TABLET DAILY   . metFORMIN (GLUCOPHAGE) 500 MG tablet Take by mouth 2 (two) times daily with a meal.   . Misc Natural Products (GLUCOSAMINE CHOND COMPLEX/MSM) TABS    . Multiple Vitamins-Minerals (MULTIVITAMIN WITH MINERALS) tablet Take 1 tablet by mouth daily.   . nitroGLYCERIN (NITROSTAT) 0.4 MG SL tablet Place 1 tablet (0.4 mg total) under the tongue every 5 (five) minutes as needed for chest pain. 03/12/2020: As needed  . sildenafil (REVATIO) 20 MG tablet Take 1-5 tablets by mouth daily as needed.   Marland Kitchen spironolactone (ALDACTONE) 25 MG tablet TAKE 1 TABLET DAILY    No facility-administered encounter medications on file as of 07/20/2020.     Current Diagnosis: Patient Active Problem List   Diagnosis Date Noted  . Nephrolithiasis 03/05/2020  . Labral tear of long head of biceps tendon, left, sequela   . Complete tear of left rotator cuff   . Diverticulosis of colon (without mention of hemorrhage) 04/25/2011  . Adenomatous colon polyp 03/17/2011  . LOW BACK PAIN, CHRONIC 12/13/2007  . Hyperlipidemia associated with type 2 diabetes mellitus ([place]) 06/08/2007  . T2DM (type 2 diabetes mellitus) ([place]) 02/27/2007  . Hypertension associated with diabetes ([place]) 02/27/2007  . AMBLYOPIA 12/06/2006  . GERD 12/06/2006    Have you seen any other providers since your last visit with Madelin Rear, Pharm.D., BCGP?  No, Patient has not seen any other providers since his last visit with Madelin Rear, Pharm.D., BCGP.  Have you had any problems recently with your health?  Patient states there were times when he would feel dizzy early in the morning. Reports BP at 90/60 when he was taking his blood pressure medication in the morning instead of at night. Patient states he has since switched back to taking his blood pressure medication at night and states his blood pressure readings have normalized to 125/80.  Have you had any problems with your pharmacy?  Patient states he has not had any problems with his pharmacy.  What issues or side effects are you having with your medications?  Patient  states he has been experiencing diarrhea with his medication Metformin.  What would you like me to pass along to Madelin Rear, Speed.D., BCGP for them to help you with?   Patient states he has not heard back in regards to a review switch to Metformin XR 1000 mg daily with evening meal.  What can we do to take care of you better?  Patient states we are doing a great job.  [name], [place] Pharmacist Assistant [phone]    Follow-Up:  Pharmacist Review

## 2020-07-23 ENCOUNTER — Other Ambulatory Visit: Payer: Self-pay | Admitting: *Deleted

## 2020-07-23 MED ORDER — METFORMIN HCL ER 500 MG PO TB24: ORAL_TABLET | ORAL | 3 refills | Status: DC

## 2020-07-23 NOTE — Telephone Encounter (Signed)
Rx send to [facility] mail service, patient aware

## 2020-07-23 NOTE — Telephone Encounter (Signed)
Hi [name] [name], that is fine with me. Team can we send in the new rx for the extended release?  Thanks!  Algis Greenhouse. [name], MD 07/23/2020 9:21 AM

## 2020-07-30 ENCOUNTER — Telehealth: Payer: Self-pay

## 2020-07-30 NOTE — Chronic Care Management (AMB) (Signed)
    Chronic Care Management Pharmacy Assistant   Name: [name] [name]  MRN: [id number] DOB: 1951/05/28  Reason for Encounter: Patient Call  Patient called and left a voicemail stating that he recently started taking Metformin ER tablets. Patient states the directions say to take two tablets at dinner time. Patient states he would prefer to take the medication in the morning with his other medications. Is that okay?  Patient also stated the medication has been giving him diarrhea. What should he do?  Please advise,  [name], [place] Pharmacist Assistant [phone]   Follow-Up:  Pharmacist Review

## 2020-07-30 NOTE — Telephone Encounter (Signed)
Spoke to pt, addressed concerns/questions on metformin. Encouraged pt to reach out with any additional questions.

## 2020-08-27 ENCOUNTER — Telehealth: Payer: Self-pay

## 2020-08-27 NOTE — Chronic Care Management (AMB) (Signed)
Chronic Care Management Pharmacy Assistant   Name: [name] [name]  MRN: [id number] DOB: 07-31-1950  Reason for Encounter: Disease State/ Diabetes Adherence Call  PCP : Vivi Barrack, MD  Allergies:   Allergies  Allergen Reactions  . Aspirin Other (See Comments)    Angioedema  . Other Hives  . Oxycodone     nausea  . Contrast Media [Iodinated Diagnostic Agents] Itching and Rash    Developed hives despite pre treatment  . Norvasc [Amlodipine Besylate] Other (See Comments)    09/12/2013 gingival hyperplasia , Dr Geralynn Ochs ,DDS    Medications: Outpatient Encounter Medications as of 08/27/2020  Medication Sig Note  . atorvastatin (LIPITOR) 80 MG tablet TAKE 1 TABLET DAILY AT 6PM   . benzonatate (TESSALON PERLES) 100 MG capsule Take 1 capsule (100 mg total) by mouth 3 (three) times daily as needed. 03/12/2020: As needed  . carvedilol (COREG) 3.125 MG tablet TAKE 1 TABLET TWICE DAILY  WITH MEALS   . clopidogrel (PLAVIX) 75 MG tablet TAKE 1 TABLET DAILY   . Coenzyme Q10 (CO Q 10 PO) Take 200 mg by mouth daily.    . Cyanocobalamin (VITAMIN B 12 PO) Take 1 tablet by mouth daily.   Marland Kitchen glucose blood (ONE TOUCH ULTRA TEST) test strip Use one strip per test. Test blood sugars 1-4 times daily as instructed.   . irbesartan (AVAPRO) 300 MG tablet TAKE 1 TABLET DAILY   . metFORMIN (GLUCOPHAGE-XR) 500 MG 24 hr tablet Take 2 tablet (1000Mg  total) by mouth daily after supper   . Misc Natural Products (GLUCOSAMINE CHOND COMPLEX/MSM) TABS    . Multiple Vitamins-Minerals (MULTIVITAMIN WITH MINERALS) tablet Take 1 tablet by mouth daily.   . nitroGLYCERIN (NITROSTAT) 0.4 MG SL tablet Place 1 tablet (0.4 mg total) under the tongue every 5 (five) minutes as needed for chest pain. 03/12/2020: As needed  . sildenafil (REVATIO) 20 MG tablet Take 1-5 tablets by mouth daily as needed.   Marland Kitchen spironolactone (ALDACTONE) 25 MG tablet TAKE 1 TABLET DAILY    No facility-administered encounter medications on file as of  08/27/2020.    Current Diagnosis: Patient Active Problem List   Diagnosis Date Noted  . Nephrolithiasis 03/05/2020  . Labral tear of long head of biceps tendon, left, sequela   . Complete tear of left rotator cuff   . Diverticulosis of colon (without mention of hemorrhage) 04/25/2011  . Adenomatous colon polyp 03/17/2011  . LOW BACK PAIN, CHRONIC 12/13/2007  . Hyperlipidemia associated with type 2 diabetes mellitus ([place]) 06/08/2007  . T2DM (type 2 diabetes mellitus) ([place]) 02/27/2007  . Hypertension associated with diabetes ([place]) 02/27/2007  . AMBLYOPIA 12/06/2006  . GERD 12/06/2006    Reviewed chart for medication changes ahead of disease state call.  No OVs, Consults, or hospital visits since last care coordination call/Pharmacist visit.  No medication changes indicated.  Recent Relevant Labs: Lab Results  Component Value Date/Time   HGBA1C 7.5 (H) 03/05/2020 10:09 AM   HGBA1C 7.2 (A) 08/28/2019 10:22 AM   HGBA1C 7.2 (H) 02/21/2019 11:23 AM   MICROALBUR 0.7 01/17/2017 10:59 AM   MICROALBUR 0.8 03/19/2014 08:51 AM    Kidney Function Lab Results  Component Value Date/Time   CREATININE 0.96 03/05/2020 10:09 AM   CREATININE 1.07 10/21/2019 01:11 AM   CREATININE 0.94 02/21/2019 10:51 AM   CREATININE 1.00 07/09/2015 09:57 AM   GFR 79.55 02/21/2019 10:51 AM   GFRNONAA >60 10/21/2019 01:11 AM   GFRAA >60 10/21/2019 01:11 AM    .  Current antihyperglycemic regimen:  o Metformin XR 500 mg tablet; take two tablets once a day with a meal  . What recent interventions/DTPs have been made to improve glycemic control:  o Patient recently started XR Metformin 1000 mg with evening meal in place of non extended release Metformin 500 mg twice a day.  . Have there been any recent hospitalizations or ED visits since last visit with CPP? No, patient has not had any recent hospitalizations or ED visits since his last visit with Madelin Rear, CPP.  Marland Kitchen Patient denies hypoglycemic symptoms,  including Pale, Sweaty, Shaky, Hungry, Nervous/irritable and Vision changes   . Patient denies hyperglycemic symptoms, including blurry vision, excessive thirst, fatigue, polyuria and weakness   . How often are you checking your blood sugar? Patient states he does not check his blood sugars at home at this time.  . What are your blood sugars ranging?  o Fasting: n/a o Before meals: n/a o After meals: n/a o Bedtime: n/a  . During the week, how often does your blood glucose drop below 70? unknown, patient denies any hypoglycemic symptoms.   . Are you checking your feet daily/regularly?   Adherence Review: Is the patient currently on a STATIN medication? Yes Is the patient currently on ACE/ARB medication? Yes Does the patient have >5 day gap between last estimated fill dates? No  Patient states his diarrhea episodes have improved since taking his Metformin with the largest meal of the day. Patient states he has had two episodes of diarrhea but it does not happen regularly. He states he is overall happy with the medication.  Patient states he had a friend give him Vitamin B 12 1,000 mcg. Patient would like to know if this is an okay amount to be taking daily. Patient states he was previously only taking 100 mcg a day.   Future Appointments  Date Time Provider [place]  09/02/2020  9:20 AM Vivi Barrack, MD [facility]  09/18/2020  3:40 PM Martinique, Peter M, MD [facility]  11/04/2020  1:00 PM [facility] PHARMACIST [facility]  03/18/2021 10:00 AM [facility]     [name], [place] Pharmacist Assistant [phone]    Follow-Up:  Pharmacist Review

## 2020-08-27 NOTE — Telephone Encounter (Signed)
-----   Message from [name] sent at 08/27/2020  1:42 PM EST ----- Patient states he had a friend give him Vitamin B 12 1,000 mcg. Patient would like to know if this is an okay amount to be taking daily. Patient states he was previously only taking 100 mcg a day

## 2020-08-27 NOTE — Chronic Care Management (AMB) (Signed)
Patient is aware and verbally understands.  Patient has another medication question. Patient states he takes glucosamine 1500 mg a day. Patient would like to know if this medication can cause his sugars to go up? Please advise.  [name], [place] Pharmacist Assistant [phone]

## 2020-09-02 ENCOUNTER — Encounter: Payer: Self-pay | Admitting: Family Medicine

## 2020-09-02 ENCOUNTER — Other Ambulatory Visit: Payer: Self-pay

## 2020-09-02 ENCOUNTER — Ambulatory Visit (INDEPENDENT_AMBULATORY_CARE_PROVIDER_SITE_OTHER): Payer: Medicare Other | Admitting: Family Medicine

## 2020-09-02 VITALS — BP 123/78 | HR 51 | Temp 98.2°F | Ht 69.0 in | Wt 191.6 lb

## 2020-09-02 DIAGNOSIS — E1165 Type 2 diabetes mellitus with hyperglycemia: Secondary | ICD-10-CM | POA: Diagnosis not present

## 2020-09-02 DIAGNOSIS — I152 Hypertension secondary to endocrine disorders: Secondary | ICD-10-CM | POA: Diagnosis not present

## 2020-09-02 DIAGNOSIS — E1169 Type 2 diabetes mellitus with other specified complication: Secondary | ICD-10-CM

## 2020-09-02 DIAGNOSIS — E785 Hyperlipidemia, unspecified: Secondary | ICD-10-CM

## 2020-09-02 DIAGNOSIS — M75122 Complete rotator cuff tear or rupture of left shoulder, not specified as traumatic: Secondary | ICD-10-CM

## 2020-09-02 DIAGNOSIS — E1159 Type 2 diabetes mellitus with other circulatory complications: Secondary | ICD-10-CM | POA: Diagnosis not present

## 2020-09-02 LAB — COMPREHENSIVE METABOLIC PANEL
ALT: 21 U/L (ref 0–53)
AST: 14 U/L (ref 0–37)
Albumin: 4 g/dL (ref 3.5–5.2)
Alkaline Phosphatase: 82 U/L (ref 39–117)
BUN: 15 mg/dL (ref 6–23)
CO2: 29 mEq/L (ref 19–32)
Calcium: 9.6 mg/dL (ref 8.4–10.5)
Chloride: 103 mEq/L (ref 96–112)
Creatinine, Ser: 0.98 mg/dL (ref 0.40–1.50)
GFR: 78.07 mL/min (ref >60.00)
Glucose, Bld: 129 mg/dL — ABNORMAL HIGH (ref 70–99)
Potassium: 4.3 mEq/L (ref 3.5–5.1)
Sodium: 139 mEq/L (ref 135–145)
Total Bilirubin: 0.6 mg/dL (ref 0.2–1.2)
Total Protein: 6.4 g/dL (ref 6.0–8.3)

## 2020-09-02 LAB — HEMOGLOBIN A1C: Hgb A1c MFr Bld: 7.4 % — ABNORMAL HIGH (ref 4.6–6.5)

## 2020-09-02 LAB — LIPID PANEL
Cholesterol: 110 mg/dL (ref 0–200)
HDL: 35.8 mg/dL — ABNORMAL LOW (ref >39.00)
LDL Cholesterol: 54 mg/dL (ref 0–99)
NonHDL: 73.8
Total CHOL/HDL Ratio: 3
Triglycerides: 100 mg/dL (ref 0.0–149.0)
VLDL: 20 mg/dL (ref 0.0–40.0)

## 2020-09-02 LAB — CBC
HCT: 41.9 % (ref 39.0–52.0)
Hemoglobin: 14.4 g/dL (ref 13.0–17.0)
MCHC: 34.3 g/dL (ref 30.0–36.0)
MCV: 97.3 fl (ref 78.0–100.0)
Platelets: 205 10*3/uL (ref 150.0–400.0)
RBC: 4.31 Mil/uL (ref 4.22–5.81)
RDW: 13.4 % (ref 11.5–15.5)
WBC: 8.2 10*3/uL (ref 4.0–10.5)

## 2020-09-02 LAB — TSH: TSH: 0.96 u[IU]/mL (ref 0.35–4.50)

## 2020-09-02 NOTE — Assessment & Plan Note (Signed)
Check A1c.  He is on Metformin 1000 mg daily.  He is having some diarrhea that is currently manageable.  He will let me know if this continues to be an issue and we may consider switching to Ozempic or another medication.

## 2020-09-02 NOTE — Assessment & Plan Note (Signed)
At goal.  Continue Coreg 3.125 mg twice daily, irbesartan 300 mg daily, and spironolactone 25 mg daily.  Check labs today.

## 2020-09-02 NOTE — Assessment & Plan Note (Signed)
Check lipids.  Continue Lipitor 80 mg daily. 

## 2020-09-02 NOTE — Progress Notes (Signed)
   [name] [name] is a 70 y.o. male who presents today for an office visit.  Assessment/Plan:  Chronic Problems Addressed Today: Hypertension associated with diabetes ([place]) At goal.  Continue Coreg 3.125 mg twice daily, irbesartan 300 mg daily, and spironolactone 25 mg daily.  Check labs today.  Hyperlipidemia associated with type 2 diabetes mellitus (H[facility]) Check lipids.  Continue Lipitor 80 mg daily.  T2DM (type 2 diabetes mellitus) ([facility]) Check A1c.  He is on Metformin 1000 mg daily.  He is having some diarrhea that is currently manageable.  He will let me know if this continues to be an issue and we may consider switching to Ozempic or another medication.     Subjective:  HPI:  See A/p.         Objective:  Physical Exam: BP 123/78   Pulse (!) 51   Temp 98.2 F (36.8 C) (Temporal)   Ht 5\' 9"  (1.753 m)   Wt 191 lb 9.6 oz (86.9 kg)   SpO2 99%   BMI 28.29 kg/m   Gen: No acute distress, resting comfortably CV: Regular rate and rhythm with no murmurs appreciated Pulm: Normal work of breathing, clear to auscultation bilaterally with no crackles, wheezes, or rhonchi Neuro: Grossly normal, moves all extremities Psych: Normal affect and thought content      [name] [name] M. [name], MD 09/02/2020 10:06 AM

## 2020-09-02 NOTE — Patient Instructions (Signed)
It was very nice to see you today!  We will check blood work.  I will see you back in 6 months.  Come back to see me sooner if needed.   Take care, Dr [name]  Please try these tips to maintain a healthy lifestyle:   Eat at least 3 REAL meals and 1-2 snacks per day.  Aim for no more than 5 hours between eating.  If you eat breakfast, please do so within one hour of getting up.    Each meal should contain half fruits/vegetables, one quarter protein, and one quarter carbs (no bigger than a computer mouse)   Cut down on sweet beverages. This includes juice, soda, and sweet tea.     Drink at least 1 glass of water with each meal and aim for at least 8 glasses per day   Exercise at least 150 minutes every week.

## 2020-09-04 NOTE — Progress Notes (Signed)
Please inform patient of the following:  Labs are all stable. Do not need to make any changes to his treatment plan at this time. We can recheck his A1c again in 6 months and his other labs in 1 year.  Algis Greenhouse. [name], MD 09/04/2020 4:11 PM

## 2020-09-16 NOTE — Progress Notes (Unsigned)
[name] [name] Date of Birth: March 02, 1951 Medical Record #[id number]  History of Present Illness: [name] [name] is seen for cardiac followup CAD. He has a history of coronary disease diagnosed by cardiac CTA in 2007. In September 2015 he presented with chest pain. Myoview study demonstrated anterior wall ischemia. Subsequent cardiac cath demonstrated severe disease in the mid LAD and first diagonal with 50-70% disease in the OM2. On 04/01/14 he had successful stenting of the LAD and diagonal with DES. FFR of the OM was normal. Despite aggressive pretreatment for dye allergy with steroids, pepcid, and benadryl he still developed hives and diarrhea post procedure.   In August 2020 he had left shoulder surgery without complications.   On follow up he is doing well. He states he is going to need TKR and this has limited his activity. He denies any chest pain or dyspnea but really didn't have significant symptoms prior to his stents before. Concerned about his risk given DM.  Current Outpatient Medications on File Prior to Visit  Medication Sig Dispense Refill  . atorvastatin (LIPITOR) 80 MG tablet TAKE 1 TABLET DAILY AT 6PM 90 tablet 2  . benzonatate (TESSALON PERLES) 100 MG capsule Take 1 capsule (100 mg total) by mouth 3 (three) times daily as needed. 20 capsule 0  . clopidogrel (PLAVIX) 75 MG tablet TAKE 1 TABLET DAILY 90 tablet 2  . Coenzyme Q10 (CO Q 10 PO) Take 200 mg by mouth daily.     Marland Kitchen glucose blood (ONE TOUCH ULTRA TEST) test strip Use one strip per test. Test blood sugars 1-4 times daily as instructed. 25 each 5  . metFORMIN (GLUCOPHAGE-XR) 500 MG 24 hr tablet Take 2 tablet (1000Mg  total) by mouth daily after supper 180 tablet 3  . Misc Natural Products (GLUCOSAMINE CHOND COMPLEX/MSM) TABS     . Multiple Vitamins-Minerals (MULTIVITAMIN WITH MINERALS) tablet Take 1 tablet by mouth daily.    . nitroGLYCERIN (NITROSTAT) 0.4 MG SL tablet Place 1 tablet (0.4 mg total) under the tongue every 5 (five)  minutes as needed for chest pain. 100 tablet 1  . sildenafil (REVATIO) 20 MG tablet Take 1-5 tablets by mouth daily as needed. 100 tablet 0  . spironolactone (ALDACTONE) 25 MG tablet TAKE 1 TABLET DAILY 90 tablet 2  . vitamin B-12 (CYANOCOBALAMIN) 1000 MCG tablet Take 1,000 mcg by mouth daily.    . Cyanocobalamin (VITAMIN B 12 PO) Take 1 tablet by mouth daily.     No current facility-administered medications on file prior to visit.    Allergies  Allergen Reactions  . Aspirin Other (See Comments)    Angioedema  . Other Hives  . Oxycodone     nausea  . Contrast Media [Iodinated Diagnostic Agents] Itching and Rash    Developed hives despite pre treatment  . Norvasc [Amlodipine Besylate] Other (See Comments)    09/12/2013 gingival hyperplasia , Dr Geralynn Ochs ,DDS    Past Medical History:  Diagnosis Date  . Adenomatous colon polyp 09/07/2004  . Amblyopia   . CAD (coronary artery disease)    cardiac CT with a 50% LAD lesion in 2007  . Diverticulosis   . DM2 (diabetes mellitus, type 2) ([place])   . Elevated LFTs    secondary to labetalol  . Esophageal stricture 2003  . External hemorrhoids 2012   lanced by Dr Rise Patience  . Family history of anesthesia complication    "sister had PONV"  . GERD (gastroesophageal reflux disease)    esophageal stricture, pmh  .  Hiatal hernia 01/04/00  . HLD (hyperlipidemia)   . HTN (hypertension)   . Hypopotassemia     Past Surgical History:  Procedure Laterality Date  . BACK SURGERY    . CARDIAC CATHETERIZATION  2007; 03/26/2014  . COLONOSCOPY  2006   tics , ADENOMATOUS polyps. F/U due 2011  . COLONOSCOPY  2012   Tics  . CORONARY ANGIOPLASTY WITH STENT PLACEMENT  04/01/2014   "2"  . ESOPHAGEAL DILATION  2003  . ESOPHAGOGASTRODUODENOSCOPY  2003   ERD, stricture dilated  . EXCISIONAL HEMORRHOIDECTOMY  ~ 2008  . LEFT HEART CATHETERIZATION WITH CORONARY ANGIOGRAM N/A 03/26/2014   Procedure: LEFT HEART CATHETERIZATION WITH CORONARY ANGIOGRAM;  Surgeon:  Peter M Martinique, MD;  Location[facility] CATH LAB;  Service: Cardiovascular;  Laterality: N/A;  . PERCUTANEOUS CORONARY STENT INTERVENTION (PCI-S) N/A 04/01/2014   Procedure: PERCUTANEOUS CORONARY STENT INTERVENTION (PCI-S);  Surgeon: Peter M Martinique, MD;  Location: [facility] CATH LAB;  Service: Cardiovascular;  Laterality: N/A;  . POSTERIOR LAMINECTOMY / DECOMPRESSION LUMBAR SPINE  12/2009   L4 , Dr Sherwood Gambler  . SHOULDER ARTHROSCOPY WITH ROTATOR CUFF REPAIR AND OPEN BICEPS TENODESIS Left 02/04/2019   Procedure: LEFT SHOULDER ARTHROSCOPY, BICEPS TENODESIS, MINI OPEN ROTATOR CUFF TEAR REPAIR;  Surgeon: Meredith Pel, MD;  Location: [place];  Service: Orthopedics;  Laterality: Left;    Social History   Tobacco Use  Smoking Status Never Smoker  Smokeless Tobacco Never Used    Social History   Substance and Sexual Activity  Alcohol Use Yes  . Alcohol/week: 2.0 standard drinks  . Types: 2 Cans of beer per week   Comment: social    Family History  Problem Relation Age of Onset  . Heart attack Mother 24  . Stroke Mother 55  . Prostate cancer Father   . Diabetes Father   . Heart disease Father 53       CBAG   . Diabetes Brother   . Heart attack Brother   . Breast cancer Maternal Aunt   . Heart attack Maternal Grandfather 61  . Diabetes Paternal Grandmother   . Stroke Paternal Grandfather 56  . Heart disease Sister     Review of Systems: As noted in HPI. All other systems were reviewed and are negative.  Physical Exam: BP 120/70 (BP Location: Left Arm, Patient Position: Sitting)   Pulse 60   Ht 5\' 9"  (1.753 m)   Wt 194 lb (88 kg)   SpO2 97%   BMI 28.65 kg/m   GENERAL:  Well appearing WM in NAD HEENT:  PERRL, EOMI, sclera are clear. Oropharynx is clear. NECK:  No jugular venous distention, carotid upstroke brisk and symmetric, no bruits, no thyromegaly or adenopathy LUNGS:  Clear to auscultation bilaterally CHEST:  Unremarkable HEART:  RRR,  PMI not displaced or  sustained,S1 and S2 within normal limits, no S3, no S4: no clicks, no rubs, no murmurs ABD:  Soft, nontender. BS +, no masses or bruits. No hepatomegaly, no splenomegaly EXT:  2 + pulses throughout, no edema, no cyanosis no clubbing SKIN:  Warm and dry.  No rashes NEURO:  Alert and oriented x 3. Cranial nerves II through XII intact. PSYCH:  Cognitively intact      LABORATORY DATA: Lab Results  Component Value Date   WBC 8.2 09/02/2020   HGB 14.4 09/02/2020   HCT 41.9 09/02/2020   PLT 205.0 09/02/2020   GLUCOSE 129 (H) 09/02/2020   CHOL 110 09/02/2020   TRIG 100.0 09/02/2020  HDL 35.80 (L) 09/02/2020   LDLCALC 54 09/02/2020   ALT 21 09/02/2020   AST 14 09/02/2020   NA 139 09/02/2020   K 4.3 09/02/2020   CL 103 09/02/2020   CREATININE 0.98 09/02/2020   BUN 15 09/02/2020   CO2 29 09/02/2020   TSH 0.96 09/02/2020   PSA 0.9 03/05/2020   INR 1.0 03/19/2014   HGBA1C 7.4 (H) 09/02/2020   MICROALBUR 0.7 01/17/2017   Ecg today shows NSR with normal Ecg. Rate 60. I have personally reviewed and interpreted this study.   Assessment / Plan: 1. Coronary disease s/p DES stenting of the LAD and first diagonal in October 2015. Normal FFR of OM2. He is asymptomatic but didn't have a lot of symptoms prior to stents. He is diabetic and may need TKR. Will arrange for a Lexiscan Myoview.   Will continue Plavix long term since he is ASA allergic.  Continue risk factor modification.     2. Hyperlipidemia.  On high dose lipitor. Lipids at goal with LDL 54.   3. Diabetes mellitus type 2. On metformin. Followed by primary care.  A1c 7.4%.  He is really going to work on his diet and weight control. If no better may need additional therapy  4. Hypertension-controlled.  5. History of Contrast dye allergy with hives- despite pretreatment.

## 2020-09-18 ENCOUNTER — Encounter: Payer: Self-pay | Admitting: Cardiology

## 2020-09-18 ENCOUNTER — Other Ambulatory Visit: Payer: Self-pay

## 2020-09-18 ENCOUNTER — Ambulatory Visit (INDEPENDENT_AMBULATORY_CARE_PROVIDER_SITE_OTHER): Payer: Medicare Other | Admitting: Cardiology

## 2020-09-18 ENCOUNTER — Other Ambulatory Visit: Payer: Self-pay | Admitting: Family Medicine

## 2020-09-18 VITALS — BP 120/70 | HR 60 | Ht 69.0 in | Wt 194.0 lb

## 2020-09-18 DIAGNOSIS — I25118 Atherosclerotic heart disease of native coronary artery with other forms of angina pectoris: Secondary | ICD-10-CM | POA: Diagnosis not present

## 2020-09-18 DIAGNOSIS — E7800 Pure hypercholesterolemia, unspecified: Secondary | ICD-10-CM

## 2020-09-18 DIAGNOSIS — I10 Essential (primary) hypertension: Secondary | ICD-10-CM | POA: Diagnosis not present

## 2020-09-18 NOTE — Patient Instructions (Signed)
We will schedule you for a nuclear stress test   

## 2020-09-22 ENCOUNTER — Telehealth (HOSPITAL_COMMUNITY): Payer: Self-pay | Admitting: *Deleted

## 2020-09-22 ENCOUNTER — Telehealth: Payer: Self-pay

## 2020-09-22 NOTE — Telephone Encounter (Signed)
Close encounter 

## 2020-09-22 NOTE — Chronic Care Management (AMB) (Signed)
    Chronic Care Management Pharmacy Assistant   Name: [name] [name]  MRN: [id number] DOB: 1951-05-04  Reason for Encounter: Chart Review   Medications: Outpatient Encounter Medications as of 09/22/2020  Medication Sig  . atorvastatin (LIPITOR) 80 MG tablet TAKE 1 TABLET DAILY AT 6PM  . benzonatate (TESSALON PERLES) 100 MG capsule Take 1 capsule (100 mg total) by mouth 3 (three) times daily as needed.  . carvedilol (COREG) 3.125 MG tablet TAKE 1 TABLET TWICE DAILY  WITH MEALS.  Marland Kitchen clopidogrel (PLAVIX) 75 MG tablet TAKE 1 TABLET DAILY  . Coenzyme Q10 (CO Q 10 PO) Take 200 mg by mouth daily.   . Cyanocobalamin (VITAMIN B 12 PO) Take 1 tablet by mouth daily.  Marland Kitchen glucose blood (ONE TOUCH ULTRA TEST) test strip Use one strip per test. Test blood sugars 1-4 times daily as instructed.  . irbesartan (AVAPRO) 300 MG tablet TAKE 1 TABLET DAILY.  . metFORMIN (GLUCOPHAGE-XR) 500 MG 24 hr tablet Take 2 tablet (1000Mg  total) by mouth daily after supper  . Misc Natural Products (GLUCOSAMINE CHOND COMPLEX/MSM) TABS   . Multiple Vitamins-Minerals (MULTIVITAMIN WITH MINERALS) tablet Take 1 tablet by mouth daily.  . nitroGLYCERIN (NITROSTAT) 0.4 MG SL tablet Place 1 tablet (0.4 mg total) under the tongue every 5 (five) minutes as needed for chest pain.  . sildenafil (REVATIO) 20 MG tablet Take 1-5 tablets by mouth daily as needed.  Marland Kitchen spironolactone (ALDACTONE) 25 MG tablet TAKE 1 TABLET DAILY  . vitamin B-12 (CYANOCOBALAMIN) 1000 MCG tablet Take 1,000 mcg by mouth daily.   No facility-administered encounter medications on file as of 09/22/2020.   Reviewed chart for medication changes.  09/02/2020 OV PCP [name], MD;  He is on Metformin 1000 mg daily.  He is having some diarrhea that is currently manageable.  He will let me know if this continues to be an issue and we may consider switching to Ozempic or another medication.  09/18/2020 OV (cardiology) [name], MD; He is diabetic and may need TKR.  Will arrange for a Lexiscan Myoview.   Will continue Plavix long term since he is ASA allergic  No medication changes indicated.   [name], [place] Pharmacist Assistant [phone]

## 2020-09-23 ENCOUNTER — Other Ambulatory Visit: Payer: Self-pay

## 2020-09-23 ENCOUNTER — Ambulatory Visit (HOSPITAL_COMMUNITY)
Admission: RE | Admit: 2020-09-23 | Discharge: 2020-09-23 | Disposition: A | Discharge status: Home / Self Care | Payer: Medicare Other | Source: Ambulatory Visit | Attending: Cardiovascular Disease | Admitting: Cardiovascular Disease

## 2020-09-23 DIAGNOSIS — I25118 Atherosclerotic heart disease of native coronary artery with other forms of angina pectoris: Secondary | ICD-10-CM | POA: Diagnosis not present

## 2020-09-23 DIAGNOSIS — I10 Essential (primary) hypertension: Secondary | ICD-10-CM | POA: Insufficient documentation

## 2020-09-23 LAB — MYOCARDIAL PERFUSION IMAGING
LV dias vol: 111 mL (ref 62–150)
LV sys vol: 57 mL
Peak HR: 71 {beats}/min
Rest HR: 49 {beats}/min
SDS: 1
SRS: 5
SSS: 6
TID: 1.01

## 2020-09-23 MED ORDER — TECHNETIUM TC 99M TETROFOSMIN IV KIT
10.8000 | PACK | Freq: Once | INTRAVENOUS | Status: AC | PRN
  Administered 2020-09-23: 10.8 via INTRAVENOUS
  Filled 2020-09-23: qty 11

## 2020-09-23 MED ORDER — REGADENOSON 0.4 MG/5ML IV SOLN
0.4000 mg | Freq: Once | INTRAVENOUS | Status: AC
  Administered 2020-09-23: 0.4 mg via INTRAVENOUS

## 2020-09-23 MED ORDER — TECHNETIUM TC 99M TETROFOSMIN IV KIT
30.8000 | PACK | Freq: Once | INTRAVENOUS | Status: AC | PRN
  Administered 2020-09-23: 30.8 via INTRAVENOUS
  Filled 2020-09-23: qty 31

## 2020-10-12 DIAGNOSIS — Z20822 Contact with and (suspected) exposure to covid-19: Secondary | ICD-10-CM | POA: Diagnosis not present

## 2020-11-04 ENCOUNTER — Telehealth: Payer: Medicare Other

## 2020-11-24 ENCOUNTER — Telehealth: Payer: Medicare Other

## 2020-12-01 ENCOUNTER — Ambulatory Visit (INDEPENDENT_AMBULATORY_CARE_PROVIDER_SITE_OTHER): Payer: Medicare Other

## 2020-12-01 DIAGNOSIS — E1165 Type 2 diabetes mellitus with hyperglycemia: Secondary | ICD-10-CM

## 2020-12-01 DIAGNOSIS — E785 Hyperlipidemia, unspecified: Secondary | ICD-10-CM

## 2020-12-01 DIAGNOSIS — I152 Hypertension secondary to endocrine disorders: Secondary | ICD-10-CM

## 2020-12-01 DIAGNOSIS — E1159 Type 2 diabetes mellitus with other circulatory complications: Secondary | ICD-10-CM | POA: Diagnosis not present

## 2020-12-01 DIAGNOSIS — E1169 Type 2 diabetes mellitus with other specified complication: Secondary | ICD-10-CM

## 2020-12-01 DIAGNOSIS — M1711 Unilateral primary osteoarthritis, right knee: Secondary | ICD-10-CM | POA: Diagnosis not present

## 2020-12-01 NOTE — Progress Notes (Signed)
Chronic Care Management Pharmacy Note  12/01/2020 Name:  [name] [name] MRN:  [id number] DOB:  09-18-1950  Recommendations/Changes made from today's visit: no RX changes ______________________ Subjective: [name] [name] is an 70 y.o. year old male who is a primary patient of [name], Algis Greenhouse, MD.  The CCM team was consulted for assistance with disease management and care coordination needs.    Engaged with patient by telephone for follow up visit in response to provider referral for pharmacy case management and/or care coordination services.   Consent to Services:  The patient was given information about Chronic Care Management services, agreed to services, and gave verbal consent prior to initiation of services.  Please see initial visit note for detailed documentation.   Patient Care Team: Vivi Barrack, MD as PCP - General (Family Medicine) Martinique, Peter M, MD as PCP - Cardiology (Cardiology) Calton Dach, MD as Consulting Physician (Optometry) Marlou Sa, Tonna Corner, MD as Consulting Physician (Orthopedic Surgery) [facility] as Consulting Physician Madelin Rear,  [facility] as Pharmacist (Pharmacist)  Hospital visits: None in previous 6 months  Objective:  Lab Results  Component Value Date   CREATININE 0.98 09/02/2020   CREATININE 0.96 03/05/2020   CREATININE 1.07 10/21/2019    Lab Results  Component Value Date   HGBA1C 7.4 (H) 09/02/2020   Last diabetic Eye exam:  Lab Results  Component Value Date/Time   HMDIABEYEEXA No Retinopathy 04/27/2020 01:13 PM    Last diabetic Foot exam: No results found for: HMDIABFOOTEX      Component Value Date/Time   CHOL 110 09/02/2020 1010   CHOL 132 08/18/2016 0814   TRIG 100.0 09/02/2020 1010   TRIG 82 04/11/2006 0804   HDL 35.80 (L) 09/02/2020 1010   HDL 41 08/18/2016 0814   CHOLHDL 3 09/02/2020 1010   VLDL 20.0 09/02/2020 1010   LDLCALC 54 09/02/2020 1010   LDLCALC 68 03/05/2020 1009    Hepatic  Function Latest Ref Rng & Units 09/02/2020 03/05/2020 10/21/2019  Total Protein 6.0 - 8.3 g/dL 6.4 6.2 7.6  Albumin 3.5 - 5.2 g/dL 4.0 - 4.5  AST 0 - 37 U/L '14 16 20  ' ALT 0 - 53 U/L '21 22 23  ' Alk Phosphatase 39 - 117 U/L 82 - 85  Total Bilirubin 0.2 - 1.2 mg/dL 0.6 0.8 0.7  Bilirubin, Direct 0.00 - 0.40 mg/dL - - -    Lab Results  Component Value Date/Time   TSH 0.96 09/02/2020 10:10 AM   TSH 0.63 03/05/2020 10:09 AM    CBC Latest Ref Rng & Units 09/02/2020 03/05/2020 10/21/2019  WBC 4.0 - 10.5 K/uL 8.2 7.4 13.5(H)  Hemoglobin 13.0 - 17.0 g/dL 14.4 14.2 16.3  Hematocrit 39.0 - 52.0 % 41.9 42.1 47.9  Platelets 150.0 - 400.0 K/uL 205.0 243 239    No results found for: VD25OH  Clinical ASCVD:  The ASCVD Risk score Mikey Bussing DC Jr., et al., 2013) failed to calculate for the following reasons:   The valid total cholesterol range is 130 to 320 mg/dL    Other: (CHADS2VASc if Afib, PHQ9 if depression, MMRC or CAT for COPD, ACT, DEXA)  Social History   Tobacco Use  Smoking Status Never Smoker  Smokeless Tobacco Never Used   BP Readings from Last 3 Encounters:  09/18/20 120/70  09/02/20 123/78  03/05/20 (!) 149/78   Pulse Readings from Last 3 Encounters:  09/18/20 60  09/02/20 (!) 51  03/05/20 (!) 53   Wt Readings from  Last 3 Encounters:  09/23/20 194 lb (88 kg)  09/18/20 194 lb (88 kg)  09/02/20 191 lb 9.6 oz (86.9 kg)    Assessment: Review of patient past medical history, allergies, medications, health status, including review of consultants reports, laboratory and other test data, was performed as part of comprehensive evaluation and provision of chronic care management services.   SDOH:  (Social Determinants of Health) assessments and interventions performed:    CCM Care Plan  Allergies  Allergen Reactions  . Aspirin Other (See Comments)    Angioedema  . Other Hives  . Oxycodone     nausea  . Contrast Media [Iodinated Diagnostic Agents] Itching and Rash    Developed  hives despite pre treatment  . 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 Sig Documenting Provider Last Dose Status Informant  atorvastatin (LIPITOR) 80 MG tablet [id number] Yes TAKE 1 TABLET DAILY AT Jarold Song, Algis Greenhouse, MD Taking Active   benzonatate (TESSALON PERLES) 100 MG capsule [id number] Yes Take 1 capsule (100 mg total) by mouth 3 (three) times daily as needed. Lucretia Kern, DO Taking Active            Med Note Orma Render   Fri Sep 18, 2020  3:40 PM)    carvedilol (COREG) 3.125 MG tablet [id number] Yes TAKE 1 TABLET TWICE DAILY  WITH MEALS. Vivi Barrack, MD Taking Active   clopidogrel (PLAVIX) 75 MG tablet [id number] Yes TAKE 1 TABLET DAILY Vivi Barrack, MD Taking Active   Coenzyme Q10 (CO Q 10 PO) [id number] Yes Take 200 mg by mouth daily.  [provider] Taking Active Self  Cyanocobalamin (VITAMIN B 12 PO) [id number]  Take 1 tablet by mouth daily. [provider]  Consider Medication Status and Discontinue (Duplicate) Self  glucose blood (ONE TOUCH ULTRA TEST) test strip [id number] Yes Use one strip per test. Test blood sugars 1-4 times daily as instructed. Golden Circle, FNP Taking Active   irbesartan (AVAPRO) 300 MG tablet [id number] Yes TAKE 1 TABLET DAILY. Vivi Barrack, MD Taking Active   metFORMIN (GLUCOPHAGE-XR) 500 MG 24 hr tablet [id number] Yes Take 2 tablet (1000Mg total) by mouth daily after supper Vivi Barrack, MD Taking Active   Misc Natural Products (GLUCOSAMINE CHOND COMPLEX/MSM) Sheral Flow [id number] Yes  [provider] Taking Active   Multiple Vitamins-Minerals (MULTIVITAMIN WITH MINERALS) tablet [id number] Yes Take 1 tablet by mouth daily. [provider] Taking Active Self  nitroGLYCERIN (NITROSTAT) 0.4 MG SL tablet  [id number] Yes Place 1 tablet (0.4 mg total) under the tongue every 5 (five) minutes as needed for chest pain. Martinique, Peter M, MD Taking Active            Med Note Netta Neat, Hermina Staggers   Fri Sep 18, 2020  3:42 PM)    sildenafil (REVATIO) 20 MG tablet [id number] Yes Take 1-5 tablets by mouth daily as needed. Vivi Barrack, MD Taking Active   spironolactone (ALDACTONE) 25 MG tablet [id number] Yes TAKE 1 TABLET DAILY Vivi Barrack, MD Taking Active   vitamin B-12 (CYANOCOBALAMIN) 1000 MCG tablet [id number] Yes Take 1,000 mcg by mouth daily. [provider] Taking Active           Patient Active Problem List   Diagnosis  Date Noted  . Nephrolithiasis 03/05/2020  . Labral tear of long head of biceps tendon, left, sequela   . Complete tear of left rotator cuff   . Diverticulosis of colon (without mention of hemorrhage) 04/25/2011  . Adenomatous colon polyp 03/17/2011  . LOW BACK PAIN, CHRONIC 12/13/2007  . Hyperlipidemia associated with type 2 diabetes mellitus ([place]) 06/08/2007  . T2DM (type 2 diabetes mellitus) ([place]) 02/27/2007  . Hypertension associated with diabetes ([place]) 02/27/2007  . AMBLYOPIA 12/06/2006  . GERD 12/06/2006    Immunization History  Administered Date(s) Administered  . Fluad Quad(high Dose 65+) 02/21/2019, 04/07/2020  . Influenza,inj,Quad PF,6+ Mos 03/19/2014  . PFIZER(Purple Top)SARS-COV-2 Vaccination 08/01/2019, 08/27/2019, 03/25/2020  . Pneumococcal Conjugate-13 01/17/2017  . Pneumococcal Polysaccharide-23 01/31/2018  . Td 12/18/2008  . Tdap 04/07/2020  . Zoster, Live 12/24/2012    Conditions to be addressed/monitored: HLD HTN DMII OA   Care Plan : [place]  Updates made by Madelin Rear, [facility] since 12/01/2020 12:00 AM    Problem: HLD HTN DMII OA   Priority: High    Long-Range Goal: Disease Management   Start Date: 12/01/2020  Expected End Date: 12/01/2021  This Visit's Progress: On track  Priority: High  Note:   Interventions: . 1:1  collaboration with Vivi Barrack, MD regarding development and update of comprehensive plan of care as evidenced by provider attestation and co-signature . Inter-disciplinary care team collaboration (see longitudinal plan of care) . Comprehensive medication review performed; medication list updated in electronic medical record . No Rx changes  Hypertension (BP goal <140/90) -Controlled -Current treatment: . Irbesartan 300 mg once daily . Carvedilol 3.125 mg twice daily . Spironolactone 25 mg once daily -Current home readings: at goal -Current dietary habits: some processed foods and carbs - rice, bagels -Current exercise habits: limited due to knee pain - is strongly considering knee replacement -Denies hypotensive/hypertensive symptoms -Educated on Symptoms of hypotension and importance of maintaining adequate hydration; -Counseled to monitor BP at home 1-2x/month, document, and provide log at future appointments -Counseled on diet and exercise extensively Recommended to continue current medication  Hyperlipidemia: (LDL goal < 70) -Controlled -Current treatment: . Atorvastatin 80 mg once daily -Educated on Cholesterol goals;  -Recommended to continue current medication  Diabetes (A1c goal <7%) -Not ideally controlled -Current medications: Marland Kitchen Metformin 500 mg XR - two tablets once daily -Medications previously tried: IR (did not tolerate) -Current home glucose readings . fasting glucose: not testing . post prandial glucose: not testing -Educated on A1c and blood sugar goals; -Reviewed side effects - tolerating XR preparation much better, now is able to take consistently due to not needing to bring meds to work with him -Counseled to check feet daily and get yearly eye exams -Counseled on diet and exercise extensively Recommended to continue current medication      Current Barriers:  . Unable to maintain control of DM - goal to optimize lifestyle (diet/exercise)  choices  Pharmacist Clinical Goal(s):  Marland Kitchen Patient will contact provider office for questions/concerns as evidenced notation of same in electronic health record through collaboration with PharmD and provider.   Patient Goals/Self-Care Activities . Patient will:  - take medications as prescribed target a minimum of 150 minutes of moderate intensity exercise weekly  Follow Up Plan: [place] f/u call 4 months Medication Assistance: None required.  Patient affirms current coverage meets needs.  Patient's preferred pharmacy is:  [facility] [address], [place]  [place] [address] Phone: [phone] Fax: [phone]  [facility], [place] to [address] [place] [address] Phone: [phone] Fax: [place], [place] [address] [place] [address] Phone: [phone] Fax: [phone]  Follow Up:  Patient agrees to Care Plan and Follow-up.  Future Appointments  Date Time Provider [place]  03/08/2021 10:00 AM Vivi Barrack, MD [facility]  03/18/2021 10:00 AM [facility] HEALTH COACH [facility]  03/30/2021  1:00 PM [facility] CCM PHARMACIST [facility]   Madelin Rear, PharmD, CPP Clinical Pharmacist Practitioner  [facility]  [phone]

## 2020-12-10 ENCOUNTER — Other Ambulatory Visit: Payer: Self-pay | Admitting: Family Medicine

## 2020-12-29 DIAGNOSIS — M1711 Unilateral primary osteoarthritis, right knee: Secondary | ICD-10-CM | POA: Diagnosis not present

## 2021-01-19 DIAGNOSIS — M25561 Pain in right knee: Secondary | ICD-10-CM | POA: Diagnosis not present

## 2021-01-21 DIAGNOSIS — L718 Other rosacea: Secondary | ICD-10-CM | POA: Diagnosis not present

## 2021-01-21 DIAGNOSIS — L918 Other hypertrophic disorders of the skin: Secondary | ICD-10-CM | POA: Diagnosis not present

## 2021-01-21 DIAGNOSIS — L821 Other seborrheic keratosis: Secondary | ICD-10-CM | POA: Diagnosis not present

## 2021-02-01 ENCOUNTER — Telehealth: Payer: Self-pay | Admitting: Pharmacist

## 2021-02-01 NOTE — Chronic Care Management (AMB) (Addendum)
    Chronic Care Management Pharmacy Assistant   Name: [name] [name]  MRN: [id number] DOB: 02/18/1951  Reason for Encounter: Patient Call     Medications: Outpatient Encounter Medications as of 02/01/2021  Medication Sig   atorvastatin (LIPITOR) 80 MG tablet TAKE 1 TABLET DAILY AT     6:00PM   carvedilol (COREG) 3.125 MG tablet TAKE 1 TABLET TWICE DAILY  WITH MEALS.   clopidogrel (PLAVIX) 75 MG tablet TAKE 1 TABLET DAILY   Coenzyme Q10 (CO Q 10 PO) Take 200 mg by mouth daily.    Cyanocobalamin (VITAMIN B 12 PO) Take 1 tablet by mouth daily.   glucose blood (ONE TOUCH ULTRA TEST) test strip Use one strip per test. Test blood sugars 1-4 times daily as instructed.   irbesartan (AVAPRO) 300 MG tablet TAKE 1 TABLET DAILY.   metFORMIN (GLUCOPHAGE-XR) 500 MG 24 hr tablet Take 2 tablet ('1000Mg'$  total) by mouth daily after supper   Misc Natural Products (GLUCOSAMINE CHOND COMPLEX/MSM) TABS    Multiple Vitamins-Minerals (MULTIVITAMIN WITH MINERALS) tablet Take 1 tablet by mouth daily.   nitroGLYCERIN (NITROSTAT) 0.4 MG SL tablet Place 1 tablet (0.4 mg total) under the tongue every 5 (five) minutes as needed for chest pain.   sildenafil (REVATIO) 20 MG tablet Take 1-5 tablets by mouth daily as needed.   spironolactone (ALDACTONE) 25 MG tablet TAKE 1 TABLET DAILY   vitamin B-12 (CYANOCOBALAMIN) 1000 MCG tablet Take 1,000 mcg by mouth daily.   No facility-administered encounter medications on file as of 02/01/2021.   Patient called stating he was given a supplement NMN Pro 250. He wanted to know if the pharmacist knew anything about this supplement and if it is safe to take?  [name], [place] Pharmacist Assistant [phone]  10 minutes spent in review, coordination, and documentation. There have not been any medical studies or anything to support claims of this type of product.  However, it should be a safe product and not interact with any other meds.  If he wants to give it a shot  I think that is fine.  Reviewed by: Beverly Milch, PharmD Clinical Pharmacist [phone]

## 2021-03-03 DIAGNOSIS — M1711 Unilateral primary osteoarthritis, right knee: Secondary | ICD-10-CM | POA: Diagnosis not present

## 2021-03-08 ENCOUNTER — Other Ambulatory Visit: Payer: Self-pay

## 2021-03-08 ENCOUNTER — Encounter: Payer: Self-pay | Admitting: Family Medicine

## 2021-03-08 ENCOUNTER — Ambulatory Visit (INDEPENDENT_AMBULATORY_CARE_PROVIDER_SITE_OTHER): Payer: Medicare Other | Admitting: Family Medicine

## 2021-03-08 VITALS — BP 144/80 | HR 56 | Temp 98.2°F | Ht 69.0 in | Wt 185.0 lb

## 2021-03-08 DIAGNOSIS — E785 Hyperlipidemia, unspecified: Secondary | ICD-10-CM

## 2021-03-08 DIAGNOSIS — Z23 Encounter for immunization: Secondary | ICD-10-CM

## 2021-03-08 DIAGNOSIS — I152 Hypertension secondary to endocrine disorders: Secondary | ICD-10-CM

## 2021-03-08 DIAGNOSIS — E1169 Type 2 diabetes mellitus with other specified complication: Secondary | ICD-10-CM

## 2021-03-08 DIAGNOSIS — E1165 Type 2 diabetes mellitus with hyperglycemia: Secondary | ICD-10-CM | POA: Diagnosis not present

## 2021-03-08 DIAGNOSIS — E1159 Type 2 diabetes mellitus with other circulatory complications: Secondary | ICD-10-CM | POA: Diagnosis not present

## 2021-03-08 LAB — POCT GLYCOSYLATED HEMOGLOBIN (HGB A1C): Hemoglobin A1C: 7 % — AB (ref 4.0–5.6)

## 2021-03-08 NOTE — Assessment & Plan Note (Signed)
Above goal today.  Typically well controlled.  Continue Coreg 3.125 mg twice daily, irbesartan 300 mg daily, and spironolactone 25 mg daily.  Continue home monitoring.

## 2021-03-08 NOTE — Progress Notes (Signed)
   [name] [name] is a 70 y.o. male who presents today for an office visit.  Assessment/Plan:  Chronic Problems Addressed Today: Hypertension associated with diabetes ([place]) Above goal today.  Typically well controlled.  Continue Coreg 3.125 mg twice daily, irbesartan 300 mg daily, and spironolactone 25 mg daily.  Continue home monitoring.  Hyperlipidemia associated with type 2 diabetes mellitus (HCC) On Lipitor 80 mg daily.  We will check lipids in 6 months.  T2DM (type 2 diabetes mellitus) (HCC) A1c stable 7.0.  Continue metformin 1000 mg daily.  Recheck 6 months.  Flu vaccine given today.     Subjective:  HPI:  He has no acute problems today.  He has been cutting back on sugar intake, and has been trying a diet where he eats 30g protein with each meal.  Also, he states that he is going to have a knee surgery. He denies measuring blood sugar, and sometimes measures blood pressure at home. Moreover he has been forgetting to take his second dose each day of carvedilol.        Objective:  Physical Exam: BP (!) 151/85   Pulse (!) 56   Temp 98.2 F (36.8 C)   Ht '5\' 9"'$  (1.753 m)   Wt 185 lb (83.9 kg)   BMI 27.32 kg/m   Gen: No acute distress, resting comfortably CV: Regular rate and rhythm with no murmurs appreciated Pulm: Normal work of breathing, clear to auscultation bilaterally with no crackles, wheezes, or rhonchi Neuro: Grossly normal, moves all extremities Psych: Normal affect and thought content      I,Jordan Kelly,acting as a scribe for [name], MD.,have documented all relevant documentation on the behalf of [name], MD,as directed by  [name], MD while in the presence of [name], MD.  I, [name], MD, have reviewed all documentation for this visit. The documentation on 03/08/21 for the exam, diagnosis, procedures, and orders are all accurate and complete.  Algis Greenhouse. [name], MD 03/08/2021 10:43 AM

## 2021-03-08 NOTE — Assessment & Plan Note (Signed)
On Lipitor 80 mg daily.  We will check lipids in 6 months.

## 2021-03-08 NOTE — Assessment & Plan Note (Signed)
A1c stable 7.0.  Continue metformin 1000 mg daily.  Recheck 6 months.

## 2021-03-08 NOTE — Patient Instructions (Signed)
It was very nice to see you today!  Your A1c is 7.0.  Please keep an eye on your blood pressure at home and let me know if it is persistently 150/90 or higher.  We will see you back in 6 months.  Please come back to see me sooner if needed.  Take care, Dr [name]  PLEASE NOTE:  If you had any lab tests please let us know if you have not heard back within a few days. You may see your results on mychart before we have a chance to review them but we will give you a call once they are reviewed by [place]. If we ordered any referrals today, please let us know if you have not heard from their office within the next week.   Please try these tips to maintain a healthy lifestyle:  Eat at least 3 REAL meals and 1-2 snacks per day.  Aim for no more than 5 hours between eating.  If you eat breakfast, please do so within one hour of getting up.   Each meal should contain half fruits/vegetables, one quarter protein, and one quarter carbs (no bigger than a computer mouse)  Cut down on sweet beverages. This includes juice, soda, and sweet tea.   Drink at least 1 glass of water with each meal and aim for at least 8 glasses per day  Exercise at least 150 minutes every week.

## 2021-03-10 ENCOUNTER — Telehealth: Payer: Self-pay | Admitting: Pharmacist

## 2021-03-10 ENCOUNTER — Other Ambulatory Visit: Payer: Self-pay | Admitting: Family Medicine

## 2021-03-10 NOTE — Chronic Care Management (AMB) (Addendum)
Chronic Care Management Pharmacy Assistant   Name: [name] [name]  MRN: [id number] DOB: [birth year]   Reason for Encounter: Diabetes Adherence Call    Recent office visits:  03/08/2021 [facility] (PCP) Vivi Barrack, MD; chronic follow up, no medication changes indicated.  Recent consult visits:  None  Hospital visits:  None in previous 6 months  Medications: Outpatient Encounter Medications as of 03/10/2021  Medication Sig   atorvastatin (LIPITOR) 80 MG tablet TAKE 1 TABLET DAILY AT     6:00PM   carvedilol (COREG) 3.125 MG tablet TAKE 1 TABLET TWICE DAILY  WITH MEALS.   clopidogrel (PLAVIX) 75 MG tablet TAKE 1 TABLET DAILY   Coenzyme Q10 (CO Q 10 PO) Take 200 mg by mouth daily.    Cyanocobalamin (VITAMIN B 12 PO) Take 1 tablet by mouth daily.   glucose blood (ONE TOUCH ULTRA TEST) test strip Use one strip per test. Test blood sugars 1-4 times daily as instructed.   irbesartan (AVAPRO) 300 MG tablet TAKE 1 TABLET DAILY.   metFORMIN (GLUCOPHAGE-XR) 500 MG 24 hr tablet Take 2 tablet ('1000Mg'$  total) by mouth daily after supper   Misc Natural Products (GLUCOSAMINE CHOND COMPLEX/MSM) TABS    Multiple Vitamins-Minerals (MULTIVITAMIN WITH MINERALS) tablet Take 1 tablet by mouth daily.   nitroGLYCERIN (NITROSTAT) 0.4 MG SL tablet Place 1 tablet (0.4 mg total) under the tongue every 5 (five) minutes as needed for chest pain.   sildenafil (REVATIO) 20 MG tablet Take 1-5 tablets by mouth daily as needed.   spironolactone (ALDACTONE) 25 MG tablet TAKE 1 TABLET DAILY   vitamin B-12 (CYANOCOBALAMIN) 1000 MCG tablet Take 1,000 mcg by mouth daily.   No facility-administered encounter medications on file as of 03/10/2021.    Recent Relevant Labs: Lab Results  Component Value Date/Time   HGBA1C 7.0 (A) 03/08/2021 10:19 AM   HGBA1C 7.4 (H) 09/02/2020 10:10 AM   HGBA1C 7.5 (H) 03/05/2020 10:09 AM   MICROALBUR 0.7 01/17/2017 10:59 AM   MICROALBUR 0.8 03/19/2014 08:51 AM    Kidney Function Lab  Results  Component Value Date/Time   CREATININE 0.98 09/02/2020 10:10 AM   CREATININE 0.96 03/05/2020 10:09 AM   CREATININE 1.07 10/21/2019 01:11 AM   CREATININE 1.00 07/09/2015 09:57 AM   GFR 78.07 09/02/2020 10:10 AM   GFRNONAA >60 10/21/2019 01:11 AM   GFRAA >60 10/21/2019 01:11 AM    Current antihyperglycemic regimen:  Metformin 1000 mg total after supper  What recent interventions/DTPs have been made to improve glycemic control:  No recent interventions or DTPs.  Have there been any recent hospitalizations or ED visits since last visit with CPP? No  Patient denies hypoglycemic symptoms.  Patient denies hyperglycemic symptoms.  How often are you checking your blood sugar? Infrequently  What are your blood sugars ranging?  Fasting: n/a Before meals: n/a After meals: n/a Bedtime: n/a  During the week, how often does your blood glucose drop below 70? Never  Are you checking your feet daily/regularly? Patient states he checks his feet regularly.  Adherence Review: Is the patient currently on a STATIN medication? Yes Is the patient currently on ACE/ARB medication? Yes Does the patient have >5 day gap between last estimated fill dates? No   125/70 - Patients most recent home blood pressure reading.  Care Gaps: Medicare Annual Wellness: last AWV 03/08/2020 - has AWV scheduled Ophthalmology Exam: Next due on 04/27/2021 Foot Exam: Overdue since 02/21/2020 PNA Vaccination: Completed Hemoglobin A1C: 03/08/21 (7.0) Colonoscopy: Postponed until 03/27/2021  last done 04/25/2011  Future Appointments  Date Time Provider [place]  03/18/2021 10:00 AM [facility] HEALTH COACH [facility]  03/30/2021  1:00 PM [facility] CCM PHARMACIST [facility]  09/08/2021  9:20 AM Vivi Barrack, MD [facility]     Star Rating Drugs: Atorvastatin last filled 12/16/2020 90 DS Irbesartan 300 mg last filled 12/16/2020 90 DS Metformin 500 mg last filled 01/12/2021 90 DS  [name],  [place] Pharmacist Assistant [phone]   9 minutes spent in review, coordination, and documentation.  Reviewed by: Beverly Milch, PharmD Clinical Pharmacist [phone]

## 2021-03-12 DIAGNOSIS — Z23 Encounter for immunization: Secondary | ICD-10-CM | POA: Diagnosis not present

## 2021-03-18 ENCOUNTER — Ambulatory Visit (INDEPENDENT_AMBULATORY_CARE_PROVIDER_SITE_OTHER): Payer: Medicare Other

## 2021-03-18 DIAGNOSIS — Z0000 Encounter for general adult medical examination without abnormal findings: Secondary | ICD-10-CM

## 2021-03-18 NOTE — Patient Instructions (Signed)
Health Maintenance, Male Adopting a healthy lifestyle and getting preventive care are important in promoting health and wellness. Ask your health care provider about: The right schedule for you to have regular tests and exams. Things you can do on your own to prevent diseases and keep yourself healthy. What should I know about diet, weight, and exercise? Eat a healthy diet  Eat a diet that includes plenty of vegetables, fruits, low-fat dairy products, and lean protein. Do not eat a lot of foods that are high in solid fats, added sugars, or sodium. Maintain a healthy weight Body mass index (BMI) is a measurement that can be used to identify possible weight problems. It estimates body fat based on height and weight. Your health care provider can help determine your BMI and help you achieve or maintain a healthy weight. Get regular exercise Get regular exercise. This is one of the most important things you can do for your health. Most adults should: Exercise for at least 150 minutes each week. The exercise should increase your heart rate and make you sweat (moderate-intensity exercise). Do strengthening exercises at least twice a week. This is in addition to the moderate-intensity exercise. Spend less time sitting. Even light physical activity can be beneficial. Watch cholesterol and blood lipids Have your blood tested for lipids and cholesterol at 71 years of age, then have this test every 5 years. You may need to have your cholesterol levels checked more often if: Your lipid or cholesterol levels are high. You are older than 71 years of age. You are at high risk for heart disease. What should I know about cancer screening? Many types of cancers can be detected early and may often be prevented. Depending on your health history and family history, you may need to have cancer screening at various ages. This may include screening for: Colorectal cancer. Prostate cancer. Skin cancer. Lung  cancer. What should I know about heart disease, diabetes, and high blood pressure? Blood pressure and heart disease High blood pressure causes heart disease and increases the risk of stroke. This is more likely to develop in people who have high blood pressure readings, are of African descent, or are overweight. Talk with your health care provider about your target blood pressure readings. Have your blood pressure checked: Every 3-5 years if you are 18-39 years of age. Every year if you are 40 years old or older. If you are between the ages of 65 and 75 and are a current or former smoker, ask your health care provider if you should have a one-time screening for abdominal aortic aneurysm (AAA). Diabetes Have regular diabetes screenings. This checks your fasting blood sugar level. Have the screening done: Once every three years after age 45 if you are at a normal weight and have a low risk for diabetes. More often and at a younger age if you are overweight or have a high risk for diabetes. What should I know about preventing infection? Hepatitis B If you have a higher risk for hepatitis B, you should be screened for this virus. Talk with your health care provider to find out if you are at risk for hepatitis B infection. Hepatitis C Blood testing is recommended for: Everyone born from 1945 through 1965. Anyone with known risk factors for hepatitis C. Sexually transmitted infections (STIs) You should be screened each year for STIs, including gonorrhea and chlamydia, if: You are sexually active and are younger than 71 years of age. You are older than 71 years   of age and your health care provider tells you that you are at risk for this type of infection. Your sexual activity has changed since you were last screened, and you are at increased risk for chlamydia or gonorrhea. Ask your health care provider if you are at risk. Ask your health care provider about whether you are at high risk for HIV.  Your health care provider may recommend a prescription medicine to help prevent HIV infection. If you choose to take medicine to prevent HIV, you should first get tested for HIV. You should then be tested every 3 months for as long as you are taking the medicine. Follow these instructions at home: Lifestyle Do not use any products that contain nicotine or tobacco, such as cigarettes, e-cigarettes, and chewing tobacco. If you need help quitting, ask your health care provider. Do not use street drugs. Do not share needles. Ask your health care provider for help if you need support or information about quitting drugs. Alcohol use Do not drink alcohol if your health care provider tells you not to drink. If you drink alcohol: Limit how much you have to 0-2 drinks a day. Be aware of how much alcohol is in your drink. In the [place] one drink equals one 12 oz bottle of beer (355 mL), one 5 oz glass of wine (148 mL), or one 1 oz glass of hard liquor (44 mL). General instructions Schedule regular health, dental, and eye exams. Stay current with your vaccines. Tell your health care provider if: You often feel depressed. You have ever been abused or do not feel safe at home. Summary Adopting a healthy lifestyle and getting preventive care are important in promoting health and wellness. Follow your health care provider's instructions about healthy diet, exercising, and getting tested or screened for diseases. Follow your health care provider's instructions on monitoring your cholesterol and blood pressure. This information is not intended to replace advice given to you by your health care provider. Make sure you discuss any questions you have with your health care provider. Document Revised: 08/21/2020 Document Reviewed: 06/06/2018 Elsevier Patient Education  2022 Elsevier Inc.  

## 2021-03-18 NOTE — Progress Notes (Signed)
I connected with  [name] [name] on 03/18/21 by a audio enabled telemedicine application and verified that I am speaking with the correct person using two identifiers.   Location of patient: Home Location of provider: Office  Persons participating in visit [name] [name] (patient) Loralyn Freshwater RMA   I discussed the limitations of evaluation and management by telemedicine. The patient expressed understanding and agreed to proceed.       Subjective:   MALE [name] [name] is a 70 y.o. male who presents for an Initial Medicare Annual Wellness Visit.  Review of Systems    Defer to PCP       Objective:    There were no vitals filed for this visit. There is no height or weight on file to calculate BMI.  Advanced Directives 03/18/2021 03/12/2020 10/21/2019 03/05/2019 02/04/2019 01/25/2019 01/31/2018  Does Patient Have a Medical Advance Directive? Yes Yes No Yes Yes Yes Yes  Type of Paramedic of [place];Living will;Out of facility DNR (pink MOST or yellow form) [place];Living will - Living will - Living will;Healthcare Power of [place];Living will  Does patient want to make changes to medical advance directive? Yes (ED - Information included in AVS) - - No - Patient declined No - Patient declined No - Patient declined No - Patient declined  Copy of [place] in Chart? - Yes - validated most recent copy scanned in chart (See row information) - - - - No - copy requested  Would patient like information on creating a medical advance directive? - - No - Patient declined - No - Patient declined - -    Current Medications (verified) Outpatient Encounter Medications as of 03/18/2021  Medication Sig   atorvastatin (LIPITOR) 80 MG tablet TAKE 1 TABLET DAILY AT     6:00PM   carvedilol (COREG) 3.125 MG tablet TAKE 1 TABLET TWICE DAILY  WITH MEALS.   clopidogrel (PLAVIX) 75 MG tablet TAKE 1 TABLET DAILY    Cyanocobalamin (VITAMIN B 12 PO) Take 1 tablet by mouth daily.   glucose blood (ONE TOUCH ULTRA TEST) test strip Use one strip per test. Test blood sugars 1-4 times daily as instructed.   irbesartan (AVAPRO) 300 MG tablet TAKE 1 TABLET DAILY.   metFORMIN (GLUCOPHAGE-XR) 500 MG 24 hr tablet Take 2 tablet (1000Mg  total) by mouth daily after supper   Misc Natural Products (GLUCOSAMINE CHOND COMPLEX/MSM) TABS    Multiple Vitamins-Minerals (MULTIVITAMIN WITH MINERALS) tablet Take 1 tablet by mouth daily.   nitroGLYCERIN (NITROSTAT) 0.4 MG SL tablet Place 1 tablet (0.4 mg total) under the tongue every 5 (five) minutes as needed for chest pain.   sildenafil (REVATIO) 20 MG tablet Take 1-5 tablets by mouth daily as needed.   spironolactone (ALDACTONE) 25 MG tablet TAKE 1 TABLET DAILY   vitamin B-12 (CYANOCOBALAMIN) 1000 MCG tablet Take 1,000 mcg by mouth daily.   [DISCONTINUED] Coenzyme Q10 (CO Q 10 PO) Take 200 mg by mouth daily.    No facility-administered encounter medications on file as of 03/18/2021.    Allergies (verified) Aspirin, Other, Oxycodone, Contrast media [iodinated diagnostic agents], and Norvasc [amlodipine besylate]   History: Past Medical History:  Diagnosis Date   Adenomatous colon polyp 09/07/2004   Amblyopia    CAD (coronary artery disease)    cardiac CT with a 50% LAD lesion in 2007   Diverticulosis    DM2 (diabetes mellitus, type 2) (HCC)    Elevated LFTs  secondary to labetalol   Esophageal stricture 2003   External hemorrhoids 2012   lanced by Dr Rise Patience   Family history of anesthesia complication    "sister had PONV"   GERD (gastroesophageal reflux disease)    esophageal stricture, pmh   Hiatal hernia 01/04/00   HLD (hyperlipidemia)    HTN (hypertension)    Hypopotassemia    Past Surgical History:  Procedure Laterality Date   BACK SURGERY     CARDIAC CATHETERIZATION  2007; 03/26/2014   COLONOSCOPY  2006   tics , ADENOMATOUS polyps. F/U due 2011    COLONOSCOPY  2012   Tics   CORONARY ANGIOPLASTY WITH STENT PLACEMENT  04/01/2014   "2"   ESOPHAGEAL DILATION  2003   ESOPHAGOGASTRODUODENOSCOPY  2003   ERD, stricture dilated   EXCISIONAL HEMORRHOIDECTOMY  ~ 2008   LEFT HEART CATHETERIZATION WITH CORONARY ANGIOGRAM N/A 03/26/2014   Procedure: LEFT HEART CATHETERIZATION WITH CORONARY ANGIOGRAM;  Surgeon: Peter M Martinique, MD;  Location[facility] CATH LAB;  Service: Cardiovascular;  Laterality: N/A;   PERCUTANEOUS CORONARY STENT INTERVENTION (PCI-S) N/A 04/01/2014   Procedure: PERCUTANEOUS CORONARY STENT INTERVENTION (PCI-S);  Surgeon: Peter M Martinique, MD;  [facility] C[facility] LAB;  Service: Cardiovascular;  Laterality: N/A;   POSTERIOR LAMINECTOMY / DECOMPRESSION LUMBAR SPINE  12/2009   L4 , Dr Sherwood Gambler   SHOULDER ARTHROSCOPY WITH ROTATOR CUFF REPAIR AND OPEN BICEPS TENODESIS Left 02/04/2019   Procedure: LEFT SHOULDER ARTHROSCOPY, BICEPS TENODESIS, MINI OPEN ROTATOR CUFF TEAR REPAIR;  Surgeon: Meredith Pel, MD;  Location: [place];  Service: Orthopedics;  Laterality: Left;   Family History  Problem Relation Age of Onset   Heart attack Mother 19   Stroke Mother 56   Prostate cancer Father    Diabetes Father    Heart disease Father 86       CBAG    Diabetes Brother    Heart attack Brother    Breast cancer Maternal Aunt    Heart attack Maternal Grandfather 61   Diabetes Paternal Grandmother    Stroke Paternal Grandfather 53   Heart disease Sister    Social History   Socioeconomic History   Marital status: Single    Spouse name: Not on file   Number of children: 0   Years of education: 16   Highest education level: Not on file  Occupational History   Occupation: Retired   Tobacco Use   Smoking status: Never   Smokeless tobacco: Never  Vaping Use   Vaping Use: Never used  Substance and Sexual Activity   Alcohol use: Yes    Alcohol/week: 2.0 standard drinks    Types: 2 Cans of beer per week    Comment: social    Drug use: No   Sexual activity: Not Currently  Other Topics Concern   Not on file  Social History Narrative   Fun: Designer, jewellery.    Social Determinants of Health   Financial Resource Strain: Low Risk    Difficulty of Paying Living Expenses: Not hard at all  Food Insecurity: No Food Insecurity   Worried About Charity fundraiser in the Last Year: Never true   [place] in the Last Year: Never true  Transportation Needs: No Transportation Needs   Lack of Transportation (Medical): No   Lack of Transportation (Non-Medical): No  Physical Activity: Insufficiently Active   Days of Exercise per Week: 3 days   Minutes of Exercise per Session: 20 min  Stress: Not on file  Social Connections: Moderately Isolated   Frequency of Communication with Friends and Family: More than three times a week   Frequency of Social Gatherings with Friends and Family: More than three times a week   Attends Religious Services: Never   Marine scientist or Organizations: No   Attends Music therapist: Never   Marital Status: Married    Tobacco Counseling Counseling given: Not Answered   Clinical Intake:     Pain : No/denies pain     Nutritional Risks: None Diabetes: Yes CBG done?: Yes CBG resulted in Enter/ Edit results?: Yes Did pt. bring in CBG monitor from home?: No  How often do you need to have someone help you when you read instructions, pamphlets, or other written materials from your doctor or pharmacy?: 1 - Never  Diabetic?Yes  Interpreter Needed?: No      Activities of Daily Living In your present state of health, do you have any difficulty performing the following activities: 03/18/2021  Hearing? N  Vision? Y  Comment Difficulty at night  Difficulty concentrating or making decisions? Y  Walking or climbing stairs? Y  Comment knee issues  Dressing or bathing? N  Doing errands, shopping? N  Some recent data might be hidden    Patient Care  Team: Vivi Barrack, MD as PCP - General (Family Medicine) Martinique, Peter M, MD as PCP - Cardiology (Cardiology) Shirley Muscat Loreen Freud, MD as Consulting Physician (Optometry) Marlou Sa, Tonna Corner, MD as Consulting Physician (Orthopedic Surgery) [facility] as Consulting Physician Madelin Rear, [facility] as Pharmacist (Pharmacist)  Indicate any recent Medical Services you may have received from other than [facility] providers in the past year (date may be approximate).     Assessment:   This is a routine wellness examination for [name] [name].  Hearing/Vision screen No results found.  Dietary issues and exercise activities discussed:     Goals Addressed   None   Depression Screen PHQ 2/9 Scores 03/18/2021 03/08/2021 03/08/2021 03/12/2020 03/05/2019 02/21/2019 08/08/2018  PHQ - 2 Score 0 0 0 0 0 0 0  PHQ- 9 Score 0 - - - - - -    Fall Risk Fall Risk  03/18/2021 03/08/2021 03/12/2020 03/05/2019 02/21/2019  Falls in the past year? 0 0 1 0 0  Number falls in past yr: 0 0 1 0 -  Injury with Fall? 0 0 1 0 -  Comment - - feel off bike - -  Risk for fall due to : No Fall Risks No Fall Risks History of fall(s);Impaired vision;Impaired balance/gait;Impaired mobility - -  Risk for fall due to: Comment - - not like i use to be - -  Follow up Education provided Education provided Falls prevention discussed Education provided;Falls evaluation completed -    FALL RISK PREVENTION PERTAINING TO THE HOME:  Any stairs in or around the home? No  If so, are there any without handrails? No  Home free of loose throw rugs in walkways, pet beds, electrical cords, etc? Yes  Adequate lighting in your home to reduce risk of falls? Yes   ASSISTIVE DEVICES UTILIZED TO PREVENT FALLS:  Life alert? No  Use of a cane, walker or w/c? No  Grab bars in the bathroom? No  Shower chair or bench in shower? No  Elevated toilet seat or a handicapped toilet? No   TIMED UP AND GO:  Was the test performed?  N/A .  Length  of time to  ambulate 10 feet: N/A sec.     Cognitive Function:     6CIT Screen 03/18/2021 03/12/2020 01/31/2018  What Year? 0 points 0 points 0 points  What month? 0 points 0 points 0 points  What time? 0 points - 0 points  Count back from 20 0 points 0 points 0 points  Months in reverse 0 points 0 points 0 points  Repeat phrase 0 points 0 points -  Total Score 0 - -    Immunizations Immunization History  Administered Date(s) Administered   Fluad Quad(high Dose 65+) 02/21/2019, 04/07/2020, 03/08/2021   Influenza,inj,Quad PF,6+ Mos 03/19/2014   PFIZER(Purple Top)SARS-COV-2 Vaccination 08/01/2019, 08/27/2019, 03/25/2020, 03/12/2021   Pneumococcal Conjugate-13 01/17/2017   Pneumococcal Polysaccharide-23 01/31/2018   Td 12/18/2008   Tdap 04/07/2020   Zoster, Live 12/24/2012    TDAP status: Up to date  Flu Vaccine status: Up to date  Pneumococcal vaccine status: Up to date  Covid-19 vaccine status: Completed vaccines  Qualifies for Shingles Vaccine? Yes   Zostavax completed Yes   Shingrix Completed?: No.    Education has been provided regarding the importance of this vaccine. Patient has been advised to call insurance company to determine out of pocket expense if they have not yet received this vaccine. Advised may also receive vaccine at local pharmacy or Health Dept. Verbalized acceptance and understanding.  Screening Tests Health Maintenance  Topic Date Due   Zoster Vaccines- Shingrix (1 of 2) Never done   FOOT EXAM  02/21/2020   COLONOSCOPY (Pts 45-50yrs Insurance coverage will need to be confirmed)  03/27/2021 (Originally 04/24/2016)   OPHTHALMOLOGY EXAM  04/27/2021   COVID-19 Vaccine (5 - Booster for Pfizer series) 07/12/2021   HEMOGLOBIN A1C  09/05/2021   TETANUS/TDAP  04/07/2030   INFLUENZA VACCINE  Completed   Hepatitis C Screening  Completed   HPV VACCINES  Aged Out    Health Maintenance  Health Maintenance Due  Topic Date Due   Zoster Vaccines- Shingrix  (1 of 2) Never done   FOOT EXAM  02/21/2020    Colorectal cancer screening: Type of screening: Colonoscopy. Completed 04/25/2011. Repeat every 5 years  Lung Cancer Screening: (Low Dose CT Chest recommended if Age 52-80 years, 30 pack-year currently smoking OR have quit w/in 15years.) does not qualify.   Lung Cancer Screening Referral: N/A  Additional Screening:  Hepatitis C Screening: does qualify; Completed 01/31/2018  Vision Screening: Recommended annual ophthalmology exams for early detection of glaucoma and other disorders of the eye. Is the patient up to date with their annual eye exam?  Yes  Who is the provider or what is the name of the office in which the patient attends annual eye exams? [facility]    If pt is not established with a provider, would they like to be referred to a provider to establish care? No .   Dental Screening: Recommended annual dental exams for proper oral hygiene  Community Resource Referral / Chronic Care Management: CRR required this visit?  No   CCM required this visit?  No      Plan:     I have personally reviewed and noted the following in the patient's chart:   Medical and social history Use of alcohol, tobacco or illicit drugs  Current medications and supplements including opioid prescriptions. Patient is not currently taking opioid prescriptions. Functional ability and status Nutritional status Physical activity Advanced directives List of other physicians Hospitalizations, surgeries, and ER visits in previous 12 months Vitals Screenings to  include cognitive, depression, and falls Referrals and appointments  In addition, I have reviewed and discussed with patient certain preventive protocols, quality metrics, and best practice recommendations. A written personalized care plan for preventive services as well as general preventive health recommendations were provided to patient.     Loralyn Freshwater, [facility]   03/18/2021    Nurse Notes: Non face to face 50 minutes   Mr. Helminiak , Thank you for taking time to come for your Medicare Wellness Visit. I appreciate your ongoing commitment to your health goals. Please review the following plan we discussed and let me know if I can assist you in the future.   These are the goals we discussed:  Goals      DIET - INCREASE WATER INTAKE     Drinks 2 bottles of water a day but wants to increase to more     Patient Stated     Lose weight to 180lbs and reduced [place] (see longitudinal plan of care for additional care plan information)  Current Barriers:  Chronic Disease Management support, education, and care coordination needs related to Hypertension, Hyperlipidemia, and Diabetes   Hypertension BP Readings from Last 3 Encounters:  03/05/20 (!) 149/78  02/20/20 119/82  10/22/19 110/70  Pharmacist Clinical Goal(s): Over the next 180 days, patient will work with PharmD and providers to maintain BP goal <140/90 Current regimen:  Metformin 500 mg twice daily  Interventions: Review switch to metformin XR 1000 mg daily with evening meal  Patient self care activities - Over the next 180 days, patient will: Check BP once every 1-2 weeks, document, and provide at future appointments Ensure daily salt intake < 2300 mg/day  Hyperlipidemia Lab Results  Component Value Date/Time   LDLCALC 68 03/05/2020 10:09 AM  Pharmacist Clinical Goal(s): Over the next 180 days, patient will work with PharmD and providers to maintain LDL goal < 100 Current regimen:  Atorvastatin 80 mg once daily Interventions: Continue current management Patient self care activities - Over the next 180 days, patient will: Continue current management  Diabetes Lab Results  Component Value Date/Time   HGBA1C 7.5 (H) 03/05/2020 10:09 AM   HGBA1C 7.2 (A) 08/28/2019 10:22 AM   HGBA1C 7.2 (H) 02/21/2019 11:23 AM  Pharmacist Clinical Goal(s): Over the  next 180 days, patient will work with PharmD and providers to achieve A1c goal <7% Current regimen:  Metformin 500 mg twice daily with meals Interventions: Consider switch to metformin XL 1000 mg once daily Patient self care activities - Over the next 180 days, patient will: Check blood sugar as directed, document, and provide at future appointments Contact provider with any episodes of hypoglycemia  Medication management Pharmacist Clinical Goal(s): Over the next 180 days, patient will work with PharmD and providers to achieve optimal medication adherence Current pharmacy: Mail order Interventions Comprehensive medication review performed. Continue current medication management strategy. Patient self care activities - Over the next 180 days, patient will: Focus on medication adherence by reporting any questions or concerns to PharmD and/or provider(s)  Please see past updates related to this goal by clicking on the "Past Updates" button in the selected goal .     Weight (lb) < 200 lb (90.7 kg)     Would like to lose 10lbs in the next year        This is a list of the screening recommended for you and due dates:  Health Maintenance  Topic Date Due   Zoster (Shingles) Vaccine (1 of 2) Never done   Complete foot exam   02/21/2020   Colon Cancer Screening  03/27/2021*   Eye exam for diabetics  04/27/2021   COVID-19 Vaccine (5 - Booster for Pfizer series) 07/12/2021   Hemoglobin A1C  09/05/2021   Tetanus Vaccine  04/07/2030   Flu Shot  Completed   Hepatitis C Screening: USPSTF Recommendation to screen - Ages 18-79 yo.  Completed   HPV Vaccine  Aged Out  *Topic was postponed. The date shown is not the original due date.

## 2021-03-23 ENCOUNTER — Telehealth: Payer: Self-pay

## 2021-03-23 NOTE — Telephone Encounter (Signed)
OK to hold Plavix for planned ortho surgery  Trudie Cervantes Martinique MD, [facility]

## 2021-03-23 NOTE — Telephone Encounter (Signed)
Dr. Martinique  This patient is to undergo TKR 05/11/21. Ortho team asking to hold Plavix 7 days prior to surgery however I see that he is allergic to ASA and is on long term Plavix therapy given CAD with stent placement. He underwent Lexiscan stress test in lieu of surgery that was low risk 08/2020.    Can you send your antiplatelet holding recommendations to the pre-op pool?   Thank you

## 2021-03-23 NOTE — Telephone Encounter (Signed)
   [facility] Pre-operative Risk Assessment    Patient Name: [name] [name]  DOB: Feb 13, 1951 MRN: [id number]  HEARTCARE STAFF:  - IMPORTANT!!!!!! Under Visit Info/Reason for Call, type in Other and utilize the format Clearance MM/DD/YY or Clearance TBD. Do not use dashes or single digits. - Please review there is not already an duplicate clearance open for this procedure. - If request is for dental extraction, please clarify the # of teeth to be extracted. - If the patient is currently at the dentist's office, call Pre-Op Callback Staff (MA/nurse) to input urgent request.  - If the patient is not currently in the dentist office, please route to the Pre-Op pool.  Request for surgical clearance:  What type of surgery is being performed? Right Total Knee Arthroplasty  When is this surgery scheduled? 05/11/21  What type of clearance is required (medical clearance vs. Pharmacy clearance to hold med vs. Both)? BOTH   Are there any medications that need to be held prior to surgery and how long? PLAVIX- 7 days  Practice name and name of physician performing surgery? Dr. Paralee Cancel- [facility]  What is the office phone number? [phone]   7.   What is the office fax number? [phone] ATTN: Derl Barrow  8.   Anesthesia type (None, local, MAC, general) ? Spinal    Johnette Teigen B Ramesh Moan 03/23/2021, 7:21 AM  _________________________________________________________________   (provider comments below)

## 2021-03-23 NOTE — Telephone Encounter (Signed)
    Patient Name: [name] [name]  DOB: 1950-08-22 MRN: [id number]  Primary Cardiologist: [name], MD  Chart reviewed as part of pre-operative protocol coverage. Given past medical history and time since last visit, based on ACC/AHA guidelines, [name] [name] would be at acceptable risk for the planned procedure without further cardiovascular testing.   Case discussed with Dr. Martinique who feels that it will be acceptable to hold his Plavix prior to procedure then resume once stable from a bleeding standpoint thereafter. He was last seen by Dr. Martinique and underwent a Lexiscan stress test in anticipation of this surgery which was low risk.   The patient was advised that if he develops new symptoms prior to surgery to contact our office to arrange for a follow-up visit, and he verbalized understanding.  I will route this recommendation to the requesting party via Epic fax function and remove from pre-op pool.  Please call with questions.  Kathyrn Drown, NP 03/23/2021, 1:22 PM

## 2021-03-29 NOTE — Progress Notes (Signed)
Chronic Care Management Pharmacy Note  03/30/2021 Name:  [name] [name] MRN:  [id number] DOB:  1951/06/22  Recommendations/Changes made from today's visit: None - patient doing well overall, recommend monitor home BP at least occasionally.  Subjective: [name] [name] is an 69 y.o. year old male who is a primary patient of [name], Algis Greenhouse, MD.  The CCM team was consulted for assistance with disease management and care coordination needs.    Engaged with patient by telephone for follow up visit in response to provider referral for pharmacy case management and/or care coordination services.   Consent to Services:  The patient was given information about Chronic Care Management services, agreed to services, and gave verbal consent prior to initiation of services.  Please see initial visit note for detailed documentation.   Patient Care Team: Vivi Barrack, MD as PCP - General (Family Medicine) Martinique, Peter M, MD as PCP - Cardiology (Cardiology) Calton Dach, MD as Consulting Physician (Optometry) Marlou Sa, Tonna Corner, MD as Consulting Physician (Orthopedic Surgery) [facility] as Consulting Physician Edythe Clarity, [facility] (Pharmacist)  Hospital visits: None in previous 6 months  Objective:  Lab Results  Component Value Date   CREATININE 0.98 09/02/2020   CREATININE 0.96 03/05/2020   CREATININE 1.07 10/21/2019    Lab Results  Component Value Date   HGBA1C 7.0 (A) 03/08/2021   Last diabetic Eye exam:  Lab Results  Component Value Date/Time   HMDIABEYEEXA No Retinopathy 04/27/2020 01:13 PM    Last diabetic Foot exam: No results found for: HMDIABFOOTEX      Component Value Date/Time   CHOL 110 09/02/2020 1010   CHOL 132 08/18/2016 0814   TRIG 100.0 09/02/2020 1010   TRIG 82 04/11/2006 0804   HDL 35.80 (L) 09/02/2020 1010   HDL 41 08/18/2016 0814   CHOLHDL 3 09/02/2020 1010   VLDL 20.0 09/02/2020 1010   LDLCALC 54 09/02/2020 1010    LDLCALC 68 03/05/2020 1009    Hepatic Function Latest Ref Rng & Units 09/02/2020 03/05/2020 10/21/2019  Total Protein 6.0 - 8.3 g/dL 6.4 6.2 7.6  Albumin 3.5 - 5.2 g/dL 4.0 - 4.5  AST 0 - 37 U/L '14 16 20  ' ALT 0 - 53 U/L '21 22 23  ' Alk Phosphatase 39 - 117 U/L 82 - 85  Total Bilirubin 0.2 - 1.2 mg/dL 0.6 0.8 0.7  Bilirubin, Direct 0.00 - 0.40 mg/dL - - -    Lab Results  Component Value Date/Time   TSH 0.96 09/02/2020 10:10 AM   TSH 0.63 03/05/2020 10:09 AM    CBC Latest Ref Rng & Units 09/02/2020 03/05/2020 10/21/2019  WBC 4.0 - 10.5 K/uL 8.2 7.4 13.5(H)  Hemoglobin 13.0 - 17.0 g/dL 14.4 14.2 16.3  Hematocrit 39.0 - 52.0 % 41.9 42.1 47.9  Platelets 150.0 - 400.0 K/uL 205.0 243 239    No results found for: VD25OH  Clinical ASCVD:  The ASCVD Risk score (Arnett DK, et al., 2019) failed to calculate for the following reasons:   The valid total cholesterol range is 130 to 320 mg/dL    Other: (CHADS2VASc if Afib, PHQ9 if depression, MMRC or CAT for COPD, ACT, DEXA)  Social History   Tobacco Use  Smoking Status Never  Smokeless Tobacco Never   BP Readings from Last 3 Encounters:  03/08/21 (!) 144/80  09/18/20 120/70  09/02/20 123/78   Pulse Readings from Last 3 Encounters:  03/08/21 (!) 56  09/18/20 60  09/02/20 Marland Kitchen)  60   Wt Readings from Last 3 Encounters:  03/08/21 185 lb (83.9 kg)  09/23/20 194 lb (88 kg)  09/18/20 194 lb (88 kg)    Assessment: Review of patient past medical history, allergies, medications, health status, including review of consultants reports, laboratory and other test data, was performed as part of comprehensive evaluation and provision of chronic care management services.   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 Sig Documenting Provider Last Dose Status Informant  atorvastatin (LIPITOR) 80 MG tablet [id number] Yes TAKE 1 TABLET DAILY AT     6:00PM Vivi Barrack, MD Taking Active   carvedilol (COREG) 3.125 MG tablet [id number] Yes TAKE 1 TABLET TWICE DAILY  WITH MEALS. Vivi Barrack, MD Taking Active   clopidogrel (PLAVIX) 75 MG tablet [id number] Yes TAKE 1 TABLET DAILY Vivi Barrack, MD Taking Active   Cyanocobalamin (VITAMIN B 12 PO) [id number]  Take 1 tablet by mouth daily. [provider]  Active Self  glucose blood (ONE TOUCH ULTRA TEST) test strip [id number] Yes Use one strip per test. Test blood sugars 1-4 times daily as instructed. Golden Circle, FNP Taking Active   irbesartan (AVAPRO) 300 MG tablet [id number] Yes TAKE 1 TABLET DAILY. Vivi Barrack, MD Taking Active   metFORMIN (GLUCOPHAGE-XR) 500 MG 24 hr tablet [id number] Yes Take 2 tablet (1000Mg total) by mouth daily after supper Vivi Barrack, MD Taking Active   Misc Natural Products (GLUCOSAMINE CHOND COMPLEX/MSM) Sheral Flow [id number] Yes  [provider] Taking Active   Multiple Vitamins-Minerals (MULTIVITAMIN WITH MINERALS) tablet [id number] Yes Take 1 tablet by mouth daily. [provider] Taking Active Self  nitroGLYCERIN (NITROSTAT) 0.4 MG SL tablet [id number] Yes Place 1 tablet (0.4 mg total) under the tongue every 5 (five) minutes as needed for chest pain. Martinique, Peter M, MD Taking Active            Med Note Netta Neat, Hermina Staggers   Fri Sep 18, 2020  3:42 PM)    sildenafil (REVATIO) 20 MG tablet [id number] Yes Take 1-5 tablets by mouth daily as needed. Vivi Barrack, MD Taking Active   spironolactone (ALDACTONE) 25  MG tablet [id number] Yes TAKE 1 TABLET DAILY Vivi Barrack, MD Taking Active   vitamin B-12 (CYANOCOBALAMIN) 1000 MCG tablet [id number] Yes Take 1,000 mcg by mouth daily. [provider] Taking Active             Patient Active Problem List   Diagnosis Date Noted   Nephrolithiasis 03/05/2020   Labral tear of long head of biceps tendon, left, sequela    Complete tear of left rotator cuff    Diverticulosis of colon (without mention of hemorrhage) 04/25/2011   Adenomatous colon polyp 03/17/2011   LOW BACK PAIN, CHRONIC 12/13/2007   Hyperlipidemia associated with type 2 diabetes mellitus ([place]) 06/08/2007   T2DM (type 2 diabetes mellitus) ([place]) 02/27/2007  Hypertension associated with diabetes ([place]) 02/27/2007   AMBLYOPIA 12/06/2006   GERD 12/06/2006    Immunization History  Administered Date(s) Administered   Fluad Quad(high Dose 65+) 02/21/2019, 04/07/2020, 03/08/2021   Influenza,inj,Quad PF,6+ Mos 03/19/2014   PFIZER(Purple Top)SARS-COV-2 Vaccination 08/01/2019, 08/27/2019, 03/25/2020, 03/12/2021   Pneumococcal Conjugate-13 01/17/2017   Pneumococcal Polysaccharide-23 01/31/2018   Td 12/18/2008   Tdap 04/07/2020   Zoster, Live 12/24/2012    Conditions to be addressed/monitored: HLD HTN DMII OA   Care Plan : [place]  Updates made by Edythe Clarity, RPH since 03/30/2021 12:00 AM     Problem: HLD HTN DMII OA   Priority: High     Long-Range Goal: Disease Management   Start Date: 12/01/2020  Expected End Date: 12/01/2021  Recent Progress: On track  Priority: High  Note:   Interventions: 1:1 collaboration with Vivi Barrack, MD regarding development and update of comprehensive plan of care as evidenced by provider attestation and co-signature Inter-disciplinary care team collaboration (see longitudinal plan of care) Comprehensive medication review performed; medication list updated in electronic medical record No Rx changes  Hypertension (BP  goal <140/90) -Controlled -Current treatment: Irbesartan 300 mg once daily Carvedilol 3.125 mg twice daily Spironolactone 25 mg once daily -Current home readings: at goal -Current dietary habits: some processed foods and carbs - rice, bagels -Current exercise habits: limited due to knee pain - is strongly considering knee replacement -Denies hypotensive/hypertensive symptoms -Educated on Symptoms of hypotension and importance of maintaining adequate hydration; -Counseled to monitor BP at home 1-2x/month, document, and provide log at future appointments -Counseled on diet and exercise extensively Recommended to continue current medication  Update 03/30/21 155/80 this morning, patient was dealing with stressor of switching cellular phones.  Normally controlled at home per pt and has been controlled previously in office.  No changes to medications.  Reports 100% adherence.  Recommend he check BP at least once or twice weekly and reports consistently elevated BP's to myself if < 140/90.  Hyperlipidemia: (LDL goal < 70) -Controlled -Current treatment: Atorvastatin 80 mg once daily -Educated on Cholesterol goals;  -Recommended to continue current medication  Diabetes (A1c goal <7%) -Not ideally controlled -Current medications: Metformin 500 mg XR - two tablets once daily -Medications previously tried: IR (did not tolerate) -Current home glucose readings fasting glucose: not testing post prandial glucose: not testing -Educated on A1c and blood sugar goals; -Reviewed side effects - tolerating XR preparation much better, now is able to take consistently due to not needing to bring meds to work with him -Counseled to check feet daily and get yearly eye exams -Counseled on diet and exercise extensively Recommended to continue current medication  Update 03/30/21 Continues to tolerate XR metformin.  Takes all at one time which has improved adherence.  A1c has decreased to 7.0.  He is now  more conscious of what he is eating.  Trying to substitute protein shakes with fruit as snacks to fill him up and limit cravings.  Continue current meds.          Current Barriers:  Unable to maintain control of DM - goal to optimize lifestyle (diet/exercise) choices  Pharmacist Clinical Goal(s):  Patient will contact provider office for questions/concerns as evidenced notation of same in electronic health record through collaboration with PharmD and provider.   Patient Goals/Self-Care Activities Patient will:  - take medications as prescribed target a minimum of 150 minutes of moderate intensity exercise weekly  Follow Up Plan:  [facility]  f/u call 6 months Medication Assistance: None required.  Patient affirms current coverage meets needs.  Patient's preferred pharmacy is:  [facility] [id number], [place] [address] Phone: [phone] Fax: [phone]  [facility] [address] [address] [place] [address] Phone: [phone] Fax: [place] [place] [address] Phone: [phone] Fax: [phone]  Follow Up:  Patient agrees to Care Plan and Follow-up.  Future Appointments  Date Time Provider [place]  04/30/2021 11:00 AM [facility] PAT 1 [facility] None  09/08/2021  9:20 AM Vivi Barrack, MD [facility] [place], PharmD Clinical Pharmacist [phone]

## 2021-03-30 ENCOUNTER — Ambulatory Visit (INDEPENDENT_AMBULATORY_CARE_PROVIDER_SITE_OTHER): Payer: Medicare Other | Admitting: Pharmacist

## 2021-03-30 DIAGNOSIS — E1159 Type 2 diabetes mellitus with other circulatory complications: Secondary | ICD-10-CM

## 2021-03-30 DIAGNOSIS — E1165 Type 2 diabetes mellitus with hyperglycemia: Secondary | ICD-10-CM

## 2021-03-30 NOTE — Patient Instructions (Signed)
Visit Information   Goals Addressed   None    Patient Care Plan: CCM Pharmacy Care Plan     Problem Identified: HLD HTN DMII OA   Priority: High     Long-Range Goal: Disease Management   Start Date: 12/01/2020  Expected End Date: 12/01/2021  This Visit's Progress: On track  Priority: High  Note:   Interventions: 1:1 collaboration with Vivi Barrack, MD regarding development and update of comprehensive plan of care as evidenced by provider attestation and co-signature Inter-disciplinary care team collaboration (see longitudinal plan of care) Comprehensive medication review performed; medication list updated in electronic medical record No Rx changes  Hypertension (BP goal <140/90) -Controlled -Current treatment: Irbesartan 300 mg once daily Carvedilol 3.125 mg twice daily Spironolactone 25 mg once daily -Current home readings: at goal -Current dietary habits: some processed foods and carbs - rice, bagels -Current exercise habits: limited due to knee pain - is strongly considering knee replacement -Denies hypotensive/hypertensive symptoms -Educated on Symptoms of hypotension and importance of maintaining adequate hydration; -Counseled to monitor BP at home 1-2x/month, document, and provide log at future appointments -Counseled on diet and exercise extensively Recommended to continue current medication  Hyperlipidemia: (LDL goal < 70) -Controlled -Current treatment: Atorvastatin 80 mg once daily -Educated on Cholesterol goals;  -Recommended to continue current medication  Diabetes (A1c goal <7%) -Not ideally controlled -Current medications: Metformin 500 mg XR - two tablets once daily -Medications previously tried: IR (did not tolerate) -Current home glucose readings fasting glucose: not testing post prandial glucose: not testing -Educated on A1c and blood sugar goals; -Reviewed side effects - tolerating XR preparation much better, now is able to take consistently  due to not needing to bring meds to work with him -Counseled to check feet daily and get yearly eye exams -Counseled on diet and exercise extensively Recommended to continue current medication        Patient verbalizes understanding of instructions provided today and agrees to view in [place].  Telephone follow up appointment with pharmacy team member scheduled for: 6 months  Edythe Clarity, [place]

## 2021-04-23 DIAGNOSIS — M1711 Unilateral primary osteoarthritis, right knee: Secondary | ICD-10-CM | POA: Diagnosis not present

## 2021-04-26 DIAGNOSIS — E1159 Type 2 diabetes mellitus with other circulatory complications: Secondary | ICD-10-CM

## 2021-04-26 DIAGNOSIS — I152 Hypertension secondary to endocrine disorders: Secondary | ICD-10-CM | POA: Diagnosis not present

## 2021-04-26 DIAGNOSIS — E1165 Type 2 diabetes mellitus with hyperglycemia: Secondary | ICD-10-CM

## 2021-04-27 ENCOUNTER — Telehealth: Payer: Self-pay
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
## 2021-04-27 IMAGING — DX DG AC JOINTS*L*
2 series · 2 of 2 positions shown · non-contrast
Comparison: Previous shoulder imaging on the left.

CLINICAL DATA: Acute right shoulder pain.

EXAM:
LEFT ACROMIOCLAVICULAR JOINTS

[ac joint with weights]
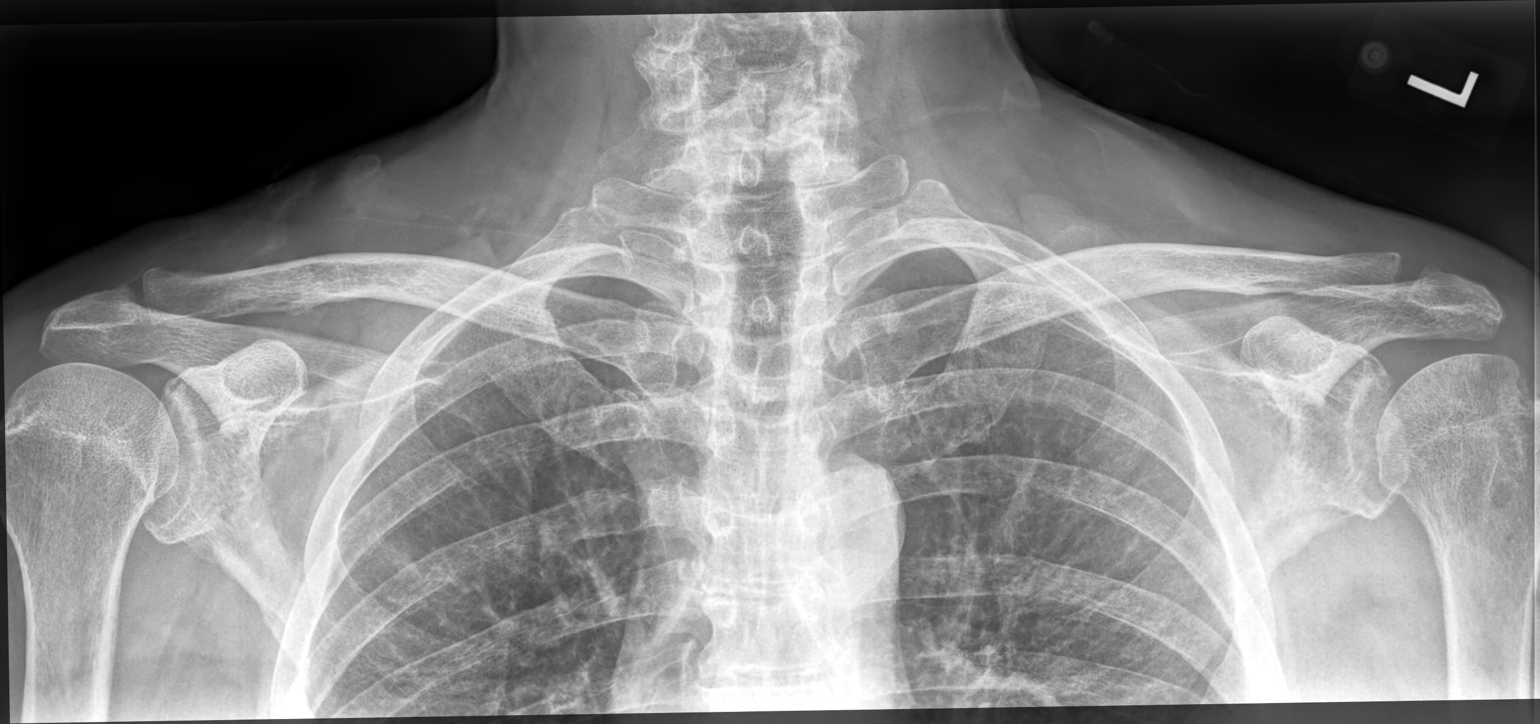

[ac joint without weights]
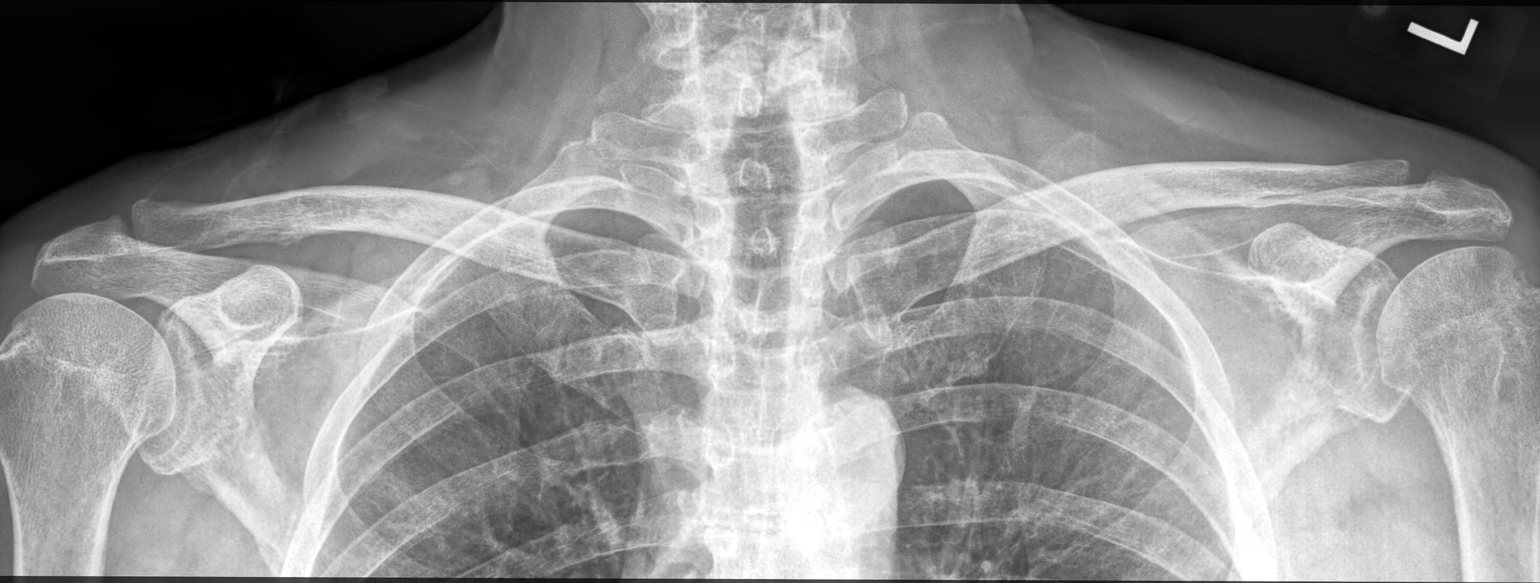

[2 of 2 positions shown; findings below may reference images not displayed]

FINDINGS: Mild acromioclavicular degenerative changes bilaterally. No signs of
fracture. No signs of joint widening or asymmetry.

Visualized lung apices are clear.

Following weighting there is no change in the appearance of the
acromioclavicular jointsw.
IMPRESSION: Mild acromioclavicular degenerative changes bilaterally. No acute
bony abnormality.

## 2021-04-27 NOTE — Telephone Encounter (Signed)
Pt called wanting to know if Dr [name] has signed and faxed a form for surgical clearance. Clearance is for knee surgery with Dr Alvan Dame. Please Advise.

## 2021-04-28 DIAGNOSIS — H40013 Open angle with borderline findings, low risk, bilateral: Secondary | ICD-10-CM | POA: Diagnosis not present

## 2021-04-28 DIAGNOSIS — H04123 Dry eye syndrome of bilateral lacrimal glands: Secondary | ICD-10-CM | POA: Diagnosis not present

## 2021-04-28 DIAGNOSIS — H53001 Unspecified amblyopia, right eye: Secondary | ICD-10-CM | POA: Diagnosis not present

## 2021-04-28 DIAGNOSIS — H25813 Combined forms of age-related cataract, bilateral: Secondary | ICD-10-CM | POA: Diagnosis not present

## 2021-04-28 DIAGNOSIS — E119 Type 2 diabetes mellitus without complications: Secondary | ICD-10-CM | POA: Diagnosis not present

## 2021-04-28 LAB — HM DIABETES EYE EXAM

## 2021-04-29 ENCOUNTER — Encounter (HOSPITAL_COMMUNITY): Payer: Self-pay

## 2021-04-29 DIAGNOSIS — Z85828 Personal history of other malignant neoplasm of skin: Secondary | ICD-10-CM | POA: Diagnosis not present

## 2021-04-29 DIAGNOSIS — L905 Scar conditions and fibrosis of skin: Secondary | ICD-10-CM | POA: Diagnosis not present

## 2021-04-29 DIAGNOSIS — L218 Other seborrheic dermatitis: Secondary | ICD-10-CM | POA: Diagnosis not present

## 2021-04-29 DIAGNOSIS — L738 Other specified follicular disorders: Secondary | ICD-10-CM | POA: Diagnosis not present

## 2021-04-29 NOTE — Telephone Encounter (Signed)
Called patient aware we have not received form  Will call surgeon office to fax form

## 2021-04-29 NOTE — Patient Instructions (Signed)
DUE TO COVID-19 ONLY ONE VISITOR IS ALLOWED TO COME WITH YOU AND STAY IN THE WAITING ROOM ONLY DURING PRE OP AND PROCEDURE DAY OF SURGERY IF YOU ARE GOING HOME AFTER SURGERY. IF YOU ARE SPENDING THE NIGHT 2 PEOPLE MAY VISIT WITH YOU IN YOUR PRIVATE ROOM AFTER SURGERY UNTIL VISITING  HOURS ARE OVER AT 8:00 PM AND 1 VISITOR CAN SPEND THE NIGHT.   YOU NEED TO HAVE A COVID 19 TEST ON_11/11__THIS TEST MUST BE DONE BEFORE SURGERY,  COVID TESTING SITE  IS LOCATED AT [place], [place]. REMAIN IN YOUR CAR THIS IS A DRIVE UP TEST. AFTER YOUR COVID TEST PLEASE WEAR A MASK OUT IN PUBLIC AND SOCIAL DISTANCE AND [place] YOUR HANDS FREQUENTLY, ALSO ASK ALL YOUR CLOSE CONTACT PERSONS TO WEAR A MASK AND SOCIAL DISTANCE AND [place] THEIR HANDS FREQUENTLY ALSO.               [name] [name]     Your procedure is scheduled on: 05/11/21   Report to [facility] Main  Entrance   Report to admitting at  10:10 AM     Call this number if you have problems the morning of surgery [phone]    No food after midnight.    You may have clear liquid until 9:30 AM.    At 9:00 AM drink pre surgery drink.   Nothing by mouth after 9:30 AM.   CLEAR LIQUID DIET   Foods Allowed                                                                     Foods Excluded  Coffee and tea, regular and decaf    No milk or creamer                                                            liquids that you cannot  Plain Jell-O any favor except red or purple                                           see through such as: Fruit ices (not with fruit pulp)                                     milk, soups, orange juice  Iced Popsicles                                    All solid food Carbonated beverages, regular and diet                                    Cranberry, grape and apple juices Sports drinks like Gatorade Lightly seasoned clear broth or consume(fat free)  Sugar   BRUSH YOUR TEETH MORNING OF SURGERY AND  RINSE YOUR MOUTH OUT, NO CHEWING GUM CANDY OR MINTS.     Take these medicines the morning of surgery with A SIP OF WATER: Carvedilol  Stop taking _Plavix__________on __________as instructed by _____________.   Contact your Surgeon/Cardiologist for instructions on Anticoagulant Therapy prior to surgery.   How to Manage Your Diabetes Before and After Surgery  Why is it important to control my blood sugar before and after surgery? Improving blood sugar levels before and after surgery helps healing and can limit problems. A way of improving blood sugar control is eating a healthy diet by:  Eating less sugar and carbohydrates  Increasing activity/exercise  Talking with your doctor about reaching your blood sugar goals High blood sugars (greater than 180 mg/dL) can raise your risk of infections and slow your recovery, so you will need to focus on controlling your diabetes during the weeks before surgery. Make sure that the doctor who takes care of your diabetes knows about your planned surgery including the date and location.  How do I manage my blood sugar before surgery? Check your blood sugar at least 4 times a day, starting 2 days before surgery, to make sure that the level is not too high or low. Check your blood sugar the morning of your surgery when you wake up and every 2 hours until you get to the Short Stay unit. If your blood sugar is less than 70 mg/dL, you will need to treat for low blood sugar: Do not take insulin. Treat a low blood sugar (less than 70 mg/dL) with  cup of clear juice (cranberry or apple), 4 glucose tablets, OR glucose gel. Recheck blood sugar in 15 minutes after treatment (to make sure it is greater than 70 mg/dL). If your blood sugar is not greater than 70 mg/dL on recheck, call [phone] for further instructions. Report your blood sugar to the short stay nurse when you get to Short Stay.  If you are admitted to the hospital after surgery: Your blood  sugar will be checked by the staff and you will probably be given insulin after surgery (instead of oral diabetes medicines) to make sure you have good blood sugar levels. The goal for blood sugar control after surgery is 80-180 mg/dL.   WHAT DO I DO ABOUT MY DIABETES MEDICATION?  Do not take oral diabetes medicines (pills) the morning of surgery.                                 You may not have any metal on your body including              piercings  Do not wear jewelry,  lotions, powders or  deodorant             Men may shave face and neck.   Do not bring valuables to the hospital. [place].  Contacts, dentures or bridgework may not be worn into surgery.  Leave suitcase in the car. After surgery it may be brought to your room.               [place] [facility] - Preparing for Surgery Before surgery, you can play an important role.  Because skin is not sterile, your skin needs to be as free of  germs as possible.  You can reduce the number of germs on your skin by washing with CHG (chlorahexidine gluconate) soap before surgery.  CHG is an antiseptic cleaner which kills germs and bonds with the skin to continue killing germs even after washing. Please DO NOT use if you have an allergy to CHG or antibacterial soaps.  If your skin becomes reddened/irritated stop using the CHG and inform your nurse when you arrive at Short Stay.  You may shave your face/neck. Please follow these instructions carefully:  1.  Shower with CHG Soap the night before surgery and the  morning of Surgery.  2.  If you choose to wash your hair, wash your hair first as usual with your  normal  shampoo.  3.  After you shampoo, rinse your hair and body thoroughly to remove the  shampoo.                            4.  Use CHG as you would any other liquid soap.  You can apply chg directly  to the skin and wash                       Gently with a scrungie or clean washcloth.  5.   Apply the CHG Soap to your body ONLY FROM THE NECK DOWN.   Do not use on face/ open                           Wound or open sores. Avoid contact with eyes, ears mouth and genitals (private parts).                       Wash face,  Genitals (private parts) with your normal soap.             6.  Wash thoroughly, paying special attention to the area where your surgery  will be performed.  7.  Thoroughly rinse your body with warm water from the neck down.  8.  DO NOT shower/wash with your normal soap after using and rinsing off  the CHG Soap.                9.  Pat yourself dry with a clean towel.            10.  Wear clean pajamas.            11.  Place clean sheets on your bed the night of your first shower and do not  sleep with pets. Day of Surgery : Do not apply any lotions/deodorants the morning of surgery.  Please wear clean clothes to the hospital/surgery center.  FAILURE TO FOLLOW THESE INSTRUCTIONS MAY RESULT IN THE CANCELLATION OF YOUR SURGERY PATIENT SIGNATURE_________________________________  NURSE SIGNATURE__________________________________  ________________________________________________________________________   Adam Phenix  An incentive spirometer is a tool that can help keep your lungs clear and active. This tool measures how well you are filling your lungs with each breath. Taking long deep breaths may help reverse or decrease the chance of developing breathing (pulmonary) problems (especially infection) following: A long period of time when you are unable to move or be active. BEFORE THE PROCEDURE  If the spirometer includes an indicator to show your best effort, your nurse or respiratory therapist will set it to a desired goal. If possible, sit up straight or lean slightly forward. Try  not to slouch. Hold the incentive spirometer in an upright position. INSTRUCTIONS FOR USE  Sit on the edge of your bed if possible, or sit up as far as you can in bed or on a  chair. Hold the incentive spirometer in an upright position. Breathe out normally. Place the mouthpiece in your mouth and seal your lips tightly around it. Breathe in slowly and as deeply as possible, raising the piston or the ball toward the top of the column. Hold your breath for 3-5 seconds or for as long as possible. Allow the piston or ball to fall to the bottom of the column. Remove the mouthpiece from your mouth and breathe out normally. Rest for a few seconds and repeat Steps 1 through 7 at least 10 times every 1-2 hours when you are awake. Take your time and take a few normal breaths between deep breaths. The spirometer may include an indicator to show your best effort. Use the indicator as a goal to work toward during each repetition. After each set of 10 deep breaths, practice coughing to be sure your lungs are clear. If you have an incision (the cut made at the time of surgery), support your incision when coughing by placing a pillow or rolled up towels firmly against it. Once you are able to get out of bed, walk around indoors and cough well. You may stop using the incentive spirometer when instructed by your caregiver.  RISKS AND COMPLICATIONS Take your time so you do not get dizzy or light-headed. If you are in pain, you may need to take or ask for pain medication before doing incentive spirometry. It is harder to take a deep breath if you are having pain. AFTER USE Rest and breathe slowly and easily. It can be helpful to keep track of a log of your progress. Your caregiver can provide you with a simple table to help with this. If you are using the spirometer at home, follow these instructions: [place] IF:  You are having difficultly using the spirometer. You have trouble using the spirometer as often as instructed. Your pain medication is not giving enough relief while using the spirometer. You develop fever of 100.5 F (38.1 C) or higher. SEEK IMMEDIATE MEDICAL CARE  IF:  You cough up bloody sputum that had not been present before. You develop fever of 102 F (38.9 C) or greater. You develop worsening pain at or near the incision site. MAKE SURE YOU:  Understand these instructions. Will watch your condition. Will get help right away if you are not doing well or get worse. Document Released: 10/24/2006 Document Revised: 09/05/2011 Document Reviewed: 12/25/2006 [facility] Patient Information 2014 [place], [place].   ________________________________________________________________________

## 2021-04-29 NOTE — Telephone Encounter (Signed)
I have not seen this form.  [name] [name]. [name], MD 04/29/2021 8:08 AM

## 2021-04-30 ENCOUNTER — Other Ambulatory Visit: Payer: Self-pay

## 2021-04-30 ENCOUNTER — Encounter (HOSPITAL_COMMUNITY)
Admission: RE | Admit: 2021-04-30 | Discharge: 2021-04-30 | Disposition: A | Discharge status: Home / Self Care | Payer: Medicare Other | Source: Ambulatory Visit | Attending: Orthopedic Surgery | Admitting: Orthopedic Surgery

## 2021-04-30 ENCOUNTER — Encounter: Payer: Self-pay | Admitting: Family Medicine

## 2021-04-30 ENCOUNTER — Encounter (HOSPITAL_COMMUNITY): Payer: Self-pay

## 2021-04-30 VITALS — BP 134/87 | HR 56 | Temp 98.0°F | Resp 20 | Ht 68.75 in | Wt 185.0 lb

## 2021-04-30 DIAGNOSIS — Z01818 Encounter for other preprocedural examination: Secondary | ICD-10-CM

## 2021-04-30 DIAGNOSIS — E1165 Type 2 diabetes mellitus with hyperglycemia: Secondary | ICD-10-CM | POA: Diagnosis not present

## 2021-04-30 DIAGNOSIS — Z01812 Encounter for preprocedural laboratory examination: Secondary | ICD-10-CM | POA: Insufficient documentation

## 2021-04-30 DIAGNOSIS — M1711 Unilateral primary osteoarthritis, right knee: Secondary | ICD-10-CM | POA: Insufficient documentation

## 2021-04-30 DIAGNOSIS — I119 Hypertensive heart disease without heart failure: Secondary | ICD-10-CM | POA: Diagnosis not present

## 2021-04-30 DIAGNOSIS — Z7902 Long term (current) use of antithrombotics/antiplatelets: Secondary | ICD-10-CM | POA: Insufficient documentation

## 2021-04-30 DIAGNOSIS — I2510 Atherosclerotic heart disease of native coronary artery without angina pectoris: Secondary | ICD-10-CM | POA: Diagnosis not present

## 2021-04-30 DIAGNOSIS — K219 Gastro-esophageal reflux disease without esophagitis: Secondary | ICD-10-CM | POA: Diagnosis not present

## 2021-04-30 LAB — CBC
HCT: 44.4 % (ref 39.0–52.0)
Hemoglobin: 15 g/dL (ref 13.0–17.0)
MCH: 32.8 pg (ref 26.0–34.0)
MCHC: 33.8 g/dL (ref 30.0–36.0)
MCV: 97.2 fL (ref 80.0–100.0)
Platelets: 230 10*3/uL (ref 150–400)
RBC: 4.57 MIL/uL (ref 4.22–5.81)
RDW: 12.7 % (ref 11.5–15.5)
WBC: 7.7 10*3/uL (ref 4.0–10.5)
nRBC: 0 % (ref 0.0–0.2)

## 2021-04-30 LAB — COMPREHENSIVE METABOLIC PANEL
ALT: 23 U/L (ref 0–44)
AST: 17 U/L (ref 15–41)
Albumin: 4.1 g/dL (ref 3.5–5.0)
Alkaline Phosphatase: 86 U/L (ref 38–126)
Anion gap: 8 (ref 5–15)
BUN: 15 mg/dL (ref 8–23)
CO2: 25 mmol/L (ref 22–32)
Calcium: 9.2 mg/dL (ref 8.9–10.3)
Chloride: 105 mmol/L (ref 98–111)
Creatinine, Ser: 0.85 mg/dL (ref 0.61–1.24)
GFR, Estimated: >60 mL/min (ref >60)
Glucose, Bld: 208 mg/dL — ABNORMAL HIGH (ref 70–99)
Potassium: 3.9 mmol/L (ref 3.5–5.1)
Sodium: 138 mmol/L (ref 135–145)
Total Bilirubin: 0.9 mg/dL (ref 0.3–1.2)
Total Protein: 6.8 g/dL (ref 6.5–8.1)

## 2021-04-30 LAB — SURGICAL PCR SCREEN
MRSA, PCR: NEGATIVE
Staphylococcus aureus: NEGATIVE

## 2021-04-30 LAB — GLUCOSE, CAPILLARY: Glucose-Capillary: 195 mg/dL — ABNORMAL HIGH (ref 70–99)

## 2021-04-30 LAB — TYPE AND SCREEN
ABO/RH(D): O POS
Antibody Screen: NEGATIVE

## 2021-04-30 LAB — HEMOGLOBIN A1C
Hgb A1c MFr Bld: 7.3 % — ABNORMAL HIGH (ref 4.8–5.6)
Mean Plasma Glucose: 162.81 mg/dL

## 2021-04-30 NOTE — Progress Notes (Addendum)
COVD test- 05/07/21   PCP - Dr. Zack Seal Cardiologist - Dr. Volanda Napoleon  Chest x-ray - no EKG - 09/18/20-epic Stress Test - 09/23/20-epic ECHO - no Cardiac Cath - 2007 and with 2 stents 2015 Pacemaker/ICD device last checked:NA  Sleep Study - no CPAP - no  Fasting Blood Sugar - Pt doesn't test Checks Blood Sugar _____ times a day  Blood Thinner Instructions:Plavix/ Dr. Lonia Blood Aspirin Instructions:Stop 5 days prior to DOS/ Dr. Alvan Dame Last Dose:05/05/21  Anesthesia review: yes  Patient denies shortness of breath, fever, cough and chest pain at PAT appointment Pt has no SOB with any activities  Patient verbalized understanding of instructions that were given to them at the PAT appointment. Patient was also instructed that they will need to review over the PAT instructions again at home before surgery. Yes Pt's Glucose was elevated at PAT. Pt said he has been eating halloween candy. I told hime to be very strict with his diet from now until surgery day.

## 2021-04-30 NOTE — Telephone Encounter (Signed)
Received paperwork from [facility] placed on stella desk.

## 2021-05-03 NOTE — Telephone Encounter (Signed)
Form faxed to [facility] att Derl Barrow

## 2021-05-03 NOTE — Progress Notes (Signed)
Anesthesia Chart Review   Case: [id number] Date/Time: 05/11/21 1240   Procedure: TOTAL KNEE ARTHROPLASTY (Right: Knee)   Anesthesia type: Spinal   Pre-op diagnosis: Right knee osteoarthritis   Location: [place] [facility] / [facility] ORS   Surgeons: Paralee Cancel, MD       DISCUSSION:70 y.o. never smoker with h/o GERD, HTN, DM II (A1C 7.3), CAD (DES to LAD 2015), right knee OA scheduled for above procedure 05/11/2021 with Dr. Paralee Cancel.   Per cardiology preoperative evaluation 03/23/2021, "Chart reviewed as part of pre-operative protocol coverage. Given past medical history and time since last visit, based on ACC/AHA guidelines, [name] [name] would be at acceptable risk for the planned procedure without further cardiovascular testing.    Case discussed with Dr. Martinique who feels that it will be acceptable to hold his Plavix prior to procedure then resume once stable from a bleeding standpoint thereafter. He was last seen by Dr. Martinique and underwent a Lexiscan stress test in anticipation of this surgery which was low risk."  Advised to hold Plavix 5 days prior to procedure.   Anticipate pt can proceed with planned procedure barring acute status change.   VS: BP 134/87   Pulse (!) 56   Temp 36.7 C (Oral)   Resp 20   Ht 5' 8.75" (1.746 m)   Wt 83.9 kg   SpO2 100%   BMI 27.52 kg/m   PROVIDERS: Vivi Barrack, MD is PCP   Martinique, Peter, MD is Cardiologist  LABS: Labs reviewed: Acceptable for surgery. (all labs ordered are listed, but only abnormal results are displayed)  Labs Reviewed  COMPREHENSIVE METABOLIC PANEL - Abnormal; Notable for the following components:      Result Value   Glucose, Bld 208 (*)    All other components within normal limits  HEMOGLOBIN A1C - Abnormal; Notable for the following components:   Hgb A1c MFr Bld 7.3 (*)    All other components within normal limits  GLUCOSE, CAPILLARY - Abnormal; Notable for the following components:   Glucose-Capillary 195 (*)    All  other components within normal limits  SURGICAL PCR SCREEN  CBC  TYPE AND SCREEN     IMAGES:   EKG: 09/17/2019 Rate 56 bpm  Sinus bradycardia   CV: Stress Test 09/23/2020 The left ventricular ejection fraction is mildly decreased (45-54%). Nuclear stress EF: 49%. Mid anterior wall hypokinesis. There was no ST segment deviation noted during stress. Defect 1: There is a medium defect of severe severity present in the mid anterior location. Findings consistent with prior myocardial infarction. This is an intermediate risk study. Old infarct pattern noted in the mid inferior wall distribution. Prior LAD stent placed. No ischemia identified. Past Medical History:  Diagnosis Date   Adenomatous colon polyp 09/07/2004   Amblyopia    CAD (coronary artery disease)    cardiac CT with a 50% LAD lesion in 2007   Diverticulosis    DM2 (diabetes mellitus, type 2) (HCC)    Elevated LFTs    secondary to labetalol   Esophageal stricture 2003   External hemorrhoids 2012   lanced by Dr Rise Patience   Family history of anesthesia complication    "sister had PONV"   GERD (gastroesophageal reflux disease)    esophageal stricture, pmh   Hiatal hernia 01/04/2000   History of kidney stones 2022   HLD (hyperlipidemia)    HTN (hypertension)    Hypopotassemia     Past Surgical History:  Procedure Laterality Date  COLONOSCOPY  06/27/2004   tics , ADENOMATOUS polyps. F/U due 2011   COLONOSCOPY  06/27/2010   Tics   CORONARY ANGIOPLASTY WITH STENT PLACEMENT  04/01/2014   "2"   ESOPHAGEAL DILATION  06/27/2001   ESOPHAGOGASTRODUODENOSCOPY  06/27/2001   ERD, stricture dilated   EXCISIONAL HEMORRHOIDECTOMY  ~ 2008   LEFT HEART CATHETERIZATION WITH CORONARY ANGIOGRAM N/A 03/26/2014   Procedure: LEFT HEART CATHETERIZATION WITH CORONARY ANGIOGRAM;  Surgeon: Peter M Martinique, MD;  Location: [facility] CATH LAB;  Service: Cardiovascular;  Laterality: N/A;   PERCUTANEOUS CORONARY STENT INTERVENTION (PCI-S) N/A  04/01/2014   Procedure: PERCUTANEOUS CORONARY STENT INTERVENTION (PCI-S);  Surgeon: Peter M Martinique, MD;  Location: [facility] CATH LAB;  Service: Cardiovascular;  Laterality: N/A;   POSTERIOR LAMINECTOMY / DECOMPRESSION LUMBAR SPINE  12/25/2009   L4 , Dr Sherwood Gambler   SHOULDER ARTHROSCOPY WITH ROTATOR CUFF REPAIR AND OPEN BICEPS TENODESIS Left 02/04/2019   Procedure: LEFT SHOULDER ARTHROSCOPY, BICEPS TENODESIS, MINI OPEN ROTATOR CUFF TEAR REPAIR;  Surgeon: Meredith Pel, MD;  Location: [place];  Service: Orthopedics;  Laterality: Left;    MEDICATIONS:  acetaminophen (TYLENOL) 500 MG tablet   atorvastatin (LIPITOR) 80 MG tablet   carvedilol (COREG) 3.125 MG tablet   clopidogrel (PLAVIX) 75 MG tablet   Coenzyme Q10 200 MG capsule   famotidine-calcium carbonate-magnesium hydroxide (PEPCID COMPLETE) 10-800-165 MG chewable tablet   glucose blood (ONE TOUCH ULTRA TEST) test strip   Homeopathic Products (ZICAM ALLERGY RELIEF NA)   irbesartan (AVAPRO) 300 MG tablet   metFORMIN (GLUCOPHAGE-XR) 500 MG 24 hr tablet   Misc Natural Products (GLUCOSAMINE CHOND COMPLEX/MSM) TABS   Multiple Vitamins-Minerals (MULTIVITAMIN WITH MINERALS) tablet   nitroGLYCERIN (NITROSTAT) 0.4 MG SL tablet   PROTEIN PO   sildenafil (REVATIO) 20 MG tablet   spironolactone (ALDACTONE) 25 MG tablet   Undecylenic Ac-Zn Undecylenate (FUNGI-NAIL TOE & FOOT EX)   vitamin B-12 (CYANOCOBALAMIN) 1000 MCG tablet   No current facility-administered medications for this encounter.    Konrad Felix Ward, PA-C [facility] Pre-Surgical Testing [phone]

## 2021-05-03 NOTE — Anesthesia Preprocedure Evaluation (Addendum)
Anesthesia Evaluation  Patient identified by MRN, date of birth, ID band Patient awake    Reviewed: Allergy & Precautions, NPO status , Patient's Chart, lab work & pertinent test results  Airway Mallampati: II  TM Distance: >3 FB Neck ROM: Full    Dental  (+) Dental Advisory Given, Teeth Intact   Pulmonary neg pulmonary ROS,    Pulmonary exam normal breath sounds clear to auscultation       Cardiovascular hypertension, Pt. on home beta blockers and Pt. on medications + CAD and + Cardiac Stents  Normal cardiovascular exam Rhythm:Regular Rate:Normal  NM Stress 08/2020 The left ventricular ejection fraction is mildly decreased (45-54%). Nuclear stress EF: 49%. Mid anterior wall hypokinesis. There was no ST segment deviation noted during stress. Defect 1: There is a medium defect of severe severity present in the mid anterior location. Findings consistent with prior myocardial infarction. This is an intermediate risk study. Old infarct pattern noted in the mid inferior wall distribution. Prior LAD stent placed. No ischemia identified.   Neuro/Psych negative neurological ROS     GI/Hepatic Neg liver ROS, hiatal hernia, GERD  ,  Endo/Other  diabetes  Renal/GU Renal disease     Musculoskeletal negative musculoskeletal ROS (+)   Abdominal (+) - obese,   Peds  Hematology negative hematology ROS (+)   Anesthesia Other Findings   Reproductive/Obstetrics                                                            Anesthesia Evaluation  Patient identified by MRN, date of birth, ID band Patient awake    Reviewed: Allergy & Precautions, H&P , NPO status , Patient's Chart, lab work & pertinent test results  Airway Mallampati: II   Neck ROM: full    Dental   Pulmonary neg pulmonary ROS,    breath sounds clear to auscultation       Cardiovascular hypertension, + CAD and + Cardiac Stents    Rhythm:regular Rate:Normal     Neuro/Psych    GI/Hepatic hiatal hernia, GERD  ,  Endo/Other  diabetes, Type 2  Renal/GU      Musculoskeletal   Abdominal   Peds  Hematology   Anesthesia Other Findings   Reproductive/Obstetrics                             Anesthesia Physical Anesthesia Plan  ASA: III  Anesthesia Plan: General   Post-op Pain Management:  Regional for Post-op pain   Induction: Intravenous  PONV Risk Score and Plan: 2 and Ondansetron, Dexamethasone, Midazolam and Treatment may vary due to age or medical condition  Airway Management Planned: Oral ETT  Additional Equipment:   Intra-op Plan:   Post-operative Plan: Extubation in OR  Informed Consent: I have reviewed the patients History and Physical, chart, labs and discussed the procedure including the risks, benefits and alternatives for the proposed anesthesia with the patient or authorized representative who has indicated his/her understanding and acceptance.       Plan Discussed with: CRNA, Anesthesiologist and Surgeon  Anesthesia Plan Comments:         Anesthesia Quick Evaluation  Anesthesia Physical Anesthesia Plan  ASA: 3  Anesthesia Plan: Spinal   Post-op Pain Management:  Induction: Intravenous  PONV Risk Score and Plan: 2 and Ondansetron, Dexamethasone, Propofol infusion, Treatment may vary due to age or medical condition and Midazolam  Airway Management Planned:   Additional Equipment: None  Intra-op Plan:   Post-operative Plan:   Informed Consent: I have reviewed the patients History and Physical, chart, labs and discussed the procedure including the risks, benefits and alternatives for the proposed anesthesia with the patient or authorized representative who has indicated his/her understanding and acceptance.     Dental advisory given  Plan Discussed with: CRNA  Anesthesia Plan Comments: (See PAT note 04/30/2021, Konrad Felix Ward, PA-C)      Anesthesia Quick Evaluation

## 2021-05-06 NOTE — H&P (Signed)
TOTAL KNEE ADMISSION H&P  Patient is being admitted for right total knee arthroplasty.  Subjective:  Chief Complaint:right knee pain.  HPI: [name] [name], 70 y.o. male, has a history of pain and functional disability in the right knee due to arthritis and has failed non-surgical conservative treatments for greater than 12 weeks to includeNSAID's and/or analgesics, corticosteriod injections, and activity modification.  Onset of symptoms was gradual, starting 3 years ago with gradually worsening course since that time. The patient noted no past surgery on the right knee(s).  Patient currently rates pain in the right knee(s) at 8 out of 10 with activity. Patient has worsening of pain with activity and weight bearing, pain that interferes with activities of daily living, and pain with passive range of motion.  Patient has evidence of joint space narrowing by imaging studies. There is no active infection.  Patient Active Problem List   Diagnosis Date Noted   Nephrolithiasis 03/05/2020   Labral tear of long head of biceps tendon, left, sequela    Complete tear of left rotator cuff    Diverticulosis of colon (without mention of hemorrhage) 04/25/2011   Adenomatous colon polyp 03/17/2011   LOW BACK PAIN, CHRONIC 12/13/2007   Hyperlipidemia associated with type 2 diabetes mellitus ([place]) 06/08/2007   T2DM (type 2 diabetes mellitus) ([place]) 02/27/2007   Hypertension associated with diabetes ([place]) 02/27/2007   AMBLYOPIA 12/06/2006   GERD 12/06/2006   Past Medical History:  Diagnosis Date   Adenomatous colon polyp 09/07/2004   Amblyopia    CAD (coronary artery disease)    cardiac CT with a 50% LAD lesion in 2007   Diverticulosis    DM2 (diabetes mellitus, type 2) (HCC)    Elevated LFTs    secondary to labetalol   Esophageal stricture 2003   External hemorrhoids 2012   lanced by Dr Rise Patience   Family history of anesthesia complication    "sister had PONV"   GERD (gastroesophageal reflux  disease)    esophageal stricture, pmh   Hiatal hernia 01/04/2000   History of kidney stones 2022   HLD (hyperlipidemia)    HTN (hypertension)    Hypopotassemia     Past Surgical History:  Procedure Laterality Date   COLONOSCOPY  06/27/2004   tics , ADENOMATOUS polyps. F/U due 2011   COLONOSCOPY  06/27/2010   Tics   CORONARY ANGIOPLASTY WITH STENT PLACEMENT  04/01/2014   "2"   ESOPHAGEAL DILATION  06/27/2001   ESOPHAGOGASTRODUODENOSCOPY  06/27/2001   ERD, stricture dilated   EXCISIONAL HEMORRHOIDECTOMY  ~ 2008   LEFT HEART CATHETERIZATION WITH CORONARY ANGIOGRAM N/A 03/26/2014   Procedure: LEFT HEART CATHETERIZATION WITH CORONARY ANGIOGRAM;  Surgeon: Peter M Martinique, MD;  Location[facility] C[facility] LAB;  Service: Cardiovascular;  Laterality: N/A;   PERCUTANEOUS CORONARY STENT INTERVENTION (PCI-S) N/A 04/01/2014   Procedure: PERCUTANEOUS CORONARY STENT INTERVENTION (PCI-S);  Surgeon: Peter M Martinique, MD;  Location: [facility] CATH LAB;  Service: Cardiovascular;  Laterality: N/A;   POSTERIOR LAMINECTOMY / DECOMPRESSION LUMBAR SPINE  12/25/2009   L4 , Dr Sherwood Gambler   SHOULDER ARTHROSCOPY WITH ROTATOR CUFF REPAIR AND OPEN BICEPS TENODESIS Left 02/04/2019   Procedure: LEFT SHOULDER ARTHROSCOPY, BICEPS TENODESIS, MINI OPEN ROTATOR CUFF TEAR REPAIR;  Surgeon: Meredith Pel, MD;  Location: [place];  Service: Orthopedics;  Laterality: Left;    No current facility-administered medications for this encounter.   Current Outpatient Medications  Medication Sig Dispense Refill Last Dose   acetaminophen (TYLENOL) 500 MG tablet Take 1,000  mg by mouth every 6 (six) hours as needed for moderate pain.      atorvastatin (LIPITOR) 80 MG tablet TAKE 1 TABLET DAILY AT     6:00PM 90 tablet 0    carvedilol (COREG) 3.125 MG tablet TAKE 1 TABLET TWICE DAILY  WITH MEALS. 180 tablet 0    clopidogrel (PLAVIX) 75 MG tablet TAKE 1 TABLET DAILY 90 tablet 0    Coenzyme Q10 200 MG capsule Take 200 mg by mouth  daily.      famotidine-calcium carbonate-magnesium hydroxide (PEPCID COMPLETE) 10-800-165 MG chewable tablet Chew 1 tablet by mouth daily as needed (heartburn).      Homeopathic Products (ZICAM ALLERGY RELIEF NA) Place 1 spray into the nose daily as needed (allergies).      irbesartan (AVAPRO) 300 MG tablet TAKE 1 TABLET DAILY. 90 tablet 0    metFORMIN (GLUCOPHAGE-XR) 500 MG 24 hr tablet Take 2 tablet (1000Mg  total) by mouth daily after supper 180 tablet 3    Misc Natural Products (GLUCOSAMINE CHOND COMPLEX/MSM) TABS Take 1 tablet by mouth daily.      Multiple Vitamins-Minerals (MULTIVITAMIN WITH MINERALS) tablet Take 1 tablet by mouth daily.      nitroGLYCERIN (NITROSTAT) 0.4 MG SL tablet Place 1 tablet (0.4 mg total) under the tongue every 5 (five) minutes as needed for chest pain. 100 tablet 1    PROTEIN PO Take 1 Container by mouth 2 (two) times a week.      sildenafil (REVATIO) 20 MG tablet Take 1-5 tablets by mouth daily as needed. 100 tablet 0    spironolactone (ALDACTONE) 25 MG tablet TAKE 1 TABLET DAILY 90 tablet 0    Undecylenic Ac-Zn Undecylenate (FUNGI-NAIL TOE & FOOT EX) Apply 1 application topically every other day.      vitamin B-12 (CYANOCOBALAMIN) 1000 MCG tablet Take 1,000 mcg by mouth daily.      glucose blood (ONE TOUCH ULTRA TEST) test strip Use one strip per test. Test blood sugars 1-4 times daily as instructed. 25 each 5    Allergies  Allergen Reactions   Aspirin Hives    Angioedema   Other Hives   Oxycodone Nausea Only   Contrast Media [Iodinated Diagnostic Agents] Itching and Rash    Developed hives despite pre treatment   Norvasc [Amlodipine Besylate] Cough    09/12/2013 gingival hyperplasia , Dr Geralynn Ochs ,DDS    Social History   Tobacco Use   Smoking status: Never   Smokeless tobacco: Never  Substance Use Topics   Alcohol use: Yes    Alcohol/week: 2.0 standard drinks    Types: 2 Cans of beer per week    Comment: social    Family History  Problem Relation  Age of Onset   Heart attack Mother 40   Stroke Mother 19   Prostate cancer Father    Diabetes Father    Heart disease Father 56       CBAG    Diabetes Brother    Heart attack Brother    Breast cancer Maternal Aunt    Heart attack Maternal Grandfather 61   Diabetes Paternal Grandmother    Stroke Paternal Grandfather 33   Heart disease Sister      Review of Systems  Constitutional:  Negative for chills and fever.  Respiratory:  Negative for cough and shortness of breath.   Cardiovascular:  Negative for chest pain.  Gastrointestinal:  Negative for nausea and vomiting.  Musculoskeletal:  Positive for arthralgias.    Objective:  Physical  Exam Well nourished and well developed. General: Alert and oriented x3, cooperative and pleasant, no acute distress. Head: normocephalic, atraumatic, neck supple. Eyes: EOMI.  Musculoskeletal: Right knee exam: No palpable effusion, warmth erythema Slight flexion contracture Flexion to 120 degrees with tightness and mild crepitation anteriorly Tenderness over the medial and anterior aspect of the knee. Neurovascular intact.  Calves soft and nontender. Motor function intact in LE. Strength 5/5 LE bilaterally. Neuro: Distal pulses 2+. Sensation to light touch intact in LE.  Vital signs in last 24 hours:    Labs:   Estimated body mass index is 27.52 kg/m as calculated from the following:   Height as of 04/30/21: 5' 8.75" (1.746 m).   Weight as of 04/30/21: 83.9 kg.   Imaging Review Plain radiographs demonstrate severe degenerative joint disease of the right knee(s). The overall alignment isneutral. The bone quality appears to be adequate for age and reported activity level.      Assessment/Plan:  End stage arthritis, right knee   The patient history, physical examination, clinical judgment of the provider and imaging studies are consistent with end stage degenerative joint disease of the right knee(s) and total knee arthroplasty  is deemed medically necessary. The treatment options including medical management, injection therapy arthroscopy and arthroplasty were discussed at length. The risks and benefits of total knee arthroplasty were presented and reviewed. The risks due to aseptic loosening, infection, stiffness, patella tracking problems, thromboembolic complications and other imponderables were discussed. The patient acknowledged the explanation, agreed to proceed with the plan and consent was signed. Patient is being admitted for inpatient treatment for surgery, pain control, PT, OT, prophylactic antibiotics, VTE prophylaxis, progressive ambulation and ADL's and discharge planning. The patient is planning to be discharged  home.   Therapy Plans: outpatient therapy at [facility] Disposition: Home with sister Planned DVT Prophylaxis: Plavix 75 mg daily DME needed: none PCP: Dr. [name], Cardiologist: Dr. [name], clearance received (stents) TXA: IV Allergies: iodine (unsure if problems with betadine) - Hives, aspirin (childhood)- swelling/itchy, oxycodone - nausea, norvasc - cough Anesthesia Concerns: none BMI: 27.5 Last HgbA1c: 7.0%  Other: - Norco, robaxin, tylenol  Patient's anticipated LOS is less than 2 midnights, meeting these requirements: - Younger than 11 - Lives within 1 hour of care - Has a competent adult at home to recover with post-op recover - NO history of  - Chronic pain requiring opiods  - Diabetes  - Coronary Artery Disease  - Heart failure  - Heart attack  - Stroke  - DVT/VTE  - Cardiac arrhythmia  - Respiratory Failure/COPD  - Renal failure  - Anemia  - Advanced Liver disease  Griffith Citron, PA-C Orthopedic Surgery [facility] [place] [phone]

## 2021-05-07 ENCOUNTER — Other Ambulatory Visit: Payer: Self-pay | Admitting: Orthopedic Surgery

## 2021-05-07 LAB — SARS CORONAVIRUS 2 (TAT 6-24 HRS): SARS Coronavirus 2: NEGATIVE

## 2021-05-11 ENCOUNTER — Encounter (HOSPITAL_COMMUNITY): Payer: Self-pay | Admitting: Orthopedic Surgery

## 2021-05-11 ENCOUNTER — Ambulatory Visit (HOSPITAL_COMMUNITY): Payer: Medicare Other | Admitting: Physician Assistant

## 2021-05-11 ENCOUNTER — Encounter (HOSPITAL_COMMUNITY)
Admission: RE | Disposition: A | Discharge status: Home / Self Care | Payer: Self-pay | Source: Home / Self Care | Attending: Orthopedic Surgery

## 2021-05-11 ENCOUNTER — Other Ambulatory Visit: Payer: Self-pay

## 2021-05-11 ENCOUNTER — Observation Stay (HOSPITAL_COMMUNITY)
Admission: RE | Admit: 2021-05-11 | Discharge: 2021-05-13 | Disposition: A | Discharge status: Home / Self Care | Payer: Medicare Other | Attending: Orthopedic Surgery | Admitting: Orthopedic Surgery

## 2021-05-11 ENCOUNTER — Ambulatory Visit (HOSPITAL_COMMUNITY): Payer: Medicare Other | Admitting: Anesthesiology

## 2021-05-11 DIAGNOSIS — I2510 Atherosclerotic heart disease of native coronary artery without angina pectoris: Secondary | ICD-10-CM | POA: Diagnosis not present

## 2021-05-11 DIAGNOSIS — Z79899 Other long term (current) drug therapy: Secondary | ICD-10-CM | POA: Diagnosis not present

## 2021-05-11 DIAGNOSIS — M1711 Unilateral primary osteoarthritis, right knee: Principal | ICD-10-CM | POA: Insufficient documentation

## 2021-05-11 DIAGNOSIS — M659 Synovitis and tenosynovitis, unspecified: Secondary | ICD-10-CM | POA: Diagnosis not present

## 2021-05-11 DIAGNOSIS — E119 Type 2 diabetes mellitus without complications: Secondary | ICD-10-CM | POA: Diagnosis not present

## 2021-05-11 DIAGNOSIS — M25461 Effusion, right knee: Secondary | ICD-10-CM | POA: Diagnosis not present

## 2021-05-11 DIAGNOSIS — M25761 Osteophyte, right knee: Secondary | ICD-10-CM | POA: Diagnosis not present

## 2021-05-11 DIAGNOSIS — Z96651 Presence of right artificial knee joint: Secondary | ICD-10-CM

## 2021-05-11 DIAGNOSIS — G8918 Other acute postprocedural pain: Secondary | ICD-10-CM | POA: Diagnosis not present

## 2021-05-11 DIAGNOSIS — I10 Essential (primary) hypertension: Secondary | ICD-10-CM | POA: Diagnosis not present

## 2021-05-11 HISTORY — PX: TOTAL KNEE ARTHROPLASTY: SHX125

## 2021-05-11 LAB — ABO/RH: ABO/RH(D): O POS

## 2021-05-11 LAB — GLUCOSE, CAPILLARY
Glucose-Capillary: 135 mg/dL — ABNORMAL HIGH (ref 70–99)
Glucose-Capillary: 138 mg/dL — ABNORMAL HIGH (ref 70–99)
Glucose-Capillary: 316 mg/dL — ABNORMAL HIGH (ref 70–99)

## 2021-05-11 SURGERY — ARTHROPLASTY, KNEE, TOTAL
Anesthesia: Spinal | Site: Knee | Laterality: Right

## 2021-05-11 MED ORDER — MEPERIDINE HCL 50 MG/ML IJ SOLN
6.2500 mg | INTRAMUSCULAR | Status: DC | PRN
Start: 2021-05-11 — End: 2021-05-11

## 2021-05-11 MED ORDER — PROMETHAZINE HCL 25 MG/ML IJ SOLN: 6.2500 mg | INTRAMUSCULAR | Status: DC | PRN

## 2021-05-11 MED ORDER — LACTATED RINGERS IV SOLN: INTRAVENOUS | Status: DC

## 2021-05-11 MED ORDER — CHLORHEXIDINE GLUCONATE 0.12 % MT SOLN
15.0000 mL | Freq: Once | OROMUCOSAL | Status: AC
  Administered 2021-05-11: 15 mL via OROMUCOSAL

## 2021-05-11 MED ORDER — DOCUSATE SODIUM 100 MG PO CAPS
100.0000 mg | ORAL_CAPSULE | Freq: Two times a day (BID) | ORAL | Status: DC
  Administered 2021-05-11 – 2021-05-13 (×4): 100 mg via ORAL
  Filled 2021-05-11 (×4): qty 1

## 2021-05-11 MED ORDER — BISACODYL 10 MG RE SUPP: 10.0000 mg | Freq: Every day | RECTAL | Status: DC | PRN

## 2021-05-11 MED ORDER — MORPHINE SULFATE (PF) 2 MG/ML IV SOLN: 0.5000 mg | INTRAVENOUS | Status: DC | PRN

## 2021-05-11 MED ORDER — ORAL CARE MOUTH RINSE: 15.0000 mL | Freq: Once | OROMUCOSAL | Status: AC

## 2021-05-11 MED ORDER — BUPIVACAINE IN DEXTROSE 0.75-8.25 % IT SOLN
INTRATHECAL | Status: DC | PRN
  Administered 2021-05-11: 1.8 mL via INTRATHECAL

## 2021-05-11 MED ORDER — ONDANSETRON HCL 4 MG/2ML IJ SOLN
INTRAMUSCULAR | Status: AC
  Filled 2021-05-11: qty 2

## 2021-05-11 MED ORDER — IRBESARTAN 150 MG PO TABS
300.0000 mg | ORAL_TABLET | Freq: Every day | ORAL | Status: DC
  Administered 2021-05-12: 300 mg via ORAL
  Filled 2021-05-11 (×2): qty 2

## 2021-05-11 MED ORDER — CEFAZOLIN SODIUM-DEXTROSE 2-4 GM/100ML-% IV SOLN
2.0000 g | Freq: Four times a day (QID) | INTRAVENOUS | Status: AC
  Administered 2021-05-11 – 2021-05-12 (×2): 2 g via INTRAVENOUS
  Filled 2021-05-11 (×2): qty 100

## 2021-05-11 MED ORDER — ONDANSETRON HCL 4 MG/2ML IJ SOLN
INTRAMUSCULAR | Status: DC | PRN
  Administered 2021-05-11: 4 mg via INTRAVENOUS

## 2021-05-11 MED ORDER — CLONIDINE HCL (ANALGESIA) 100 MCG/ML EP SOLN
EPIDURAL | Status: DC | PRN
  Administered 2021-05-11: 80 ug

## 2021-05-11 MED ORDER — DEXAMETHASONE SODIUM PHOSPHATE 10 MG/ML IJ SOLN
10.0000 mg | Freq: Once | INTRAMUSCULAR | Status: AC
  Administered 2021-05-12: 10 mg via INTRAVENOUS
  Filled 2021-05-11: qty 1

## 2021-05-11 MED ORDER — PHENOL 1.4 % MT LIQD: 1.0000 | OROMUCOSAL | Status: DC | PRN

## 2021-05-11 MED ORDER — SODIUM CHLORIDE 0.9 % IR SOLN
Status: DC | PRN
  Administered 2021-05-11: 1000 mL

## 2021-05-11 MED ORDER — BUPIVACAINE-EPINEPHRINE (PF) 0.25% -1:200000 IJ SOLN
INTRAMUSCULAR | Status: DC | PRN
  Administered 2021-05-11: 30 mL

## 2021-05-11 MED ORDER — SPIRONOLACTONE 25 MG PO TABS
25.0000 mg | ORAL_TABLET | Freq: Every day | ORAL | Status: DC
  Administered 2021-05-12 – 2021-05-13 (×2): 25 mg via ORAL
  Filled 2021-05-11 (×2): qty 1

## 2021-05-11 MED ORDER — KETOROLAC TROMETHAMINE 30 MG/ML IJ SOLN
INTRAMUSCULAR | Status: DC | PRN
  Administered 2021-05-11: 30 mg

## 2021-05-11 MED ORDER — METOCLOPRAMIDE HCL 5 MG/ML IJ SOLN: 5.0000 mg | Freq: Three times a day (TID) | INTRAMUSCULAR | Status: DC | PRN

## 2021-05-11 MED ORDER — CARVEDILOL 3.125 MG PO TABS
3.1250 mg | ORAL_TABLET | Freq: Two times a day (BID) | ORAL | Status: DC
  Administered 2021-05-11 – 2021-05-12 (×3): 3.125 mg via ORAL
  Filled 2021-05-11 (×4): qty 1

## 2021-05-11 MED ORDER — POLYETHYLENE GLYCOL 3350 17 G PO PACK: 17.0000 g | PACK | Freq: Every day | ORAL | Status: DC | PRN

## 2021-05-11 MED ORDER — DIPHENHYDRAMINE HCL 12.5 MG/5ML PO ELIX: 12.5000 mg | ORAL_SOLUTION | ORAL | Status: DC | PRN

## 2021-05-11 MED ORDER — HYDROMORPHONE HCL 1 MG/ML IJ SOLN: 0.2500 mg | INTRAMUSCULAR | Status: DC | PRN

## 2021-05-11 MED ORDER — DEXAMETHASONE SODIUM PHOSPHATE 4 MG/ML IJ SOLN
INTRAMUSCULAR | Status: DC | PRN
  Administered 2021-05-11: 5 mg via PERINEURAL

## 2021-05-11 MED ORDER — PROPOFOL 500 MG/50ML IV EMUL
INTRAVENOUS | Status: DC | PRN
  Administered 2021-05-11: 80 ug/kg/min via INTRAVENOUS

## 2021-05-11 MED ORDER — MENTHOL 3 MG MT LOZG: 1.0000 | LOZENGE | OROMUCOSAL | Status: DC | PRN

## 2021-05-11 MED ORDER — ACETAMINOPHEN 325 MG PO TABS: 325.0000 mg | ORAL_TABLET | Freq: Four times a day (QID) | ORAL | Status: DC | PRN

## 2021-05-11 MED ORDER — POVIDONE-IODINE 10 % EX SWAB
2.0000 "application " | Freq: Once | CUTANEOUS | Status: AC
  Administered 2021-05-11: 2 via TOPICAL

## 2021-05-11 MED ORDER — DEXAMETHASONE SODIUM PHOSPHATE 10 MG/ML IJ SOLN
8.0000 mg | Freq: Once | INTRAMUSCULAR | Status: AC
  Administered 2021-05-11: 8 mg via INTRAVENOUS

## 2021-05-11 MED ORDER — PROPOFOL 10 MG/ML IV BOLUS
INTRAVENOUS | Status: DC | PRN
  Administered 2021-05-11: 30 mg via INTRAVENOUS
  Administered 2021-05-11: 20 mg via INTRAVENOUS

## 2021-05-11 MED ORDER — MIDAZOLAM HCL 2 MG/2ML IJ SOLN
2.0000 mg | Freq: Once | INTRAMUSCULAR | Status: AC
  Administered 2021-05-11: 1 mg via INTRAVENOUS
  Filled 2021-05-11: qty 2

## 2021-05-11 MED ORDER — TRANEXAMIC ACID-NACL 1000-0.7 MG/100ML-% IV SOLN
1000.0000 mg | INTRAVENOUS | Status: AC
  Administered 2021-05-11: 1000 mg via INTRAVENOUS
  Filled 2021-05-11: qty 100

## 2021-05-11 MED ORDER — SODIUM CHLORIDE (PF) 0.9 % IJ SOLN
INTRAMUSCULAR | Status: DC | PRN
  Administered 2021-05-11: 30 mL

## 2021-05-11 MED ORDER — ATORVASTATIN CALCIUM 40 MG PO TABS
80.0000 mg | ORAL_TABLET | Freq: Every day | ORAL | Status: DC
  Administered 2021-05-11 – 2021-05-12 (×2): 80 mg via ORAL
  Filled 2021-05-11 (×2): qty 2

## 2021-05-11 MED ORDER — INSULIN ASPART 100 UNIT/ML IJ SOLN
0.0000 [IU] | Freq: Three times a day (TID) | INTRAMUSCULAR | Status: DC
  Administered 2021-05-12: 3 [IU] via SUBCUTANEOUS
  Administered 2021-05-12: 5 [IU] via SUBCUTANEOUS
  Administered 2021-05-12: 3 [IU] via SUBCUTANEOUS
  Administered 2021-05-13: 13:00:00 2 [IU] via SUBCUTANEOUS

## 2021-05-11 MED ORDER — CLOPIDOGREL BISULFATE 75 MG PO TABS
75.0000 mg | ORAL_TABLET | Freq: Every day | ORAL | Status: DC
  Administered 2021-05-12 – 2021-05-13 (×2): 75 mg via ORAL
  Filled 2021-05-11 (×2): qty 1

## 2021-05-11 MED ORDER — ROPIVACAINE HCL 5 MG/ML IJ SOLN
INTRAMUSCULAR | Status: DC | PRN
  Administered 2021-05-11: 30 mL via PERINEURAL

## 2021-05-11 MED ORDER — METHOCARBAMOL 500 MG PO TABS
500.0000 mg | ORAL_TABLET | Freq: Four times a day (QID) | ORAL | Status: DC | PRN
  Administered 2021-05-13: 09:00:00 500 mg via ORAL
  Filled 2021-05-11: qty 1

## 2021-05-11 MED ORDER — TRANEXAMIC ACID-NACL 1000-0.7 MG/100ML-% IV SOLN
1000.0000 mg | Freq: Once | INTRAVENOUS | Status: AC
  Administered 2021-05-11: 1000 mg via INTRAVENOUS
  Filled 2021-05-11: qty 100

## 2021-05-11 MED ORDER — HYDROCODONE-ACETAMINOPHEN 7.5-325 MG PO TABS
1.0000 | ORAL_TABLET | ORAL | Status: DC | PRN
  Administered 2021-05-12 – 2021-05-13 (×3): 2 via ORAL
  Filled 2021-05-11 (×3): qty 2

## 2021-05-11 MED ORDER — SODIUM CHLORIDE 0.9 % IV SOLN: INTRAVENOUS | Status: DC

## 2021-05-11 MED ORDER — ONDANSETRON HCL 4 MG/2ML IJ SOLN
4.0000 mg | Freq: Four times a day (QID) | INTRAMUSCULAR | Status: DC | PRN
  Administered 2021-05-12: 4 mg via INTRAVENOUS
  Filled 2021-05-11: qty 2

## 2021-05-11 MED ORDER — METHOCARBAMOL 500 MG IVPB - SIMPLE MED
500.0000 mg | Freq: Four times a day (QID) | INTRAVENOUS | Status: DC | PRN
  Filled 2021-05-11: qty 50

## 2021-05-11 MED ORDER — FERROUS SULFATE 325 (65 FE) MG PO TABS
325.0000 mg | ORAL_TABLET | Freq: Three times a day (TID) | ORAL | Status: DC
  Administered 2021-05-12 – 2021-05-13 (×5): 325 mg via ORAL
  Filled 2021-05-11 (×5): qty 1

## 2021-05-11 MED ORDER — EPHEDRINE 5 MG/ML INJ
INTRAVENOUS | Status: AC
  Filled 2021-05-11: qty 5

## 2021-05-11 MED ORDER — DEXAMETHASONE SODIUM PHOSPHATE 10 MG/ML IJ SOLN
INTRAMUSCULAR | Status: AC
  Filled 2021-05-11: qty 1

## 2021-05-11 MED ORDER — METFORMIN HCL ER 500 MG PO TB24
1000.0000 mg | ORAL_TABLET | Freq: Every day | ORAL | Status: DC
  Administered 2021-05-12: 1000 mg via ORAL
  Filled 2021-05-11: qty 2

## 2021-05-11 MED ORDER — ONDANSETRON HCL 4 MG PO TABS: 4.0000 mg | ORAL_TABLET | Freq: Four times a day (QID) | ORAL | Status: DC | PRN

## 2021-05-11 MED ORDER — BUPIVACAINE-EPINEPHRINE (PF) 0.25% -1:200000 IJ SOLN
INTRAMUSCULAR | Status: AC
  Filled 2021-05-11: qty 30

## 2021-05-11 MED ORDER — HYDROCODONE-ACETAMINOPHEN 5-325 MG PO TABS
1.0000 | ORAL_TABLET | ORAL | Status: DC | PRN
  Administered 2021-05-12 – 2021-05-13 (×5): 2 via ORAL
  Filled 2021-05-11 (×5): qty 2

## 2021-05-11 MED ORDER — FENTANYL CITRATE PF 50 MCG/ML IJ SOSY
100.0000 ug | PREFILLED_SYRINGE | Freq: Once | INTRAMUSCULAR | Status: AC
  Administered 2021-05-11: 50 ug via INTRAVENOUS
  Filled 2021-05-11: qty 2

## 2021-05-11 MED ORDER — EPHEDRINE SULFATE-NACL 50-0.9 MG/10ML-% IV SOSY
PREFILLED_SYRINGE | INTRAVENOUS | Status: DC | PRN
  Administered 2021-05-11 (×2): 5 mg via INTRAVENOUS

## 2021-05-11 MED ORDER — CEFAZOLIN SODIUM-DEXTROSE 2-4 GM/100ML-% IV SOLN
2.0000 g | INTRAVENOUS | Status: AC
  Administered 2021-05-11: 2 g via INTRAVENOUS
  Filled 2021-05-11: qty 100

## 2021-05-11 MED ORDER — SODIUM CHLORIDE (PF) 0.9 % IJ SOLN
INTRAMUSCULAR | Status: AC
  Filled 2021-05-11: qty 30

## 2021-05-11 MED ORDER — KETOROLAC TROMETHAMINE 30 MG/ML IJ SOLN
INTRAMUSCULAR | Status: AC
  Filled 2021-05-11: qty 1

## 2021-05-11 MED ORDER — METOCLOPRAMIDE HCL 5 MG PO TABS: 5.0000 mg | ORAL_TABLET | Freq: Three times a day (TID) | ORAL | Status: DC | PRN

## 2021-05-11 MED ORDER — FAMOTIDINE-CA CARB-MAG HYDROX 10-800-165 MG PO CHEW: 1.0000 | CHEWABLE_TABLET | Freq: Every day | ORAL | Status: DC | PRN

## 2021-05-11 SURGICAL SUPPLY — 59 items
ADH SKN CLS APL DERMABOND .7 (GAUZE/BANDAGES/DRESSINGS) ×1
ATTUNE MED ANAT PAT 38 KNEE (Knees) ×1 IMPLANT
ATTUNE MED ANAT PAT 38MM KNEE (Knees) ×1 IMPLANT
ATTUNE PSFEM RTSZ6 NARCEM KNEE (Femur) ×2 IMPLANT
ATTUNE PSRP INSR SZ6 10 KNEE (Insert) ×1 IMPLANT
ATTUNE PSRP INSR SZ6 10MM KNEE (Insert) ×1 IMPLANT
BAG COUNTER SPONGE SURGICOUNT (BAG) ×2 IMPLANT
BAG SPEC THK2 15X12 ZIP CLS (MISCELLANEOUS) ×1
BAG SPNG CNTER NS LX DISP (BAG) ×2
BAG SURGICOUNT SPONGE COUNTING (BAG) ×2
BAG ZIPLOCK 12X15 (MISCELLANEOUS) ×3 IMPLANT
BASE TIBIAL ROT PLAT SZ 7 KNEE (Knees) IMPLANT
BLADE SAW SGTL 11.0X1.19X90.0M (BLADE) IMPLANT
BLADE SAW SGTL 13.0X1.19X90.0M (BLADE) ×3 IMPLANT
BLADE SURG SZ10 CARB STEEL (BLADE) ×6 IMPLANT
BNDG ELASTIC 6X5.8 VLCR STR LF (GAUZE/BANDAGES/DRESSINGS) ×3 IMPLANT
BOWL SMART MIX CTS (DISPOSABLE) ×3 IMPLANT
BSPLAT TIB 7 CMNT ROT PLAT STR (Knees) ×1 IMPLANT
CEMENT HV SMART SET (Cement) ×4 IMPLANT
CUFF TOURN SGL QUICK 34 (TOURNIQUET CUFF) ×3
CUFF TRNQT CYL 34X4.125X (TOURNIQUET CUFF) ×1 IMPLANT
DECANTER SPIKE VIAL GLASS SM (MISCELLANEOUS) ×2 IMPLANT
DERMABOND ADVANCED (GAUZE/BANDAGES/DRESSINGS) ×2
DERMABOND ADVANCED .7 DNX12 (GAUZE/BANDAGES/DRESSINGS) ×1 IMPLANT
DRAPE INCISE IOBAN 66X45 STRL (DRAPES) ×3 IMPLANT
DRAPE U-SHAPE 47X51 STRL (DRAPES) ×3 IMPLANT
DRESSING AQUACEL AG SP 3.5X10 (GAUZE/BANDAGES/DRESSINGS) ×1 IMPLANT
DRSG AQUACEL AG ADV 3.5X10 (GAUZE/BANDAGES/DRESSINGS) ×3 IMPLANT
DRSG AQUACEL AG SP 3.5X10 (GAUZE/BANDAGES/DRESSINGS) ×3
DURAPREP 26ML APPLICATOR (WOUND CARE) ×6 IMPLANT
ELECT REM PT RETURN 15FT ADLT (MISCELLANEOUS) ×3 IMPLANT
GLOVE SURG ENC MOIS LTX SZ6 (GLOVE) ×3 IMPLANT
GLOVE SURG ENC MOIS LTX SZ7 (GLOVE) ×3 IMPLANT
GLOVE SURG UNDER POLY LF SZ7.5 (GLOVE) ×3 IMPLANT
GOWN STRL REUS W/TWL LRG LVL3 (GOWN DISPOSABLE) ×3 IMPLANT
HANDPIECE INTERPULSE COAX TIP (DISPOSABLE) ×3
HOLDER FOLEY CATH W/STRAP (MISCELLANEOUS) ×2 IMPLANT
KIT TURNOVER KIT A (KITS) IMPLANT
MANIFOLD NEPTUNE II (INSTRUMENTS) ×3 IMPLANT
NDL SAFETY ECLIPSE 18X1.5 (NEEDLE) ×1 IMPLANT
NEEDLE HYPO 18GX1.5 SHARP (NEEDLE) ×3
NS IRRIG 1000ML POUR BTL (IV SOLUTION) ×3 IMPLANT
PACK TOTAL KNEE CUSTOM (KITS) ×3 IMPLANT
PROTECTOR NERVE ULNAR (MISCELLANEOUS) ×3 IMPLANT
SET HNDPC FAN SPRY TIP SCT (DISPOSABLE) ×1 IMPLANT
SET PAD KNEE POSITIONER (MISCELLANEOUS) ×3 IMPLANT
SPONGE T-LAP 18X18 ~~LOC~~+RFID (SPONGE) ×6 IMPLANT
SUT MNCRL AB 4-0 PS2 18 (SUTURE) ×3 IMPLANT
SUT STRATAFIX PDS+ 0 24IN (SUTURE) ×3 IMPLANT
SUT VIC AB 1 CT1 36 (SUTURE) ×3 IMPLANT
SUT VIC AB 2-0 CT1 27 (SUTURE) ×9
SUT VIC AB 2-0 CT1 TAPERPNT 27 (SUTURE) ×3 IMPLANT
SYR 3ML LL SCALE MARK (SYRINGE) ×3 IMPLANT
TIBIAL BASE ROT PLAT SZ 7 KNEE (Knees) ×3 IMPLANT
TRAY FOLEY MTR SLVR 14FR STAT (SET/KITS/TRAYS/PACK) ×2 IMPLANT
TRAY FOLEY MTR SLVR 16FR STAT (SET/KITS/TRAYS/PACK) ×1 IMPLANT
TUBE SUCTION HIGH CAP CLEAR NV (SUCTIONS) ×5 IMPLANT
WATER STERILE IRR 1000ML POUR (IV SOLUTION) ×6 IMPLANT
WRAP KNEE MAXI GEL POST OP (GAUZE/BANDAGES/DRESSINGS) ×3 IMPLANT

## 2021-05-11 NOTE — Transfer of Care (Signed)
Immediate Anesthesia Transfer of Care Note  Patient: [name] [name]  Procedure(s) Performed: TOTAL KNEE ARTHROPLASTY (Right: Knee)  Patient Location: PACU  Anesthesia Type:Spinal  Level of Consciousness: awake  Airway & Oxygen Therapy: Patient Spontanous Breathing and Patient connected to face mask oxygen  Post-op Assessment: Report given to RN and Post -op Vital signs reviewed and stable  Post vital signs: Reviewed and stable  Last Vitals:  Vitals Value Taken Time  BP 143/78 05/11/21 1447  Temp    Pulse 49 05/11/21 1450  Resp 15 05/11/21 1450  SpO2 100 % 05/11/21 1450  Vitals shown include unvalidated device data.  Last Pain:  Vitals:   05/11/21 1046  TempSrc:   PainSc: 0-No pain      Patients Stated Pain Goal: 3 (34/74/25 9563)  Complications: No notable events documented.

## 2021-05-11 NOTE — Anesthesia Procedure Notes (Addendum)
Spinal  Patient location during procedure: OR Start time: 05/11/2021 12:55 PM End time: 05/11/2021 1:00 PM Reason for block: surgical anesthesia Staffing Performed: resident/CRNA  Resident/CRNA: Milford Cage, CRNA Preanesthetic Checklist Completed: patient identified, IV checked, site marked, risks and benefits discussed, surgical consent, monitors and equipment checked, pre-op evaluation and timeout performed Spinal Block Patient position: sitting Prep: ChloraPrep Patient monitoring: heart rate, cardiac monitor, continuous pulse ox and blood pressure Approach: midline Location: L2-3 Injection technique: single-shot Needle Needle type: Pencan  Needle gauge: 24 G Needle length: 10 cm Assessment Sensory level: T6 Events: CSF return Additional Notes Functioning IV was confirmed and monitors were applied. Sterile prep and drape, including hand hygiene and sterile gloves were used. The patient was positioned and the spine was prepped. The skin was anesthetized with lidocaine.  Free flow of clear CSF was obtained prior to injecting local anesthetic into the CSF.  The spinal needle aspirated freely following injection.  The needle was carefully withdrawn.  The patient tolerated the procedure well.

## 2021-05-11 NOTE — Anesthesia Postprocedure Evaluation (Signed)
Anesthesia Post Note  Patient: [name] [name]  Procedure(s) Performed: TOTAL KNEE ARTHROPLASTY (Right: Knee)     Patient location during evaluation: PACU Anesthesia Type: Spinal Level of consciousness: sedated and patient cooperative Pain management: pain level controlled Vital Signs Assessment: post-procedure vital signs reviewed and stable Respiratory status: spontaneous breathing Cardiovascular status: stable Anesthetic complications: no   No notable events documented.  Last Vitals:  Vitals:   05/11/21 1700 05/11/21 1719  BP: 134/76 (!) 143/83  Pulse: (!) 54 (!) 59  Resp: 14 16  Temp: 36.4 C 36.6 C  SpO2: 98% 98%    Last Pain:  Vitals:   05/11/21 1719  TempSrc: Oral  PainSc: 0-No pain                 Nolon Nations

## 2021-05-11 NOTE — Discharge Instructions (Signed)

## 2021-05-11 NOTE — Op Note (Signed)
NAME:  [name] [name]                      MEDICAL RECORD NO.:  [id number]                             FACILITY:  [facility]      PHYSICIAN:  Pietro Cassis. Alvan Dame, M.D.  DATE OF BIRTH:  January 02, 1951      DATE OF PROCEDURE:  05/11/2021                                     OPERATIVE REPORT         PREOPERATIVE DIAGNOSIS:  Right knee osteoarthritis.      POSTOPERATIVE DIAGNOSIS:  Right knee osteoarthritis.      FINDINGS:  The patient was noted to have complete loss of cartilage and   bone-on-bone arthritis with associated osteophytes in the medial and patellofemoral compartments of   the knee with a significant synovitis and associated effusion.  The patient had failed months of conservative treatment including medications, injection therapy, activity modification.     PROCEDURE:  Right total knee replacement.      COMPONENTS USED:  DePuy Attune rotating platform posterior stabilized knee   system, a size 6N femur, 7 tibia, size 10 mm PS AOX insert, and 38 anatomic patellar   button.      SURGEON:  Pietro Cassis. Alvan Dame, M.D.      ASSISTANT:  Costella Hatcher, PA-C.      ANESTHESIA:  Regional and Spinal.      SPECIMENS:  None.      COMPLICATION:  None.      DRAINS:  None.  EBL: < 100 cc      TOURNIQUET TIME:  32 min @ 225 mmHg     The patient was stable to the recovery room.      INDICATION FOR PROCEDURE:  [name] [name] is a 70 y.o. male patient of   mine.  The patient had been seen, evaluated, and treated for months conservatively in the   office with medication, activity modification, and injections.  The patient had   radiographic changes of bone-on-bone arthritis with endplate sclerosis and osteophytes noted.  Based on the radiographic changes and failed conservative measures, the patient   decided to proceed with definitive treatment, total knee replacement.  Risks of infection, DVT, component failure, need for revision surgery, neurovascular injury were reviewed in the office setting.   The postop course was reviewed stressing the efforts to maximize post-operative satisfaction and function.  Consent was obtained for benefit of pain   relief.      PROCEDURE IN DETAIL:  The patient was brought to the operative theater.   Once adequate anesthesia, preoperative antibiotics, 2 gm of Ancef,1 gm of Tranexamic Acid, and 10 mg of Decadron administered, the patient was positioned supine with a right thigh tourniquet placed.  The  right lower extremity was prepped and draped in sterile fashion.  A time-   out was performed identifying the patient, planned procedure, and the appropriate extremity.      The right lower extremity was placed in the [facility] leg holder.  The leg was   exsanguinated, tourniquet elevated to 250 mmHg.  A midline incision was   made followed by median parapatellar arthrotomy.  Following initial   exposure, attention  was first directed to the patella.  Precut   measurement was noted to be 24 mm.  I resected down to 13 mm and used a   38 anatomic patellar button to restore patellar height as well as cover the cut surface.      The lug holes were drilled and a metal shim was placed to protect the   patella from retractors and saw blade during the procedure.      At this point, attention was now directed to the femur.  The femoral   canal was opened with a drill, irrigated to try to prevent fat emboli.  An   intramedullary rod was passed at 5 degrees valgus, 9 mm of bone was   resected off the distal femur.  Following this resection, the tibia was   subluxated anteriorly.  Using the extramedullary guide, 2 mm of bone was resected off   the proximal medial tibia.  We confirmed the gap would be   stable medially and laterally with a size 6 spacer block as well as confirmed that the tibial cut was perpendicular in the coronal plane, checking with an alignment rod.      Once this was done, I sized the femur to be a size 6 in the anterior-   posterior dimension, chose  a narrow component based on medial and   lateral dimension.  The size 6 rotation block was then pinned in   position anterior referenced using the C-clamp to set rotation.  The   anterior, posterior, and  chamfer cuts were made without difficulty nor   notching making certain that I was along the anterior cortex to help   with flexion gap stability.      The final box cut was made off the lateral aspect of distal femur.      At this point, the tibia was sized to be a size 7.  The size 7 tray was   then pinned in position through the medial third of the tubercle,   drilled, and keel punched.  Trial reduction was now carried with a 6 femur,  7 tibia, a size 10 mm PS insert, and the 38 anatomic patella botton.  The knee was brought to full extension with good flexion stability with the patella   tracking through the trochlea without application of pressure.  Given   all these findings the trial components removed.  Final components were   opened and cement was mixed.  The knee was irrigated with normal saline solution and pulse lavage.  The synovial lining was   then injected with 30 cc of 0.25% Marcaine with epinephrine, 1 cc of Toradol and 30 cc of NS for a total of 61 cc.     Final implants were then cemented onto cleaned and dried cut surfaces of bone with the knee brought to extension with a size 10 mm PS trial insert.      Once the cement had fully cured, excess cement was removed   throughout the knee.  I confirmed that I was satisfied with the range of   motion and stability, and the final size 10 mm PS AOX insert was chosen.  It was   placed into the knee.      The tourniquet had been let down at 32 minutes.  No significant   hemostasis was required.  The extensor mechanism was then reapproximated using #1 Vicryl and #1 Stratafix sutures with the knee   in flexion.  The  remaining wound was closed with 2-0 Vicryl and running 4-0 Monocryl.   The knee was cleaned, dried, dressed  sterilely using Dermabond and   Aquacel dressing.  The patient was then   brought to recovery room in stable condition, tolerating the procedure   well.   Please note that Physician Assistant, Costella Hatcher, PA-C was present for the entirety of the case, and was utilized for pre-operative positioning, peri-operative retractor management, general facilitation of the procedure and for primary wound closure at the end of the case.              Pietro Cassis Alvan Dame, M.D.    05/11/2021 12:47 PM

## 2021-05-11 NOTE — Plan of Care (Signed)
Discussed with patient and family about plan of care for post-op day 0.   Will continue to monitor patient.    [place], Therapist, sports

## 2021-05-11 NOTE — Care Plan (Signed)
Ortho Bundle Case Management Note  Patient Details  Name: [name] [name] MRN: [id number] Date of Birth: August 12, 1950  R TKA on 05-11-21 DCP:  Home with sister.   1 story home with 1 ste. DME:  RW ordered through [facility].  Has a 3-in-1. PT:  [facility] on 05-14-20.                   DME Arranged:  Gilford Rile rolling DME Agency:  [employer]  HH Arranged:  NA [place] Agency:  NA  Additional Comments: Please contact me with any questions of if this plan should need to change.  Marianne Sofia, RN,CCM [facility]  [phone] 05/11/2021, 3:01 PM

## 2021-05-11 NOTE — Anesthesia Procedure Notes (Signed)
Anesthesia Regional Block: Adductor canal block   Pre-Anesthetic Checklist: , timeout performed,  Correct Patient, Correct Site, Correct Laterality,  Correct Procedure, Correct Position, site marked,  Risks and benefits discussed,  Surgical consent,  Pre-op evaluation,  At surgeon's request and post-op pain management  Laterality: Lower and Right  Prep: chloraprep       Needles:  Injection technique: Single-shot  Needle Type: Stimiplex     Needle Length: 9cm  Needle Gauge: 21     Additional Needles:   Procedures:,,,, ultrasound used (permanent image in chart),,    Narrative:  Start time: 05/11/2021 12:20 PM End time: 05/11/2021 12:40 PM Injection made incrementally with aspirations every 5 mL.  Performed by: Personally  Anesthesiologist: Nolon Nations, MD  Additional Notes: BP cuff, EKG monitors applied. Sedation begun. Artery and nerve location verified with ultrasound. Anesthetic injected incrementally (72ml), slowly, and after negative aspirations under direct u/s guidance. Good fascial/perineural spread. Tolerated well.

## 2021-05-11 NOTE — Interval H&P Note (Signed)
History and Physical Interval Note:  05/11/2021 11:26 AM  [name] [name]  has presented today for surgery, with the diagnosis of Right knee osteoarthritis.  The various methods of treatment have been discussed with the patient and family. After consideration of risks, benefits and other options for treatment, the patient has consented to  Procedure(s): TOTAL KNEE ARTHROPLASTY (Right) as a surgical intervention.  The patient's history has been reviewed, patient examined, no change in status, stable for surgery.  I have reviewed the patient's chart and labs.  Questions were answered to the patient's satisfaction.     Mauri Pole

## 2021-05-11 NOTE — Progress Notes (Signed)
PT Cancellation Note  Patient Details Name: GENERAL WEARING MRN: [id number] DOB: [birth year]   Cancelled Treatment:    Reason Eval/Treat Not Completed: Patient not medically ready (Pt with ongoing parasthesia and weakness secondary to spinal block. Will follow up at later date/time when pt ready to mobilize and schedule allows.)   Gwynneth Albright PT, DPT [place] Office [phone] Pager [phone]

## 2021-05-11 NOTE — Progress Notes (Signed)
Assisted Dr. Germeroth with right, ultrasound guided, adductor canal block. Side rails up, monitors on throughout procedure. See vital signs in flow sheet. Tolerated Procedure well. 

## 2021-05-12 ENCOUNTER — Encounter (HOSPITAL_COMMUNITY): Payer: Self-pay | Admitting: Orthopedic Surgery

## 2021-05-12 DIAGNOSIS — E119 Type 2 diabetes mellitus without complications: Secondary | ICD-10-CM | POA: Diagnosis not present

## 2021-05-12 DIAGNOSIS — I2510 Atherosclerotic heart disease of native coronary artery without angina pectoris: Secondary | ICD-10-CM | POA: Diagnosis not present

## 2021-05-12 DIAGNOSIS — I10 Essential (primary) hypertension: Secondary | ICD-10-CM | POA: Diagnosis not present

## 2021-05-12 DIAGNOSIS — M1711 Unilateral primary osteoarthritis, right knee: Secondary | ICD-10-CM | POA: Diagnosis not present

## 2021-05-12 DIAGNOSIS — Z79899 Other long term (current) drug therapy: Secondary | ICD-10-CM | POA: Diagnosis not present

## 2021-05-12 LAB — CBC
HCT: 39.4 % (ref 39.0–52.0)
Hemoglobin: 13.4 g/dL (ref 13.0–17.0)
MCH: 32.9 pg (ref 26.0–34.0)
MCHC: 34 g/dL (ref 30.0–36.0)
MCV: 96.8 fL (ref 80.0–100.0)
Platelets: 198 10*3/uL (ref 150–400)
RBC: 4.07 MIL/uL — ABNORMAL LOW (ref 4.22–5.81)
RDW: 12.4 % (ref 11.5–15.5)
WBC: 15.8 10*3/uL — ABNORMAL HIGH (ref 4.0–10.5)
nRBC: 0 % (ref 0.0–0.2)

## 2021-05-12 LAB — BASIC METABOLIC PANEL
Anion gap: 8 (ref 5–15)
BUN: 17 mg/dL (ref 8–23)
CO2: 23 mmol/L (ref 22–32)
Calcium: 8.8 mg/dL — ABNORMAL LOW (ref 8.9–10.3)
Chloride: 105 mmol/L (ref 98–111)
Creatinine, Ser: 1.02 mg/dL (ref 0.61–1.24)
GFR, Estimated: >60 mL/min (ref >60)
Glucose, Bld: 212 mg/dL — ABNORMAL HIGH (ref 70–99)
Potassium: 4.1 mmol/L (ref 3.5–5.1)
Sodium: 136 mmol/L (ref 135–145)

## 2021-05-12 LAB — GLUCOSE, CAPILLARY
Glucose-Capillary: 166 mg/dL — ABNORMAL HIGH (ref 70–99)
Glucose-Capillary: 249 mg/dL — ABNORMAL HIGH (ref 70–99)
Glucose-Capillary: 191 mg/dL — ABNORMAL HIGH (ref 70–99)

## 2021-05-12 MED ORDER — HYDROCODONE-ACETAMINOPHEN 7.5-325 MG PO TABS: 1.0000 | ORAL_TABLET | ORAL | 0 refills | Status: DC | PRN

## 2021-05-12 MED ORDER — POLYETHYLENE GLYCOL 3350 17 G PO PACK: 17.0000 g | PACK | Freq: Every day | ORAL | 0 refills | Status: DC | PRN

## 2021-05-12 MED ORDER — FAMOTIDINE 20 MG PO TABS
20.0000 mg | ORAL_TABLET | Freq: Every day | ORAL | Status: DC
  Administered 2021-05-12: 20 mg via ORAL
  Filled 2021-05-12 (×2): qty 1

## 2021-05-12 MED ORDER — DOCUSATE SODIUM 100 MG PO CAPS: 100.0000 mg | ORAL_CAPSULE | Freq: Two times a day (BID) | ORAL | 0 refills | Status: DC

## 2021-05-12 MED ORDER — METHOCARBAMOL 500 MG PO TABS
500.0000 mg | ORAL_TABLET | Freq: Four times a day (QID) | ORAL | 0 refills | Status: DC | PRN
Start: 2021-05-12 — End: 2021-08-27

## 2021-05-12 MED ORDER — ALUM & MAG HYDROXIDE-SIMETH 200-200-20 MG/5ML PO SUSP: 30.0000 mL | Freq: Four times a day (QID) | ORAL | Status: DC | PRN

## 2021-05-12 NOTE — Plan of Care (Signed)
  Problem: Activity: Goal: Ability to avoid complications of mobility impairment will improve Outcome: Progressing   Problem: Pain Management: Goal: Pain level will decrease with appropriate interventions Outcome: Progressing   Problem: Elimination: Goal: Will not experience complications related to urinary retention Outcome: Progressing

## 2021-05-12 NOTE — TOC Transition Note (Signed)
Transition of Care [facility]) - CM/SW Discharge Note  Patient Details  Name: [name] [name] MRN: [id number] Date of Birth: [birth year]  Transition of Care [facility]) CM/SW Contact:  Sherie Don, LCSW Phone Number: 05/12/2021, 9:50 AM  Clinical Narrative: Patient is expected to discharge home after working with PT. CSW met with patient to confirm discharge plan. Patient will discharge home with OPPT at [facility] on 05/14/21. Patient has a 3N1, but will need a rolling walker. [employer] delivered walker to patient's room. TOC signing off.  Final next level of care: OP Rehab Barriers to Discharge: No Barriers Identified  Patient Goals and CMS Choice Patient states their goals for this hospitalization and ongoing recovery are:: Discharge home with [place] CMS Medicare.gov Compare Post Acute Care list provided to:: Patient Choice offered to / list presented to : Patient  Discharge Plan and Services        DME Arranged: Walker rolling DME Agency: [employer] Representative spoke with at DME Agency: Prearranged in orthopedist's office HH Arranged: NA [place] Agency: NA  Readmission Risk Interventions No flowsheet data found.

## 2021-05-12 NOTE — Progress Notes (Signed)
Subjective: 1 Day Post-Op Procedure(s) (LRB): TOTAL KNEE ARTHROPLASTY (Right) Patient reports pain as mild.   Patient seen in rounds with Dr. Alvan Dame. Patient is well, and has had no acute complaints or problems. No acute events overnight. Foley catheter removed. Block was still in effect yesterday, so he has not gotten up with PT yet.  We will start therapy today.   Objective: Vital signs in last 24 hours: Temp:  [97.3 F (36.3 C)-98 F (36.7 C)] 97.7 F (36.5 C) (11/16 0531) Pulse Rate:  [46-65] 60 (11/16 0531) Resp:  [11-23] 16 (11/16 0531) BP: (125-189)/(69-97) 132/74 (11/16 0531) SpO2:  [94 %-100 %] 98 % (11/16 0531) Weight:  [84.1 kg] 84.1 kg (11/15 2018)  Intake/Output from previous day:  Intake/Output Summary (Last 24 hours) at 05/12/2021 0757 Last data filed at 05/12/2021 0600 Gross per 24 hour  Intake 2565.03 ml  Output 2300 ml  Net 265.03 ml     Intake/Output this shift: No intake/output data recorded.  Labs: Recent Labs    05/12/21 0330  HGB 13.4   Recent Labs    05/12/21 0330  WBC 15.8*  RBC 4.07*  HCT 39.4  PLT 198   Recent Labs    05/12/21 0330  NA 136  K 4.1  CL 105  CO2 23  BUN 17  CREATININE 1.02  GLUCOSE 212*  CALCIUM 8.8*   No results for input(s): LABPT, INR in the last 72 hours.  Exam: General - Patient is Alert and Oriented Extremity - Neurologically intact Sensation intact distally Intact pulses distally Dorsiflexion/Plantar flexion intact Dressing - dressing C/D/I Motor Function - intact, moving foot and toes well on exam.   Past Medical History:  Diagnosis Date   Adenomatous colon polyp 09/07/2004   Amblyopia    CAD (coronary artery disease)    cardiac CT with a 50% LAD lesion in 2007   Diverticulosis    DM2 (diabetes mellitus, type 2) (HCC)    Elevated LFTs    secondary to labetalol   Esophageal stricture 2003   External hemorrhoids 2012   lanced by Dr Rise Patience   Family history of anesthesia complication     "sister had PONV"   GERD (gastroesophageal reflux disease)    esophageal stricture, pmh   Hiatal hernia 01/04/2000   History of kidney stones 2022   HLD (hyperlipidemia)    HTN (hypertension)    Hypopotassemia     Assessment/Plan: 1 Day Post-Op Procedure(s) (LRB): TOTAL KNEE ARTHROPLASTY (Right) Active Problems:   S/P total knee arthroplasty, right  Estimated body mass index is 27.58 kg/m as calculated from the following:   Height as of this encounter: 5' 8.75" (1.746 m).   Weight as of this encounter: 84.1 kg. Advance diet Up with therapy D/C IV fluids   Patient's anticipated LOS is less than 2 midnights, meeting these requirements: - Younger than 60 - Lives within 1 hour of care - Has a competent adult at home to recover with post-op recover - NO history of  - Chronic pain requiring opiods  - Diabetes  - Coronary Artery Disease  - Heart failure  - Heart attack  - Stroke  - DVT/VTE  - Cardiac arrhythmia  - Respiratory Failure/COPD  - Renal failure  - Anemia  - Advanced Liver disease     DVT Prophylaxis -  Plavix Weight bearing as tolerated.  Plan is to go Home after hospital stay. Plan for discharge today following 1-2 sessions of PT as long as they are  meeting their goals. Patient is scheduled for OPPT. Follow up in the office in 2 weeks.   Griffith Citron, PA-C Orthopedic Surgery [phone] 05/12/2021, 7:57 AM

## 2021-05-12 NOTE — Evaluation (Signed)
Physical Therapy Evaluation Patient Details Name: [name] [name] MRN: [id number] DOB: December 22, 1950 Today's Date: 05/12/2021  History of Present Illness  Pt is a 70 year old male s/p Rt TKA  Clinical Impression  Pt is s/p TKA resulting in the deficits listed below (see PT Problem List).  Pt will benefit from skilled PT to increase their independence and safety with mobility to allow discharge to the venue listed below.  Pt assisted with ambulating to/from bathroom however limited distance due to block still working.  Pt assisted with performing exercises as well.  Pt states his sister and brother will be assisting him upon d/c.        Recommendations for follow up therapy are one component of a multi-disciplinary discharge planning process, led by the attending physician.  Recommendations may be updated based on patient status, additional functional criteria and insurance authorization.  Follow Up Recommendations Follow physician's recommendations for discharge plan and follow up therapies    Assistance Recommended at Discharge Intermittent Supervision/Assistance  Functional Status Assessment Patient has had a recent decline in their functional status and demonstrates the ability to make significant improvements in function in a reasonable and predictable amount of time.  Equipment Recommendations  Rolling walker (2 wheels)    Recommendations for Other Services       Precautions / Restrictions Precautions Precautions: Fall;Knee Restrictions Weight Bearing Restrictions: No Other Position/Activity Restrictions: WBAT      Mobility  Bed Mobility               General bed mobility comments: pt in recliner on arrival    Transfers Overall transfer level: Needs assistance Equipment used: Rolling walker (2 wheels) Transfers: Sit to/from Stand Sit to Stand: Min assist           General transfer comment: assist to rise and steady, cues for hand placement     Ambulation/Gait Ambulation/Gait assistance: Min assist Gait Distance (Feet): 8 Feet (x2) Assistive device: Rolling walker (2 wheels) Gait Pattern/deviations: Step-to pattern;Antalgic       General Gait Details: pt eager to try to use bathroom, cues for sequence and use of upper body through RW to assist with Rt LE weakness/numbness, pt ambulated to/from bathroom and attempted to use urinal however unable to void  Stairs            Wheelchair Mobility    Modified Rankin (Stroke Patients Only)       Balance                                             Pertinent Vitals/Pain Pain Assessment: No/denies pain    Home Living Family/patient expects to be discharged to:: Private residence Living Arrangements: Alone Available Help at Discharge: Family (sister and brother to assist upon d/c) Type of Home: House Home Access: Stairs to enter   Technical brewer of Steps: 1   Home Layout: Able to live on main level with bedroom/bathroom Home Equipment: BSC/3in1      Prior Function Prior Level of Function : Independent/Modified Independent                     Hand Dominance        Extremity/Trunk Assessment        Lower Extremity Assessment Lower Extremity Assessment: Generalized weakness;RLE deficits/detail RLE Deficits / Details: medial leg and knee remains numb, block  still working; [employer] 3-80* right knee, pt able to perform ankle pumps, poor quad contraction       Communication   Communication: No difficulties  Cognition Arousal/Alertness: Awake/alert Behavior During Therapy: WFL for tasks assessed/performed Overall Cognitive Status: Within Functional Limits for tasks assessed                                          General Comments      Exercises Total Joint Exercises Ankle Circles/Pumps: AROM;Both;10 reps Quad Sets: AROM;Both;10 reps Heel Slides: AAROM;Right;10 reps Hip ABduction/ADduction:  AAROM;Right;10 reps Straight Leg Raises: AAROM;Right;10 reps   Assessment/Plan    PT Assessment Patient needs continued PT services  PT Problem List Decreased strength;Decreased activity tolerance;Decreased balance;Decreased mobility;Decreased knowledge of use of DME;Pain;Decreased knowledge of precautions       PT Treatment Interventions Stair training;Gait training;Therapeutic exercise;DME instruction;Therapeutic activities;Functional mobility training;Patient/family education    PT Goals (Current goals can be found in the Care Plan section)  Acute Rehab PT Goals PT Goal Formulation: With patient Time For Goal Achievement: 05/19/21 Potential to Achieve Goals: Good    Frequency 7X/week   Barriers to discharge        Co-evaluation               AM-PAC PT "6 Clicks" Mobility  Outcome Measure Help needed turning from your back to your side while in a flat bed without using bedrails?: A Little Help needed moving from lying on your back to sitting on the side of a flat bed without using bedrails?: A Little Help needed moving to and from a bed to a chair (including a wheelchair)?: A Little Help needed standing up from a chair using your arms (e.g., wheelchair or bedside chair)?: A Lot Help needed to walk in hospital room?: A Lot Help needed climbing 3-5 steps with a railing? : A Lot 6 Click Score: 15    End of Session Equipment Utilized During Treatment: Gait belt Activity Tolerance: Patient tolerated treatment well Patient left: in chair;with call bell/phone within reach Nurse Communication: Mobility status PT Visit Diagnosis: Difficulty in walking, not elsewhere classified (R26.2)    Time: 7494-4967 PT Time Calculation (min) (ACUTE ONLY): 24 min   Charges:   PT Evaluation $PT Eval Low Complexity: 1 Low PT Treatments $Therapeutic Exercise: 8-22 mins      Jannette Spanner PT, DPT Acute Rehabilitation Services Pager: [phone] Office: [place] 05/12/2021, 11:42 AM

## 2021-05-12 NOTE — Plan of Care (Signed)
  Problem: Education: Goal: Knowledge of the prescribed therapeutic regimen will improve Outcome: Progressing   Problem: Activity: Goal: Ability to avoid complications of mobility impairment will improve Outcome: Progressing   Problem: Pain Management: Goal: Pain level will decrease with appropriate interventions Outcome: Progressing   

## 2021-05-12 NOTE — Progress Notes (Signed)
Physical Therapy Treatment Patient Details Name: [name] [name] MRN: [id number] DOB: December 08, 1950 Today's Date: 05/12/2021   History of Present Illness Pt is a 70 year old male s/p Rt TKA    PT Comments    Pt reports Rt LE remains numb.  Pt was able to ambulate in hallway however did require occasional assist for stability.  Pt not yet ready for d/c home today.    Recommendations for follow up therapy are one component of a multi-disciplinary discharge planning process, led by the attending physician.  Recommendations may be updated based on patient status, additional functional criteria and insurance authorization.  Follow Up Recommendations  Follow physician's recommendations for discharge plan and follow up therapies     Assistance Recommended at Discharge Intermittent Supervision/Assistance  Equipment Recommendations  Rolling walker (2 wheels)    Recommendations for Other Services       Precautions / Restrictions Precautions Precautions: Fall;Knee Restrictions Other Position/Activity Restrictions: WBAT     Mobility  Bed Mobility               General bed mobility comments: pt in recliner on arrival    Transfers Overall transfer level: Needs assistance Equipment used: Rolling walker (2 wheels) Transfers: Sit to/from Stand Sit to Stand: Min guard           General transfer comment: verbal cues for UE and LE positioning    Ambulation/Gait Ambulation/Gait assistance: Min guard;Min assist Gait Distance (Feet): 120 Feet Assistive device: Rolling walker (2 wheels) Gait Pattern/deviations: Step-to pattern;Antalgic       General Gait Details: verbal cues for sequence, use of RW, step length; pt reports continued numbness in Rt LE this afternoon with occasional leg instability requiring assist   Stairs             Wheelchair Mobility    Modified Rankin (Stroke Patients Only)       Balance                                             Cognition Arousal/Alertness: Awake/alert Behavior During Therapy: WFL for tasks assessed/performed Overall Cognitive Status: Within Functional Limits for tasks assessed                                          Exercises      General Comments        Pertinent Vitals/Pain Pain Assessment: 0-10 Pain Score: 1  Pain Location: posterior knee Pain Descriptors / Indicators: Aching Pain Intervention(s): Monitored during session;Repositioned    Home Living                          Prior Function            PT Goals (current goals can now be found in the care plan section) Progress towards PT goals: Progressing toward goals    Frequency    7X/week      PT Plan Current plan remains appropriate    Co-evaluation              AM-PAC PT "6 Clicks" Mobility   Outcome Measure  Help needed turning from your back to your side while in a flat bed without using bedrails?: A Little Help needed  moving from lying on your back to sitting on the side of a flat bed without using bedrails?: A Little Help needed moving to and from a bed to a chair (including a wheelchair)?: A Little Help needed standing up from a chair using your arms (e.g., wheelchair or bedside chair)?: A Little Help needed to walk in hospital room?: A Lot Help needed climbing 3-5 steps with a railing? : A Lot 6 Click Score: 16    End of Session Equipment Utilized During Treatment: Gait belt Activity Tolerance: Patient tolerated treatment well Patient left: Other (comment) (in bathroom on BSC (has not yet voided today), aware to use call bell and not get up alone) Nurse Communication: Mobility status (NT aware pt in bathroom, to use pull cord) PT Visit Diagnosis: Difficulty in walking, not elsewhere classified (R26.2)     Time: 8088-1103 PT Time Calculation (min) (ACUTE ONLY): 21 min  Charges:  $Gait Training: 8-22 mins           Arlyce Dice, DPT Acute Rehabilitation  Services Pager: [phone] Office: [place] 05/12/2021, 4:37 PM

## 2021-05-13 DIAGNOSIS — M1711 Unilateral primary osteoarthritis, right knee: Secondary | ICD-10-CM | POA: Diagnosis not present

## 2021-05-13 DIAGNOSIS — I10 Essential (primary) hypertension: Secondary | ICD-10-CM | POA: Diagnosis not present

## 2021-05-13 DIAGNOSIS — I2510 Atherosclerotic heart disease of native coronary artery without angina pectoris: Secondary | ICD-10-CM | POA: Diagnosis not present

## 2021-05-13 DIAGNOSIS — E119 Type 2 diabetes mellitus without complications: Secondary | ICD-10-CM | POA: Diagnosis not present

## 2021-05-13 DIAGNOSIS — Z79899 Other long term (current) drug therapy: Secondary | ICD-10-CM | POA: Diagnosis not present

## 2021-05-13 LAB — CBC
HCT: 35.9 % — ABNORMAL LOW (ref 39.0–52.0)
Hemoglobin: 12.3 g/dL — ABNORMAL LOW (ref 13.0–17.0)
MCH: 33.2 pg (ref 26.0–34.0)
MCHC: 34.3 g/dL (ref 30.0–36.0)
MCV: 96.8 fL (ref 80.0–100.0)
Platelets: 187 10*3/uL (ref 150–400)
RBC: 3.71 MIL/uL — ABNORMAL LOW (ref 4.22–5.81)
RDW: 12.7 % (ref 11.5–15.5)
WBC: 14.9 10*3/uL — ABNORMAL HIGH (ref 4.0–10.5)
nRBC: 0 % (ref 0.0–0.2)

## 2021-05-13 LAB — GLUCOSE, CAPILLARY
Glucose-Capillary: 116 mg/dL — ABNORMAL HIGH (ref 70–99)
Glucose-Capillary: 141 mg/dL — ABNORMAL HIGH (ref 70–99)

## 2021-05-13 NOTE — Progress Notes (Signed)
Physical Therapy Treatment Patient Details Name: [name] [name] MRN: [id number] DOB: Nov 04, 1950 Today's Date: 05/13/2021   History of Present Illness Pt is a 70 year old male s/p Rt TKA    PT Comments    Pt progressing well with mobility and eager for dc home.  Pt performed HEP with written instruction provided and reviewed.  Pt up to ambulate in hall and negotiated stair with written instruction provided.   Recommendations for follow up therapy are one component of a multi-disciplinary discharge planning process, led by the attending physician.  Recommendations may be updated based on patient status, additional functional criteria and insurance authorization.  Follow Up Recommendations  Follow physician's recommendations for discharge plan and follow up therapies     Assistance Recommended at Discharge Intermittent Supervision/Assistance  Equipment Recommendations  Rolling walker (2 wheels)    Recommendations for Other Services       Precautions / Restrictions Precautions Precautions: Fall;Knee Restrictions Weight Bearing Restrictions: No (Simultaneous filing. User may not have seen previous data.) RLE Weight Bearing: Weight bearing as tolerated Other Position/Activity Restrictions: WBAT     Mobility  Bed Mobility               General bed mobility comments: Pt up in chair and requests back to same    Transfers Overall transfer level: Needs assistance Equipment used: Rolling walker (2 wheels) Transfers: Sit to/from Stand Sit to Stand: Min guard;Supervision           General transfer comment: pt self-cues for UE and LE positioning    Ambulation/Gait Ambulation/Gait assistance: Min guard;Supervision Gait Distance (Feet): 111 Feet Assistive device: Rolling walker (2 wheels) Gait Pattern/deviations: Step-to pattern;Decreased step length - right;Decreased step length - left;Shuffle;Trunk flexed Gait velocity: decr     General Gait Details: cues for  posture, position from RW and sequence   Stairs Stairs: Yes   Stair Management: No rails;Backwards;Step to pattern;With walker Number of Stairs: 2 General stair comments: single step twice bkwd with RW and cues for sequence and foot/RW placement   Wheelchair Mobility    Modified Rankin (Stroke Patients Only)       Balance Overall balance assessment: Needs assistance Sitting-balance support: No upper extremity supported;Feet supported Sitting balance-Leahy Scale: Good     Standing balance support: No upper extremity supported Standing balance-Leahy Scale: Fair                              Cognition Arousal/Alertness: Awake/alert Behavior During Therapy: WFL for tasks assessed/performed Overall Cognitive Status: Within Functional Limits for tasks assessed                                          Exercises Total Joint Exercises Ankle Circles/Pumps: AROM;Both;20 reps;Supine Quad Sets: AROM;Both;10 reps;Supine Heel Slides: AAROM;Right;Supine Hip ABduction/ADduction: AAROM;Right;10 reps Straight Leg Raises: AAROM;Right;Supine;10 reps    General Comments        Pertinent Vitals/Pain Pain Assessment: 0-10 Pain Score: 5  Pain Location: R knee Pain Descriptors / Indicators: Aching;Grimacing;Sore Pain Intervention(s): Limited activity within patient's tolerance;Monitored during session;Premedicated before session;Ice applied    Home Living                          Prior Function            PT  Goals (current goals can now be found in the care plan section) Acute Rehab PT Goals PT Goal Formulation: With patient Time For Goal Achievement: 05/19/21 Potential to Achieve Goals: Good Progress towards PT goals: Progressing toward goals    Frequency    7X/week      PT Plan Current plan remains appropriate    Co-evaluation              AM-PAC PT "6 Clicks" Mobility   Outcome Measure  Help needed turning from  your back to your side while in a flat bed without using bedrails?: A Little Help needed moving from lying on your back to sitting on the side of a flat bed without using bedrails?: A Little Help needed moving to and from a bed to a chair (including a wheelchair)?: A Little Help needed standing up from a chair using your arms (e.g., wheelchair or bedside chair)?: A Little Help needed to walk in hospital room?: A Little Help needed climbing 3-5 steps with a railing? : A Little 6 Click Score: 18    End of Session Equipment Utilized During Treatment: Gait belt Activity Tolerance: Patient tolerated treatment well Patient left: in chair;with call bell/phone within reach Nurse Communication: Mobility status PT Visit Diagnosis: Difficulty in walking, not elsewhere classified (R26.2)     Time: 7209-1980 PT Time Calculation (min) (ACUTE ONLY): 30 min  Charges:  $Gait Training: 8-22 mins $Therapeutic Exercise: 8-22 mins                     Debe Coder PT Acute Rehabilitation Services Pager [phone] Office [phone]    [name] [name] 05/13/2021, 12:48 PM

## 2021-05-13 NOTE — Progress Notes (Signed)
Physical Therapy Treatment Patient Details Name: [name] [name] MRN: [id number] DOB: 04/20/1951 Today's Date: 05/13/2021   History of Present Illness Pt is a 70 year old male s/p Rt TKA    PT Comments    Pt performed therex program with assist and with noted increased pain this am.  Mobility deferred at pt request on arrival of bfast.  Pt requesting next pain meds prior to completing PT.   Recommendations for follow up therapy are one component of a multi-disciplinary discharge planning process, led by the attending physician.  Recommendations may be updated based on patient status, additional functional criteria and insurance authorization.  Follow Up Recommendations  Follow physician's recommendations for discharge plan and follow up therapies     Assistance Recommended at Discharge Intermittent Supervision/Assistance  Equipment Recommendations  Rolling walker (2 wheels)    Recommendations for Other Services       Precautions / Restrictions Precautions Precautions: Fall;Knee Restrictions Weight Bearing Restrictions: No Other Position/Activity Restrictions: WBAT     Mobility  Bed Mobility               General bed mobility comments: pt in recliner on arrival    Transfers                        Ambulation/Gait                   Stairs             Wheelchair Mobility    Modified Rankin (Stroke Patients Only)       Balance                                            Cognition Arousal/Alertness: Awake/alert Behavior During Therapy: WFL for tasks assessed/performed Overall Cognitive Status: Within Functional Limits for tasks assessed                                          Exercises Total Joint Exercises Ankle Circles/Pumps: AROM;Both;20 reps;Supine Quad Sets: AROM;Both;10 reps;Supine Heel Slides: AAROM;Right;15 reps;Supine Straight Leg Raises: AAROM;Right;15 reps;Supine    General  Comments        Pertinent Vitals/Pain Pain Assessment: 0-10 Pain Score: 6  Pain Location: R knee Pain Descriptors / Indicators: Aching;Grimacing;Sore Pain Intervention(s): Limited activity within patient's tolerance;Monitored during session;Premedicated before session;Ice applied    Home Living                          Prior Function            PT Goals (current goals can now be found in the care plan section) Acute Rehab PT Goals PT Goal Formulation: With patient Time For Goal Achievement: 05/19/21 Potential to Achieve Goals: Good Progress towards PT goals: Progressing toward goals    Frequency    7X/week      PT Plan Current plan remains appropriate    Co-evaluation              AM-PAC PT "6 Clicks" Mobility   Outcome Measure  Help needed turning from your back to your side while in a flat bed without using bedrails?: A Little Help needed moving from lying on your back to  sitting on the side of a flat bed without using bedrails?: A Little Help needed moving to and from a bed to a chair (including a wheelchair)?: A Little Help needed standing up from a chair using your arms (e.g., wheelchair or bedside chair)?: A Little Help needed to walk in hospital room?: A Lot Help needed climbing 3-5 steps with a railing? : A Lot 6 Click Score: 16    End of Session Equipment Utilized During Treatment: Gait belt Activity Tolerance: Patient tolerated treatment well Patient left: in chair;with call bell/phone within reach Nurse Communication: Mobility status PT Visit Diagnosis: Difficulty in walking, not elsewhere classified (R26.2)     Time: 2025-4270 PT Time Calculation (min) (ACUTE ONLY): 19 min  Charges:  $Therapeutic Exercise: 8-22 mins                     [place] Pager [phone] Office [phone]    [name] [name] 05/13/2021, 8:31 AM

## 2021-05-13 NOTE — Progress Notes (Signed)
Provided discharge education/instructions, all questions and concerns addressed, Pt not in acute distress, RW delivered to room. Pt to discharge home with belongings accompanied by sister.

## 2021-05-13 NOTE — Progress Notes (Signed)
Subjective: 2 Days Post-Op Procedure(s) (LRB): TOTAL KNEE ARTHROPLASTY (Right) Patient reports pain as mild.   Patient seen in rounds for Dr. Alvan Dame. Patient is well, and has had no acute complaints or problems. No acute events overnight. He did have some issues voiding yesterday, but did not need to be catheterized. He is voiding today without difficulty. He feels the numbness in his leg is now worn off, and his pain has increased.  We will continue therapy today.   Objective: Vital signs in last 24 hours: Temp:  [97.7 F (36.5 C)-98.7 F (37.1 C)] 98 F (36.7 C) (11/17 0506) Pulse Rate:  [50-64] 60 (11/17 0506) Resp:  [16-18] 16 (11/17 0506) BP: (111-128)/(67-71) 121/70 (11/17 0506) SpO2:  [95 %-99 %] 95 % (11/17 0506)  Intake/Output from previous day:  Intake/Output Summary (Last 24 hours) at 05/13/2021 0742 Last data filed at 05/13/2021 0600 Gross per 24 hour  Intake 1420 ml  Output 1200 ml  Net 220 ml     Intake/Output this shift: No intake/output data recorded.  Labs: Recent Labs    05/12/21 0330 05/13/21 0322  HGB 13.4 12.3*   Recent Labs    05/12/21 0330 05/13/21 0322  WBC 15.8* 14.9*  RBC 4.07* 3.71*  HCT 39.4 35.9*  PLT 198 187   Recent Labs    05/12/21 0330  NA 136  K 4.1  CL 105  CO2 23  BUN 17  CREATININE 1.02  GLUCOSE 212*  CALCIUM 8.8*   No results for input(s): LABPT, INR in the last 72 hours.  Exam: General - Patient is Alert and Oriented Extremity - Neurologically intact Sensation intact distally Intact pulses distally Dorsiflexion/Plantar flexion intact Dressing - dressing C/D/I Motor Function - intact, moving foot and toes well on exam.   Past Medical History:  Diagnosis Date   Adenomatous colon polyp 09/07/2004   Amblyopia    CAD (coronary artery disease)    cardiac CT with a 50% LAD lesion in 2007   Diverticulosis    DM2 (diabetes mellitus, type 2) (HCC)    Elevated LFTs    secondary to labetalol   Esophageal  stricture 2003   External hemorrhoids 2012   lanced by Dr Rise Patience   Family history of anesthesia complication    "sister had PONV"   GERD (gastroesophageal reflux disease)    esophageal stricture, pmh   Hiatal hernia 01/04/2000   History of kidney stones 2022   HLD (hyperlipidemia)    HTN (hypertension)    Hypopotassemia     Assessment/Plan: 2 Days Post-Op Procedure(s) (LRB): TOTAL KNEE ARTHROPLASTY (Right) Active Problems:   S/P total knee arthroplasty, right  Estimated body mass index is 27.58 kg/m as calculated from the following:   Height as of this encounter: 5' 8.75" (1.746 m).   Weight as of this encounter: 84.1 kg. Advance diet Up with therapy D/C IV fluids   Patient's anticipated LOS is less than 2 midnights, meeting these requirements: - Younger than 82 - Lives within 1 hour of care - Has a competent adult at home to recover with post-op recover - NO history of  - Chronic pain requiring opiods  - Diabetes  - Coronary Artery Disease  - Heart failure  - Heart attack  - Stroke  - DVT/VTE  - Cardiac arrhythmia  - Respiratory Failure/COPD  - Renal failure  - Anemia  - Advanced Liver disease     DVT Prophylaxis -  Plavix Weight bearing as tolerated.  Block has worn  off now, so he should have improved muscular control today.   Plan is to go Home after hospital stay. Plan for discharge today following 1-2 sessions of PT as long as they are meeting their goals. Patient is scheduled for OPPT. Follow up in the office in 2 weeks.   Griffith Citron, PA-C Orthopedic Surgery [phone] 05/13/2021, 7:42 AM

## 2021-05-13 NOTE — Plan of Care (Signed)
  Problem: Activity: Goal: Ability to avoid complications of mobility impairment will improve Outcome: Progressing   Problem: Clinical Measurements: Goal: Postoperative complications will be avoided or minimized Outcome: Progressing   Problem: Pain Management: Goal: Pain level will decrease with appropriate interventions Outcome: Progressing   Problem: Skin Integrity: Goal: Will show signs of wound healing Outcome: Progressing   

## 2021-05-14 DIAGNOSIS — M25561 Pain in right knee: Secondary | ICD-10-CM | POA: Diagnosis not present

## 2021-05-17 DIAGNOSIS — M25561 Pain in right knee: Secondary | ICD-10-CM | POA: Diagnosis not present

## 2021-05-19 DIAGNOSIS — M25561 Pain in right knee: Secondary | ICD-10-CM | POA: Diagnosis not present

## 2021-05-24 NOTE — Discharge Summary (Signed)
Physician Discharge Summary   Patient ID: [name] [name] MRN: [id number] DOB/AGE: 1951/04/30 70 y.o.  Admit date: 05/11/2021 Discharge date: 05/13/2021  Primary Diagnosis: Right knee osteoarthritis.   Admission Diagnoses:  Past Medical History:  Diagnosis Date   Adenomatous colon polyp 09/07/2004   Amblyopia    CAD (coronary artery disease)    cardiac CT with a 50% LAD lesion in 2007   Diverticulosis    DM2 (diabetes mellitus, type 2) (HCC)    Elevated LFTs    secondary to labetalol   Esophageal stricture 2003   External hemorrhoids 2012   lanced by Dr Rise Patience   Family history of anesthesia complication    "sister had PONV"   GERD (gastroesophageal reflux disease)    esophageal stricture, pmh   Hiatal hernia 01/04/2000   History of kidney stones 2022   HLD (hyperlipidemia)    HTN (hypertension)    Hypopotassemia    Discharge Diagnoses:   Active Problems:   S/P total knee arthroplasty, right  Estimated body mass index is 27.58 kg/m as calculated from the following:   Height as of this encounter: 5' 8.75" (1.746 m).   Weight as of this encounter: 84.1 kg.  Procedure:  Procedure(s) (LRB): TOTAL KNEE ARTHROPLASTY (Right)   Consults: None  HPI: [name] [name] is a 70 y.o. male patient of   mine.  The patient had been seen, evaluated, and treated for months conservatively in the   office with medication, activity modification, and injections.  The patient had   radiographic changes of bone-on-bone arthritis with endplate sclerosis and osteophytes noted.  Based on the radiographic changes and failed conservative measures, the patient   decided to proceed with definitive treatment, total knee replacement.  Risks of infection, DVT, component failure, need for revision surgery, neurovascular injury were reviewed in the office setting.  The postop course was reviewed stressing the efforts to maximize post-operative satisfaction and function.  Consent was obtained for  benefit of pain   relief.   Laboratory Data: Admission on 05/11/2021, Discharged on 05/13/2021  Component Date Value Ref Range Status   ABO/RH(D) 05/11/2021    Final                   Value:O POS Performed at [facility] [address] [place], [place] [address]    Glucose-Capillary 05/11/2021 135 (H)  70 - 99 mg/dL Final   Glucose reference range applies only to samples taken after fasting for at least 8 hours.   Glucose-Capillary 05/11/2021 138 (H)  70 - 99 mg/dL Final   Glucose reference range applies only to samples taken after fasting for at least 8 hours.   WBC 05/12/2021 15.8 (H)  4.0 - 10.5 K/uL Final   RBC 05/12/2021 4.07 (L)  4.22 - 5.81 MIL/uL Final   Hemoglobin 05/12/2021 13.4  13.0 - 17.0 g/dL Final   HCT 05/12/2021 39.4  39.0 - 52.0 % Final   MCV 05/12/2021 96.8  80.0 - 100.0 fL Final   MCH 05/12/2021 32.9  26.0 - 34.0 pg Final   MCHC 05/12/2021 34.0  30.0 - 36.0 g/dL Final   RDW 05/12/2021 12.4  11.5 - 15.5 % Final   Platelets 05/12/2021 198  150 - 400 K/uL Final   nRBC 05/12/2021 0.0  0.0 - 0.2 % Final   Performed at [facility], [place] [address], [place], [place] [address]   Sodium 05/12/2021 136  135 - 145 mmol/L Final  Potassium 05/12/2021 4.1  3.5 - 5.1 mmol/L Final   Chloride 05/12/2021 105  98 - 111 mmol/L Final   CO2 05/12/2021 23  22 - 32 mmol/L Final   Glucose, Bld 05/12/2021 212 (H)  70 - 99 mg/dL Final   Glucose reference range applies only to samples taken after fasting for at least 8 hours.   BUN 05/12/2021 17  8 - 23 mg/dL Final   Creatinine, Ser 05/12/2021 1.02  0.61 - 1.24 mg/dL Final   Calcium 05/12/2021 8.8 (L)  8.9 - 10.3 mg/dL Final   GFR, Estimated 05/12/2021 >60  >60 mL/min Final   Comment: (NOTE) Calculated using the CKD-EPI Creatinine Equation (2021)    Anion gap 05/12/2021 8  5 - 15 Final   Performed at [facility], [place] [address] [place], [place] [address]    Glucose-Capillary 05/11/2021 316 (H)  70 - 99 mg/dL Final   Glucose reference range applies only to samples taken after fasting for at least 8 hours.   Glucose-Capillary 05/12/2021 166 (H)  70 - 99 mg/dL Final   Glucose reference range applies only to samples taken after fasting for at least 8 hours.   Glucose-Capillary 05/12/2021 249 (H)  70 - 99 mg/dL Final   Glucose reference range applies only to samples taken after fasting for at least 8 hours.   WBC 05/13/2021 14.9 (H)  4.0 - 10.5 K/uL Final   RBC 05/13/2021 3.71 (L)  4.22 - 5.81 MIL/uL Final   Hemoglobin 05/13/2021 12.3 (L)  13.0 - 17.0 g/dL Final   HCT 05/13/2021 35.9 (L)  39.0 - 52.0 % Final   MCV 05/13/2021 96.8  80.0 - 100.0 fL Final   MCH 05/13/2021 33.2  26.0 - 34.0 pg Final   MCHC 05/13/2021 34.3  30.0 - 36.0 g/dL Final   RDW 05/13/2021 12.7  11.5 - 15.5 % Final   Platelets 05/13/2021 187  150 - 400 K/uL Final   nRBC 05/13/2021 0.0  0.0 - 0.2 % Final   Performed at [facility], [place] [address] [place], [place] [address]   Glucose-Capillary 05/12/2021 191 (H)  70 - 99 mg/dL Final   Glucose reference range applies only to samples taken after fasting for at least 8 hours.   Glucose-Capillary 05/13/2021 116 (H)  70 - 99 mg/dL Final   Glucose reference range applies only to samples taken after fasting for at least 8 hours.   Glucose-Capillary 05/13/2021 141 (H)  70 - 99 mg/dL Final   Glucose reference range applies only to samples taken after fasting for at least 8 hours.  Orders Only on 05/07/2021  Component Date Value Ref Range Status   SARS Coronavirus 2 05/07/2021 RESULT: NEGATIVE   Final   Comment: RESULT: NEGATIVESARS-CoV-2 INTERPRETATION:A NEGATIVE  test result means that SARS-CoV-2 RNA was not present in the specimen above the limit of detection of this test. This does not preclude a possible SARS-CoV-2 infection and should not be used as the  sole basis for patient management decisions. Negative  results must be combined with clinical observations, patient history, and epidemiological information. Optimum specimen types and timing for peak viral levels during infections caused by SARS-CoV-2  have not been determined. Collection of multiple specimens or types of specimens may be necessary to detect virus. Improper specimen collection and handling, sequence variability under primers/probes, or organism present below the limit of detection may  lead to false negative results. Positive and negative predictive values of testing are  highly dependent on prevalence. False negative test results are more likely when prevalence of disease is high.The expected result is NEGATIVE.Fact S                          heet for  Healthcare Providers: LocalChronicle.no Sheet for Patients: SalonLookup.es Reference Range - Negative   Hospital Outpatient Visit on 04/30/2021  Component Date Value Ref Range Status   MRSA, PCR 04/30/2021 NEGATIVE  NEGATIVE Final   Staphylococcus aureus 04/30/2021 NEGATIVE  NEGATIVE Final   Comment: (NOTE) The Xpert SA Assay (FDA approved for NASAL specimens in patients 49 years of age and older), is one component of a comprehensive surveillance program. It is not intended to diagnose infection nor to guide or monitor treatment. Performed at [facility], [place] [address] [place], [place] [address]    WBC 04/30/2021 7.7  4.0 - 10.5 K/uL Final   RBC 04/30/2021 4.57  4.22 - 5.81 MIL/uL Final   Hemoglobin 04/30/2021 15.0  13.0 - 17.0 g/dL Final   HCT 04/30/2021 44.4  39.0 - 52.0 % Final   MCV 04/30/2021 97.2  80.0 - 100.0 fL Final   MCH 04/30/2021 32.8  26.0 - 34.0 pg Final   MCHC 04/30/2021 33.8  30.0 - 36.0 g/dL Final   RDW 04/30/2021 12.7  11.5 - 15.5 % Final   Platelets 04/30/2021 230  150 - 400 K/uL Final   nRBC 04/30/2021 0.0  0.0 - 0.2 % Final   Performed at [facility], [place] [address], [place], [place] [address]   Sodium 04/30/2021 138  135 - 145 mmol/L Final   Potassium 04/30/2021 3.9  3.5 - 5.1 mmol/L Final   Chloride 04/30/2021 105  98 - 111 mmol/L Final   CO2 04/30/2021 25  22 - 32 mmol/L Final   Glucose, Bld 04/30/2021 208 (H)  70 - 99 mg/dL Final   Glucose reference range applies only to samples taken after fasting for at least 8 hours.   BUN 04/30/2021 15  8 - 23 mg/dL Final   Creatinine, Ser 04/30/2021 0.85  0.61 - 1.24 mg/dL Final   Calcium 04/30/2021 9.2  8.9 - 10.3 mg/dL Final   Total Protein 04/30/2021 6.8  6.5 - 8.1 g/dL Final   Albumin 04/30/2021 4.1  3.5 - 5.0 g/dL Final   AST 04/30/2021 17  15 - 41 U/L Final   ALT 04/30/2021 23  0 - 44 U/L Final   Alkaline Phosphatase 04/30/2021 86  38 - 126 U/L Final   Total Bilirubin 04/30/2021 0.9  0.3 - 1.2 mg/dL Final   GFR, Estimated 04/30/2021 >60  >60 mL/min Final   Comment: (NOTE) Calculated using the CKD-EPI Creatinine Equation (2021)    Anion gap 04/30/2021 8  5 - 15 Final   Performed at [facility], [place] [address], [place], [place] [address]   ABO/RH(D) 04/30/2021 O POS   Final   Antibody Screen 04/30/2021 NEG   Final   Sample Expiration 04/30/2021 05/14/2021,2359   Final   Extend sample reason 04/30/2021    Final                   Value:NO TRANSFUSIONS OR PREGNANCY IN THE PAST 3 MONTHS Performed at [facility] [address], [place], [place] [address]    Hgb A1c MFr Bld 04/30/2021 7.3 (H)  4.8 - 5.6 % Final   Comment: (NOTE) Pre diabetes:  5.7%-6.4%  Diabetes:              >6.4%  Glycemic control for   <7.0% adults with diabetes    Mean Plasma Glucose 04/30/2021 162.81  mg/dL Final   Performed at [facility], [name] [address], [place], [place] [address]   Glucose-Capillary 04/30/2021 195 (H)  70 - 99 mg/dL Final   Glucose reference range applies only to samples taken after fasting for at least 8 hours.  Abstract on 04/30/2021   Component Date Value Ref Range Status   HM Diabetic Eye Exam 04/28/2021 No Retinopathy  No Retinopathy Final     X-Rays:No results found.  EKG: Orders placed or performed in visit on 09/18/20   EKG 12-Lead     Hospital Course: [name] [name] is a 70 y.o. who was admitted to [facility]. They were brought to the operating room on 05/11/2021 and underwent Procedure(s): TOTAL KNEE ARTHROPLASTY.  Patient tolerated the procedure well and was later transferred to the recovery room and then to the orthopaedic floor for postoperative care. They were given PO and IV analgesics for pain control following their surgery. They were given 24 hours of postoperative antibiotics of  Anti-infectives (From admission, onward)    Start     Dose/Rate Route Frequency Ordered Stop   05/11/21 1900  ceFAZolin (ANCEF) IVPB 2g/100 mL premix        2 g 200 mL/hr over 30 Minutes Intravenous Every 6 hours 05/11/21 1718 05/12/21 0042   05/11/21 1030  ceFAZolin (ANCEF) IVPB 2g/100 mL premix        2 g 200 mL/hr over 30 Minutes Intravenous On call to O.R. 05/11/21 1015 05/11/21 1301      and started on DVT prophylaxis in the form of  Plavix .   PT and OT were ordered for total joint protocol. Discharge planning consulted to help with postop disposition and equipment needs. Patient had a good night on the evening of surgery. They started to get up OOB with therapy on POD #1 but was limited by his block still being in effect. Continued to work with therapy into POD #2. Pt was seen during rounds on day two and was ready to go home pending progress with therapy. Pt worked with therapy for two additional sessions and was meeting their goals. He was discharged to home later that day in stable condition.  Diet: Regular diet Activity: WBAT Follow-up: in 3 weeks Disposition: Home Discharged Condition: good   Discharge Instructions     Call MD / Call 911   Complete by: As directed    If you experience chest pain  or shortness of breath, CALL 911 and be transported to the hospital emergency room.  If you develope a fever above 101 F, pus (white drainage) or increased drainage or redness at the wound, or calf pain, call your surgeon's office.   Change dressing   Complete by: As directed    Maintain surgical dressing until follow up in the clinic. If the edges start to pull up, may reinforce with tape. If the dressing is no longer working, may remove and cover with gauze and tape, but must keep the area dry and clean.  Call with any questions or concerns.   Constipation Prevention   Complete by: As directed    Drink plenty of fluids.  Prune juice may be helpful.  You may use a stool softener, such as Colace (over the counter) 100 mg twice a  day.  Use MiraLax (over the counter) for constipation as needed.   Diet - low sodium heart healthy   Complete by: As directed    Increase activity slowly as tolerated   Complete by: As directed    Weight bearing as tolerated with assist device (walker, cane, etc) as directed, use it as long as suggested by your surgeon or therapist, typically at least 4-6 weeks.   Post-operative opioid taper instructions:   Complete by: As directed    POST-OPERATIVE OPIOID TAPER INSTRUCTIONS: It is important to wean off of your opioid medication as soon as possible. If you do not need pain medication after your surgery it is ok to stop day one. Opioids include: Codeine, Hydrocodone(Norco, Vicodin), Oxycodone(Percocet, oxycontin) and hydromorphone amongst others.  Long term and even short term use of opiods can cause: Increased pain response Dependence Constipation Depression Respiratory depression And more.  Withdrawal symptoms can include Flu like symptoms Nausea, vomiting And more Techniques to manage these symptoms Hydrate well Eat regular healthy meals Stay active Use relaxation techniques(deep breathing, meditating, yoga) Do Not substitute Alcohol to help with  tapering If you have been on opioids for less than two weeks and do not have pain than it is ok to stop all together.  Plan to wean off of opioids This plan should start within one week post op of your joint replacement. Maintain the same interval or time between taking each dose and first decrease the dose.  Cut the total daily intake of opioids by one tablet each day Next start to increase the time between doses. The last dose that should be eliminated is the evening dose.      TED hose   Complete by: As directed    Use stockings (TED hose) for 2 weeks on both leg(s).  You may remove them at night for sleeping.      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   clopidogrel 75 MG tablet Commonly known as: PLAVIX TAKE 1 TABLET DAILY   Coenzyme Q10 200 MG capsule Take 200 mg by mouth daily. Notes to patient: Resume home regimen   docusate sodium 100 MG capsule Commonly known as: COLACE Take 1 capsule (100 mg total) by mouth 2 (two) times daily.   famotidine-calcium carbonate-magnesium hydroxide 10-800-165 MG chewable tablet Commonly known as: PEPCID COMPLETE Chew 1 tablet by mouth daily as needed (heartburn). Notes to patient: Resume home regimen   FUNGI-NAIL TOE & FOOT EX Apply 1 application topically every other day. Notes to patient: Resume home regimen   Glucosamine Chond  Complex/MSM Tabs Take 1 tablet by mouth daily. Notes to patient: Resume home regimen   glucose blood test strip Commonly known as: ONE TOUCH ULTRA TEST Use one strip per test.  Test blood sugars 1-4 times daily as instructed.   HYDROcodone-acetaminophen 7.5-325 MG tablet Commonly known as: NORCO Take 1-2 tablets by mouth every 4 (four) hours as needed for severe pain (pain score 7-10). Notes to patient: Last dose given 11/17 09:59am   irbesartan 300 MG tablet Commonly known as: AVAPRO TAKE 1 TABLET DAILY.   metFORMIN 500 MG 24 hr tablet Commonly known as: GLUCOPHAGE-XR Take 2 tablet (1000Mg  total) by mouth daily after supper   methocarbamol 500 MG tablet Commonly known as: ROBAXIN Take 1 tablet (500 mg total) by mouth every 6 (six) hours as needed for muscle spasms. Notes to patient: Last dose given 11/17 08:59am   multivitamin with minerals tablet Take 1 tablet by mouth daily. Notes to patient: Resume home regimen   nitroGLYCERIN 0.4 MG SL tablet Commonly known as: NITROSTAT Place 1 tablet (0.4 mg total) under the tongue every 5 (five) minutes as needed for chest pain. Notes to patient: Resume home regimen   polyethylene glycol 17 g packet Commonly known as: MIRALAX / GLYCOLAX Take 17 g by mouth daily as needed for mild constipation.   PROTEIN PO Take 1 Container by mouth 2 (two) times a week. Notes to patient: Resume home regimen   sildenafil 20 MG tablet Commonly known as: REVATIO Take 1-5 tablets by mouth daily as needed. Notes to patient: Resume home regimen   spironolactone 25 MG tablet Commonly known as: ALDACTONE TAKE 1 TABLET DAILY   vitamin B-12 1000 MCG tablet Commonly known as: CYANOCOBALAMIN Take 1,000 mcg by mouth daily. Notes to patient: Resume home regimen   ZICAM ALLERGY RELIEF NA Place 1 spray into the nose daily as needed (allergies). Notes to patient: Resume home regimen               Discharge Care Instructions  (From  admission, onward)           Start     Ordered   05/12/21 0000  Change dressing       Comments: Maintain surgical dressing until follow up in the clinic. If the edges start to pull up, may reinforce with tape. If the dressing is no longer working, may remove and cover with gauze and tape, but must keep the area dry and clean.  Call with any questions or concerns.   05/12/21 0805            Follow-up Information     [name], PA-C. Go on 05/26/2021.   Specialty: Orthopedic Surgery Why: You are scheduled for a follow up appointment on 05-26-21 at 9:45 am. Contact information: [address] [place] [address] [phone]                 Signed: Griffith Citron, PA-C Orthopedic Surgery 05/24/2021, 2:33 PM

## 2021-05-25 ENCOUNTER — Encounter (HOSPITAL_COMMUNITY): Payer: Self-pay

## 2021-05-25 ENCOUNTER — Telehealth: Payer: Self-pay

## 2021-05-25 ENCOUNTER — Other Ambulatory Visit: Payer: Self-pay

## 2021-05-25 ENCOUNTER — Emergency Department (HOSPITAL_COMMUNITY)
Admission: EM | Admit: 2021-05-25 | Discharge: 2021-05-25 | Disposition: A | Discharge status: Home / Self Care | Payer: Medicare Other | Attending: Emergency Medicine | Admitting: Emergency Medicine

## 2021-05-25 DIAGNOSIS — R61 Generalized hyperhidrosis: Secondary | ICD-10-CM | POA: Diagnosis not present

## 2021-05-25 DIAGNOSIS — I10 Essential (primary) hypertension: Secondary | ICD-10-CM | POA: Insufficient documentation

## 2021-05-25 DIAGNOSIS — M25461 Effusion, right knee: Secondary | ICD-10-CM | POA: Diagnosis not present

## 2021-05-25 DIAGNOSIS — R739 Hyperglycemia, unspecified: Secondary | ICD-10-CM | POA: Diagnosis not present

## 2021-05-25 DIAGNOSIS — R110 Nausea: Secondary | ICD-10-CM | POA: Insufficient documentation

## 2021-05-25 DIAGNOSIS — R42 Dizziness and giddiness: Secondary | ICD-10-CM | POA: Insufficient documentation

## 2021-05-25 DIAGNOSIS — Z7984 Long term (current) use of oral hypoglycemic drugs: Secondary | ICD-10-CM | POA: Diagnosis not present

## 2021-05-25 DIAGNOSIS — E119 Type 2 diabetes mellitus without complications: Secondary | ICD-10-CM | POA: Diagnosis not present

## 2021-05-25 DIAGNOSIS — R55 Syncope and collapse: Secondary | ICD-10-CM | POA: Diagnosis not present

## 2021-05-25 DIAGNOSIS — Z955 Presence of coronary angioplasty implant and graft: Secondary | ICD-10-CM | POA: Diagnosis not present

## 2021-05-25 DIAGNOSIS — Z79899 Other long term (current) drug therapy: Secondary | ICD-10-CM | POA: Diagnosis not present

## 2021-05-25 DIAGNOSIS — I959 Hypotension, unspecified: Secondary | ICD-10-CM | POA: Diagnosis not present

## 2021-05-25 DIAGNOSIS — R231 Pallor: Secondary | ICD-10-CM | POA: Diagnosis not present

## 2021-05-25 DIAGNOSIS — Z96651 Presence of right artificial knee joint: Secondary | ICD-10-CM | POA: Insufficient documentation

## 2021-05-25 DIAGNOSIS — I2510 Atherosclerotic heart disease of native coronary artery without angina pectoris: Secondary | ICD-10-CM | POA: Insufficient documentation

## 2021-05-25 DIAGNOSIS — M25561 Pain in right knee: Secondary | ICD-10-CM | POA: Diagnosis not present

## 2021-05-25 LAB — BASIC METABOLIC PANEL
Anion gap: 9 (ref 5–15)
BUN: 21 mg/dL (ref 8–23)
CO2: 25 mmol/L (ref 22–32)
Calcium: 9.4 mg/dL (ref 8.9–10.3)
Chloride: 100 mmol/L (ref 98–111)
Creatinine, Ser: 1.1 mg/dL (ref 0.61–1.24)
GFR, Estimated: >60 mL/min (ref >60)
Glucose, Bld: 213 mg/dL — ABNORMAL HIGH (ref 70–99)
Potassium: 5.1 mmol/L (ref 3.5–5.1)
Sodium: 134 mmol/L — ABNORMAL LOW (ref 135–145)

## 2021-05-25 LAB — CBC
HCT: 40.2 % (ref 39.0–52.0)
Hemoglobin: 13.3 g/dL (ref 13.0–17.0)
MCH: 32.5 pg (ref 26.0–34.0)
MCHC: 33.1 g/dL (ref 30.0–36.0)
MCV: 98.3 fL (ref 80.0–100.0)
Platelets: 429 10*3/uL — ABNORMAL HIGH (ref 150–400)
RBC: 4.09 MIL/uL — ABNORMAL LOW (ref 4.22–5.81)
RDW: 12.9 % (ref 11.5–15.5)
WBC: 13.5 10*3/uL — ABNORMAL HIGH (ref 4.0–10.5)
nRBC: 0 % (ref 0.0–0.2)

## 2021-05-25 MED ORDER — SODIUM CHLORIDE 0.9 % IV SOLN
1000.0000 mL | INTRAVENOUS | Status: DC
  Administered 2021-05-25: 1000 mL via INTRAVENOUS

## 2021-05-25 MED ORDER — SODIUM CHLORIDE 0.9 % IV BOLUS (SEPSIS)
500.0000 mL | Freq: Once | INTRAVENOUS | Status: AC
  Administered 2021-05-25: 500 mL via INTRAVENOUS

## 2021-05-25 NOTE — Discharge Instructions (Addendum)
Continue your current medications.  Return to the ED if you have recurrent symptoms fevers chills vomiting or diarrhea.

## 2021-05-25 NOTE — Telephone Encounter (Signed)
Pt called stating that he went to the ED for being lightheaded but did not pass out. Pt stated that he had knee surgery recently. Pt stated that the ED told him that Dr [name] has to give the approval to go back to [facility]. He stated that we would have to call [facility]. Phone number is [phone]. Does pt need an OV? Please Advise.

## 2021-05-25 NOTE — ED Triage Notes (Signed)
Pt BIB [facility] from orthopedics specialist due to near syncope episode. Physival therapist at the facility states that patient BP was soft. Patient was clammy but denies chest pains and SOB. Patient recently had right side knee replacement. Patient does states feeling weaker than normal.   Vitals were:   103/80 98% RA 62-HR 14-RR

## 2021-05-25 NOTE — ED Provider Notes (Signed)
[facility] DEPT Provider Note   CSN: [id number] Arrival date & time: 05/25/21  1054     History Chief complaint: Low blood pressure  [name] [name] is a 70 y.o. male.  HPI  Patient presents to the ED for evaluation of an episode of near syncope.  Patient states he was at physical therapy today after recently having a total knee replacement on November 15.  Patient states he was doing an exercise that cause some sharp knee discomfort.  He immediately started to feel somewhat lightheaded and nauseous.  He felt diaphoretic and thought he was going to pass out.  Staff took his blood pressure and they noted it to be low.  EMS was called and the patient was transported to the ED.  Patient states his symptoms have now resolved and he is feeling better.  He has not had issues with fevers or chills.  No vomiting or diarrhea.  He denies any chest pain or shortness of breath.  He has not noticed any blood in his stool or dark stools.  Patient states he does have a history of any episodes of near syncope with IV insertion in the past.  This felt very similar to that.  Past Medical History:  Diagnosis Date   Adenomatous colon polyp 09/07/2004   Amblyopia    CAD (coronary artery disease)    cardiac CT with a 50% LAD lesion in 2007   Diverticulosis    DM2 (diabetes mellitus, type 2) (HCC)    Elevated LFTs    secondary to labetalol   Esophageal stricture 2003   External hemorrhoids 2012   lanced by Dr Rise Patience   Family history of anesthesia complication    "sister had PONV"   GERD (gastroesophageal reflux disease)    esophageal stricture, pmh   Hiatal hernia 01/04/2000   History of kidney stones 2022   HLD (hyperlipidemia)    HTN (hypertension)    Hypopotassemia     Patient Active Problem List   Diagnosis Date Noted   S/P total knee arthroplasty, 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   Nephrolithiasis 03/05/2020   Labral tear of long head of biceps tendon, left, sequela     Complete tear of left rotator cuff    Diverticulosis of colon (without mention of hemorrhage) 04/25/2011   Adenomatous colon polyp 03/17/2011   LOW BACK PAIN, CHRONIC 12/13/2007   Hyperlipidemia associated with type 2 diabetes mellitus ([place]) 06/08/2007   T2DM (type 2 diabetes mellitus) ([place]) 02/27/2007   Hypertension associated with diabetes ([place]) 02/27/2007   AMBLYOPIA 12/06/2006   GERD 12/06/2006    Past Surgical History:  Procedure Laterality Date   COLONOSCOPY  06/27/2004   tics , ADENOMATOUS polyps. F/U due 2011   COLONOSCOPY  06/27/2010   Tics   CORONARY ANGIOPLASTY WITH STENT PLACEMENT  04/01/2014   "2"   ESOPHAGEAL DILATION  06/27/2001   ESOPHAGOGASTRODUODENOSCOPY  06/27/2001   ERD, stricture dilated   EXCISIONAL HEMORRHOIDECTOMY  ~ 2008   LEFT HEART CATHETERIZATION WITH CORONARY ANGIOGRAM N/A 03/26/2014   Procedure: LEFT HEART CATHETERIZATION WITH CORONARY ANGIOGRAM;  Surgeon: Peter M Martinique, MD;  Location: [facility] CATH LAB;  Service: Cardiovascular;  Laterality: N/A;   PERCUTANEOUS CORONARY STENT INTERVENTION (PCI-S) N/A 04/01/2014   Procedure: PERCUTANEOUS CORONARY STENT INTERVENTION (PCI-S);  Surgeon: Peter M Martinique, MD;  Location: [facility] CATH LAB;  Service: Cardiovascular;  Laterality: N/A;   POSTERIOR LAMINECTOMY / DECOMPRESSION LUMBAR SPINE  12/25/2009   L4 , Dr Sherwood Gambler   SHOULDER ARTHROSCOPY WITH  ROTATOR CUFF REPAIR AND OPEN BICEPS TENODESIS Left 02/04/2019   Procedure: LEFT SHOULDER ARTHROSCOPY, BICEPS TENODESIS, MINI OPEN ROTATOR CUFF TEAR REPAIR;  Surgeon: Meredith Pel, MD;  Location: [facility];  Service: Orthopedics;  Laterality: Left;   TOTAL KNEE ARTHROPLASTY Right 05/11/2021   Procedure: TOTAL KNEE ARTHROPLASTY;  Surgeon: Paralee Cancel, MD;  Location: [facility];  Service: Orthopedics;  Laterality: Right;       Family History  Problem Relation Age of Onset   Heart attack Mother 35   Stroke Mother 50   Prostate cancer Father    Diabetes  Father    Heart disease Father 67       CBAG    Diabetes Brother    Heart attack Brother    Breast cancer Maternal Aunt    Heart attack Maternal Grandfather 61   Diabetes Paternal Grandmother    Stroke Paternal Grandfather 38   Heart disease Sister     Social History   Tobacco Use   Smoking status: Never   Smokeless tobacco: Never  Vaping Use   Vaping Use: Never used  Substance Use Topics   Alcohol use: Yes    Alcohol/week: 2.0 standard drinks    Types: 2 Cans of beer per week    Comment: social   Drug use: No    Home Medications Prior to Admission medications   Medication Sig Start Date End Date Taking? Authorizing Provider  atorvastatin (LIPITOR) 80 MG tablet TAKE 1 TABLET DAILY AT     6:00PM 03/10/21   Vivi Barrack, MD  carvedilol (COREG) 3.125 MG tablet TAKE 1 TABLET TWICE DAILY  WITH MEALS. 03/10/21   Vivi Barrack, MD  clopidogrel (PLAVIX) 75 MG tablet TAKE 1 TABLET DAILY 03/10/21   Vivi Barrack, MD  Coenzyme Q10 200 MG capsule Take 200 mg by mouth daily.    [provider]  docusate sodium (COLACE) 100 MG capsule Take 1 capsule (100 mg total) by mouth 2 (two) times daily. 05/12/21   [name], PA-C  famotidine-calcium carbonate-magnesium hydroxide (PEPCID COMPLETE) 10-800-165 MG chewable tablet Chew 1 tablet by mouth daily as needed (heartburn).    [provider]  glucose blood (ONE TOUCH ULTRA TEST) test strip Use one strip per test. Test blood sugars 1-4 times daily as instructed. 01/19/17   Golden Circle, FNP  Homeopathic Products [facility] ALLERGY RELIEF NA) Place 1 spray into the nose daily as needed (allergies).    [provider]  HYDROcodone-acetaminophen (NORCO) 7.5-325 MG tablet Take 1-2 tablets by mouth every 4 (four) hours as needed for severe pain (pain score 7-10). 05/12/21   [name], PA-C  irbesartan (AVAPRO) 300 MG tablet TAKE 1 TABLET DAILY. 03/10/21   Vivi Barrack, MD  metFORMIN (GLUCOPHAGE-XR) 500  MG 24 hr tablet Take 2 tablet (1000Mg  total) by mouth daily after supper 07/23/20   Vivi Barrack, MD  methocarbamol (ROBAXIN) 500 MG tablet Take 1 tablet (500 mg total) by mouth every 6 (six) hours as needed for muscle spasms. 05/12/21   [name], PA-C  Misc Natural Products (GLUCOSAMINE CHOND COMPLEX/MSM) TABS Take 1 tablet by mouth daily. 11/01/18   [provider]  Multiple Vitamins-Minerals (MULTIVITAMIN WITH MINERALS) tablet Take 1 tablet by mouth daily.    [provider]  nitroGLYCERIN (NITROSTAT) 0.4 MG SL tablet Place 1 tablet (0.4 mg total) under the tongue every 5 (five) minutes as needed for chest pain. 08/24/17   Martinique,  Ander Slade, MD  polyethylene glycol (MIRALAX / GLYCOLAX) 17 g packet Take 17 g by mouth daily as needed for mild constipation. 05/12/21   [name], PA-C  PROTEIN PO Take 1 Container by mouth 2 (two) times a week.    [provider]  sildenafil (REVATIO) 20 MG tablet Take 1-5 tablets by mouth daily as needed. 07/23/19   Vivi Barrack, MD  spironolactone (ALDACTONE) 25 MG tablet TAKE 1 TABLET DAILY 03/10/21   Vivi Barrack, MD  Undecylenic Ac-Zn Undecylenate (FUNGI-NAIL TOE & FOOT EX) Apply 1 application topically every other day.    [provider]  vitamin B-12 (CYANOCOBALAMIN) 1000 MCG tablet Take 1,000 mcg by mouth daily.    [provider]    Allergies    Aspirin, Other, Oxycodone, Contrast media [iodinated diagnostic agents], and Norvasc [amlodipine besylate]  Review of Systems   Review of Systems  All other systems reviewed and are negative.  Physical Exam Updated Vital Signs BP 127/79   Pulse 62   Temp 97.8 F (36.6 C) (Oral)   Resp 14   Ht 1.746 m (5' 8.75")   Wt 84 kg   SpO2 98%   BMI 27.55 kg/m   Physical Exam Vitals and nursing note reviewed.  Constitutional:      General: He is not in acute distress.    Appearance: He is well-developed.  HENT:     Head: Normocephalic and  atraumatic.     Right Ear: External ear normal.     Left Ear: External ear normal.  Eyes:     General: No scleral icterus.       Right eye: No discharge.        Left eye: No discharge.     Conjunctiva/sclera: Conjunctivae normal.  Neck:     Trachea: No tracheal deviation.  Cardiovascular:     Rate and Rhythm: Normal rate and regular rhythm.  Pulmonary:     Effort: Pulmonary effort is normal. No respiratory distress.     Breath sounds: Normal breath sounds. No stridor. No wheezing or rales.  Abdominal:     General: Bowel sounds are normal. There is no distension.     Palpations: Abdomen is soft.     Tenderness: There is no abdominal tenderness. There is no guarding or rebound.  Musculoskeletal:        General: No tenderness or deformity.     Cervical back: Neck supple.     Right knee: Effusion present.     Comments: Surgical dressing in place over right knee, no increased warmth  Skin:    General: Skin is warm and dry.     Findings: No rash.  Neurological:     General: No focal deficit present.     Mental Status: He is alert.     Cranial Nerves: No cranial nerve deficit (no facial droop, extraocular movements intact, no slurred speech).     Sensory: No sensory deficit.     Motor: No abnormal muscle tone or seizure activity.     Coordination: Coordination normal.  Psychiatric:        Mood and Affect: Mood normal.    ED Results / Procedures / Treatments   Labs (all labs ordered are listed, but only abnormal results are displayed) Labs Reviewed  CBC - Abnormal; Notable for the following components:      Result Value   WBC 13.5 (*)    RBC 4.09 (*)    Platelets 429 (*)  All other components within normal limits  BASIC METABOLIC PANEL - Abnormal; Notable for the following components:   Sodium 134 (*)    Glucose, Bld 213 (*)    All other components within normal limits    EKG EKG Interpretation  Date/Time:  Tuesday May 25 2021 12:56:02 EST Ventricular Rate:   66 PR Interval:  160 QRS Duration: 89 QT Interval:  425 QTC Calculation: 446 R Axis:   19 Text Interpretation: Sinus rhythm Minimal ST elevation, inferior leads Since last tracing rate faster Confirmed by Dorie Rank [phone] on 05/25/2021 1:00:29 PM  Radiology No results found.  Procedures Procedures   Medications Ordered in ED Medications  sodium chloride 0.9 % bolus 500 mL (0 mLs Intravenous Stopped 05/25/21 1314)    Followed by  0.9 %  sodium chloride infusion (0 mLs Intravenous Stopped 05/25/21 1314)    ED Course  I have reviewed the triage vital signs and the nursing notes.  Pertinent labs & imaging results that were available during my care of the patient were reviewed by me and considered in my medical decision making (see chart for details).  Clinical Course as of 05/25/21 1536  Tue May 25, 2021  1258 Electrolyte panel shows mild hyperglycemia.  White blood cell count slightly elevated at 13 [JK]    Clinical Course User Index [JK] Dorie Rank, MD   MDM Rules/Calculators/A&P                           Patient presented to the ER for evaluation of a syncopal episode.  Patient had been overall feeling well until the event this morning.  It was preceded by prodromal symptoms of nausea feeling clammy.  Patient did experience acute knee pain prior to the episode.  By time patient arrived in the ED all symptoms have resolved.  He was normotensive.  No bradycardia.  No dysrhythmia.  ED work-up was reassuring.  No dehydration no anemia.  Suspect patient had a vasovagal episode.  He has had them before.  Patient was feeling well and was comfortable with discharge.  Warning signs precautions discussed Final Clinical Impression(s) / ED Diagnoses Final diagnoses:  Vasovagal syncope    Rx / DC Orders ED Discharge Orders     None        Dorie Rank, MD 05/25/21 1536

## 2021-05-26 NOTE — Telephone Encounter (Signed)
Patient is calling in stating that he needs a release to return back to PT at [facility]. Has an appointment tomorrow at 9:30 am, and in order to keep that appointment he needs this letter.

## 2021-05-26 NOTE — Telephone Encounter (Signed)
I am not sure I understand. If he has already been seen at [facility] then he can call there to schedule a follow up appointment.  Algis Greenhouse. [name], MD 05/26/2021 10:18 AM

## 2021-05-26 NOTE — Telephone Encounter (Signed)
Please advise 

## 2021-05-27 DIAGNOSIS — M25561 Pain in right knee: Secondary | ICD-10-CM | POA: Diagnosis not present

## 2021-05-27 NOTE — Telephone Encounter (Signed)
Received a message from [place] at [place] office, they do not need any clarification letter.

## 2021-06-01 ENCOUNTER — Telehealth: Payer: Self-pay | Admitting: *Deleted

## 2021-06-01 DIAGNOSIS — M25561 Pain in right knee: Secondary | ICD-10-CM | POA: Diagnosis not present

## 2021-06-01 NOTE — Telephone Encounter (Signed)
Would probably be better for him to talk half a pill twice daily instead.  Recommend he schedule an office visit soon.  Algis Greenhouse. [name], MD 06/01/2021 3:28 PM

## 2021-06-01 NOTE — Telephone Encounter (Signed)
Blood pressure running low in the morning  This morning was 97/70, feeling light header when BP low Patient stated if is ok to take Rx Coreg daily instead of BID

## 2021-06-02 ENCOUNTER — Telehealth: Payer: Self-pay | Admitting: Pharmacist

## 2021-06-02 NOTE — Telephone Encounter (Signed)
Spoke with patient patient stated pill to small to split in half BP today at 8:14  98/69 HR 73         9:27   97/71 HR 89       10:26  106/70 HR 92 Per Dr [name], not recommendable to take one pill, but can try  Patient has appointment on Monday to F/U BP

## 2021-06-02 NOTE — Chronic Care Management (AMB) (Signed)
Chronic Care Management Pharmacy Assistant   Name: [name] [name]  MRN: [id number] DOB: 11-24-1950   Reason for Encounter: General Adherence Call    Recent office visits:  None  Recent consult visits:  None  Hospital visits:  05/25/2021 ED visit for Vasovagal syncope Symptoms resolved No medication changes indicated.  Orthopedic Surgery - Total Knee Arthroplasty (Right) Admit date: 05/11/2021 Discharge date: 05/13/2021 - Stop taking Acetaminophen 500 mg tablet - Start Hydrocodone-Acetaminophen 7.5-325 mg q4hrs prn - Continue all other medications as prescribed   Medications: Outpatient Encounter Medications as of 06/02/2021  Medication Sig Note   atorvastatin (LIPITOR) 80 MG tablet TAKE 1 TABLET DAILY AT     6:00PM    carvedilol (COREG) 3.125 MG tablet TAKE 1 TABLET TWICE DAILY  WITH MEALS.    clopidogrel (PLAVIX) 75 MG tablet TAKE 1 TABLET DAILY    Coenzyme Q10 200 MG capsule Take 200 mg by mouth daily.    docusate sodium (COLACE) 100 MG capsule Take 1 capsule (100 mg total) by mouth 2 (two) times daily.    famotidine-calcium carbonate-magnesium hydroxide (PEPCID COMPLETE) 10-800-165 MG chewable tablet Chew 1 tablet by mouth daily as needed (heartburn).    glucose blood (ONE TOUCH ULTRA TEST) test strip Use one strip per test. Test blood sugars 1-4 times daily as instructed.    Homeopathic Products (ZICAM ALLERGY RELIEF NA) Place 1 spray into the nose daily as needed (allergies).    HYDROcodone-acetaminophen (NORCO) 7.5-325 MG tablet Take 1-2 tablets by mouth every 4 (four) hours as needed for severe pain (pain score 7-10).    irbesartan (AVAPRO) 300 MG tablet TAKE 1 TABLET DAILY.    metFORMIN (GLUCOPHAGE-XR) 500 MG 24 hr tablet Take 2 tablet (1000Mg  total) by mouth daily after supper    methocarbamol (ROBAXIN) 500 MG tablet Take 1 tablet (500 mg total) by mouth every 6 (six) hours as needed for muscle spasms.    Misc Natural Products (GLUCOSAMINE CHOND COMPLEX/MSM)  TABS Take 1 tablet by mouth daily.    Multiple Vitamins-Minerals (MULTIVITAMIN WITH MINERALS) tablet Take 1 tablet by mouth daily.    nitroGLYCERIN (NITROSTAT) 0.4 MG SL tablet Place 1 tablet (0.4 mg total) under the tongue every 5 (five) minutes as needed for chest pain. 05/11/2021: never   polyethylene glycol (MIRALAX / GLYCOLAX) 17 g packet Take 17 g by mouth daily as needed for mild constipation.    PROTEIN PO Take 1 Container by mouth 2 (two) times a week.    sildenafil (REVATIO) 20 MG tablet Take 1-5 tablets by mouth daily as needed.    spironolactone (ALDACTONE) 25 MG tablet TAKE 1 TABLET DAILY    Undecylenic Ac-Zn Undecylenate (FUNGI-NAIL TOE & FOOT EX) Apply 1 application topically every other day.    vitamin B-12 (CYANOCOBALAMIN) 1000 MCG tablet Take 1,000 mcg by mouth daily.    No facility-administered encounter medications on file as of 06/02/2021.   Patient Questions:  Have you had any problems recently with your health? -Patient states when he gets up in the mornings he doesn't feel well. He checked his blood pressure, today's readings: 98/69 HR 73, 98/71 HR 87, 106/70 HR 92, 107/69. He states he feels weak and shaky. He hasn't been eating well so therefore has recently lost 12 lbs.  -Patient states he doesn't have the energy to do anything and doesn't want to go to physical therapy.  -He also complains of dark stools and frequent urination.  -He has not been checking his blood  sugars. He states he hasn't had anything to check his blood sugars with. He states he needs a new glucometer and strips.  Have you had any problems with your pharmacy? Patient states he hasn't had any problems recently with his pharmacy.  What issues or side effects are you having with your medications? Patient states the only issue he is experiencing is low blood pressure, fatigue and weakness. He's also having dark stool and frequent urination.  What would you like me to pass along to Leata Mouse,  CPP for him to help you with?  Patient states he would like a new meter with supplies so he can check his blood sugars.  What can we do to take care of you better? Patient did not have any suggestions. He is happy with his current level of care.   Care Gaps: Medicare Annual Wellness: Completed 03/30/2021 Ophthalmology Exam: Next due on 04/28/2022 Foot Exam: Overdue since 02/21/2020 Hemoglobin A1C: 7.3% on 04/30/2021 Colonoscopy: Overdue since 04/24/2016  Future Appointments  Date Time Provider [place]  06/07/2021  1:20 PM Vivi Barrack, MD [facility]  09/08/2021  9:20 AM Vivi Barrack, MD [facility]  10/04/2021  3:00 PM [facility] PHARMACIST [facility]    Star Rating Drugs: Atorvastatin 80 mg last filled 03/16/2021 90 DS Irbesartan 300 mg last filled 03/16/2021 90 DS Metformin ER 500 mg last filled 04/12/2021 90 DS  [name], [place] Pharmacist Assistant [phone]

## 2021-06-03 NOTE — Telephone Encounter (Signed)
Recommend he follow up with [place] soon.  Algis Greenhouse. [name], MD 06/03/2021 9:13 AM

## 2021-06-04 ENCOUNTER — Other Ambulatory Visit: Payer: Self-pay

## 2021-06-04 ENCOUNTER — Ambulatory Visit: Payer: Medicare Other | Admitting: Family Medicine

## 2021-06-04 ENCOUNTER — Observation Stay (HOSPITAL_COMMUNITY)
Admission: EM | Admit: 2021-06-04 | Discharge: 2021-06-05 | Disposition: A | Discharge status: Home / Self Care | Payer: Medicare Other | Attending: Internal Medicine | Admitting: Internal Medicine

## 2021-06-04 ENCOUNTER — Telehealth: Payer: Self-pay

## 2021-06-04 ENCOUNTER — Encounter (HOSPITAL_COMMUNITY): Payer: Self-pay

## 2021-06-04 DIAGNOSIS — D649 Anemia, unspecified: Secondary | ICD-10-CM | POA: Diagnosis not present

## 2021-06-04 DIAGNOSIS — Z20822 Contact with and (suspected) exposure to covid-19: Secondary | ICD-10-CM | POA: Insufficient documentation

## 2021-06-04 DIAGNOSIS — I2510 Atherosclerotic heart disease of native coronary artery without angina pectoris: Secondary | ICD-10-CM | POA: Insufficient documentation

## 2021-06-04 DIAGNOSIS — E785 Hyperlipidemia, unspecified: Secondary | ICD-10-CM | POA: Insufficient documentation

## 2021-06-04 DIAGNOSIS — K269 Duodenal ulcer, unspecified as acute or chronic, without hemorrhage or perforation: Secondary | ICD-10-CM | POA: Diagnosis not present

## 2021-06-04 DIAGNOSIS — I10 Essential (primary) hypertension: Secondary | ICD-10-CM | POA: Diagnosis not present

## 2021-06-04 DIAGNOSIS — K922 Gastrointestinal hemorrhage, unspecified: Secondary | ICD-10-CM | POA: Diagnosis not present

## 2021-06-04 DIAGNOSIS — K260 Acute duodenal ulcer with hemorrhage: Secondary | ICD-10-CM

## 2021-06-04 DIAGNOSIS — E119 Type 2 diabetes mellitus without complications: Secondary | ICD-10-CM | POA: Diagnosis not present

## 2021-06-04 DIAGNOSIS — K921 Melena: Secondary | ICD-10-CM

## 2021-06-04 DIAGNOSIS — Z79899 Other long term (current) drug therapy: Secondary | ICD-10-CM | POA: Diagnosis not present

## 2021-06-04 DIAGNOSIS — R531 Weakness: Secondary | ICD-10-CM | POA: Diagnosis not present

## 2021-06-04 LAB — CBC WITH DIFFERENTIAL/PLATELET
Abs Immature Granulocytes: 0.06 10*3/uL (ref 0.00–0.07)
Basophils Absolute: 0 10*3/uL (ref 0.0–0.1)
Basophils Relative: 0 %
Eosinophils Absolute: 0 10*3/uL (ref 0.0–0.5)
Eosinophils Relative: 0 %
HCT: 25.5 % — ABNORMAL LOW (ref 39.0–52.0)
Hemoglobin: 8.5 g/dL — ABNORMAL LOW (ref 13.0–17.0)
Immature Granulocytes: 1 %
Lymphocytes Relative: 4 %
Lymphs Abs: 0.6 10*3/uL — ABNORMAL LOW (ref 0.7–4.0)
MCH: 32.6 pg (ref 26.0–34.0)
MCHC: 33.3 g/dL (ref 30.0–36.0)
MCV: 97.7 fL (ref 80.0–100.0)
Monocytes Absolute: 0.6 10*3/uL (ref 0.1–1.0)
Monocytes Relative: 5 %
Neutro Abs: 12 10*3/uL — ABNORMAL HIGH (ref 1.7–7.7)
Neutrophils Relative %: 90 %
Platelets: 409 10*3/uL — ABNORMAL HIGH (ref 150–400)
RBC: 2.61 MIL/uL — ABNORMAL LOW (ref 4.22–5.81)
RDW: 12.9 % (ref 11.5–15.5)
WBC: 13.3 10*3/uL — ABNORMAL HIGH (ref 4.0–10.5)
nRBC: 0 % (ref 0.0–0.2)

## 2021-06-04 LAB — PROTIME-INR
INR: 1 (ref 0.8–1.2)
Prothrombin Time: 13.4 seconds (ref 11.4–15.2)

## 2021-06-04 LAB — URINALYSIS, ROUTINE W REFLEX MICROSCOPIC
Bilirubin Urine: NEGATIVE
Glucose, UA: 150 mg/dL — AB
Hgb urine dipstick: NEGATIVE
Ketones, ur: 5 mg/dL — AB
Leukocytes,Ua: NEGATIVE
Nitrite: NEGATIVE
Protein, ur: NEGATIVE mg/dL
Specific Gravity, Urine: 1.026 (ref 1.005–1.030)
pH: 5 (ref 5.0–8.0)

## 2021-06-04 LAB — COMPREHENSIVE METABOLIC PANEL
ALT: 20 U/L (ref 0–44)
AST: 17 U/L (ref 15–41)
Albumin: 3.5 g/dL (ref 3.5–5.0)
Alkaline Phosphatase: 84 U/L (ref 38–126)
Anion gap: 10 (ref 5–15)
BUN: 42 mg/dL — ABNORMAL HIGH (ref 8–23)
CO2: 22 mmol/L (ref 22–32)
Calcium: 8.9 mg/dL (ref 8.9–10.3)
Chloride: 102 mmol/L (ref 98–111)
Creatinine, Ser: 1.04 mg/dL (ref 0.61–1.24)
GFR, Estimated: >60 mL/min (ref >60)
Glucose, Bld: 255 mg/dL — ABNORMAL HIGH (ref 70–99)
Potassium: 4.5 mmol/L (ref 3.5–5.1)
Sodium: 134 mmol/L — ABNORMAL LOW (ref 135–145)
Total Bilirubin: 0.7 mg/dL (ref 0.3–1.2)
Total Protein: 6.3 g/dL — ABNORMAL LOW (ref 6.5–8.1)

## 2021-06-04 LAB — HEMOGLOBIN
Hemoglobin: 7.7 g/dL — ABNORMAL LOW (ref 13.0–17.0)
Hemoglobin: 9.2 g/dL — ABNORMAL LOW (ref 13.0–17.0)

## 2021-06-04 LAB — RESP PANEL BY RT-PCR (FLU A&B, COVID) ARPGX2
Influenza A by PCR: NEGATIVE
Influenza B by PCR: NEGATIVE
SARS Coronavirus 2 by RT PCR: NEGATIVE

## 2021-06-04 LAB — POC OCCULT BLOOD, ED: Fecal Occult Bld: POSITIVE — AB

## 2021-06-04 LAB — LIPASE, BLOOD: Lipase: 49 U/L (ref 11–51)

## 2021-06-04 LAB — PREPARE RBC (CROSSMATCH)

## 2021-06-04 MED ORDER — SODIUM CHLORIDE 0.9% IV SOLUTION: Freq: Once | INTRAVENOUS | Status: AC

## 2021-06-04 MED ORDER — PANTOPRAZOLE SODIUM 40 MG IV SOLR
40.0000 mg | Freq: Two times a day (BID) | INTRAVENOUS | Status: DC
  Administered 2021-06-04: 40 mg via INTRAVENOUS
  Filled 2021-06-04: qty 40

## 2021-06-04 MED ORDER — PANTOPRAZOLE SODIUM 40 MG IV SOLR
40.0000 mg | Freq: Once | INTRAVENOUS | Status: AC
  Administered 2021-06-04: 40 mg via INTRAVENOUS
  Filled 2021-06-04: qty 40

## 2021-06-04 NOTE — ED Triage Notes (Signed)
Pt arrived via EMS, from home generalized wkn x2 wks. Dark stool x1 week.

## 2021-06-04 NOTE — Telephone Encounter (Signed)
Patient has an appointment today with PCP.

## 2021-06-04 NOTE — H&P (Signed)
History and Physical    [name]:[id number] DOB: [birth year] DOA: 06/04/2021  PCP: Vivi Barrack, MD  Patient coming from: Home  Chief Complaint: dark stools  HPI: [name] [name] is a 70 y.o. male with medical history significant of HTN, DM2, HLD, CAD on plavix. Presenting with dark stools and dizziness. He reports that he had a recent knee surgery. He was discharged to home and started outpt rehab. He was doing well for the first several days. However, he noted that he was having dark stools. At one session a couple of weeks ago, he had severe pain in his knee and passed it. He was taken to the ED and diagnosed with a vasovagal syncope. At that time his Hgb was 13.3. He did not mention at the time that he was having dark stools. The dark stools continued. He noticed around that same time he was having several episodes of dizziness daily. He didn't have any more passing out episodes, but he did feel more and more fatigued. When he woke this morning, he felt exceedingly. He had additional dark stools. So he decided to come to the ED.     ED Course: His Hgb was 8.5. He was FOBT positive. [facility] was consulted. [facility] was called for admission.   Review of Systems:  Denies CP, palpitations, V/F, abdominal pain, rectal pain. Review of systems is otherwise negative for all not mentioned in HPI.   PMHx Past Medical History:  Diagnosis Date   Adenomatous colon polyp 09/07/2004   Amblyopia    CAD (coronary artery disease)    cardiac CT with a 50% LAD lesion in 2007   Diverticulosis    DM2 (diabetes mellitus, type 2) (HCC)    Elevated LFTs    secondary to labetalol   Esophageal stricture 2003   External hemorrhoids 2012   lanced by Dr Rise Patience   Family history of anesthesia complication    "sister had PONV"   GERD (gastroesophageal reflux disease)    esophageal stricture, pmh   Hiatal hernia 01/04/2000   History of kidney stones 2022   HLD (hyperlipidemia)    HTN (hypertension)     Hypopotassemia     PSHx Past Surgical History:  Procedure Laterality Date   COLONOSCOPY  06/27/2004   tics , ADENOMATOUS polyps. F/U due 2011   COLONOSCOPY  06/27/2010   Tics   CORONARY ANGIOPLASTY WITH STENT PLACEMENT  04/01/2014   "2"   ESOPHAGEAL DILATION  06/27/2001   ESOPHAGOGASTRODUODENOSCOPY  06/27/2001   ERD, stricture dilated   EXCISIONAL HEMORRHOIDECTOMY  ~ 2008   LEFT HEART CATHETERIZATION WITH CORONARY ANGIOGRAM N/A 03/26/2014   Procedure: LEFT HEART CATHETERIZATION WITH CORONARY ANGIOGRAM;  Surgeon: Peter M Martinique, MD;  [facility] CATH LAB;  Service: Cardiovascular;  Laterality: N/A;   PERCUTANEOUS CORONARY STENT INTERVENTION (PCI-S) N/A 04/01/2014   Procedure: PERCUTANEOUS CORONARY STENT INTERVENTION (PCI-S);  Surgeon: Peter M Martinique, MD;  Location: [facility] CATH LAB;  Service: Cardiovascular;  Laterality: N/A;   POSTERIOR LAMINECTOMY / DECOMPRESSION LUMBAR SPINE  12/25/2009   L4 , Dr Sherwood Gambler   SHOULDER ARTHROSCOPY WITH ROTATOR CUFF REPAIR AND OPEN BICEPS TENODESIS Left 02/04/2019   Procedure: LEFT SHOULDER ARTHROSCOPY, BICEPS TENODESIS, MINI OPEN ROTATOR CUFF TEAR REPAIR;  Surgeon: Meredith Pel, MD;  Location: [place];  Service: Orthopedics;  Laterality: Left;   TOTAL KNEE ARTHROPLASTY Right 05/11/2021   Procedure: TOTAL KNEE ARTHROPLASTY;  Surgeon: Paralee Cancel, MD;  Location: [facility];  Service: Orthopedics;  Laterality: Right;    SocHx  reports that he has never smoked. He has never used smokeless tobacco. He reports current alcohol use of about 2.0 standard drinks per week. He reports that he does not use drugs.  Allergies  Allergen Reactions   Aspirin Hives, Swelling and Other (See Comments)    Angioedema   Oxycodone Nausea Only   Contrast Media [Iodinated Diagnostic Agents] Hives, Itching, Rash and Other (See Comments)    Developed hives despite pre-treatment   Norvasc [Amlodipine Besylate] Other (See Comments) and Cough    09/12/2013  gingival hyperplasia , Dr Geralynn Ochs ,DDS    FamHx Family History  Problem Relation Age of Onset   Heart attack Mother 53   Stroke Mother 68   Prostate cancer Father    Diabetes Father    Heart disease Father 29       CBAG    Diabetes Brother    Heart attack Brother    Breast cancer Maternal Aunt    Heart attack Maternal Grandfather 61   Diabetes Paternal Grandmother    Stroke Paternal Grandfather 87   Heart disease Sister     Prior to Admission medications   Medication Sig Start Date End Date Taking? Authorizing Provider  atorvastatin (LIPITOR) 80 MG tablet TAKE 1 TABLET DAILY AT     6:00PM Patient taking differently: Take 80 mg by mouth daily. 03/10/21  Yes Vivi Barrack, MD  carvedilol (COREG) 3.125 MG tablet TAKE 1 TABLET TWICE DAILY  WITH MEALS. Patient taking differently: Take 3.125 mg by mouth at bedtime. TAKE 1 TABLET TWICE DAILY  WITH MEALS 03/10/21  Yes Vivi Barrack, MD  clopidogrel (PLAVIX) 75 MG tablet TAKE 1 TABLET DAILY Patient taking differently: Take 75 mg by mouth in the morning. 03/10/21  Yes Vivi Barrack, MD  Coenzyme Q10 200 MG capsule Take 200 mg by mouth daily.   Yes [provider]  famotidine-calcium carbonate-magnesium hydroxide (PEPCID COMPLETE) 10-800-165 MG chewable tablet Chew 0.5 tablets by mouth every 6 (six) hours as needed (heartburn).   Yes [provider]  Homeopathic Products (ZICAM ALLERGY RELIEF NA) Place 1 spray into the nose daily as needed (colds).   Yes [provider]  HYDROcodone-acetaminophen (NORCO) 7.5-325 MG tablet Take 1-2 tablets by mouth every 4 (four) hours as needed for severe pain (pain score 7-10). Patient taking differently: Take 1 tablet by mouth in the morning and at bedtime. 05/12/21  Yes [name], PA-C  irbesartan (AVAPRO) 300 MG tablet TAKE 1 TABLET DAILY. Patient taking differently: Take 300 mg by mouth at bedtime. 03/10/21  Yes Vivi Barrack, MD  metFORMIN (GLUCOPHAGE-XR) 500 MG 24  hr tablet Take 2 tablet (1000Mg  total) by mouth daily after supper Patient taking differently: 1,000 mg at bedtime. 07/23/20  Yes Vivi Barrack, MD  methocarbamol (ROBAXIN) 500 MG tablet Take 1 tablet (500 mg total) by mouth every 6 (six) hours as needed for muscle spasms. Patient taking differently: Take 500 mg by mouth in the morning and at bedtime. 05/12/21  Yes [name], PA-C  Multiple Vitamins-Minerals (MULTIVITAMIN WITH MINERALS) tablet Take 1 tablet by mouth daily.   Yes [provider]  nitroGLYCERIN (NITROSTAT) 0.4 MG SL tablet Place 1 tablet (0.4 mg total) under the tongue every 5 (five) minutes as needed for chest pain. 08/24/17  Yes Martinique, Peter M, MD  sodium chloride (OCEAN) 0.65 % SOLN nasal spray Place 1 spray into both nostrils as needed for congestion.  Yes [provider]  spironolactone (ALDACTONE) 25 MG tablet TAKE 1 TABLET DAILY Patient taking differently: Take 25 mg by mouth in the morning. 03/10/21  Yes Vivi Barrack, MD  Undecylenic Ac-Zn Undecylenate (FUNGI-NAIL TOE & FOOT EX) Apply 1 application topically See admin instructions. Apply to bilateral big toes every other day   Yes [provider]  vitamin B-12 (CYANOCOBALAMIN) 1000 MCG tablet Take 1,000 mcg by mouth daily.   Yes [provider]  docusate sodium (COLACE) 100 MG capsule Take 1 capsule (100 mg total) by mouth 2 (two) times daily. Patient not taking: Reported on 06/04/2021 05/12/21   Costella Hatcher R, PA-C  glucose blood (ONE TOUCH ULTRA TEST) test strip Use one strip per test. Test blood sugars 1-4 times daily as instructed. 01/19/17   Golden Circle, FNP  polyethylene glycol (MIRALAX / GLYCOLAX) 17 g packet Take 17 g by mouth daily as needed for mild constipation. Patient not taking: Reported on 06/04/2021 05/12/21   [name], PA-C  PROTEIN PO Take 1 Container by mouth 2 (two) times a week. Patient not taking: Reported on 06/04/2021    [provider]  sildenafil (REVATIO) 20 MG tablet Take 1-5 tablets by mouth daily as needed. Patient not taking: Reported on 06/04/2021 07/23/19   Vivi Barrack, MD    Physical Exam: Vitals:   06/04/21 1315 06/04/21 1330 06/04/21 1345 06/04/21 1400  BP: 128/80 136/79 122/76 (!) 112/95  Pulse: 95 92 (!) 101 (!) 107  Resp: 15 18 16 15   Temp:      TempSrc:      SpO2: 100% 98% 100% 98%    General: 70 y.o. male resting in bed in NAD Eyes: PERRL, normal sclera ENMT: Nares patent w/o discharge, orophaynx clear, dentition normal, ears w/o discharge/lesions/ulcers Neck: Supple, trachea midline Cardiovascular: RRR, +S1, S2, no m/g/r, equal pulses throughout Respiratory: CTABL, no w/r/r, normal WOB GI: BS+, NDNT, no masses noted, no organomegaly noted MSK: No e/c/c Neuro: A&O x 3, no focal deficits Psyc: Appropriate interaction and affect, calm/cooperative  Labs on Admission: I have personally reviewed following labs and imaging studies  CBC: Recent Labs  Lab 06/04/21 1231  WBC 13.3*  NEUTROABS 12.0*  HGB 8.5*  HCT 25.5*  MCV 97.7  PLT 409*   Basic Metabolic Panel: Recent Labs  Lab 06/04/21 1231  NA 134*  K 4.5  CL 102  CO2 22  GLUCOSE 255*  BUN 42*  CREATININE 1.04  CALCIUM 8.9   GFR: Estimated Creatinine Clearance: 64.6 mL/min (by C-G formula based on SCr of 1.04 mg/dL). Liver Function Tests: Recent Labs  Lab 06/04/21 1231  AST 17  ALT 20  ALKPHOS 84  BILITOT 0.7  PROT 6.3*  ALBUMIN 3.5   Recent Labs  Lab 06/04/21 1231  LIPASE 49   No results for input(s): AMMONIA in the last 168 hours. Coagulation Profile: Recent Labs  Lab 06/04/21 1231  INR 1.0   Cardiac Enzymes: No results for input(s): CKTOTAL, CKMB, CKMBINDEX, TROPONINI in the last 168 hours. BNP (last 3 results) No results for input(s): PROBNP in the last 8760 hours. HbA1C: No results for input(s): HGBA1C in the last 72 hours. CBG: No results for input(s): GLUCAP in the last 168  hours. Lipid Profile: No results for input(s): CHOL, HDL, LDLCALC, TRIG, CHOLHDL, LDLDIRECT in the last 72 hours. Thyroid Function Tests: No results for input(s): TSH, T4TOTAL, FREET4, T3FREE, THYROIDAB in the last 72 hours. Anemia Panel: No results for  input(s): VITAMINB12, FOLATE, FERRITIN, TIBC, IRON, RETICCTPCT in the last 72 hours. Urine analysis:    Component Value Date/Time   COLORURINE YELLOW 10/21/2019 0517   APPEARANCEUR CLEAR 10/21/2019 0517   LABSPEC 1.017 10/21/2019 0517   PHURINE 5.0 10/21/2019 0517   GLUCOSEU 50 (A) 10/21/2019 0517   GLUCOSEU NEGATIVE 09/12/2006 0747   HGBUR MODERATE (A) 10/21/2019 0517   BILIRUBINUR Positive 10/22/2019 1259   KETONESUR NEGATIVE 10/21/2019 0517   PROTEINUR Positive (A) 10/22/2019 1259   PROTEINUR NEGATIVE 10/21/2019 0517   UROBILINOGEN 0.2 10/22/2019 1259   UROBILINOGEN 0.2 mg/dL 09/12/2006 0747   NITRITE Negative 10/22/2019 1259   NITRITE NEGATIVE 10/21/2019 0517   LEUKOCYTESUR Negative 10/22/2019 1259   LEUKOCYTESUR NEGATIVE 10/21/2019 0517    Radiological Exams on Admission: No results found.  EKG: Independently reviewed. Sinus, no st elevation  Assessment/Plan GIB Symptomatic anemia     - admit to inpt, tele     - [facility] onboard, appreciate assistance, EGD tomorrow     - protonix, fluids     - transfuse 1 unit pRBCs for symptomatic anemia; future transfusions as necessary     - trend q8h H&H  HTN     - continue home regimen  DM2     - SSI, glucose checks, CLD; NPOpMN     - A1c 1 month ago was 7.3%  HLD     - continue statin  CAD s/p PCI in 2015     - on plavix, statin; hold plavix d/t bleed  DVT prophylaxis: SCDs  Code Status: FULL  Family Communication: w/ family at bedside  Consults called: EDP spoke with [facility]   Status is: Inpatient  Remains inpatient appropriate because: severity of illness  Jonnie Finner DO [facility]  If 7PM-7AM, please contact  night-coverage [email or web address]  06/04/2021, 2:30 PM

## 2021-06-04 NOTE — Telephone Encounter (Signed)
Patient is calling in stating that he has been having extreme weakness and dark stools. Samaj has an appointment today but with his symptoms he is worried about being able to come in safely.

## 2021-06-04 NOTE — ED Notes (Signed)
Blood bank has 1 unit of blood ready. Informed Jennifer,RN.

## 2021-06-04 NOTE — ED Provider Notes (Signed)
Emergency Medicine Provider Triage Evaluation Note  [name] [name] , [name] 70 y.o. male  was evaluated in triage.  Pt complains of weakness, dark stool x 1 week. No pain however with reflux, taking otc meds without relief. No Plavix. Some dizziness  Review of Systems  Positive: Weakness, dark stool Negative:   Physical Exam  There were no vitals taken for this visit. Gen:   Awake, no distress , appears pale Cardiac: Tachy Resp:  Normal effort  MSK:   Moves extremities without difficulty  Other:    Medical Decision Making  Medically screening exam initiated at 10:16 AM.  Appropriate orders placed.  [name] [name] was informed that the remainder of the evaluation will be completed by another provider, this initial triage assessment does not replace that evaluation, and the importance of remaining in the ED until their evaluation is complete.  Weakness, dark stool  Needs room in back   [name] Bankson A, PA-C 06/04/21 1021    [name] Shanks, MD 06/04/21 1037

## 2021-06-04 NOTE — ED Provider Notes (Signed)
[name] DEPT Provider Note   CSN: [id number] Arrival date & time: 06/04/21  1004     History Chief Complaint  Patient presents with   Weakness   Rectal Bleeding    [name] [name] is a 70 y.o. male.  He is coming in here with a week to 10 days of generalized weakness lightheadedness when standing decreased appetite.  He has noticed a couple of charcoal colored bowel movements.  He had knee surgery a few weeks ago.  Was then once for syncope low blood pressure felt to be vasovagal.  Has been ambulating and doing well until this last 10 days when he has been more shaky on his feet and lightheaded.  The history is provided by the patient.  Weakness Severity:  Moderate Onset quality:  Gradual Duration:  10 days Timing:  Constant Progression:  Worsening Chronicity:  New Relieved by:  Nothing Worsened by:  Activity Ineffective treatments:  Rest Associated symptoms: abdominal pain, difficulty walking, dizziness and hematochezia   Associated symptoms: no chest pain, no cough, no dysuria, no fever and no nausea   Rectal Bleeding Associated symptoms: abdominal pain, dizziness and light-headedness   Associated symptoms: no fever       Past Medical History:  Diagnosis Date   Adenomatous colon polyp 09/07/2004   Amblyopia    CAD (coronary artery disease)    cardiac CT with a 50% LAD lesion in 2007   Diverticulosis    DM2 (diabetes mellitus, type 2) (HCC)    Elevated LFTs    secondary to labetalol   Esophageal stricture 2003   External hemorrhoids 2012   lanced by Dr Rise Patience   Family history of anesthesia complication    "sister had PONV"   GERD (gastroesophageal reflux disease)    esophageal stricture, pmh   Hiatal hernia 01/04/2000   History of kidney stones 2022   HLD (hyperlipidemia)    HTN (hypertension)    Hypopotassemia     Patient Active Problem List   Diagnosis Date Noted   S/P total knee arthroplasty, 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    Nephrolithiasis 03/05/2020   Labral tear of long head of biceps tendon, left, sequela    Complete tear of left rotator cuff    Diverticulosis of colon (without mention of hemorrhage) 04/25/2011   Adenomatous colon polyp 03/17/2011   LOW BACK PAIN, CHRONIC 12/13/2007   Hyperlipidemia associated with type 2 diabetes mellitus ([place]) 06/08/2007   T2DM (type 2 diabetes mellitus) ([place]) 02/27/2007   Hypertension associated with diabetes ([place]) 02/27/2007   AMBLYOPIA 12/06/2006   GERD 12/06/2006    Past Surgical History:  Procedure Laterality Date   COLONOSCOPY  06/27/2004   tics , ADENOMATOUS polyps. F/U due 2011   COLONOSCOPY  06/27/2010   Tics   CORONARY ANGIOPLASTY WITH STENT PLACEMENT  04/01/2014   "2"   ESOPHAGEAL DILATION  06/27/2001   ESOPHAGOGASTRODUODENOSCOPY  06/27/2001   ERD, stricture dilated   EXCISIONAL HEMORRHOIDECTOMY  ~ 2008   LEFT HEART CATHETERIZATION WITH CORONARY ANGIOGRAM N/A 03/26/2014   Procedure: LEFT HEART CATHETERIZATION WITH CORONARY ANGIOGRAM;  Surgeon: Peter M Martinique, MD;  Location: [facility] CATH LAB;  Service: Cardiovascular;  Laterality: N/A;   PERCUTANEOUS CORONARY STENT INTERVENTION (PCI-S) N/A 04/01/2014   Procedure: PERCUTANEOUS CORONARY STENT INTERVENTION (PCI-S);  Surgeon: Peter M Martinique, MD;  Location: [facility] CATH LAB;  Service: Cardiovascular;  Laterality: N/A;   POSTERIOR LAMINECTOMY / DECOMPRESSION LUMBAR SPINE  12/25/2009   L4 , Dr Sherwood Gambler   SHOULDER ARTHROSCOPY  WITH ROTATOR CUFF REPAIR AND OPEN BICEPS TENODESIS Left 02/04/2019   Procedure: LEFT SHOULDER ARTHROSCOPY, BICEPS TENODESIS, MINI OPEN ROTATOR CUFF TEAR REPAIR;  Surgeon: Meredith Pel, MD;  Location: [facility];  Service: Orthopedics;  Laterality: Left;   TOTAL KNEE ARTHROPLASTY Right 05/11/2021   Procedure: TOTAL KNEE ARTHROPLASTY;  Surgeon: Paralee Cancel, MD;  Location: [facility];  Service: Orthopedics;  Laterality: Right;       Family History  Problem Relation Age of Onset    Heart attack Mother 35   Stroke Mother 59   Prostate cancer Father    Diabetes Father    Heart disease Father 75       CBAG    Diabetes Brother    Heart attack Brother    Breast cancer Maternal Aunt    Heart attack Maternal Grandfather 61   Diabetes Paternal Grandmother    Stroke Paternal Grandfather 34   Heart disease Sister     Social History   Tobacco Use   Smoking status: Never   Smokeless tobacco: Never  Vaping Use   Vaping Use: Never used  Substance Use Topics   Alcohol use: Yes    Alcohol/week: 2.0 standard drinks    Types: 2 Cans of beer per week    Comment: social   Drug use: No    Home Medications Prior to Admission medications   Medication Sig Start Date End Date Taking? Authorizing Provider  atorvastatin (LIPITOR) 80 MG tablet TAKE 1 TABLET DAILY AT     6:00PM 03/10/21   Vivi Barrack, MD  carvedilol (COREG) 3.125 MG tablet TAKE 1 TABLET TWICE DAILY  WITH MEALS. 03/10/21   Vivi Barrack, MD  clopidogrel (PLAVIX) 75 MG tablet TAKE 1 TABLET DAILY 03/10/21   Vivi Barrack, MD  Coenzyme Q10 200 MG capsule Take 200 mg by mouth daily.    [provider]  docusate sodium (COLACE) 100 MG capsule Take 1 capsule (100 mg total) by mouth 2 (two) times daily. 05/12/21   [name], PA-C  famotidine-calcium carbonate-magnesium hydroxide (PEPCID COMPLETE) 10-800-165 MG chewable tablet Chew 1 tablet by mouth daily as needed (heartburn).    [provider]  glucose blood (ONE TOUCH ULTRA TEST) test strip Use one strip per test. Test blood sugars 1-4 times daily as instructed. 01/19/17   Golden Circle, FNP  Homeopathic Products [facility] ALLERGY RELIEF NA) Place 1 spray into the nose daily as needed (allergies).    [provider]  HYDROcodone-acetaminophen (NORCO) 7.5-325 MG tablet Take 1-2 tablets by mouth every 4 (four) hours as needed for severe pain (pain score 7-10). 05/12/21   [name], PA-C  irbesartan (AVAPRO) 300 MG tablet  TAKE 1 TABLET DAILY. 03/10/21   Vivi Barrack, MD  metFORMIN (GLUCOPHAGE-XR) 500 MG 24 hr tablet Take 2 tablet (1000Mg  total) by mouth daily after supper 07/23/20   Vivi Barrack, MD  methocarbamol (ROBAXIN) 500 MG tablet Take 1 tablet (500 mg total) by mouth every 6 (six) hours as needed for muscle spasms. 05/12/21   [name], PA-C  Misc Natural Products (GLUCOSAMINE CHOND COMPLEX/MSM) TABS Take 1 tablet by mouth daily. 11/01/18   [provider]  Multiple Vitamins-Minerals (MULTIVITAMIN WITH MINERALS) tablet Take 1 tablet by mouth daily.    [provider]  nitroGLYCERIN (NITROSTAT) 0.4 MG SL tablet Place 1 tablet (0.4 mg total) under the tongue every 5 (five) minutes as needed for chest pain. 08/24/17  Martinique, Peter M, MD  polyethylene glycol (MIRALAX / GLYCOLAX) 17 g packet Take 17 g by mouth daily as needed for mild constipation. 05/12/21   [name], PA-C  PROTEIN PO Take 1 Container by mouth 2 (two) times a week.    [provider]  sildenafil (REVATIO) 20 MG tablet Take 1-5 tablets by mouth daily as needed. 07/23/19   Vivi Barrack, MD  spironolactone (ALDACTONE) 25 MG tablet TAKE 1 TABLET DAILY 03/10/21   Vivi Barrack, MD  Undecylenic Ac-Zn Undecylenate (FUNGI-NAIL TOE & FOOT EX) Apply 1 application topically every other day.    [provider]  vitamin B-12 (CYANOCOBALAMIN) 1000 MCG tablet Take 1,000 mcg by mouth daily.    [provider]    Allergies    Aspirin, Other, Oxycodone, Contrast media [iodinated diagnostic agents], and Norvasc [amlodipine besylate]  Review of Systems   Review of Systems  Constitutional:  Positive for appetite change. Negative for fever.  HENT:  Negative for sore throat.   Eyes:  Negative for visual disturbance.  Respiratory:  Negative for cough.   Cardiovascular:  Negative for chest pain.  Gastrointestinal:  Positive for abdominal pain and hematochezia. Negative for nausea.  Genitourinary:   Negative for dysuria.  Musculoskeletal:  Positive for gait problem.  Skin:  Positive for wound.  Neurological:  Positive for dizziness, weakness and light-headedness.   Physical Exam Updated Vital Signs BP 134/85 (BP Location: Right Arm)   Pulse 93   Temp (!) 97.5 F (36.4 C) (Oral)   Resp 19   SpO2 100%   Physical Exam Vitals and nursing note reviewed.  Constitutional:      General: He is not in acute distress.    Appearance: Normal appearance. He is well-developed.  HENT:     Head: Normocephalic and atraumatic.  Eyes:     Conjunctiva/sclera: Conjunctivae normal.  Cardiovascular:     Rate and Rhythm: Normal rate and regular rhythm.     Heart sounds: No murmur heard. Pulmonary:     Effort: Pulmonary effort is normal. No respiratory distress.     Breath sounds: Normal breath sounds.  Abdominal:     Palpations: Abdomen is soft.     Tenderness: There is no abdominal tenderness. There is no guarding or rebound.  Musculoskeletal:        General: Tenderness present. No swelling.     Cervical back: Neck supple.     Comments: Roger Kill has a surgical incision that is healing well of his right knee.  There is a moderate effusion.  No overlying erythema.  Mildly tender  Skin:    General: Skin is warm and dry.     Capillary Refill: Capillary refill takes less than 2 seconds.  Neurological:     General: No focal deficit present.     Mental Status: He is alert.  Psychiatric:        Mood and Affect: Mood normal.    ED Results / Procedures / Treatments   Labs (all labs ordered are listed, but only abnormal results are displayed) Labs Reviewed  CBC WITH DIFFERENTIAL/PLATELET - Abnormal; Notable for the following components:      Result Value   WBC 13.3 (*)    RBC 2.61 (*)    Hemoglobin 8.5 (*)    HCT 25.5 (*)    Platelets 409 (*)    Neutro Abs 12.0 (*)    Lymphs Abs 0.6 (*)    All other components within normal limits  COMPREHENSIVE METABOLIC PANEL - Abnormal; Notable for the  following components:   Sodium 134 (*)    Glucose, Bld 255 (*)    BUN 42 (*)    Total Protein 6.3 (*)    All other components within normal limits  HEMOGLOBIN - Abnormal; Notable for the following components:   Hemoglobin 7.7 (*)    All other components within normal limits  POC OCCULT BLOOD, ED - Abnormal; Notable for the following components:   Fecal Occult Bld POSITIVE (*)    All other components within normal limits  RESP PANEL BY RT-PCR (FLU A&B, COVID) ARPGX2  LIPASE, BLOOD  PROTIME-INR  URINALYSIS, ROUTINE W REFLEX MICROSCOPIC  HEMOGLOBIN  HEMOGLOBIN  TYPE AND SCREEN  PREPARE RBC (CROSSMATCH)    EKG EKG Interpretation  Date/Time:  Friday June 04 2021 12:11:47 EST Ventricular Rate:  97 PR Interval:  135 QRS Duration: 88 QT Interval:  352 QTC Calculation: 448 R Axis:   8 Text Interpretation: Sinus rhythm no acute ST/Ts No significant change since prior 11/22 Confirmed by Aletta Edouard [phone]) on 06/04/2021 12:46:33 PM  Radiology No results found.  Procedures .Critical Care Performed by: Hayden Rasmussen, MD Authorized by: Hayden Rasmussen, MD   Critical care provider statement:    Critical care time (minutes):  45   Critical care time was exclusive of:  Separately billable procedures and treating other patients   Critical care was necessary to treat or prevent imminent or life-threatening deterioration of the following conditions:  Circulatory failure   Critical care was time spent personally by me on the following activities:  Development of treatment plan with patient or surrogate, discussions with consultants, evaluation of patient's response to treatment, examination of patient, obtaining history from patient or surrogate, ordering and performing treatments and interventions, ordering and review of laboratory studies, ordering and review of radiographic studies, pulse oximetry, re-evaluation of patient's condition and review of old charts   I assumed  direction of critical care for this patient from another provider in my specialty: no     Medications Ordered in ED Medications  pantoprazole (PROTONIX) injection 40 mg (has no administration in time range)  pantoprazole (PROTONIX) injection 40 mg (40 mg Intravenous Given 06/04/21 1438)  0.9 %  sodium chloride infusion (Manually program via Guardrails IV Fluids) ( Intravenous New Bag/Given 06/04/21 1759)    ED Course  I have reviewed the triage vital signs and the nursing notes.  Pertinent labs & imaging results that were available during my care of the patient were reviewed by me and considered in my medical decision making (see chart for details).  Clinical Course as of 06/04/21 1921  Ludwig Clarks Jun 04, 2021  1221 Rectal exam done with nurse Anderson Malta as chaperone.  Normal tone no masses.  Sample sent to lab for guaiac. [MB]  1310 He said he has not had a colonoscopy in about 12 years because the guidelines have changed.  He saw [facility] in the past. [MB]  1331 Discussed with [place] [facility] gastroenterology who will see in consult. [MB]  1430 Discussed with [facility] hospitalist Dr. Marylyn Ishihara who will evaluate for admission. [MB]    Clinical Course User Index [MB] Hayden Rasmussen, MD   MDM Rules/Calculators/A&P                          This patient complains of lightheadedness tachycardia generalized weakness dark stools; this involves an extensive number of treatment Options and  is a complaint that carries with it a high risk of complications and Morbidity. The differential includes GI bleed, anemia, dehydration, arrhythmia, metabolic derangement  I ordered, reviewed and interpreted labs, which included CBC with elevated white count, hemoglobin low down 5 points from prior, chemistries with elevated glucose elevated BUN possibly reflecting some GI bleed, fecal occult positive, COVID and flu negative I ordered medication IV PPI  Previous records obtained and reviewed in epic including prior  op note and recent ED visit for syncope I consulted [facility] gastroenterology and Dr. Marylyn Ishihara [facility] hospitalist and discussed lab and imaging findings  Critical Interventions: Work-up and management of patient's lightheadedness found to be likely GI bleed.  Discussion of transfusion and need for admission with patient.  After the interventions stated above, I reevaluated the patient and found patient to be fairly asymptomatic while lying flat.  He is in agreement with admission for further work-up.   Final Clinical Impression(s) / ED Diagnoses Final diagnoses:  Gastrointestinal hemorrhage, unspecified gastrointestinal hemorrhage type  Symptomatic anemia    Rx / DC Orders ED Discharge Orders     None        Hayden Rasmussen, MD 06/04/21 [phone]

## 2021-06-04 NOTE — Telephone Encounter (Signed)
Spoke with patient advise to go to ED.

## 2021-06-04 NOTE — Consult Note (Addendum)
Consultation  Referring Provider:    Dr. Melina Copa Primary Care Physician:  Vivi Barrack, MD Primary Gastroenterologist: Dr. Sharlett Iles Reason for Consultation:    Anemia and melena         HPI:   [name] [name] is a 70 y.o. male with a past medical history of CAD on Plavix, hypertension, GERD and others, who presented to the ER with a complaint of weakness and dark stool over the past week.    05/25/2021 ER visit for vasovagal syncope.    Today, the patient describes that he had knee surgery 05/11/21 and was given Hydrocodone for pain, due to this he became quite constipated and started using "heavy laxatives".  He then started to have stools again about 3 weeks ago and tells me that they have been black in color every time since then.  He typically was having 1 a day but over the past week or 2 has been having at least 2 a day.  Initially he did did not think much of this because he continued to feel well, but over the past week he has had a decrease in energy and appetite and in general has been feeling ill.  Tells me he has also lost around 12 pounds over this time.  Along with this experiences occasional epigastric pain off and on and heartburn/reflux in his throat.  He has been using his Pepcid Complete 1-2 tabs a day, which he typically only took as needed before.  He believes his last dose of Plavix was on Wednesday morning, 06/02/2021.    Denies fever, chills, nausea or vomiting.  Also denies additional NSAID use.  ER course: Hemoglobin 8.5 (13.3 on 05/25/2021), CMP pending, INR 1  GI history: 04/25/2011 colonoscopy with mild diverticulosis in the sigmoid colon and no polyps or cancers, repeat recommended in 5 years  2006 had diminutive adenoma at colonoscopy and remote EGD w/ gastritis and esophagitis ([facility]) Past Medical History:  Diagnosis Date   Adenomatous colon polyp 09/07/2004   Amblyopia    CAD (coronary artery disease)    cardiac CT with a 50% LAD lesion in 2007    Diverticulosis    DM2 (diabetes mellitus, type 2) (HCC)    Elevated LFTs    secondary to labetalol   Esophageal stricture 2003   External hemorrhoids 2012   lanced by Dr Rise Patience   Family history of anesthesia complication    "sister had PONV"   GERD (gastroesophageal reflux disease)    esophageal stricture, pmh   Hiatal hernia 01/04/2000   History of kidney stones 2022   HLD (hyperlipidemia)    HTN (hypertension)    Hypopotassemia     Past Surgical History:  Procedure Laterality Date   COLONOSCOPY  06/27/2004   tics , ADENOMATOUS polyps. F/U due 2011   COLONOSCOPY  06/27/2010   Tics   CORONARY ANGIOPLASTY WITH STENT PLACEMENT  04/01/2014   "2"   ESOPHAGEAL DILATION  06/27/2001   ESOPHAGOGASTRODUODENOSCOPY  06/27/2001   ERD, stricture dilated   EXCISIONAL HEMORRHOIDECTOMY  ~ 2008   LEFT HEART CATHETERIZATION WITH CORONARY ANGIOGRAM N/A 03/26/2014   Procedure: LEFT HEART CATHETERIZATION WITH CORONARY ANGIOGRAM;  Surgeon: Peter M Martinique, MD;  Location: [facility] CATH LAB;  Service: Cardiovascular;  Laterality: N/A;   PERCUTANEOUS CORONARY STENT INTERVENTION (PCI-S) N/A 04/01/2014   Procedure: PERCUTANEOUS CORONARY STENT INTERVENTION (PCI-S);  Surgeon: Peter M Martinique, MD;  Location: [facility] CATH LAB;  Service: Cardiovascular;  Laterality: N/A;   POSTERIOR  LAMINECTOMY / DECOMPRESSION LUMBAR SPINE  12/25/2009   L4 , Dr Sherwood Gambler   SHOULDER ARTHROSCOPY WITH ROTATOR CUFF REPAIR AND OPEN BICEPS TENODESIS Left 02/04/2019   Procedure: LEFT SHOULDER ARTHROSCOPY, BICEPS TENODESIS, MINI OPEN ROTATOR CUFF TEAR REPAIR;  Surgeon: Meredith Pel, MD;  Location: [facility];  Service: Orthopedics;  Laterality: Left;   TOTAL KNEE ARTHROPLASTY Right 05/11/2021   Procedure: TOTAL KNEE ARTHROPLASTY;  Surgeon: Paralee Cancel, MD;  Location: [facility];  Service: Orthopedics;  Laterality: Right;    Family History  Problem Relation Age of Onset   Heart attack Mother 40   Stroke Mother 60    Prostate cancer Father    Diabetes Father    Heart disease Father 57       CBAG    Diabetes Brother    Heart attack Brother    Breast cancer Maternal Aunt    Heart attack Maternal Grandfather 61   Diabetes Paternal Grandmother    Stroke Paternal Grandfather 37   Heart disease Sister     Social History   Tobacco Use   Smoking status: Never   Smokeless tobacco: Never  Vaping Use   Vaping Use: Never used  Substance Use Topics   Alcohol use: Yes    Alcohol/week: 2.0 standard drinks    Types: 2 Cans of beer per week    Comment: social   Drug use: No    Prior to Admission medications   Medication Sig Start Date End Date Taking? Authorizing Provider  atorvastatin (LIPITOR) 80 MG tablet TAKE 1 TABLET DAILY AT     6:00PM 03/10/21   Vivi Barrack, MD  carvedilol (COREG) 3.125 MG tablet TAKE 1 TABLET TWICE DAILY  WITH MEALS. 03/10/21   Vivi Barrack, MD  clopidogrel (PLAVIX) 75 MG tablet TAKE 1 TABLET DAILY 03/10/21   Vivi Barrack, MD  Coenzyme Q10 200 MG capsule Take 200 mg by mouth daily.    [provider]  docusate sodium (COLACE) 100 MG capsule Take 1 capsule (100 mg total) by mouth 2 (two) times daily. 05/12/21   [name], PA-C  famotidine-calcium carbonate-magnesium hydroxide (PEPCID COMPLETE) 10-800-165 MG chewable tablet Chew 1 tablet by mouth daily as needed (heartburn).    [provider]  glucose blood (ONE TOUCH ULTRA TEST) test strip Use one strip per test. Test blood sugars 1-4 times daily as instructed. 01/19/17   Golden Circle, FNP  Homeopathic Products [facility] ALLERGY RELIEF NA) Place 1 spray into the nose daily as needed (allergies).    [provider]  HYDROcodone-acetaminophen (NORCO) 7.5-325 MG tablet Take 1-2 tablets by mouth every 4 (four) hours as needed for severe pain (pain score 7-10). 05/12/21   [name], PA-C  irbesartan (AVAPRO) 300 MG tablet TAKE 1 TABLET DAILY. 03/10/21   Vivi Barrack, MD  metFORMIN  (GLUCOPHAGE-XR) 500 MG 24 hr tablet Take 2 tablet (1000Mg  total) by mouth daily after supper 07/23/20   Vivi Barrack, MD  methocarbamol (ROBAXIN) 500 MG tablet Take 1 tablet (500 mg total) by mouth every 6 (six) hours as needed for muscle spasms. 05/12/21   [name], PA-C  Misc Natural Products (GLUCOSAMINE CHOND COMPLEX/MSM) TABS Take 1 tablet by mouth daily. 11/01/18   [provider]  Multiple Vitamins-Minerals (MULTIVITAMIN WITH MINERALS) tablet Take 1 tablet by mouth daily.    [provider]  nitroGLYCERIN (NITROSTAT) 0.4 MG SL tablet Place 1 tablet (0.4 mg total) under the tongue  every 5 (five) minutes as needed for chest pain. 08/24/17   Martinique, Peter M, MD  polyethylene glycol (MIRALAX / GLYCOLAX) 17 g packet Take 17 g by mouth daily as needed for mild constipation. 05/12/21   [name], PA-C  PROTEIN PO Take 1 Container by mouth 2 (two) times a week.    [provider]  sildenafil (REVATIO) 20 MG tablet Take 1-5 tablets by mouth daily as needed. 07/23/19   Vivi Barrack, MD  spironolactone (ALDACTONE) 25 MG tablet TAKE 1 TABLET DAILY 03/10/21   Vivi Barrack, MD  Undecylenic Ac-Zn Undecylenate (FUNGI-NAIL TOE & FOOT EX) Apply 1 application topically every other day.    [provider]  vitamin B-12 (CYANOCOBALAMIN) 1000 MCG tablet Take 1,000 mcg by mouth daily.    [provider]    Current Facility-Administered Medications  Medication Dose Route Frequency Provider Last Rate Last Admin   pantoprazole (PROTONIX) injection 40 mg  40 mg Intravenous Once Hayden Rasmussen, MD       Current Outpatient Medications  Medication Sig Dispense Refill   atorvastatin (LIPITOR) 80 MG tablet TAKE 1 TABLET DAILY AT     6:00PM 90 tablet 0   carvedilol (COREG) 3.125 MG tablet TAKE 1 TABLET TWICE DAILY  WITH MEALS. 180 tablet 0   clopidogrel (PLAVIX) 75 MG tablet TAKE 1 TABLET DAILY 90 tablet 0   Coenzyme Q10 200 MG capsule Take 200 mg by  mouth daily.     docusate sodium (COLACE) 100 MG capsule Take 1 capsule (100 mg total) by mouth 2 (two) times daily. 10 capsule 0   famotidine-calcium carbonate-magnesium hydroxide (PEPCID COMPLETE) 10-800-165 MG chewable tablet Chew 1 tablet by mouth daily as needed (heartburn).     glucose blood (ONE TOUCH ULTRA TEST) test strip Use one strip per test. Test blood sugars 1-4 times daily as instructed. 25 each 5   Homeopathic Products (ZICAM ALLERGY RELIEF NA) Place 1 spray into the nose daily as needed (allergies).     HYDROcodone-acetaminophen (NORCO) 7.5-325 MG tablet Take 1-2 tablets by mouth every 4 (four) hours as needed for severe pain (pain score 7-10). 56 tablet 0   irbesartan (AVAPRO) 300 MG tablet TAKE 1 TABLET DAILY. 90 tablet 0   metFORMIN (GLUCOPHAGE-XR) 500 MG 24 hr tablet Take 2 tablet (1000Mg  total) by mouth daily after supper 180 tablet 3   methocarbamol (ROBAXIN) 500 MG tablet Take 1 tablet (500 mg total) by mouth every 6 (six) hours as needed for muscle spasms. 40 tablet 0   Misc Natural Products (GLUCOSAMINE CHOND COMPLEX/MSM) TABS Take 1 tablet by mouth daily.     Multiple Vitamins-Minerals (MULTIVITAMIN WITH MINERALS) tablet Take 1 tablet by mouth daily.     nitroGLYCERIN (NITROSTAT) 0.4 MG SL tablet Place 1 tablet (0.4 mg total) under the tongue every 5 (five) minutes as needed for chest pain. 100 tablet 1   polyethylene glycol (MIRALAX / GLYCOLAX) 17 g packet Take 17 g by mouth daily as needed for mild constipation. 14 each 0   PROTEIN PO Take 1 Container by mouth 2 (two) times a week.     sildenafil (REVATIO) 20 MG tablet Take 1-5 tablets by mouth daily as needed. 100 tablet 0   spironolactone (ALDACTONE) 25 MG tablet TAKE 1 TABLET DAILY 90 tablet 0   Undecylenic Ac-Zn Undecylenate (FUNGI-NAIL TOE & FOOT EX) Apply 1 application topically every other day.     vitamin B-12 (CYANOCOBALAMIN) 1000 MCG tablet Take 1,000 mcg  by mouth daily.      Allergies as of 06/04/2021 -  Review Complete 06/04/2021  Allergen Reaction Noted   Aspirin Hives 12/06/2006   Other Hives 05/17/2019   Oxycodone Nausea Only 02/04/2019   Contrast media [iodinated diagnostic agents] Itching and Rash 03/31/2014   Norvasc [amlodipine besylate] Cough 09/23/2013     Review of Systems:    Constitutional: No weight loss, fever or chills Skin: No rash  Cardiovascular: No chest pain Respiratory: No SOB Gastrointestinal: See HPI and otherwise negative Genitourinary: No dysuria Neurological: +dizziness Musculoskeletal: No new muscle or joint pain Hematologic: No bruising Psychiatric: No history of depression or anxiety    Physical Exam:  Vital signs in last 24 hours: Temp:  [97.5 F (36.4 C)-98 F (36.7 C)] 97.5 F (36.4 C) (12/09 1212) Pulse Rate:  [92-104] 101 (12/09 1345) Resp:  [15-25] 16 (12/09 1345) BP: (112-136)/(76-89) 136/79 (12/09 1330) SpO2:  [98 %-100 %] 100 % (12/09 1345)   General:   Pleasant Caucasian male appears to be in NAD, Well developed, Well nourished, alert and cooperative Head:  Normocephalic and atraumatic. Eyes:   PEERL, EOMI. No icterus. Conjunctiva pink. Ears:  Normal auditory acuity. Neck:  Supple Throat: Oral cavity and pharynx without inflammation, swelling or lesion.  Lungs: Respirations even and unlabored. Lungs clear to auscultation bilaterally.   No wheezes, crackles, or rhonchi.  Heart: Normal S1, S2. No MRG. Regular rate and rhythm. No peripheral edema, cyanosis or pallor.  Abdomen:  Soft, nondistended, nontender. No rebound or guarding. Normal bowel sounds. No appreciable masses or hepatomegaly. Rectal:  Not performed.  Msk:  Symmetrical without gross deformities. Peripheral pulses intact. +healing surgical incision right knee, moderate effusion, mild ttp Extremities:  Without edema, no deformity or joint abnormality.  Neurologic:  Alert and  oriented x4;  grossly normal neurologically. Skin:   Dry and intact without significant lesions or  rashes. Psychiatric: Demonstrates good judgement and reason without abnormal affect or behaviors.   LAB RESULTS: Recent Labs    06/04/21 1231  WBC 13.3*  HGB 8.5*  HCT 25.5*  PLT 409*   PT/INR Recent Labs    06/04/21 1231  LABPROT 13.4  INR 1.0   CMP     Component Value Date/Time   NA 134 (L) 06/04/2021 1231   NA 143 08/18/2016 0814   K 4.5 06/04/2021 1231   CL 102 06/04/2021 1231   CO2 22 06/04/2021 1231   GLUCOSE 255 (H) 06/04/2021 1231   BUN 42 (H) 06/04/2021 1231   BUN 13 08/18/2016 0814   CREATININE 1.04 06/04/2021 1231   CREATININE 0.96 03/05/2020 1009   CALCIUM 8.9 06/04/2021 1231   PROT 6.3 (L) 06/04/2021 1231   PROT 6.7 08/18/2016 0814   ALBUMIN 3.5 06/04/2021 1231   ALBUMIN 4.3 08/18/2016 0814   AST 17 06/04/2021 1231   ALT 20 06/04/2021 1231   ALKPHOS 84 06/04/2021 1231   BILITOT 0.7 06/04/2021 1231   BILITOT 0.6 08/18/2016 0814   GFRNONAA >60 06/04/2021 1231   GFRAA >60 10/21/2019 0111      Impression / Plan:   Impression: 1.  Acute Anemia with Description of Melenic stools: Hemoglobin 8.5 (13.3 on 05/25/2021), BUN elevated at 42, patient describes melenic stools for the past 3 weeks, increasing in frequency over the past week with increasing fatigue and dizziness, does take Plavix; likely upper GI bleed 2.  Right Knee replacement: 05/11/2021 3.  Chronic anticoagulation: On Plavix   Plan: 1.  Last dose of Plavix  06/02/2021 AM per the patient.  Continue to hold now. 2.  Patient will need an EGD for further evaluation.  Scheduled for tomorrow with Dr. Carlean Purl.  Did discuss risks, benefits, limitations and alternatives the patient agrees to proceed. 3.  Continue to monitor hemoglobin with transfusion as needed less than 7 4.  Agree with Pantoprazole 40 mg IV twice daily 5.  Clear liquid diet today and npo at midnight.  Thank you for your kind consultation, we will continue to follow.  [name] [name]  06/04/2021, 1:49 PM    [facility]  Attending   I have taken an interval history, reviewed the chart and examined the patient. I agree with the Advanced Practitioner's note, impression and recommendations.  Majority the medical decision-making in the formulation of the assessment and plan were performed by me.  Melena + elevated BUN/creatinine ratio so we suspect UGI bleed.  Plavix not washed out but appropriate to evaluate w/ EGd and possibly treat bleeding lesions.  The risks and benefits as well as alternatives of endoscopic procedure(s) have been discussed and reviewed. All questions answered. The patient agrees to proceed.  Seen w/ Dr. Marylyn Ishihara of [facility] and agree w/ his plan to transfuse 1 u RBC given presyncope and current Hgb.  Gatha Mayer, MD, [facility] 06/04/2021 3:14 PM

## 2021-06-04 NOTE — H&P (View-Only) (Signed)
Consultation  Referring Provider:    Dr. Melina Copa Primary Care Physician:  Vivi Barrack, MD Primary Gastroenterologist: Dr. Sharlett Iles Reason for Consultation:    Anemia and melena         HPI:   [name] [name] is a 70 y.o. male with a past medical history of CAD on Plavix, hypertension, GERD and others, who presented to the ER with a complaint of weakness and dark stool over the past week.    05/25/2021 ER visit for vasovagal syncope.    Today, the patient describes that he had knee surgery 05/11/21 and was given Hydrocodone for pain, due to this he became quite constipated and started using "heavy laxatives".  He then started to have stools again about 3 weeks ago and tells me that they have been black in color every time since then.  He typically was having 1 a day but over the past week or 2 has been having at least 2 a day.  Initially he did did not think much of this because he continued to feel well, but over the past week he has had a decrease in energy and appetite and in general has been feeling ill.  Tells me he has also lost around 12 pounds over this time.  Along with this experiences occasional epigastric pain off and on and heartburn/reflux in his throat.  He has been using his Pepcid Complete 1-2 tabs a day, which he typically only took as needed before.  He believes his last dose of Plavix was on Wednesday morning, 06/02/2021.    Denies fever, chills, nausea or vomiting.  Also denies additional NSAID use.  ER course: Hemoglobin 8.5 (13.3 on 05/25/2021), CMP pending, INR 1  GI history: 04/25/2011 colonoscopy with mild diverticulosis in the sigmoid colon and no polyps or cancers, repeat recommended in 5 years  2006 had diminutive adenoma at colonoscopy and remote EGD w/ gastritis and esophagitis ([facility]) Past Medical History:  Diagnosis Date   Adenomatous colon polyp 09/07/2004   Amblyopia    CAD (coronary artery disease)    cardiac CT with a 50% LAD lesion in 2007    Diverticulosis    DM2 (diabetes mellitus, type 2) (HCC)    Elevated LFTs    secondary to labetalol   Esophageal stricture 2003   External hemorrhoids 2012   lanced by Dr Rise Patience   Family history of anesthesia complication    "sister had PONV"   GERD (gastroesophageal reflux disease)    esophageal stricture, pmh   Hiatal hernia 01/04/2000   History of kidney stones 2022   HLD (hyperlipidemia)    HTN (hypertension)    Hypopotassemia     Past Surgical History:  Procedure Laterality Date   COLONOSCOPY  06/27/2004   tics , ADENOMATOUS polyps. F/U due 2011   COLONOSCOPY  06/27/2010   Tics   CORONARY ANGIOPLASTY WITH STENT PLACEMENT  04/01/2014   "2"   ESOPHAGEAL DILATION  06/27/2001   ESOPHAGOGASTRODUODENOSCOPY  06/27/2001   ERD, stricture dilated   EXCISIONAL HEMORRHOIDECTOMY  ~ 2008   LEFT HEART CATHETERIZATION WITH CORONARY ANGIOGRAM N/A 03/26/2014   Procedure: LEFT HEART CATHETERIZATION WITH CORONARY ANGIOGRAM;  Surgeon: Peter M Martinique, MD;  Location: [facility] CATH LAB;  Service: Cardiovascular;  Laterality: N/A;   PERCUTANEOUS CORONARY STENT INTERVENTION (PCI-S) N/A 04/01/2014   Procedure: PERCUTANEOUS CORONARY STENT INTERVENTION (PCI-S);  Surgeon: Peter M Martinique, MD;  Location: [facility] CATH LAB;  Service: Cardiovascular;  Laterality: N/A;   POSTERIOR  LAMINECTOMY / DECOMPRESSION LUMBAR SPINE  12/25/2009   L4 , Dr Sherwood Gambler   SHOULDER ARTHROSCOPY WITH ROTATOR CUFF REPAIR AND OPEN BICEPS TENODESIS Left 02/04/2019   Procedure: LEFT SHOULDER ARTHROSCOPY, BICEPS TENODESIS, MINI OPEN ROTATOR CUFF TEAR REPAIR;  Surgeon: Meredith Pel, MD;  Location: [facility];  Service: Orthopedics;  Laterality: Left;   TOTAL KNEE ARTHROPLASTY Right 05/11/2021   Procedure: TOTAL KNEE ARTHROPLASTY;  Surgeon: Paralee Cancel, MD;  Location: [facility];  Service: Orthopedics;  Laterality: Right;    Family History  Problem Relation Age of Onset   Heart attack Mother 62   Stroke Mother 29    Prostate cancer Father    Diabetes Father    Heart disease Father 33       CBAG    Diabetes Brother    Heart attack Brother    Breast cancer Maternal Aunt    Heart attack Maternal Grandfather 61   Diabetes Paternal Grandmother    Stroke Paternal Grandfather 18   Heart disease Sister     Social History   Tobacco Use   Smoking status: Never   Smokeless tobacco: Never  Vaping Use   Vaping Use: Never used  Substance Use Topics   Alcohol use: Yes    Alcohol/week: 2.0 standard drinks    Types: 2 Cans of beer per week    Comment: social   Drug use: No    Prior to Admission medications   Medication Sig Start Date End Date Taking? Authorizing Provider  atorvastatin (LIPITOR) 80 MG tablet TAKE 1 TABLET DAILY AT     6:00PM 03/10/21   Vivi Barrack, MD  carvedilol (COREG) 3.125 MG tablet TAKE 1 TABLET TWICE DAILY  WITH MEALS. 03/10/21   Vivi Barrack, MD  clopidogrel (PLAVIX) 75 MG tablet TAKE 1 TABLET DAILY 03/10/21   Vivi Barrack, MD  Coenzyme Q10 200 MG capsule Take 200 mg by mouth daily.    [provider]  docusate sodium (COLACE) 100 MG capsule Take 1 capsule (100 mg total) by mouth 2 (two) times daily. 05/12/21   [name], PA-C  famotidine-calcium carbonate-magnesium hydroxide (PEPCID COMPLETE) 10-800-165 MG chewable tablet Chew 1 tablet by mouth daily as needed (heartburn).    [provider]  glucose blood (ONE TOUCH ULTRA TEST) test strip Use one strip per test. Test blood sugars 1-4 times daily as instructed. 01/19/17   [place] Circle, FNP  Homeopathic Products [facility] ALLERGY RELIEF NA) Place 1 spray into the nose daily as needed (allergies).    [provider]  HYDROcodone-acetaminophen (NORCO) 7.5-325 MG tablet Take 1-2 tablets by mouth every 4 (four) hours as needed for severe pain (pain score 7-10). 05/12/21   [name], PA-C  irbesartan (AVAPRO) 300 MG tablet TAKE 1 TABLET DAILY. 03/10/21   Vivi Barrack, MD  metFORMIN  (GLUCOPHAGE-XR) 500 MG 24 hr tablet Take 2 tablet (1000Mg  total) by mouth daily after supper 07/23/20   Vivi Barrack, MD  methocarbamol (ROBAXIN) 500 MG tablet Take 1 tablet (500 mg total) by mouth every 6 (six) hours as needed for muscle spasms. 05/12/21   [name], PA-C  Misc Natural Products (GLUCOSAMINE CHOND COMPLEX/MSM) TABS Take 1 tablet by mouth daily. 11/01/18   [provider]  Multiple Vitamins-Minerals (MULTIVITAMIN WITH MINERALS) tablet Take 1 tablet by mouth daily.    [provider]  nitroGLYCERIN (NITROSTAT) 0.4 MG SL tablet Place 1 tablet (0.4 mg total) under the tongue  every 5 (five) minutes as needed for chest pain. 08/24/17   Martinique, Peter M, MD  polyethylene glycol (MIRALAX / GLYCOLAX) 17 g packet Take 17 g by mouth daily as needed for mild constipation. 05/12/21   [name], PA-C  PROTEIN PO Take 1 Container by mouth 2 (two) times a week.    [provider]  sildenafil (REVATIO) 20 MG tablet Take 1-5 tablets by mouth daily as needed. 07/23/19   Vivi Barrack, MD  spironolactone (ALDACTONE) 25 MG tablet TAKE 1 TABLET DAILY 03/10/21   Vivi Barrack, MD  Undecylenic Ac-Zn Undecylenate (FUNGI-NAIL TOE & FOOT EX) Apply 1 application topically every other day.    [provider]  vitamin B-12 (CYANOCOBALAMIN) 1000 MCG tablet Take 1,000 mcg by mouth daily.    [provider]    Current Facility-Administered Medications  Medication Dose Route Frequency Provider Last Rate Last Admin   pantoprazole (PROTONIX) injection 40 mg  40 mg Intravenous Once Hayden Rasmussen, MD       Current Outpatient Medications  Medication Sig Dispense Refill   atorvastatin (LIPITOR) 80 MG tablet TAKE 1 TABLET DAILY AT     6:00PM 90 tablet 0   carvedilol (COREG) 3.125 MG tablet TAKE 1 TABLET TWICE DAILY  WITH MEALS. 180 tablet 0   clopidogrel (PLAVIX) 75 MG tablet TAKE 1 TABLET DAILY 90 tablet 0   Coenzyme Q10 200 MG capsule Take 200 mg by  mouth daily.     docusate sodium (COLACE) 100 MG capsule Take 1 capsule (100 mg total) by mouth 2 (two) times daily. 10 capsule 0   famotidine-calcium carbonate-magnesium hydroxide (PEPCID COMPLETE) 10-800-165 MG chewable tablet Chew 1 tablet by mouth daily as needed (heartburn).     glucose blood (ONE TOUCH ULTRA TEST) test strip Use one strip per test. Test blood sugars 1-4 times daily as instructed. 25 each 5   Homeopathic Products (ZICAM ALLERGY RELIEF NA) Place 1 spray into the nose daily as needed (allergies).     HYDROcodone-acetaminophen (NORCO) 7.5-325 MG tablet Take 1-2 tablets by mouth every 4 (four) hours as needed for severe pain (pain score 7-10). 56 tablet 0   irbesartan (AVAPRO) 300 MG tablet TAKE 1 TABLET DAILY. 90 tablet 0   metFORMIN (GLUCOPHAGE-XR) 500 MG 24 hr tablet Take 2 tablet (1000Mg  total) by mouth daily after supper 180 tablet 3   methocarbamol (ROBAXIN) 500 MG tablet Take 1 tablet (500 mg total) by mouth every 6 (six) hours as needed for muscle spasms. 40 tablet 0   Misc Natural Products (GLUCOSAMINE CHOND COMPLEX/MSM) TABS Take 1 tablet by mouth daily.     Multiple Vitamins-Minerals (MULTIVITAMIN WITH MINERALS) tablet Take 1 tablet by mouth daily.     nitroGLYCERIN (NITROSTAT) 0.4 MG SL tablet Place 1 tablet (0.4 mg total) under the tongue every 5 (five) minutes as needed for chest pain. 100 tablet 1   polyethylene glycol (MIRALAX / GLYCOLAX) 17 g packet Take 17 g by mouth daily as needed for mild constipation. 14 each 0   PROTEIN PO Take 1 Container by mouth 2 (two) times a week.     sildenafil (REVATIO) 20 MG tablet Take 1-5 tablets by mouth daily as needed. 100 tablet 0   spironolactone (ALDACTONE) 25 MG tablet TAKE 1 TABLET DAILY 90 tablet 0   Undecylenic Ac-Zn Undecylenate (FUNGI-NAIL TOE & FOOT EX) Apply 1 application topically every other day.     vitamin B-12 (CYANOCOBALAMIN) 1000 MCG tablet Take 1,000 mcg  by mouth daily.      Allergies as of 06/04/2021 -  Review Complete 06/04/2021  Allergen Reaction Noted   Aspirin Hives 12/06/2006   Other Hives 05/17/2019   Oxycodone Nausea Only 02/04/2019   Contrast media [iodinated diagnostic agents] Itching and Rash 03/31/2014   Norvasc [amlodipine besylate] Cough 09/23/2013     Review of Systems:    Constitutional: No weight loss, fever or chills Skin: No rash  Cardiovascular: No chest pain Respiratory: No SOB Gastrointestinal: See HPI and otherwise negative Genitourinary: No dysuria Neurological: +dizziness Musculoskeletal: No new muscle or joint pain Hematologic: No bruising Psychiatric: No history of depression or anxiety    Physical Exam:  Vital signs in last 24 hours: Temp:  [97.5 F (36.4 C)-98 F (36.7 C)] 97.5 F (36.4 C) (12/09 1212) Pulse Rate:  [92-104] 101 (12/09 1345) Resp:  [15-25] 16 (12/09 1345) BP: (112-136)/(76-89) 136/79 (12/09 1330) SpO2:  [98 %-100 %] 100 % (12/09 1345)   General:   Pleasant Caucasian male appears to be in NAD, Well developed, Well nourished, alert and cooperative Head:  Normocephalic and atraumatic. Eyes:   PEERL, EOMI. No icterus. Conjunctiva pink. Ears:  Normal auditory acuity. Neck:  Supple Throat: Oral cavity and pharynx without inflammation, swelling or lesion.  Lungs: Respirations even and unlabored. Lungs clear to auscultation bilaterally.   No wheezes, crackles, or rhonchi.  Heart: Normal S1, S2. No MRG. Regular rate and rhythm. No peripheral edema, cyanosis or pallor.  Abdomen:  Soft, nondistended, nontender. No rebound or guarding. Normal bowel sounds. No appreciable masses or hepatomegaly. Rectal:  Not performed.  Msk:  Symmetrical without gross deformities. Peripheral pulses intact. +healing surgical incision right knee, moderate effusion, mild ttp Extremities:  Without edema, no deformity or joint abnormality.  Neurologic:  Alert and  oriented x4;  grossly normal neurologically. Skin:   Dry and intact without significant lesions or  rashes. Psychiatric: Demonstrates good judgement and reason without abnormal affect or behaviors.   LAB RESULTS: Recent Labs    06/04/21 1231  WBC 13.3*  HGB 8.5*  HCT 25.5*  PLT 409*   PT/INR Recent Labs    06/04/21 1231  LABPROT 13.4  INR 1.0   CMP     Component Value Date/Time   NA 134 (L) 06/04/2021 1231   NA 143 08/18/2016 0814   K 4.5 06/04/2021 1231   CL 102 06/04/2021 1231   CO2 22 06/04/2021 1231   GLUCOSE 255 (H) 06/04/2021 1231   BUN 42 (H) 06/04/2021 1231   BUN 13 08/18/2016 0814   CREATININE 1.04 06/04/2021 1231   CREATININE 0.96 03/05/2020 1009   CALCIUM 8.9 06/04/2021 1231   PROT 6.3 (L) 06/04/2021 1231   PROT 6.7 08/18/2016 0814   ALBUMIN 3.5 06/04/2021 1231   ALBUMIN 4.3 08/18/2016 0814   AST 17 06/04/2021 1231   ALT 20 06/04/2021 1231   ALKPHOS 84 06/04/2021 1231   BILITOT 0.7 06/04/2021 1231   BILITOT 0.6 08/18/2016 0814   GFRNONAA >60 06/04/2021 1231   GFRAA >60 10/21/2019 0111      Impression / Plan:   Impression: 1.  Acute Anemia with Description of Melenic stools: Hemoglobin 8.5 (13.3 on 05/25/2021), BUN elevated at 42, patient describes melenic stools for the past 3 weeks, increasing in frequency over the past week with increasing fatigue and dizziness, does take Plavix; likely upper GI bleed 2.  Right Knee replacement: 05/11/2021 3.  Chronic anticoagulation: On Plavix   Plan: 1.  Last dose of Plavix  06/02/2021 AM per the patient.  Continue to hold now. 2.  Patient will need an EGD for further evaluation.  Scheduled for tomorrow with Dr. Carlean Purl.  Did discuss risks, benefits, limitations and alternatives the patient agrees to proceed. 3.  Continue to monitor hemoglobin with transfusion as needed less than 7 4.  Agree with Pantoprazole 40 mg IV twice daily 5.  Clear liquid diet today and npo at midnight.  Thank you for your kind consultation, we will continue to follow.  Lavone Nian Lemmon  06/04/2021, 1:49 PM    [facility]  Attending   I have taken an interval history, reviewed the chart and examined the patient. I agree with the Advanced Practitioner's note, impression and recommendations.  Majority the medical decision-making in the formulation of the assessment and plan were performed by me.  Melena + elevated BUN/creatinine ratio so we suspect UGI bleed.  Plavix not washed out but appropriate to evaluate w/ EGd and possibly treat bleeding lesions.  The risks and benefits as well as alternatives of endoscopic procedure(s) have been discussed and reviewed. All questions answered. The patient agrees to proceed.  Seen w/ Dr. Marylyn Ishihara of [facility] and agree w/ his plan to transfuse 1 u RBC given presyncope and current Hgb.  Gatha Mayer, MD, [facility] 06/04/2021 3:14 PM

## 2021-06-04 NOTE — Telephone Encounter (Signed)
Agree with plan if he is having extreme weakness. Needs to be evaluated urgently.   Algis Greenhouse. [name], MD 06/04/2021 9:16 AM

## 2021-06-05 ENCOUNTER — Encounter (HOSPITAL_COMMUNITY)
Admission: EM | Disposition: A | Discharge status: Home / Self Care | Payer: Self-pay | Source: Home / Self Care | Attending: Emergency Medicine

## 2021-06-05 ENCOUNTER — Inpatient Hospital Stay (HOSPITAL_COMMUNITY): Payer: Medicare Other | Admitting: Certified Registered Nurse Anesthetist

## 2021-06-05 ENCOUNTER — Encounter (HOSPITAL_COMMUNITY): Payer: Self-pay | Admitting: Internal Medicine

## 2021-06-05 DIAGNOSIS — K922 Gastrointestinal hemorrhage, unspecified: Secondary | ICD-10-CM | POA: Diagnosis present

## 2021-06-05 DIAGNOSIS — Z79899 Other long term (current) drug therapy: Secondary | ICD-10-CM | POA: Diagnosis not present

## 2021-06-05 DIAGNOSIS — K449 Diaphragmatic hernia without obstruction or gangrene: Secondary | ICD-10-CM | POA: Diagnosis not present

## 2021-06-05 DIAGNOSIS — E119 Type 2 diabetes mellitus without complications: Secondary | ICD-10-CM | POA: Diagnosis not present

## 2021-06-05 DIAGNOSIS — I2510 Atherosclerotic heart disease of native coronary artery without angina pectoris: Secondary | ICD-10-CM | POA: Diagnosis not present

## 2021-06-05 DIAGNOSIS — K260 Acute duodenal ulcer with hemorrhage: Secondary | ICD-10-CM

## 2021-06-05 DIAGNOSIS — K921 Melena: Secondary | ICD-10-CM | POA: Diagnosis not present

## 2021-06-05 DIAGNOSIS — K5710 Diverticulosis of small intestine without perforation or abscess without bleeding: Secondary | ICD-10-CM | POA: Diagnosis not present

## 2021-06-05 DIAGNOSIS — D649 Anemia, unspecified: Secondary | ICD-10-CM | POA: Diagnosis not present

## 2021-06-05 DIAGNOSIS — I10 Essential (primary) hypertension: Secondary | ICD-10-CM | POA: Diagnosis not present

## 2021-06-05 DIAGNOSIS — E785 Hyperlipidemia, unspecified: Secondary | ICD-10-CM | POA: Diagnosis not present

## 2021-06-05 DIAGNOSIS — Z20822 Contact with and (suspected) exposure to covid-19: Secondary | ICD-10-CM | POA: Diagnosis not present

## 2021-06-05 DIAGNOSIS — K269 Duodenal ulcer, unspecified as acute or chronic, without hemorrhage or perforation: Secondary | ICD-10-CM | POA: Diagnosis not present

## 2021-06-05 HISTORY — PX: ESOPHAGOGASTRODUODENOSCOPY (EGD) WITH PROPOFOL: SHX5813

## 2021-06-05 HISTORY — PX: BIOPSY: SHX5522

## 2021-06-05 LAB — GLUCOSE, CAPILLARY: Glucose-Capillary: 155 mg/dL — ABNORMAL HIGH (ref 70–99)

## 2021-06-05 LAB — HEMOGLOBIN: Hemoglobin: 8.9 g/dL — ABNORMAL LOW (ref 13.0–17.0)

## 2021-06-05 SURGERY — ESOPHAGOGASTRODUODENOSCOPY (EGD) WITH PROPOFOL
Anesthesia: Monitor Anesthesia Care

## 2021-06-05 MED ORDER — GUAIFENESIN 100 MG/5ML PO LIQD: 5.0000 mL | ORAL | Status: DC | PRN

## 2021-06-05 MED ORDER — ACETAMINOPHEN 325 MG PO TABS: 650.0000 mg | ORAL_TABLET | Freq: Four times a day (QID) | ORAL | Status: DC | PRN

## 2021-06-05 MED ORDER — CARVEDILOL 3.125 MG PO TABS: 3.1250 mg | ORAL_TABLET | Freq: Every day | ORAL | Status: DC

## 2021-06-05 MED ORDER — PANTOPRAZOLE SODIUM 40 MG PO TBEC: 40.0000 mg | DELAYED_RELEASE_TABLET | Freq: Two times a day (BID) | ORAL | 0 refills | Status: DC

## 2021-06-05 MED ORDER — METOPROLOL TARTRATE 5 MG/5ML IV SOLN: 5.0000 mg | INTRAVENOUS | Status: DC | PRN

## 2021-06-05 MED ORDER — ONDANSETRON HCL 4 MG PO TABS: 4.0000 mg | ORAL_TABLET | Freq: Four times a day (QID) | ORAL | Status: DC | PRN

## 2021-06-05 MED ORDER — ONDANSETRON HCL 4 MG/2ML IJ SOLN: 4.0000 mg | Freq: Four times a day (QID) | INTRAMUSCULAR | Status: DC | PRN

## 2021-06-05 MED ORDER — IRBESARTAN 300 MG PO TABS: 300.0000 mg | ORAL_TABLET | Freq: Every day | ORAL | Status: DC

## 2021-06-05 MED ORDER — METHOCARBAMOL 500 MG PO TABS: 500.0000 mg | ORAL_TABLET | Freq: Four times a day (QID) | ORAL | Status: DC | PRN

## 2021-06-05 MED ORDER — SODIUM CHLORIDE 0.9 % IV SOLN
INTRAVENOUS | Status: AC | PRN
  Administered 2021-06-05: 500 mL via INTRAVENOUS

## 2021-06-05 MED ORDER — DEXTROSE-NACL 5-0.45 % IV SOLN: INTRAVENOUS | Status: DC

## 2021-06-05 MED ORDER — ACETAMINOPHEN 650 MG RE SUPP: 650.0000 mg | Freq: Four times a day (QID) | RECTAL | Status: DC | PRN

## 2021-06-05 MED ORDER — SODIUM CHLORIDE 0.9 % IV SOLN: INTRAVENOUS | Status: DC

## 2021-06-05 MED ORDER — SENNOSIDES-DOCUSATE SODIUM 8.6-50 MG PO TABS: 1.0000 | ORAL_TABLET | Freq: Every evening | ORAL | Status: DC | PRN

## 2021-06-05 MED ORDER — TRAZODONE HCL 100 MG PO TABS: 50.0000 mg | ORAL_TABLET | Freq: Every evening | ORAL | Status: DC | PRN

## 2021-06-05 MED ORDER — INSULIN ASPART 100 UNIT/ML IJ SOLN
0.0000 [IU] | Freq: Three times a day (TID) | INTRAMUSCULAR | Status: DC
  Filled 2021-06-05: qty 0.15

## 2021-06-05 MED ORDER — HYDROCODONE-ACETAMINOPHEN 7.5-325 MG PO TABS
1.0000 | ORAL_TABLET | ORAL | Status: DC | PRN
Start: 2021-06-05 — End: 2021-06-05

## 2021-06-05 MED ORDER — SODIUM CHLORIDE 0.9 % IV SOLN: Freq: Once | INTRAVENOUS | Status: DC

## 2021-06-05 MED ORDER — PROPOFOL 500 MG/50ML IV EMUL
INTRAVENOUS | Status: DC | PRN
  Administered 2021-06-05: 100 ug/kg/min via INTRAVENOUS

## 2021-06-05 MED ORDER — PROPOFOL 10 MG/ML IV BOLUS
INTRAVENOUS | Status: DC | PRN
  Administered 2021-06-05: 20 mg via INTRAVENOUS
  Administered 2021-06-05: 50 mg via INTRAVENOUS

## 2021-06-05 MED ORDER — HYDRALAZINE HCL 20 MG/ML IJ SOLN: 10.0000 mg | INTRAMUSCULAR | Status: DC | PRN

## 2021-06-05 MED ORDER — LIDOCAINE 2% (20 MG/ML) 5 ML SYRINGE
INTRAMUSCULAR | Status: DC | PRN
  Administered 2021-06-05: 40 mg via INTRAVENOUS

## 2021-06-05 MED ORDER — IPRATROPIUM-ALBUTEROL 0.5-2.5 (3) MG/3ML IN SOLN: 3.0000 mL | RESPIRATORY_TRACT | Status: DC | PRN

## 2021-06-05 MED ORDER — ADULT MULTIVITAMIN W/MINERALS CH: 1.0000 | ORAL_TABLET | Freq: Every day | ORAL | Status: DC

## 2021-06-05 MED ORDER — INSULIN ASPART 100 UNIT/ML IJ SOLN: 0.0000 [IU] | Freq: Three times a day (TID) | INTRAMUSCULAR | Status: DC

## 2021-06-05 MED ORDER — LACTATED RINGERS IV SOLN: INTRAVENOUS | Status: DC | PRN

## 2021-06-05 MED ORDER — ATORVASTATIN CALCIUM 40 MG PO TABS: 80.0000 mg | ORAL_TABLET | Freq: Every day | ORAL | Status: DC

## 2021-06-05 SURGICAL SUPPLY — 15 items

## 2021-06-05 NOTE — Care Management CC44 (Signed)
Condition Code 44 Documentation Completed  Patient Details  Name: [name] [name] MRN: [id number] Date of Birth: [birth year]   Condition Code 44 given:  Yes Patient signature on Condition Code 44 notice: yes   Documentation of 2 MD's agreement:    Code 44 added to claim:       Illene Regulus, LCSW 06/05/2021, 1:27 PM

## 2021-06-05 NOTE — Op Note (Addendum)
[facility] Patient Name: [name] [name] Procedure Date: 06/05/2021 MRN: [id number] Attending MD: Gatha Mayer , MD Date of Birth: Apr 02, 1951 CSN: [id number] Age: 70 Admit Type: Inpatient Procedure:                Upper GI endoscopy Indications:              Melena Providers:                Gatha Mayer, MD, Dulcy Fanny, Luan Moore, Technician, Cleda Daub, CRNA Referring MD:              Medicines:                Propofol per Anesthesia, Monitored Anesthesia Care Complications:            No immediate complications. Estimated Blood Loss:     Estimated blood loss was minimal. Procedure:                Pre-Anesthesia Assessment:                           - Prior to the procedure, a History and Physical                            was performed, and patient medications and                            allergies were reviewed. The patient's tolerance of                            previous anesthesia was also reviewed. The risks                            and benefits of the procedure and the sedation                            options and risks were discussed with the patient.                            All questions were answered, and informed consent                            was obtained. Prior Anticoagulants: The patient                            last took Plavix (clopidogrel) 4 days prior to the                            procedure. ASA Grade Assessment: III - A patient                            with severe systemic disease. After reviewing the  risks and benefits, the patient was deemed in                            satisfactory condition to undergo the procedure.                           After obtaining informed consent, the endoscope was                            passed under direct vision. Throughout the                            procedure, the patient's blood pressure, pulse, and                             oxygen saturations were monitored continuously. The                            [id number]-[id number] ([id number]) Olympus endoscope was introduced                            through the mouth, and advanced to the second part                            of duodenum. The upper GI endoscopy was                            accomplished without difficulty. The patient                            tolerated the procedure well. Scope In: Scope Out: Findings:      One non-bleeding cratered duodenal ulcer with a clean ulcer base       (Forrest Class III) was found in the second portion of the duodenum. The       lesion was 10 mm in largest dimension.      A medium non-bleeding diverticulum was found in the second portion of       the duodenum.      The exam was otherwise without abnormality.      The cardia and gastric fundus were normal on retroflexion.      Biopsies were taken with a cold forceps in the gastric body and in the       gastric antrum for Helicobacter pylori testing using CLOtest.       Verification of patient identification for the specimen was done.       Estimated blood loss was minimal. Impression:               - Non-bleeding duodenal ulcer with a clean ulcer                            base (Forrest Class III). - one tiny red spot only                           - Non-bleeding duodenal diverticulum.                           -  The examination was otherwise normal.                           - Biopsies were taken with a cold forceps for                            Helicobacter pylori testing using CLOtest. Moderate Sedation:      Not Applicable - Patient had care per Anesthesia. Recommendation:           - Return patient to hospital ward for possible                            discharge same day.                           1) Pantoprazole 40 mg bid                           2) OK to dc later today I think (clean-based ulcer)                           3) CBC my office 12/13 and i will  f/u and arrange                            office visit                           4) Stay off clopidogrel for now - I will recommend                            resumption after I see CBC                           5) I will f/u and treat H pylori if CLO test +                           6) screening colonoscopy TBD 2023 Procedure Code(s):        --- Professional ---                           [id number], Esophagogastroduodenoscopy, flexible,                            transoral; with biopsy, single or multiple Diagnosis Code(s):        --- Professional ---                           K26.9, Duodenal ulcer, unspecified as acute or                            chronic, without hemorrhage or perforation                           K92.1, Melena (includes Hematochezia)  K57.10, Diverticulosis of small intestine without                            perforation or abscess without bleeding CPT copyright 2019 [employer]. All rights reserved. The codes documented in this report are preliminary and upon coder review may  be revised to meet current compliance requirements. Gatha Mayer, MD 06/05/2021 11:16:37 AM This report has been signed electronically. Number of Addenda: 0

## 2021-06-05 NOTE — Discharge Summary (Signed)
Physician Discharge Summary  MISSAEL FERRARI TDV:[id number] DOB: Jun 28, 1950 DOA: 06/04/2021  PCP: Vivi Barrack, MD  Admit date: 06/04/2021 Discharge date: 06/05/2021  Admitted From: Home Disposition: Home  Recommendations for Outpatient Follow-up:  Follow up with PCP in 1-2 weeks Avoid any NSAIDs, aspirin at home Plavix has been discontinued for now until seen by outpatient GI, their service will resume medication if and when appropriate CBC to be performed in 3 days   Discharge Condition: Stable CODE STATUS: Full code Diet recommendation: Diabetic  Brief/Interim Summary: 70 year old with history of CAD on Plavix, HTN, GERD, CAD, DM2, HLD comes to the hospital with complaints of weakness and dark stool for the past week.  GI team was consulted, patient was started on PPI.  Plavix was held, patient underwent endoscopy on 12/10 which showed clean-based ulcer.  It was recommended to hold Plavix until seen by outpatient GI during follow-up in the meantime continue PPI twice daily.  Hemoglobin remained stable at this time after transfusion.  If patient tolerates his lunch today he can be discharged.  All the questions have been answered by me for the patient.   Assessment & Plan:   Principal Problem:   GIB (gastrointestinal bleeding)     GI bleeding, secondary to ulcer Symptomatic anemia - Initially this was treated with PPI IV twice daily.  He underwent endoscopy on 12/10 which showed clean-based ulcer.  Biopsies were taken.  He was started on oral diet and cleared by GI to be discharged with outpatient follow-up.  It was advised to hold his Plavix until seen by outpatient GI and will be resumed if and when appropriate by their service.   Essential hypertension - Resume his home regimen   Diabetes mellitus type 2 -Home meds    Hyperlipidemia -Home Lipitor on hold   CAD status post PCI 2015 - Continue statin.  For now Plavix has been discontinued, I will be resumed after his  outpatient follow-up with GI        There is no height or weight on file to calculate BMI.         Discharge Diagnoses:  Principal Problem:   Upper GI bleed Active Problems:   Acute duodenal ulcer with hemorrhage      Consultations: [facility]  Subjective: Patient feels well, no complaints.  He would like to go home if he tolerates his lunch since he is cleared by GI.  Discharge Exam: Vitals:   06/05/21 1123 06/05/21 1133  BP: 124/77 133/75  Pulse: 80 73  Resp: 17 14  Temp:    SpO2: 97% 99%   Vitals:   06/05/21 1022 06/05/21 1113 06/05/21 1123 06/05/21 1133  BP: (!) 143/77 (!) 134/91 124/77 133/75  Pulse: 80 97 80 73  Resp: 14 (!) 22 17 14   Temp: 98.8 F (37.1 C) 97.9 F (36.6 C)    TempSrc: Oral Oral    SpO2: 100% 97% 97% 99%    General: Pt is alert, awake, not in acute distress Cardiovascular: RRR, S1/S2 +, no rubs, no gallops Respiratory: CTA bilaterally, no wheezing, no rhonchi Abdominal: Soft, NT, ND, bowel sounds + Extremities: no edema, no cyanosis  Discharge Instructions   Allergies as of 06/05/2021       Reactions   Aspirin Hives, Swelling, Other (See Comments)   Angioedema   Oxycodone Nausea Only   Contrast Media [iodinated Diagnostic Agents] Hives, Itching, Rash, Other (See Comments)   Developed hives despite pre-treatment   Norvasc [amlodipine Besylate] Other (  See Comments), Cough   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   carvedilol 3.125 MG tablet Commonly known as: COREG TAKE 1 TABLET TWICE DAILY  WITH MEALS. What changed:  when to take this additional instructions   Coenzyme Q10 200 MG capsule Take 200 mg by mouth  daily.   docusate sodium 100 MG capsule Commonly known as: COLACE Take 1 capsule (100 mg total) by mouth 2 (two) times daily.   famotidine-calcium carbonate-magnesium hydroxide 10-800-165 MG chewable tablet Commonly known as: PEPCID COMPLETE Chew 0.5 tablets by mouth every 6 (six) hours as needed (heartburn).   FUNGI-NAIL TOE & FOOT EX Apply 1 application topically See admin instructions. Apply to bilateral big toes every other day   glucose blood test strip Commonly known as: ONE TOUCH ULTRA TEST Use one strip per test. Test blood sugars 1-4 times daily as instructed.   HYDROcodone-acetaminophen 7.5-325 MG tablet Commonly known as: NORCO Take 1-2 tablets by mouth every 4 (four) hours as needed for severe pain (pain score 7-10). What changed:  how much to take when to take this   irbesartan 300 MG tablet Commonly known as: AVAPRO TAKE 1 TABLET DAILY. What changed: when to take this   metFORMIN 500 MG 24 hr tablet Commonly known as: GLUCOPHAGE-XR Take 2 tablet (1000Mg  total) by mouth daily after supper What changed:  how much to take when to take this additional instructions   methocarbamol 500 MG tablet Commonly known as: ROBAXIN Take 1 tablet (500 mg total) by mouth every 6 (six) hours as needed for muscle spasms. What changed: when to take this   multivitamin with minerals tablet Take 1 tablet by mouth daily.   nitroGLYCERIN 0.4 MG SL tablet Commonly known as: NITROSTAT Place 1 tablet (0.4 mg total) under the tongue every 5 (five) minutes as needed for chest pain.   pantoprazole 40 MG tablet Commonly known as: Protonix Take 1 tablet (40 mg total) by mouth 2 (two) times daily before a meal.   polyethylene glycol 17 g packet Commonly known as: MIRALAX / GLYCOLAX Take 17 g by mouth daily as needed for mild constipation.   PROTEIN PO Take 1 Container by mouth 2 (two) times a week.   sildenafil 20 MG tablet Commonly known as: REVATIO Take 1-5 tablets by  mouth daily as needed.   sodium chloride 0.65 % Soln nasal spray Commonly known as: OCEAN Place 1 spray into both nostrils as needed for congestion.   spironolactone 25 MG tablet Commonly known as: ALDACTONE TAKE 1 TABLET DAILY What changed: when to take this   vitamin B-12 1000 MCG tablet Commonly known as: CYANOCOBALAMIN Take 1,000 mcg by mouth daily.   ZICAM ALLERGY RELIEF NA Place 1 spray into the nose daily as needed (colds).        Follow-up Information     Gatha Mayer, MD Follow up on 06/08/2021.   Specialty: Gastroenterology Why: Go to lab in basement and have blood countr checked (730-5) no need to fast Follow-up appointmet will be arranged after that and will advise re: resuming Plavix (clopidogrel) then also Contact information: 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 If you have any questions about your discharge medications or the care you received while you were in the hospital after you are discharged, you can call the unit and asked to speak with the hospitalist on call if the hospitalist that took care of you is not available. Once you are discharged, your primary care physician will handle any further medical issues. Please note that no refills for any discharge medications will be authorized once you are discharged, as it is imperative that you return to your primary care physician (or establish a relationship with a primary care physician if you do not have one) for your aftercare needs so  that they can reassess your need for medications and monitor your lab values.   Procedures/Studies: No results found.   The results of significant diagnostics from this hospitalization (including imaging, microbiology, ancillary and laboratory) are listed below for reference.     Microbiology: Recent Results (from the past 240 hour(s))  Resp Panel by RT-PCR (Flu A&B, Covid) Nasopharyngeal Swab     Status: None   Collection Time: 06/04/21 12:22 PM   Specimen: Nasopharyngeal Swab; Nasopharyngeal(NP) swabs in vial transport medium  Result Value Ref Range Status   SARS Coronavirus 2 by RT PCR NEGATIVE NEGATIVE Final    Comment: (NOTE) SARS-CoV-2 target nucleic acids are NOT DETECTED.  The SARS-CoV-2 RNA is generally detectable in upper respiratory specimens during the acute phase of infection. The lowest concentration of SARS-CoV-2 viral copies this assay can detect is 138 copies/mL. A negative result does not preclude SARS-Cov-2 infection and should not be used as the sole basis for treatment or other patient management decisions. A negative result may occur with  improper specimen collection/handling, submission of specimen other than nasopharyngeal swab, presence of viral mutation(s) within the areas targeted by this assay, and inadequate number of viral copies(<138 copies/mL). A negative result must be combined with clinical observations, patient history, and epidemiological information. The expected result is Negative.  Fact Sheet for Patients:  EntrepreneurPulse.com.au  Fact Sheet for Healthcare Providers:  IncredibleEmployment.be  This test is no t yet approved or cleared by the [place] FDA and  has been authorized for detection and/or diagnosis of SARS-CoV-2 by FDA under an Emergency Use Authorization (EUA). This EUA will remain  in effect (meaning this test can be used) for the duration of the COVID-19 declaration under Section  564(b)(1) of the Act, 21 U.S.C.section 360bbb-3(b)(1), unless the authorization is terminated  or revoked sooner.       Influenza A by PCR NEGATIVE NEGATIVE Final   Influenza B by PCR NEGATIVE NEGATIVE Final    Comment: (NOTE) The Xpert Xpress SARS-CoV-2/FLU/RSV plus assay is intended as an aid in the diagnosis of influenza from Nasopharyngeal swab specimens and should not be used as a sole basis for treatment. Nasal washings and aspirates are unacceptable for Xpert Xpress SARS-CoV-2/FLU/RSV testing.  Fact Sheet for Patients: EntrepreneurPulse.com.au  Fact Sheet  for Healthcare Providers: IncredibleEmployment.be  This test is not yet approved or cleared by the [place] and has been authorized for detection and/or diagnosis of SARS-CoV-2 by FDA under an Emergency Use Authorization (EUA). This EUA will remain in effect (meaning this test can be used) for the duration of the COVID-19 declaration under Section 564(b)(1) of the Act, 21 U.S.C. section 360bbb-3(b)(1), unless the authorization is terminated or revoked.  Performed at [facility], [place] [address] [place], [place] [address]      Labs: BNP (last 3 results) No results for input(s): BNP in the last 8760 hours. Basic Metabolic Panel: Recent Labs  Lab 06/04/21 1231  NA 134*  K 4.5  CL 102  CO2 22  GLUCOSE 255*  BUN 42*  CREATININE 1.04  CALCIUM 8.9   Liver Function Tests: Recent Labs  Lab 06/04/21 1231  AST 17  ALT 20  ALKPHOS 84  BILITOT 0.7  PROT 6.3*  ALBUMIN 3.5   Recent Labs  Lab 06/04/21 1231  LIPASE 49   No results for input(s): AMMONIA in the last 168 hours. CBC: Recent Labs  Lab 06/04/21 1231 06/04/21 1445 06/04/21 2256 06/05/21 0645  WBC 13.3*  --   --   --   NEUTROABS 12.0*  --   --   --   HGB 8.5* 7.7* 9.2* 8.9*  HCT 25.5*  --   --   --   MCV 97.7  --   --   --   PLT 409*  --   --   --    Cardiac Enzymes: No  results for input(s): CKTOTAL, CKMB, CKMBINDEX, TROPONINI in the last 168 hours. BNP: Invalid input(s): POCBNP CBG: Recent Labs  Lab 06/05/21 1041  GLUCAP 155*   D-Dimer No results for input(s): DDIMER in the last 72 hours. Hgb A1c No results for input(s): HGBA1C in the last 72 hours. Lipid Profile No results for input(s): CHOL, HDL, LDLCALC, TRIG, CHOLHDL, LDLDIRECT in the last 72 hours. Thyroid function studies No results for input(s): TSH, T4TOTAL, T3FREE, THYROIDAB in the last 72 hours.  Invalid input(s): FREET3 Anemia work up No results for input(s): VITAMINB12, FOLATE, FERRITIN, TIBC, IRON, RETICCTPCT in the last 72 hours. Urinalysis    Component Value Date/Time   COLORURINE YELLOW 06/04/2021 2207   APPEARANCEUR CLEAR 06/04/2021 2207   LABSPEC 1.026 06/04/2021 2207   PHURINE 5.0 06/04/2021 2207   GLUCOSEU 150 (A) 06/04/2021 2207   GLUCOSEU NEGATIVE 09/12/2006 0747   HGBUR NEGATIVE 06/04/2021 2207   BILIRUBINUR NEGATIVE 06/04/2021 2207   BILIRUBINUR Positive 10/22/2019 1259   KETONESUR 5 (A) 06/04/2021 2207   PROTEINUR NEGATIVE 06/04/2021 2207   UROBILINOGEN 0.2 10/22/2019 1259   UROBILINOGEN 0.2 mg/dL 09/12/2006 0747   NITRITE NEGATIVE 06/04/2021 2207   LEUKOCYTESUR NEGATIVE 06/04/2021 2207   Sepsis Labs Invalid input(s): PROCALCITONIN,  WBC,  LACTICIDVEN Microbiology Recent Results (from the past 240 hour(s))  Resp Panel by RT-PCR (Flu A&B, Covid) Nasopharyngeal Swab     Status: None   Collection Time: 06/04/21 12:22 PM   Specimen: Nasopharyngeal Swab; Nasopharyngeal(NP) swabs in vial transport medium  Result Value Ref Range Status   SARS Coronavirus 2 by RT PCR NEGATIVE NEGATIVE Final    Comment: (NOTE) SARS-CoV-2 target nucleic acids are NOT DETECTED.  The SARS-CoV-2 RNA is generally detectable in upper respiratory specimens during the acute phase of infection. The lowest concentration of SARS-CoV-2 viral copies this assay can detect is 138 copies/mL. A  negative result does  not preclude SARS-Cov-2 infection and should not be used as the sole basis for treatment or other patient management decisions. A negative result may occur with  improper specimen collection/handling, submission of specimen other than nasopharyngeal swab, presence of viral mutation(s) within the areas targeted by this assay, and inadequate number of viral copies(<138 copies/mL). A negative result must be combined with clinical observations, patient history, and epidemiological information. The expected result is Negative.  Fact Sheet for Patients:  EntrepreneurPulse.com.au  Fact Sheet for Healthcare Providers:  IncredibleEmployment.be  This test is no t yet approved or cleared by the [place] FDA and  has been authorized for detection and/or diagnosis of SARS-CoV-2 by FDA under an Emergency Use Authorization (EUA). This EUA will remain  in effect (meaning this test can be used) for the duration of the COVID-19 declaration under Section 564(b)(1) of the Act, 21 U.S.C.section 360bbb-3(b)(1), unless the authorization is terminated  or revoked sooner.       Influenza A by PCR NEGATIVE NEGATIVE Final   Influenza B by PCR NEGATIVE NEGATIVE Final    Comment: (NOTE) The Xpert Xpress SARS-CoV-2/FLU/RSV plus assay is intended as an aid in the diagnosis of influenza from Nasopharyngeal swab specimens and should not be used as a sole basis for treatment. Nasal washings and aspirates are unacceptable for Xpert Xpress SARS-CoV-2/FLU/RSV testing.  Fact Sheet for Patients: EntrepreneurPulse.com.au  Fact Sheet for Healthcare Providers: IncredibleEmployment.be  This test is not yet approved or cleared by the [place] FDA and has been authorized for detection and/or diagnosis of SARS-CoV-2 by FDA under an Emergency Use Authorization (EUA). This EUA will remain in effect (meaning this test can  be used) for the duration of the COVID-19 declaration under Section 564(b)(1) of the Act, 21 U.S.C. section 360bbb-3(b)(1), unless the authorization is terminated or revoked.  Performed at [facility], [place] [address], [place], [place] [address]      Time coordinating discharge:  I have spent 35 minutes face to face with the patient and on the ward discussing the patients care, assessment, plan and disposition with other care givers. >50% of the time was devoted counseling the patient about the risks and benefits of treatment/Discharge disposition and coordinating care.   SIGNED:   Damita Lack, MD  [facility] 06/05/2021, 12:48 PM   If 7PM-7AM, please contact night-coverage

## 2021-06-05 NOTE — Interval H&P Note (Signed)
History and Physical Interval Note:  06/05/2021 10:26 AM  [name] [name]  has presented today for surgery, with the diagnosis of Melena.  The various methods of treatment have been discussed with the patient and family. After consideration of risks, benefits and other options for treatment, the patient has consented to  Procedure(s): ESOPHAGOGASTRODUODENOSCOPY (EGD) WITH PROPOFOL (N/A) as a surgical intervention.  The patient's history has been reviewed, patient examined, no change in status, stable for surgery.  I have reviewed the patient's chart and labs.  Questions were answered to the patient's satisfaction.     [name]

## 2021-06-05 NOTE — Anesthesia Preprocedure Evaluation (Addendum)
Anesthesia Evaluation  Patient identified by MRN, date of birth, ID band  Reviewed: Allergy & Precautions, NPO status , Patient's Chart, lab work & pertinent test results  Airway Mallampati: II       Dental   Pulmonary    breath sounds clear to auscultation       Cardiovascular hypertension, + CAD   Rhythm:Regular Rate:Normal     Neuro/Psych    GI/Hepatic hiatal hernia, GERD  ,  Endo/Other  diabetes  Renal/GU Renal disease     Musculoskeletal   Abdominal   Peds  Hematology   Anesthesia Other Findings   Reproductive/Obstetrics                            Anesthesia Physical Anesthesia Plan  ASA: 3  Anesthesia Plan: MAC   Post-op Pain Management:    Induction:   PONV Risk Score and Plan: 1 and Propofol infusion and Treatment may vary due to age or medical condition  Airway Management Planned: Simple Face Mask  Additional Equipment:   Intra-op Plan:   Post-operative Plan:   Informed Consent: I have reviewed the patients History and Physical, chart, labs and discussed the procedure including the risks, benefits and alternatives for the proposed anesthesia with the patient or authorized representative who has indicated his/her understanding and acceptance.     Dental advisory given  Plan Discussed with: Anesthesiologist and CRNA  Anesthesia Plan Comments:        Anesthesia Quick Evaluation

## 2021-06-05 NOTE — Progress Notes (Signed)
PROGRESS NOTE    [name] [name]  QQV:[id number] DOB: 15-Feb-1951 DOA: 06/04/2021 PCP: Vivi Barrack, MD   Brief Narrative:   70 year old with history of CAD on Plavix, HTN, GERD, CAD, DM2, HLD comes to the hospital with complaints of weakness and dark stool for the past week.  GI team was consulted, patient was started on PPI.  Plan is to allow Plavix washout and eventually perform endoscopy.  Assessment & Plan:   Principal Problem:   GIB (gastrointestinal bleeding)   GI bleeding, likely upper Symptomatic anemia - Elevated BUN.  Suspect upper GI bleed.  Plan is for endoscopy per gastroenterology in the meantime continue IV PPI.  Status post 1 unit PRBC transfusion.  Essential hypertension -Currently on hold as patient is NPO.  IV as needed hydralazine and Lopressor  Diabetes mellitus type 2 -Home meds on hold.  Sliding scale and Accu-Cheks  Hyperlipidemia -Home Lipitor on hold  CAD status post PCI 2015 - Continue statin.  Plavix currently on hold.    DVT prophylaxis: SCDs Start: 06/05/21 3329 Code Status: Full code Family Communication:  Brother at bedside  Status is: Inpatient  Remains inpatient appropriate because: EGD today cont IV PPI       Subjective: Doing ok, no bowel movement since yesterday  Review of Systems Otherwise negative except as per HPI, including: General: Denies fever, chills, night sweats or unintended weight loss. Resp: Denies cough, wheezing, shortness of breath. Cardiac: Denies chest pain, palpitations, orthopnea, paroxysmal nocturnal dyspnea. GI: Denies abdominal pain, nausea, vomiting, diarrhea or constipation GU: Denies dysuria, frequency, hesitancy or incontinence MS: Denies muscle aches, joint pain or swelling Neuro: Denies headache, neurologic deficits (focal weakness, numbness, tingling), abnormal gait Psych: Denies anxiety, depression, SI/HI/AVH Skin: Denies new rashes or lesions ID: Denies sick contacts, exotic exposures,  travel  Examination:  General exam: Appears calm and comfortable  Respiratory system: Clear to auscultation. Respiratory effort normal. Cardiovascular system: S1 & S2 heard, RRR. No JVD, murmurs, rubs, gallops or clicks. No pedal edema. Gastrointestinal system: Abdomen is nondistended, soft and nontender. No organomegaly or masses felt. Normal bowel sounds heard. Central nervous system: Alert and oriented. No focal neurological deficits. Extremities: Symmetric 5 x 5 power. Skin: No rashes, lesions or ulcers Psychiatry: Judgement and insight appear normal. Mood & affect appropriate.     Objective: Vitals:   06/05/21 0015 06/05/21 0230 06/05/21 0400 06/05/21 0600  BP: 140/75 140/81 126/72 124/76  Pulse: 74 75 70 69  Resp: 20 20  17   Temp:      TempSrc:      SpO2: 97% 99% 98% 97%    Intake/Output Summary (Last 24 hours) at 06/05/2021 5188 Last data filed at 06/05/2021 0156 Gross per 24 hour  Intake 395.5 ml  Output --  Net 395.5 ml   There were no vitals filed for this visit.   Data Reviewed:   CBC: Recent Labs  Lab 06/04/21 1231 06/04/21 1445 06/04/21 2256 06/05/21 0645  WBC 13.3*  --   --   --   NEUTROABS 12.0*  --   --   --   HGB 8.5* 7.7* 9.2* 8.9*  HCT 25.5*  --   --   --   MCV 97.7  --   --   --   PLT 409*  --   --   --    Basic Metabolic Panel: Recent Labs  Lab 06/04/21 1231  NA 134*  K 4.5  CL 102  CO2 22  GLUCOSE 255*  BUN 42*  CREATININE 1.04  CALCIUM 8.9   GFR: Estimated Creatinine Clearance: 64.6 mL/min (by C-G formula based on SCr of 1.04 mg/dL). Liver Function Tests: Recent Labs  Lab 06/04/21 1231  AST 17  ALT 20  ALKPHOS 84  BILITOT 0.7  PROT 6.3*  ALBUMIN 3.5   Recent Labs  Lab 06/04/21 1231  LIPASE 49   No results for input(s): AMMONIA in the last 168 hours. Coagulation Profile: Recent Labs  Lab 06/04/21 1231  INR 1.0   Cardiac Enzymes: No results for input(s): CKTOTAL, CKMB, CKMBINDEX, TROPONINI in the last 168  hours. BNP (last 3 results) No results for input(s): PROBNP in the last 8760 hours. HbA1C: No results for input(s): HGBA1C in the last 72 hours. CBG: No results for input(s): GLUCAP in the last 168 hours. Lipid Profile: No results for input(s): CHOL, HDL, LDLCALC, TRIG, CHOLHDL, LDLDIRECT in the last 72 hours. Thyroid Function Tests: No results for input(s): TSH, T4TOTAL, FREET4, T3FREE, THYROIDAB in the last 72 hours. Anemia Panel: No results for input(s): VITAMINB12, FOLATE, FERRITIN, TIBC, IRON, RETICCTPCT in the last 72 hours. Sepsis Labs: No results for input(s): PROCALCITON, LATICACIDVEN in the last 168 hours.  Recent Results (from the past 240 hour(s))  Resp Panel by RT-PCR (Flu A&B, Covid) Nasopharyngeal Swab     Status: None   Collection Time: 06/04/21 12:22 PM   Specimen: Nasopharyngeal Swab; Nasopharyngeal(NP) swabs in vial transport medium  Result Value Ref Range Status   SARS Coronavirus 2 by RT PCR NEGATIVE NEGATIVE Final    Comment: (NOTE) SARS-CoV-2 target nucleic acids are NOT DETECTED.  The SARS-CoV-2 RNA is generally detectable in upper respiratory specimens during the acute phase of infection. The lowest concentration of SARS-CoV-2 viral copies this assay can detect is 138 copies/mL. A negative result does not preclude SARS-Cov-2 infection and should not be used as the sole basis for treatment or other patient management decisions. A negative result may occur with  improper specimen collection/handling, submission of specimen other than nasopharyngeal swab, presence of viral mutation(s) within the areas targeted by this assay, and inadequate number of viral copies(<138 copies/mL). A negative result must be combined with clinical observations, patient history, and epidemiological information. The expected result is Negative.  Fact Sheet for Patients:  EntrepreneurPulse.com.au  Fact Sheet for Healthcare Providers:   IncredibleEmployment.be  This test is no t yet approved or cleared by the [place] FDA and  has been authorized for detection and/or diagnosis of SARS-CoV-2 by FDA under an Emergency Use Authorization (EUA). This EUA will remain  in effect (meaning this test can be used) for the duration of the COVID-19 declaration under Section 564(b)(1) of the Act, 21 U.S.C.section 360bbb-3(b)(1), unless the authorization is terminated  or revoked sooner.       Influenza A by PCR NEGATIVE NEGATIVE Final   Influenza B by PCR NEGATIVE NEGATIVE Final    Comment: (NOTE) The Xpert Xpress SARS-CoV-2/FLU/RSV plus assay is intended as an aid in the diagnosis of influenza from Nasopharyngeal swab specimens and should not be used as a sole basis for treatment. Nasal washings and aspirates are unacceptable for Xpert Xpress SARS-CoV-2/FLU/RSV testing.  Fact Sheet for Patients: EntrepreneurPulse.com.au  Fact Sheet for Healthcare Providers: IncredibleEmployment.be  This test is not yet approved or cleared by the [place] FDA and has been authorized for detection and/or diagnosis of SARS-CoV-2 by FDA under an Emergency Use Authorization (EUA). This EUA will remain in effect (meaning this test can be used) for the  duration of the COVID-19 declaration under Section 564(b)(1) of the Act, 21 U.S.C. section 360bbb-3(b)(1), unless the authorization is terminated or revoked.  Performed at [facility], [place] [address], [place], [place] [address]          Radiology Studies: No results found.      Scheduled Meds:  pantoprazole (PROTONIX) IV  40 mg Intravenous Q12H   Continuous Infusions:   LOS: 1 day   Time spent= 35 mins    Rima Blizzard Arsenio Loader, MD [facility]  If 7PM-7AM, please contact night-coverage  06/05/2021, 8:28 AM

## 2021-06-05 NOTE — Transfer of Care (Signed)
Immediate Anesthesia Transfer of Care Note  Patient: [name] [name]  Procedure(s) Performed: ESOPHAGOGASTRODUODENOSCOPY (EGD) WITH PROPOFOL BIOPSY  Patient Location: PACU  Anesthesia Type:MAC  Level of Consciousness: awake, alert , oriented and patient cooperative  Airway & Oxygen Therapy: Patient Spontanous Breathing and Patient connected to face mask oxygen  Post-op Assessment: Report given to RN and Post -op Vital signs reviewed and stable  Post vital signs: Reviewed and stable  Last Vitals:  Vitals Value Taken Time  BP 133/75 06/05/21 1133  Temp 36.6 C 06/05/21 1113  Pulse 82 06/05/21 1238  Resp 14 06/05/21 1139  SpO2 100 % 06/05/21 1238  Vitals shown include unvalidated device data.  Last Pain:  Vitals:   06/05/21 1133  TempSrc:   PainSc: 0-No pain         Complications: No notable events documented.

## 2021-06-05 NOTE — Anesthesia Postprocedure Evaluation (Signed)
Anesthesia Post Note  Patient: [name] [name]  Procedure(s) Performed: ESOPHAGOGASTRODUODENOSCOPY (EGD) WITH PROPOFOL BIOPSY     Patient location during evaluation: Endoscopy Anesthesia Type: MAC Level of consciousness: awake Pain management: pain level controlled Respiratory status: spontaneous breathing Cardiovascular status: stable Postop Assessment: no apparent nausea or vomiting Anesthetic complications: no   No notable events documented.  Last Vitals:  Vitals:   06/05/21 0800 06/05/21 1022  BP: 125/83 (!) 143/77  Pulse: 78 80  Resp: 17 14  Temp:    SpO2: 100% 100%    Last Pain:  Vitals:   06/05/21 1022  TempSrc:   PainSc: 0-No pain                 Novalie Leamy

## 2021-06-06 LAB — CLOTEST (H. PYLORI), BIOPSY: Helicobacter screen: NEGATIVE

## 2021-06-07 ENCOUNTER — Ambulatory Visit: Payer: Medicare Other | Admitting: Family Medicine

## 2021-06-07 LAB — TYPE AND SCREEN
ABO/RH(D): O POS
Antibody Screen: NEGATIVE
Unit division: 0

## 2021-06-07 LAB — BPAM RBC
Blood Product Expiration Date: 202301102359
ISSUE DATE / TIME: 202212091808
Unit Type and Rh: 5100

## 2021-06-08 ENCOUNTER — Encounter (HOSPITAL_COMMUNITY): Payer: Self-pay | Admitting: Internal Medicine

## 2021-06-08 ENCOUNTER — Telehealth: Payer: Self-pay | Admitting: Family Medicine

## 2021-06-08 ENCOUNTER — Other Ambulatory Visit (INDEPENDENT_AMBULATORY_CARE_PROVIDER_SITE_OTHER): Payer: Medicare Other

## 2021-06-08 ENCOUNTER — Other Ambulatory Visit: Payer: Self-pay

## 2021-06-08 DIAGNOSIS — K922 Gastrointestinal hemorrhage, unspecified: Secondary | ICD-10-CM

## 2021-06-08 DIAGNOSIS — M25561 Pain in right knee: Secondary | ICD-10-CM | POA: Diagnosis not present

## 2021-06-08 LAB — CBC WITH DIFFERENTIAL/PLATELET
Basophils Absolute: 0 10*3/uL (ref 0.0–0.1)
Basophils Relative: 0.4 % (ref 0.0–3.0)
Eosinophils Absolute: 0.6 10*3/uL (ref 0.0–0.7)
Eosinophils Relative: 5.1 % — ABNORMAL HIGH (ref 0.0–5.0)
HCT: 27.1 % — ABNORMAL LOW (ref 39.0–52.0)
Hemoglobin: 9 g/dL — ABNORMAL LOW (ref 13.0–17.0)
Lymphocytes Relative: 12.4 % (ref 12.0–46.0)
Lymphs Abs: 1.4 10*3/uL (ref 0.7–4.0)
MCHC: 33.3 g/dL (ref 30.0–36.0)
MCV: 97.5 fl (ref 78.0–100.0)
Monocytes Absolute: 0.8 10*3/uL (ref 0.1–1.0)
Monocytes Relative: 7.1 % (ref 3.0–12.0)
Neutro Abs: 8.4 10*3/uL — ABNORMAL HIGH (ref 1.4–7.7)
Neutrophils Relative %: 75 % (ref 43.0–77.0)
Platelets: 356 10*3/uL (ref 150.0–400.0)
RBC: 2.78 Mil/uL — ABNORMAL LOW (ref 4.22–5.81)
RDW: 14.2 % (ref 11.5–15.5)
WBC: 11.2 10*3/uL — ABNORMAL HIGH (ref 4.0–10.5)

## 2021-06-09 ENCOUNTER — Other Ambulatory Visit: Payer: Self-pay

## 2021-06-09 DIAGNOSIS — D62 Acute posthemorrhagic anemia: Secondary | ICD-10-CM

## 2021-06-10 DIAGNOSIS — M25561 Pain in right knee: Secondary | ICD-10-CM | POA: Diagnosis not present

## 2021-06-15 ENCOUNTER — Telehealth: Payer: Self-pay

## 2021-06-15 ENCOUNTER — Telehealth: Payer: Self-pay | Admitting: Internal Medicine

## 2021-06-15 DIAGNOSIS — M25561 Pain in right knee: Secondary | ICD-10-CM | POA: Diagnosis not present

## 2021-06-15 NOTE — Telephone Encounter (Signed)
As long as he is not having any further signs of bleeding with dark black stools or blood in the stool that is visible resume Plavix at dose and frequency of administration that he was on prior to hospitalization.  If he is having those problems let me know.

## 2021-06-15 NOTE — Telephone Encounter (Signed)
Patient is requesting a call back in regard to denial of plavix refill.  States Dr. Carlean Purl took him off of Plavix during a procedure and has not approved for him to go back onto Plavix. States he is going to follow back up with Dr. Celesta Aver office as well.

## 2021-06-15 NOTE — Telephone Encounter (Signed)
Pt notified of Dr. Carlean Purl Recommendations. Pt stated that he has NOT had any signs of bleeding with dark stools or Blood in stools. Pt notified to  resume Plavix at dose and frequency of administration that he was on prior to hospitalization. Pt verbalized understanding with all questions answered.

## 2021-06-15 NOTE — Telephone Encounter (Signed)
Please see note below and advise  

## 2021-06-15 NOTE — Telephone Encounter (Signed)
Patient called stating that he was never told when he needed to resume his Plavix. Patient stopped taking due to having his Endoscopy on 12/10. Seeking advice, Please advise.

## 2021-06-16 MED ORDER — CLOPIDOGREL BISULFATE 75 MG PO TABS: 75.0000 mg | ORAL_TABLET | Freq: Every day | ORAL | 1 refills | Status: DC

## 2021-06-16 NOTE — Telephone Encounter (Signed)
Please refill Plavix via [facility] and contact patient to let him know that is has been approved.  See Dr.Gessner's note to continue the medication.

## 2021-06-16 NOTE — Telephone Encounter (Signed)
See other message

## 2021-06-16 NOTE — Addendum Note (Signed)
Addended by: Marian Sorrow on: 06/16/2021 11:55 AM   Modules accepted: Orders

## 2021-06-16 NOTE — Telephone Encounter (Signed)
Spoke to pt told him Rx for Plavix 75 mg was sent to [facility] order. Pt verbalized understanding.

## 2021-06-18 DIAGNOSIS — M25561 Pain in right knee: Secondary | ICD-10-CM | POA: Diagnosis not present

## 2021-06-23 DIAGNOSIS — Z471 Aftercare following joint replacement surgery: Secondary | ICD-10-CM | POA: Diagnosis not present

## 2021-06-23 DIAGNOSIS — Z4789 Encounter for other orthopedic aftercare: Secondary | ICD-10-CM | POA: Diagnosis not present

## 2021-06-23 DIAGNOSIS — Z96651 Presence of right artificial knee joint: Secondary | ICD-10-CM | POA: Diagnosis not present

## 2021-06-23 DIAGNOSIS — M25561 Pain in right knee: Secondary | ICD-10-CM | POA: Diagnosis not present

## 2021-06-25 DIAGNOSIS — M25561 Pain in right knee: Secondary | ICD-10-CM | POA: Diagnosis not present

## 2021-06-29 ENCOUNTER — Other Ambulatory Visit (INDEPENDENT_AMBULATORY_CARE_PROVIDER_SITE_OTHER): Payer: Medicare Other

## 2021-06-29 ENCOUNTER — Other Ambulatory Visit: Payer: Self-pay

## 2021-06-29 DIAGNOSIS — D62 Acute posthemorrhagic anemia: Secondary | ICD-10-CM | POA: Diagnosis not present

## 2021-06-29 DIAGNOSIS — M25561 Pain in right knee: Secondary | ICD-10-CM | POA: Diagnosis not present

## 2021-06-29 LAB — CBC WITH DIFFERENTIAL/PLATELET
Basophils Absolute: 0 10*3/uL (ref 0.0–0.1)
Basophils Relative: 0.6 % (ref 0.0–3.0)
Eosinophils Absolute: 0.3 10*3/uL (ref 0.0–0.7)
Eosinophils Relative: 4.8 % (ref 0.0–5.0)
HCT: 34.7 % — ABNORMAL LOW (ref 39.0–52.0)
Hemoglobin: 11.2 g/dL — ABNORMAL LOW (ref 13.0–17.0)
Lymphocytes Relative: 17.9 % (ref 12.0–46.0)
Lymphs Abs: 1.3 10*3/uL (ref 0.7–4.0)
MCHC: 32.3 g/dL (ref 30.0–36.0)
MCV: 94.6 fl (ref 78.0–100.0)
Monocytes Absolute: 0.6 10*3/uL (ref 0.1–1.0)
Monocytes Relative: 7.8 % (ref 3.0–12.0)
Neutro Abs: 5 10*3/uL (ref 1.4–7.7)
Neutrophils Relative %: 68.9 % (ref 43.0–77.0)
Platelets: 331 10*3/uL (ref 150.0–400.0)
RBC: 3.66 Mil/uL — ABNORMAL LOW (ref 4.22–5.81)
RDW: 14.5 % (ref 11.5–15.5)
WBC: 7.2 10*3/uL (ref 4.0–10.5)

## 2021-06-29 LAB — FERRITIN: Ferritin: 28.6 ng/mL (ref 22.0–322.0)

## 2021-06-29 MED ORDER — BLOOD GLUCOSE METER KIT: PACK | 0 refills | Status: DC

## 2021-06-30 ENCOUNTER — Other Ambulatory Visit: Payer: Self-pay

## 2021-06-30 MED ORDER — BLOOD GLUCOSE METER KIT: PACK | 0 refills | Status: DC

## 2021-07-01 ENCOUNTER — Telehealth: Payer: Self-pay | Admitting: Pharmacist

## 2021-07-01 ENCOUNTER — Encounter: Payer: Self-pay | Admitting: Gastroenterology

## 2021-07-01 ENCOUNTER — Telehealth: Payer: Self-pay | Admitting: *Deleted

## 2021-07-01 ENCOUNTER — Ambulatory Visit (INDEPENDENT_AMBULATORY_CARE_PROVIDER_SITE_OTHER): Payer: Medicare Other | Admitting: Gastroenterology

## 2021-07-01 VITALS — BP 122/77 | HR 68 | Ht 68.0 in | Wt 174.6 lb

## 2021-07-01 DIAGNOSIS — Z1211 Encounter for screening for malignant neoplasm of colon: Secondary | ICD-10-CM | POA: Diagnosis not present

## 2021-07-01 DIAGNOSIS — K269 Duodenal ulcer, unspecified as acute or chronic, without hemorrhage or perforation: Secondary | ICD-10-CM | POA: Diagnosis not present

## 2021-07-01 DIAGNOSIS — Z7902 Long term (current) use of antithrombotics/antiplatelets: Secondary | ICD-10-CM | POA: Diagnosis not present

## 2021-07-01 NOTE — Progress Notes (Signed)
07/01/2021 [name] [name] [id number] 1951-05-04   HISTORY OF PRESENT ILLNESS: This is a 71 year old male who is here for hospital follow-up.  Was hospitalized in December for upper GI bleed.  Underwent EGD on 12/10 that showed the following:  - Non-bleeding duodenal ulcer with a clean ulcer base (Forrest Class III). - one tiny red spot only - Non-bleeding duodenal diverticulum. - The examination was otherwise normal.  H. pylori negative.  He was not using NSAIDs.  Is currently on pantoprazole 40 mg twice daily.  Was not on PPI prior to this.  Previously he only used Pepcid as needed for reflux.  Hemoglobin 2 days ago shows improvement to 11.2 g from 9.0 g on 06/08/2021.  Patient states that he feels well.  Stools have returned to normal.  Last colonoscopy was 03/2011 at which time he had mild diverticulosis of the sigmoid colon with no polyps.   Past Medical History:  Diagnosis Date   Adenomatous colon polyp 09/07/2004   Amblyopia    CAD (coronary artery disease)    cardiac CT with a 50% LAD lesion in 2007   Diverticulosis    DM2 (diabetes mellitus, type 2) (HCC)    Elevated LFTs    secondary to labetalol   Esophageal stricture 2003   External hemorrhoids 2012   lanced by Dr Rise Patience   Family history of anesthesia complication    "sister had PONV"   GERD (gastroesophageal reflux disease)    esophageal stricture, pmh   Hiatal hernia 01/04/2000   History of kidney stones 2022   HLD (hyperlipidemia)    HTN (hypertension)    Hypopotassemia    Past Surgical History:  Procedure Laterality Date   BIOPSY  06/05/2021   Procedure: BIOPSY;  Surgeon: Gatha Mayer, MD;  Location: [facility];  Service: Endoscopy;;   COLONOSCOPY  06/27/2004   tics , ADENOMATOUS polyps. F/U due 2011   COLONOSCOPY  06/27/2010   Tics   CORONARY ANGIOPLASTY WITH STENT PLACEMENT  04/01/2014   "2"   ESOPHAGEAL DILATION  06/27/2001   ESOPHAGOGASTRODUODENOSCOPY  06/27/2001   ERD,  stricture dilated   ESOPHAGOGASTRODUODENOSCOPY (EGD) WITH PROPOFOL N/A 06/05/2021   Procedure: ESOPHAGOGASTRODUODENOSCOPY (EGD) WITH PROPOFOL;  Surgeon: Gatha Mayer, MD;  Location: [facility];  Service: Endoscopy;  Laterality: N/A;   EXCISIONAL HEMORRHOIDECTOMY  ~ 2008   LEFT HEART CATHETERIZATION WITH CORONARY ANGIOGRAM N/A 03/26/2014   Procedure: LEFT HEART CATHETERIZATION WITH CORONARY ANGIOGRAM;  Surgeon: Peter M [facility], MD;  Location: [facility] CATH LAB;  Service: Cardiovascular;  Laterality: N/A;   PERCUTANEOUS CORONARY STENT INTERVENTION (PCI-S) N/A 04/01/2014   Procedure: PERCUTANEOUS CORONARY STENT INTERVENTION (PCI-S);  Surgeon: Peter M [facility], MD;  Location: [facility] CATH LAB;  Service: Cardiovascular;  Laterality: N/A;   POSTERIOR LAMINECTOMY / DECOMPRESSION LUMBAR SPINE  12/25/2009   L4 , Dr Sherwood Gambler   SHOULDER ARTHROSCOPY WITH ROTATOR CUFF REPAIR AND OPEN BICEPS TENODESIS Left 02/04/2019   Procedure: LEFT SHOULDER ARTHROSCOPY, BICEPS TENODESIS, MINI OPEN ROTATOR CUFF TEAR REPAIR;  Surgeon: Meredith Pel, MD;  Location: [facility];  Service: Orthopedics;  Laterality: Left;   TOTAL KNEE ARTHROPLASTY Right 05/11/2021   Procedure: TOTAL KNEE ARTHROPLASTY;  Surgeon: Paralee Cancel, MD;  Location: [facility];  Service: Orthopedics;  Laterality: Right;    reports that he has never smoked. He has never used smokeless tobacco. He reports current alcohol use of about 2.0 standard drinks per week. He reports that he does not use drugs. family history  includes Breast cancer in his maternal aunt; Diabetes in his brother, father, and paternal grandmother; Heart attack in his brother; Heart attack (age of onset: 50) in his maternal grandfather; Heart attack (age of onset: 102) in his mother; Heart disease in his sister; Heart disease (age of onset: 82) in his father; Prostate cancer in his father; Stroke (age of onset: 44) in his mother; Stroke (age of onset: 87) in his paternal  grandfather. 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   clopidogrel (PLAVIX) 75 MG tablet Take 1 tablet (75 mg total) by mouth daily.   Coenzyme Q10 200 MG capsule Take 200 mg by mouth daily.   docusate sodium (COLACE) 100 MG capsule Take 1 capsule (100 mg total) by mouth 2 (two) times daily.   famotidine-calcium carbonate-magnesium hydroxide (PEPCID COMPLETE) 10-800-165 MG chewable tablet Chew 0.5 tablets by mouth every 6 (six) hours as needed (heartburn).   Homeopathic Products (ZICAM ALLERGY RELIEF NA) Place 1 spray into the nose daily as needed (colds).   HYDROcodone-acetaminophen (NORCO) 7.5-325 MG tablet Take 1-2 tablets by mouth every 4 (four) hours as needed for severe pain (pain score 7-10). (Patient taking differently: Take 1 tablet by mouth in the morning and at bedtime.)   irbesartan (AVAPRO) 300 MG tablet TAKE 1 TABLET DAILY.   metFORMIN (GLUCOPHAGE-XR) 500 MG 24 hr tablet Take 2 tablet (1000Mg total) by mouth daily after supper (Patient taking differently: 1,000 mg at bedtime.)   methocarbamol (ROBAXIN) 500 MG tablet Take 1 tablet (500 mg total) by mouth every 6 (six) hours as needed for muscle spasms. (Patient taking differently: Take 500 mg by mouth in the morning and at bedtime.)   Multiple Vitamins-Minerals (MULTIVITAMIN WITH MINERALS) tablet Take 1 tablet by mouth daily.   nitroGLYCERIN (NITROSTAT) 0.4 MG SL tablet Place 1 tablet (0.4 mg  total) under the tongue every 5 (five) minutes as needed for chest pain.   pantoprazole (PROTONIX) 40 MG tablet Take 1 tablet (40 mg total) by mouth 2 (two) times daily before a meal.   polyethylene glycol (MIRALAX / GLYCOLAX) 17 g packet Take 17 g by mouth daily as needed for mild constipation.   sildenafil (REVATIO) 20 MG tablet Take 1-5 tablets by mouth daily as needed.   sodium chloride (OCEAN) 0.65 % SOLN nasal spray Place 1 spray into both nostrils as needed for congestion.   spironolactone (ALDACTONE) 25 MG tablet TAKE 1 TABLET DAILY   Undecylenic Ac-Zn Undecylenate (FUNGI-NAIL TOE & FOOT EX) Apply 1 application topically See admin instructions. Apply to bilateral big toes every other day   vitamin B-12 (CYANOCOBALAMIN) 1000 MCG tablet Take 1,000 mcg by mouth daily.   [DISCONTINUED] blood glucose meter kit and supplies Dispense based on patient and insurance preference. Use up to four times daily as directed. (FOR ICD-10 E10.9, E11.9).   [DISCONTINUED] blood glucose meter kit and supplies Dispense based on patient and insurance  preference. Use up to four times daily as directed. (FOR ICD-10 E10.9, E11.9).   [DISCONTINUED] glucose blood (ONE TOUCH ULTRA TEST) test strip Use one strip per test. Test blood sugars 1-4 times daily as instructed.   [DISCONTINUED] PROTEIN PO Take 1 Container by mouth 2 (two) times a week. (Patient not taking: Reported on 06/04/2021)   No facility-administered encounter medications on file as of 07/01/2021.     REVIEW OF SYSTEMS  : All other systems reviewed and negative except where noted in the History of Present Illness.   PHYSICAL EXAM: BP 122/77    Pulse 68    Ht '5\' 8"'  (1.727 m)    Wt 174 lb 9.6 oz (79.2 kg)    SpO2 97%    BMI 26.55 kg/m  General: Well developed white male in no acute distress Head: Normocephalic and atraumatic Eyes:  Sclerae anicteric, conjunctiva pink. Ears: Normal auditory acuity Lungs: Clear throughout to auscultation; no  W/R/R. Heart: Regular rate and rhythm; no M/R/G. Abdomen: Soft, non-distended.  BS present.  Non-tender. Rectal:  Will be done at the time of colonoscopy. Musculoskeletal: Symmetrical with no gross deformities  Skin: No lesions on visible extremities Extremities: No edema  Neurological: Alert oriented x 4, grossly non-focal Psychological:  Alert and cooperative. Normal mood and affect  ASSESSMENT AND PLAN: *Upper GI bleed due to duodenal ulcer seen on EGD.  H. pylori was negative and he was not on NSAIDs.  We will plan for pantoprazole 40 mg twice daily for total of 8 weeks and then decrease to once daily, possibly indefinitely.  Prescription sent to pharmacy.  Hemoglobin improved and patient symptoms improved/resolved. *CRC screening:  Last colonoscopy 2012.  Will plan for colonoscopy with Dr. Carlean Purl.  The risks, benefits, and alternatives to colonoscopy were discussed with the patient and he consents to proceed.  *Chronic antiplatelet use for history of coronary artery disease, on Plavix:  Hold Plavix for 5 days before procedure - will instruct when and how to resume after procedure. Risks and benefits of procedure including bleeding, perforation, infection, missed lesions, medication reactions and possible hospitalization or surgery if complications occur explained. Additional rare but real risk of cardiovascular event such as heart attack or ischemia/infarct of other organs off Plavix explained and need to seek urgent help if this occurs. Will communicate by phone or EMR with patient's prescribing provider, Dr. [facility], to confirm that holding Plavix is reasonable in this case.    CC:  Vivi Barrack, MD

## 2021-07-01 NOTE — Telephone Encounter (Signed)
° °  Name: [name] [name]  DOB: 23-Feb-1951  MRN: [id number]   Primary Cardiologist: [name], MD  Chart reviewed as part of pre-operative protocol coverage. Patient was contacted 07/01/2021 in reference to pre-operative risk assessment for pending surgery as outlined below.  [name] [name] was last seen 08/2020 by Dr. [facility]. History reviewed with known CAD s/p prior PCI last in 2015. Had stress test 08/2020 which Dr. [facility] felt was stable and low risk. Patient was cleared for unrelated surgery 02/2021 at which time he was granted clearance by MD to hold his Plavix. Given no interim clinical changes in cardiac history, the same clearance would apply here. OK to hold Plavix for 5 days prior to colonoscopy. I reached out to patient for update on how he is doing. The patient affirms he has been doing well without any new cardiac symptoms. He did have ER visits in the fall related to weakness and hypotension but was found to be anemic and heme positive at that time. He has since seen GI with plan for colonoscopy as below. Last Hgb improving to 11.2 and he states BP have normalized as well. No CP, SOB, syncope, palpitations. Therefore, based on ACC/AHA guidelines, the patient would be at acceptable risk for the planned procedure without further cardiovascular testing. The patient was advised that if he develops new symptoms prior to surgery to contact our office to arrange for a follow-up visit, and he verbalized understanding.  I will route this recommendation to the requesting party via Epic fax function and remove from pre-op pool. Please call with questions.  [name], PA-C 07/01/2021, 3:03 PM

## 2021-07-01 NOTE — Telephone Encounter (Signed)
[facility] Pre-operative Risk Assessment     Request for surgical clearance:     Endoscopy Procedure  What type of surgery is being performed?     Colonoscopy  When is this surgery scheduled?     Tues 07/13/21  What type of clearance is required ?   Pharmacy  Are there any medications that need to be held prior to surgery and how long? Plavix 5 days  Practice name and name of physician performing surgery?      [facility]  What is your office phone and fax number?      Phone- [phone]  [phone]  Anesthesia type (None, local, MAC, general) ?       MAC

## 2021-07-01 NOTE — Chronic Care Management (AMB) (Signed)
Chronic Care Management Pharmacy Assistant   Name: [name] [name]  MRN: [id number] DOB: 1950/12/26   Reason for Encounter: Diabetes Adherence Call    Recent office visits:  None  Recent consult visits:  12/29/2021 OV Gertie Fey) Zehr, Laban Emperor, PA-C; start pantoprazole  Hospital visits:  06/04/2021 ED Visit for Generalized Weakness, Tachycardia, Lightheadedness and Dark stools  -No medication changes indicated  Medications: Outpatient Encounter Medications as of 07/01/2021  Medication Sig Note   atorvastatin (LIPITOR) 80 MG tablet TAKE 1 TABLET DAILY AT     6:00PM    carvedilol (COREG) 3.125 MG tablet TAKE 1 TABLET TWICE DAILY  WITH MEALS.    clopidogrel (PLAVIX) 75 MG tablet Take 1 tablet (75 mg total) by mouth daily.    Coenzyme Q10 200 MG capsule Take 200 mg by mouth daily.    docusate sodium (COLACE) 100 MG capsule Take 1 capsule (100 mg total) by mouth 2 (two) times daily.    famotidine-calcium carbonate-magnesium hydroxide (PEPCID COMPLETE) 10-800-165 MG chewable tablet Chew 0.5 tablets by mouth every 6 (six) hours as needed (heartburn).    Homeopathic Products (ZICAM ALLERGY RELIEF NA) Place 1 spray into the nose daily as needed (colds).    HYDROcodone-acetaminophen (NORCO) 7.5-325 MG tablet Take 1-2 tablets by mouth every 4 (four) hours as needed for severe pain (pain score 7-10). (Patient taking differently: Take 1 tablet by mouth in the morning and at bedtime.)    irbesartan (AVAPRO) 300 MG tablet TAKE 1 TABLET DAILY.    metFORMIN (GLUCOPHAGE-XR) 500 MG 24 hr tablet Take 2 tablet (1000Mg  total) by mouth daily after supper (Patient taking differently: 1,000 mg at bedtime.) 06/04/2021: The patient stated he accidentally took this yesterday AM after he had taken it (also) the night before   methocarbamol (ROBAXIN) 500 MG tablet Take 1 tablet (500 mg total) by mouth every 6 (six) hours as needed for muscle spasms. (Patient taking differently: Take 500 mg by mouth in the morning  and at bedtime.)    Multiple Vitamins-Minerals (MULTIVITAMIN WITH MINERALS) tablet Take 1 tablet by mouth daily.    nitroGLYCERIN (NITROSTAT) 0.4 MG SL tablet Place 1 tablet (0.4 mg total) under the tongue every 5 (five) minutes as needed for chest pain. 06/04/2021: Patient would appreciate a new Rx- his medication has EXPIRED   pantoprazole (PROTONIX) 40 MG tablet Take 1 tablet (40 mg total) by mouth 2 (two) times daily before a meal.    polyethylene glycol (MIRALAX / GLYCOLAX) 17 g packet Take 17 g by mouth daily as needed for mild constipation.    sildenafil (REVATIO) 20 MG tablet Take 1-5 tablets by mouth daily as needed.    sodium chloride (OCEAN) 0.65 % SOLN nasal spray Place 1 spray into both nostrils as needed for congestion.    spironolactone (ALDACTONE) 25 MG tablet TAKE 1 TABLET DAILY    Undecylenic Ac-Zn Undecylenate (FUNGI-NAIL TOE & FOOT EX) Apply 1 application topically See admin instructions. Apply to bilateral big toes every other day    vitamin B-12 (CYANOCOBALAMIN) 1000 MCG tablet Take 1,000 mcg by mouth daily.    No facility-administered encounter medications on file as of 07/01/2021.   Recent Relevant Labs: Lab Results  Component Value Date/Time   HGBA1C 7.3 (H) 04/30/2021 11:04 AM   HGBA1C 7.0 (A) 03/08/2021 10:19 AM   HGBA1C 7.4 (H) 09/02/2020 10:10 AM   MICROALBUR 0.7 01/17/2017 10:59 AM   MICROALBUR 0.8 03/19/2014 08:51 AM    Kidney Function Lab Results  Component Value Date/Time   CREATININE 1.04 06/04/2021 12:31 PM   CREATININE 1.10 05/25/2021 11:25 AM   CREATININE 0.96 03/05/2020 10:09 AM   CREATININE 1.00 07/09/2015 09:57 AM   GFR 78.07 09/02/2020 10:10 AM   GFRNONAA >60 06/04/2021 12:31 PM   GFRAA >60 10/21/2019 01:11 AM    Current antihyperglycemic regimen:  Metformin 500 mg twice daily  What recent interventions/DTPs have been made to improve glycemic control:  No recent interventions or DTPs.  Have there been any recent hospitalizations or ED  visits since last visit with [facility]? Yes  Patient denies hypoglycemic symptoms. Although patient states he has been feeling weak due to low hemoglobin and ferritin levels. He states he can tell that his generalized weakness has improved some.  Patient denies hyperglycemic symptoms.  How often are you checking your blood sugar? Patient is not currently checking blood sugars as he does not have a meter. I sent a message to [name] requesting a new prescription for glucometer and supplies sent to [facility] in [place].  What are your blood sugars ranging?  Patient is not currently monitoring his blood sugars.  During the week, how often does your blood glucose drop below 70? unknown  Are you checking your feet daily/regularly? Yes, patient states he checks his feet daily.  Adherence Review: Is the patient currently on a STATIN medication? Yes Is the patient currently on ACE/ARB medication? Yes Does the patient have >5 day gap between last estimated fill dates? No  Care Gaps: Medicare Annual Wellness: Completed 03/18/2021 Ophthalmology Exam: Next due on 04/28/2022 Hemoglobin A1C: 7.3% on 04/30/2021 Colonoscopy: Overdue since 04/24/2016  Future Appointments  Date Time Provider [place]  07/13/2021 11:00 AM Gatha Mayer, MD [facility]  09/08/2021  9:20 AM Vivi Barrack, MD [facility]  10/04/2021  3:00 PM [facility] PHARMACIST [facility]   Star Rating Drugs: Atorvastatin 80 mg last filled 06/14/2021 90 DS Irbesartan 300 mg last filled 06/14/2021 90 DS Metformin 500 mg last filled 04/12/2021 90 DS  [name], [place] Pharmacist Assistant [phone]

## 2021-07-01 NOTE — Patient Instructions (Signed)
We have sent the following medications to your pharmacy for you to pick up at your convenience: Pantoprazole 40 mg twice daily 30-60 minutes before breakfast and dinner.  You have been scheduled for a colonoscopy. Please follow written instructions given to you at your visit today.  Please pick up your prep supplies at the pharmacy within the next 1-3 days. If you use inhalers (even only as needed), please bring them with you on the day of your procedure.  If you are age 79 or older, your body mass index should be between 23-30. Your Body mass index is 26.55 kg/m. If this is out of the aforementioned range listed, please consider follow up with your Primary Care Provider.  If you are age 76 or younger, your body mass index should be between 19-25. Your Body mass index is 26.55 kg/m. If this is out of the aformentioned range listed, please consider follow up with your Primary Care Provider.   ________________________________________________________  The [facility] providers would like to encourage you to use [facility] to communicate with providers for non-urgent requests or questions.  Due to long hold times on the telephone, sending your provider a message by [facility] may be a faster and more efficient way to get a response.  Please allow 48 business hours for a response.  Please remember that this is for non-urgent requests.  _______________________________________________________

## 2021-07-02 DIAGNOSIS — M25561 Pain in right knee: Secondary | ICD-10-CM | POA: Diagnosis not present

## 2021-07-05 ENCOUNTER — Other Ambulatory Visit: Payer: Self-pay | Admitting: *Deleted

## 2021-07-05 DIAGNOSIS — M25561 Pain in right knee: Secondary | ICD-10-CM | POA: Diagnosis not present

## 2021-07-05 MED ORDER — GLUCOSE BLOOD VI STRP: ORAL_STRIP | 12 refills | Status: DC

## 2021-07-05 MED ORDER — ACCU-CHEK AVIVA PLUS W/DEVICE KIT: PACK | 0 refills | Status: AC

## 2021-07-05 MED ORDER — ACCU-CHEK SOFTCLIX LANCETS MISC: 1.0000 | Freq: Three times a day (TID) | 11 refills | Status: DC

## 2021-07-06 ENCOUNTER — Other Ambulatory Visit: Payer: Self-pay | Admitting: Family Medicine

## 2021-07-07 ENCOUNTER — Other Ambulatory Visit: Payer: Self-pay | Admitting: *Deleted

## 2021-07-07 DIAGNOSIS — M25561 Pain in right knee: Secondary | ICD-10-CM | POA: Diagnosis not present

## 2021-07-07 MED ORDER — PANTOPRAZOLE SODIUM 40 MG PO TBEC: 40.0000 mg | DELAYED_RELEASE_TABLET | Freq: Two times a day (BID) | ORAL | 0 refills | Status: DC

## 2021-07-07 MED ORDER — PANTOPRAZOLE SODIUM 40 MG PO TBEC
40.0000 mg | DELAYED_RELEASE_TABLET | Freq: Two times a day (BID) | ORAL | 1 refills | Status: DC
Start: 2021-07-07 — End: 2021-08-26

## 2021-07-07 NOTE — Telephone Encounter (Signed)
Spoke with patient and confirmed patient to hold Plavix starting 07/08/21. Patient voiced understanding.

## 2021-07-09 DIAGNOSIS — M25561 Pain in right knee: Secondary | ICD-10-CM | POA: Diagnosis not present

## 2021-07-13 ENCOUNTER — Encounter: Payer: Self-pay | Admitting: Internal Medicine

## 2021-07-13 ENCOUNTER — Ambulatory Visit (AMBULATORY_SURGERY_CENTER): Payer: Medicare Other | Admitting: Internal Medicine

## 2021-07-13 VITALS — BP 147/84 | HR 57 | Temp 97.8°F | Resp 20 | Ht 68.0 in | Wt 174.0 lb

## 2021-07-13 DIAGNOSIS — D122 Benign neoplasm of ascending colon: Secondary | ICD-10-CM

## 2021-07-13 DIAGNOSIS — Z86010 Personal history of colonic polyps: Secondary | ICD-10-CM | POA: Diagnosis not present

## 2021-07-13 MED ORDER — SODIUM CHLORIDE 0.9 % IV SOLN: 500.0000 mL | Freq: Once | INTRAVENOUS | Status: DC

## 2021-07-13 NOTE — Progress Notes (Signed)
Called to room to assist during endoscopic procedure.  Patient ID and intended procedure confirmed with present staff. Received instructions for my participation in the procedure from the performing physician.  

## 2021-07-13 NOTE — Progress Notes (Signed)
History and Physical Interval Note:  07/13/2021 11:31 AM  [name] [name]  has presented today for endoscopic procedure(s), with the diagnosis of  Encounter Diagnosis  Name Primary?   Hx of adenomatous polyp of colon Yes  .  The various methods of evaluation and treatment have been discussed with the patient and/or family. After consideration of risks, benefits and other options for treatment, the patient has consented to  the endoscopic procedure(s).  Hx single adenoam 2006, no polyps 2012  The patient's history has been reviewed, patient examined, no change in status, stable for endoscopic procedure(s).  I have reviewed the patient's chart and labs.  Questions were answered to the patient's satisfaction.     Gatha Mayer, MD, [facility]

## 2021-07-13 NOTE — Op Note (Signed)
[name] [name]: [name] [name] Procedure Date: 07/13/2021 11:29 AM MRN: [id number] Endoscopist: Gatha Mayer , MD Age: 71 Referring MD:  Date of Birth: 06/04/1951 Gender: Male Account #: [id number] Procedure:                Colonoscopy Indications:              Surveillance: Personal history of adenomatous                            polyps on last colonoscopy > 5 years ago, Last                            colonoscopy: 2012 Medicines:                Propofol per Anesthesia, Monitored Anesthesia Care Procedure:                Pre-Anesthesia Assessment:                           - Prior to the procedure, a History and Physical                            was performed, and patient medications and                            allergies were reviewed. The patient's tolerance of                            previous anesthesia was also reviewed. The risks                            and benefits of the procedure and the sedation                            options and risks were discussed with the patient.                            All questions were answered, and informed consent                            was obtained. Prior Anticoagulants: The patient                            last took Plavix (clopidogrel) 5 days prior to the                            procedure. ASA Grade Assessment: III - A patient                            with severe systemic disease. After reviewing the                            risks and benefits, the patient was deemed in  satisfactory condition to undergo the procedure.                           After obtaining informed consent, the colonoscope                            was passed under direct vision. Throughout the                            procedure, the patient's blood pressure, pulse, and                            oxygen saturations were monitored continuously. The                            Olympus CF-HQ190L  ([id number]) Colonoscope was                            introduced through the anus and advanced to the the                            cecum, identified by appendiceal orifice and                            ileocecal valve. The colonoscopy was performed                            without difficulty. The patient tolerated the                            procedure well. The quality of the bowel                            preparation was good. The ileocecal valve,                            appendiceal orifice, and rectum were photographed.                            The bowel preparation used was Miralax via split                            dose instruction. Scope In: 11:39:54 AM Scope Out: 11:57:12 AM Scope Withdrawal Time: 0 hours 12 minutes 40 seconds  Total Procedure Duration: 0 hours 17 minutes 18 seconds  Findings:                 The perianal and digital rectal examinations were                            normal.                           A large polyp was found in the ascending colon. The  polyp was sessile. Biopsies were taken with a cold                            forceps for histology. Verification of patient                            identification for the specimen was done. Estimated                            blood loss was minimal. Area was tattooed with an                            injection of 3 mL of Spot (carbon black).                           Multiple diverticula were found in the sigmoid                            colon.                           The exam was otherwise without abnormality on                            direct and retroflexion views. Complications:            No immediate complications. Estimated Blood Loss:     Estimated blood loss was minimal. Impression:               - One large polyp in the ascending colon. Biopsied.                            Tattooed. 1/2 circumference lesion - soft and                             benign-appearing.                           - Diverticulosis in the sigmoid colon.                           - The examination was otherwise normal on direct                            and retroflexion views. Recommendation:           - Patient has a contact number available for                            emergencies. The signs and symptoms of potential                            delayed complications were discussed with the                            patient. Return to normal activities  tomorrow.                            Written discharge instructions were provided to the                            patient.                           - Resume previous diet.                           - Continue present medications.                           - Await pathology results. Will discuss removal                            options - colonoscopy vs surgery. as long as                            biopsies not malignent think reasonable to try                            colonoscopic removal.                           - Resume Plavix (clopidogrel) at prior dose                            tomorrow. Gatha Mayer, MD 07/13/2021 12:06:02 PM This report has been signed electronically.

## 2021-07-13 NOTE — Patient Instructions (Addendum)
There is a very large polyp in the beginning of the colon.It looks benign but it is very large and that makes it more difficult to know. I took biopsies and when results are in will discuss what to do next -  how to remove.  As for the pantoprazole - take it twice a day for 2 months total and then start taking one a day.  Resume clopidogrel (Plavix) tomorrow.   I appreciate the opportunity to care for you. Gatha Mayer, MD, [facility]  Polyp and diverticulosis handouts given to patient.  YOU HAD AN ENDOSCOPIC PROCEDURE TODAY AT [facility]:   Refer to the procedure report that was given to you for any specific questions about what was found during the examination.  If the procedure report does not answer your questions, please call your gastroenterologist to clarify.  If you requested that your care partner not be given the details of your procedure findings, then the procedure report has been included in a sealed envelope for you to review at your convenience later.  YOU SHOULD EXPECT: Some feelings of bloating in the abdomen. Passage of more gas than usual.  Walking can help get rid of the air that was put into your GI tract during the procedure and reduce the bloating. If you had a lower endoscopy (such as a colonoscopy or flexible sigmoidoscopy) you may notice spotting of blood in your stool or on the toilet paper. If you underwent a bowel prep for your procedure, you may not have a normal bowel movement for a few days.  Please Note:  You might notice some irritation and congestion in your nose or some drainage.  This is from the oxygen used during your procedure.  There is no need for concern and it should clear up in a day or so.  SYMPTOMS TO REPORT IMMEDIATELY:  Following lower endoscopy (colonoscopy or flexible sigmoidoscopy):  Excessive amounts of blood in the stool  Significant tenderness or worsening of abdominal pains  Swelling of the abdomen that is new,  acute  Fever of 100F or higher For urgent or emergent issues, a gastroenterologist can be reached at any hour by calling [phone]. Do not use MyChart messaging for urgent concerns.    DIET:  We do recommend a small meal at first, but then you may proceed to your regular diet.  Drink plenty of fluids but you should avoid alcoholic beverages for 24 hours.  ACTIVITY:  You should plan to take it easy for the rest of today and you should NOT DRIVE or use heavy machinery until tomorrow (because of the sedation medicines used during the test).    FOLLOW UP: Our staff will call the number listed on your records 48-72 hours following your procedure to check on you and address any questions or concerns that you may have regarding the information given to you following your procedure. If we do not reach you, we will leave a message.  We will attempt to reach you two times.  During this call, we will ask if you have developed any symptoms of COVID 19. If you develop any symptoms (ie: fever, flu-like symptoms, shortness of breath, cough etc.) before then, please call [phone].  If you test positive for Covid 19 in the 2 weeks post procedure, please call and report this information to [place].    If any biopsies were taken you will be contacted by phone or by letter within the next 1-3 weeks.  Please  call us at [phone] if you have not heard about the biopsies in 3 weeks.    SIGNATURES/CONFIDENTIALITY: You and/or your care partner have signed paperwork which will be entered into your electronic medical record.  These signatures attest to the fact that that the information above on your After Visit Summary has been reviewed and is understood.  Full responsibility of the confidentiality of this discharge information lies with you and/or your care-partner.

## 2021-07-13 NOTE — Progress Notes (Signed)
Pt non-responsive, VVS, Report to RN  °

## 2021-07-14 DIAGNOSIS — M25561 Pain in right knee: Secondary | ICD-10-CM | POA: Diagnosis not present

## 2021-07-15 ENCOUNTER — Telehealth: Payer: Self-pay

## 2021-07-15 NOTE — Telephone Encounter (Signed)
°  Follow up Call-  Call back number 07/13/2021  Post procedure Call Back phone  # [phone]  Permission to leave phone message Yes  Some recent data might be hidden     Patient questions:  Do you have a fever, pain , or abdominal swelling? No. Pain Score  0 *  Have you tolerated food without any problems? Yes.    Have you been able to return to your normal activities? Yes.    Do you have any questions about your discharge instructions: Diet   No. Medications  No. Follow up visit  No.  Do you have questions or concerns about your Care? No.  Actions: * If pain score is 4 or above: No action needed, pain <4.

## 2021-07-16 DIAGNOSIS — M25561 Pain in right knee: Secondary | ICD-10-CM | POA: Diagnosis not present

## 2021-07-19 DIAGNOSIS — M25561 Pain in right knee: Secondary | ICD-10-CM | POA: Diagnosis not present

## 2021-07-21 ENCOUNTER — Other Ambulatory Visit: Payer: Self-pay

## 2021-07-21 DIAGNOSIS — Z86010 Personal history of colonic polyps: Secondary | ICD-10-CM

## 2021-07-21 DIAGNOSIS — M25561 Pain in right knee: Secondary | ICD-10-CM | POA: Diagnosis not present

## 2021-07-21 MED ORDER — PEG 3350-KCL-NA BICARB-NACL 420 G PO SOLR: 4000.0000 mL | Freq: Once | ORAL | 0 refills | Status: AC

## 2021-07-26 DIAGNOSIS — M25561 Pain in right knee: Secondary | ICD-10-CM | POA: Diagnosis not present

## 2021-07-28 DIAGNOSIS — M25561 Pain in right knee: Secondary | ICD-10-CM | POA: Diagnosis not present

## 2021-07-30 DIAGNOSIS — M25561 Pain in right knee: Secondary | ICD-10-CM | POA: Diagnosis not present

## 2021-08-02 DIAGNOSIS — M25561 Pain in right knee: Secondary | ICD-10-CM | POA: Diagnosis not present

## 2021-08-04 DIAGNOSIS — M25561 Pain in right knee: Secondary | ICD-10-CM | POA: Diagnosis not present

## 2021-08-06 DIAGNOSIS — M25561 Pain in right knee: Secondary | ICD-10-CM | POA: Diagnosis not present

## 2021-08-10 ENCOUNTER — Telehealth (INDEPENDENT_AMBULATORY_CARE_PROVIDER_SITE_OTHER): Payer: Medicare Other | Admitting: Family Medicine

## 2021-08-10 VITALS — Temp 100.0°F | Ht 68.0 in | Wt 174.0 lb

## 2021-08-10 DIAGNOSIS — E1165 Type 2 diabetes mellitus with hyperglycemia: Secondary | ICD-10-CM

## 2021-08-10 DIAGNOSIS — I152 Hypertension secondary to endocrine disorders: Secondary | ICD-10-CM

## 2021-08-10 DIAGNOSIS — K279 Peptic ulcer, site unspecified, unspecified as acute or chronic, without hemorrhage or perforation: Secondary | ICD-10-CM | POA: Diagnosis not present

## 2021-08-10 DIAGNOSIS — E1159 Type 2 diabetes mellitus with other circulatory complications: Secondary | ICD-10-CM

## 2021-08-10 DIAGNOSIS — U071 COVID-19: Secondary | ICD-10-CM | POA: Diagnosis not present

## 2021-08-10 MED ORDER — BENZONATATE 200 MG PO CAPS: 200.0000 mg | ORAL_CAPSULE | Freq: Two times a day (BID) | ORAL | 0 refills | Status: DC | PRN

## 2021-08-10 MED ORDER — MOLNUPIRAVIR 200 MG PO CAPS: 4.0000 | ORAL_CAPSULE | Freq: Two times a day (BID) | ORAL | 0 refills | Status: AC

## 2021-08-10 NOTE — Progress Notes (Signed)
° °  [name] [name] is a 71 y.o. male who presents today for a virtual office visit.  Assessment/Plan:  New/Acute Problems: COVID No red flags that he is at increased risk. Will start molnupiravir and tessalon. He can continue OTC medications. Encouraged hydration. Discussed reasons to return to care and seek emergent care. Follow up as needed.   PUD No new episodes of bleeding. Will continue management per GI. Need repeat labs soon.   Chronic Problems Addressed Today: HTN- Controlled on coreg 3.125mg  twice daily, irbesartan 300mg  daily, and spironolactone 25mg  daily. Increases risk for complication from covid.   T2DM- At goal on recent labs on metformin 1000mg  daily. Increases risk for complication from covid.    Subjective:  HPI:  Patient here with covid. Symptoms started about 3 days ago. Symptoms include cough, rhinorrhea, headache, fever, and fatigue. Home test was positive yesterday. Tried using an allergy nasal spray with some improvement.        Objective/Observations  Physical Exam: Gen: NAD, resting comfortably Pulm: Normal work of breathing Neuro: Grossly normal, moves all extremities Psych: Normal affect and thought content  Virtual Visit via Video   I connected with [name] on 08/10/21 at 11:40 AM EST by a video enabled telemedicine application and verified that I am speaking with the correct person using two identifiers. The limitations of evaluation and management by telemedicine and the availability of in person appointments were discussed. The patient expressed understanding and agreed to proceed.   Patient location: Home Provider location: [place] participating in the virtual visit: Myself and Patient     Algis Greenhouse. [name], MD 08/10/2021 8:32 AM

## 2021-08-16 DIAGNOSIS — Z20822 Contact with and (suspected) exposure to covid-19: Secondary | ICD-10-CM | POA: Diagnosis not present

## 2021-08-23 DIAGNOSIS — Z471 Aftercare following joint replacement surgery: Secondary | ICD-10-CM | POA: Diagnosis not present

## 2021-08-23 DIAGNOSIS — Z96651 Presence of right artificial knee joint: Secondary | ICD-10-CM | POA: Diagnosis not present

## 2021-08-25 ENCOUNTER — Encounter (HOSPITAL_COMMUNITY): Payer: Self-pay | Admitting: Gastroenterology

## 2021-08-27 ENCOUNTER — Encounter: Payer: Self-pay | Admitting: Gastroenterology

## 2021-08-27 ENCOUNTER — Ambulatory Visit (INDEPENDENT_AMBULATORY_CARE_PROVIDER_SITE_OTHER): Payer: Medicare Other | Admitting: Gastroenterology

## 2021-08-27 ENCOUNTER — Other Ambulatory Visit (INDEPENDENT_AMBULATORY_CARE_PROVIDER_SITE_OTHER): Payer: Medicare Other

## 2021-08-27 VITALS — BP 100/64 | HR 69 | Ht 69.0 in | Wt 175.0 lb

## 2021-08-27 DIAGNOSIS — Z86010 Personal history of colonic polyps: Secondary | ICD-10-CM | POA: Diagnosis not present

## 2021-08-27 DIAGNOSIS — R933 Abnormal findings on diagnostic imaging of other parts of digestive tract: Secondary | ICD-10-CM

## 2021-08-27 DIAGNOSIS — D126 Benign neoplasm of colon, unspecified: Secondary | ICD-10-CM | POA: Diagnosis not present

## 2021-08-27 DIAGNOSIS — D122 Benign neoplasm of ascending colon: Secondary | ICD-10-CM

## 2021-08-27 LAB — CBC
HCT: 36.3 % — ABNORMAL LOW (ref 39.0–52.0)
Hemoglobin: 12 g/dL — ABNORMAL LOW (ref 13.0–17.0)
MCHC: 32.9 g/dL (ref 30.0–36.0)
MCV: 85.9 fl (ref 78.0–100.0)
Platelets: 316 10*3/uL (ref 150.0–400.0)
RBC: 4.23 Mil/uL (ref 4.22–5.81)
RDW: 15.7 % — ABNORMAL HIGH (ref 11.5–15.5)
WBC: 8.2 10*3/uL (ref 4.0–10.5)

## 2021-08-27 LAB — IBC + FERRITIN
Ferritin: 14.6 ng/mL — ABNORMAL LOW (ref 22.0–322.0)
Iron: 25 ug/dL — ABNORMAL LOW (ref 42–165)
Saturation Ratios: 5.9 % — ABNORMAL LOW (ref 20.0–50.0)
TIBC: 421.4 ug/dL (ref 250.0–450.0)
Transferrin: 301 mg/dL (ref 212.0–360.0)

## 2021-08-27 LAB — PROTIME-INR
INR: 0.9 ratio (ref 0.8–1.0)
Prothrombin Time: 10.4 s (ref 9.6–13.1)

## 2021-08-27 LAB — BASIC METABOLIC PANEL
BUN: 16 mg/dL (ref 6–23)
CO2: 28 mEq/L (ref 19–32)
Calcium: 9.6 mg/dL (ref 8.4–10.5)
Chloride: 105 mEq/L (ref 96–112)
Creatinine, Ser: 0.91 mg/dL (ref 0.40–1.50)
GFR: 84.74 mL/min (ref >60.00)
Glucose, Bld: 156 mg/dL — ABNORMAL HIGH (ref 70–99)
Potassium: 4 mEq/L (ref 3.5–5.1)
Sodium: 142 mEq/L (ref 135–145)

## 2021-08-27 NOTE — Progress Notes (Signed)
° °GASTROENTEROLOGY OUTPATIENT CLINIC VISIT  ° °Primary Care Provider °Parker, Caleb M, MD °[address] °[place] [address] °[phone] ° °Referring Provider °Dr. Gessner ° °Patient Profile: °[name] [name] is a 70 y.o. male with a pmh significant for CAD (on Plavix), hypertension, hyperlipidemia, diabetes, nephrolithiasis, GERD, PUD (manifested as DU), hemorrhoids, colon polyps (AC TA left in situ).  The patient presents to the [facility] for an evaluation and management of problem(s) noted below: ° °Problem List °1. Tubular adenoma of ascending colon   °2. Hx of adenomatous colonic polyps   °3. Abnormal colonoscopy   ° ° °History of Present Illness °Please see prior notes for full details of HPI. ° °Interval History °The patient underwent a colonoscopy for follow-up/since the patient's last clinic visit, he underwent a colonoscopy for surveillance of prior colon polyps (last colonoscopy before had been 2012) with Dr. Gessner and was found to have a large ascending colon polyp taking up one half of the circumference of the a sending colon wall.  Biopsies showed evidence of tubular adenoma.  It is for this reason that the patient is referred for consideration of advanced resection.  The patient states that he is otherwise doing well.  He is not experiencing any issues from a GI standpoint currently.  He continues to take his Plavix but knows that he will be stopping this on Saturday as his 5 days before his procedure begins.   ° °GI Review of Systems °Positive as above °Negative for dysphagia, odynophagia, pain, alteration of bowel habits, melena, hematochezia ° °Review of Systems °General: Denies fevers/chills/weight loss unintentionally °Cardiovascular: Denies chest pain °Pulmonary: Denies shortness of breath °Gastroenterological: See HPI °Genitourinary: Denies darkened urine °Hematological: Positive for history of easy bruising/bleeding due to Plavix °Dermatological: Denies  jaundice °Psychological: Mood is stable ° ° °Medications °Current Outpatient Medications  °Medication Sig Dispense Refill  ° Accu-Chek Softclix Lancets lancets 1 each by Other route 3 (three) times daily. 102 each 11  ° atorvastatin (LIPITOR) 80 MG tablet TAKE 1 TABLET DAILY AT     6:00PM 90 tablet 0  ° Blood Glucose Monitoring Suppl (ACCU-CHEK AVIVA PLUS) w/Device KIT Check glucose TID or as needed E11.9 1 kit 0  ° carvedilol (COREG) 3.125 MG tablet TAKE 1 TABLET TWICE DAILY  WITH MEALS. 180 tablet 0  ° clopidogrel (PLAVIX) 75 MG tablet Take 1 tablet (75 mg total) by mouth daily. 90 tablet 1  ° Coenzyme Q10 200 MG capsule Take 200 mg by mouth daily.    ° famotidine-calcium carbonate-magnesium hydroxide (PEPCID COMPLETE) 10-800-165 MG chewable tablet Chew 0.5 tablets by mouth every 6 (six) hours as needed (heartburn).    ° glucose blood test strip Use as instructed 100 each 12  ° Homeopathic Products (ZICAM ALLERGY RELIEF NA) Place 1 spray into the nose daily as needed (colds).    ° irbesartan (AVAPRO) 300 MG tablet TAKE 1 TABLET DAILY. 90 tablet 0  ° metFORMIN (GLUCOPHAGE-XR) 500 MG 24 hr tablet TAKE 2 TABLETS DAILY AFTER SUPPER 180 tablet 3  ° Multiple Vitamins-Minerals (MULTIVITAMIN WITH MINERALS) tablet Take 1 tablet by mouth daily.    ° nitroGLYCERIN (NITROSTAT) 0.4 MG SL tablet Place 1 tablet (0.4 mg total) under the tongue every 5 (five) minutes as needed for chest pain. 100 tablet 1  ° pantoprazole (PROTONIX) 40 MG tablet Take 40 mg by mouth daily.    ° sildenafil (REVATIO) 20 MG tablet Take 1-5 tablets by mouth daily as needed. 100 tablet 0  °   sodium chloride (OCEAN) 0.65 % SOLN nasal spray Place 1 spray into both nostrils as needed for congestion.    ° spironolactone (ALDACTONE) 25 MG tablet TAKE 1 TABLET DAILY 90 tablet 0  ° Undecylenic Ac-Zn Undecylenate (FUNGI-NAIL TOE & FOOT EX) Apply 1 application topically See admin instructions. Apply to bilateral big toes every other day    ° vitamin B-12  (CYANOCOBALAMIN) 1000 MCG tablet Take 1,000 mcg by mouth daily.    ° °No current facility-administered medications for this visit.  ° ° °Allergies °Allergies  °Allergen Reactions  ° Aspirin Hives, Swelling and Other (See Comments)  °  Angioedema  ° Oxycodone Nausea Only  ° Contrast Media [Iodinated Contrast Media] Hives, Itching, Rash and Other (See Comments)  °  Developed hives despite pre-treatment  ° Norvasc [Amlodipine Besylate] Other (See Comments) and Cough  °  09/12/2013 gingival hyperplasia , Dr Knox ,DDS  ° ° °Histories °Past Medical History:  °Diagnosis Date  ° Adenomatous colon polyp 09/07/2004  ° Amblyopia   ° CAD (coronary artery disease)   ° cardiac CT with a 50% LAD lesion in 2007  ° Diverticulosis   ° DM2 (diabetes mellitus, type 2) (HCC)   ° Elevated LFTs   ° secondary to labetalol  ° Esophageal stricture 2003  ° External hemorrhoids 2012  ° lanced by Dr Weatherly  ° Family history of anesthesia complication   ° "sister had PONV"  ° GERD (gastroesophageal reflux disease)   ° esophageal stricture, pmh  ° Hiatal hernia 01/04/2000  ° History of kidney stones 2022  ° HLD (hyperlipidemia)   ° HTN (hypertension)   ° Hypopotassemia   ° °Past Surgical History:  °Procedure Laterality Date  ° BIOPSY  06/05/2021  ° Procedure: BIOPSY;  Surgeon: Gessner, Carl E, MD;  Location: [facility];  Service: Endoscopy;;  ° COLONOSCOPY  06/27/2004  ° tics , ADENOMATOUS polyps. F/U due 2011  ° COLONOSCOPY  06/27/2010  ° Tics  ° CORONARY ANGIOPLASTY WITH STENT PLACEMENT  04/01/2014  ° "2"  ° ESOPHAGEAL DILATION  06/27/2001  ° ESOPHAGOGASTRODUODENOSCOPY  06/27/2001  ° ERD, stricture dilated  ° ESOPHAGOGASTRODUODENOSCOPY (EGD) WITH PROPOFOL N/A 06/05/2021  ° Procedure: ESOPHAGOGASTRODUODENOSCOPY (EGD) WITH PROPOFOL;  Surgeon: Gessner, Carl E, MD;  Location: [facility];  Service: Endoscopy;  Laterality: N/A;  ° EXCISIONAL HEMORRHOIDECTOMY  ~ 2008  ° LEFT HEART CATHETERIZATION WITH CORONARY ANGIOGRAM N/A 03/26/2014  °  Procedure: LEFT HEART CATHETERIZATION WITH CORONARY ANGIOGRAM;  Surgeon: Peter M Jordan, MD;  Location: [facility];  Service: Cardiovascular;  Laterality: N/A;  ° PERCUTANEOUS CORONARY STENT INTERVENTION (PCI-S) N/A 04/01/2014  ° Procedure: PERCUTANEOUS CORONARY STENT INTERVENTION (PCI-S);  Surgeon: Peter M Jordan, MD;  Location: [facility];  Service: Cardiovascular;  Laterality: N/A;  ° POSTERIOR LAMINECTOMY / DECOMPRESSION LUMBAR SPINE  12/25/2009  ° L4 , Dr Nudelman  ° SHOULDER ARTHROSCOPY WITH ROTATOR CUFF REPAIR AND OPEN BICEPS TENODESIS Left 02/04/2019  ° Procedure: LEFT SHOULDER ARTHROSCOPY, BICEPS TENODESIS, MINI OPEN ROTATOR CUFF TEAR REPAIR;  Surgeon: Dean, Gregory Scott, MD;  Location: [facility];  Service: Orthopedics;  Laterality: Left;  ° TOTAL KNEE ARTHROPLASTY Right 05/11/2021  ° Procedure: TOTAL KNEE ARTHROPLASTY;  Surgeon: Olin, Matthew, MD;  Location: [facility];  Service: Orthopedics;  Laterality: Right;  ° °Social History  ° °Socioeconomic History  ° Marital status: Single  °  Spouse name: Not on file  ° Number of children: 0  ° Years of education: 16  ° Highest education level:   Not on file  °Occupational History  ° Occupation: Retired   °Tobacco Use  ° Smoking status: Never  ° Smokeless tobacco: Never  °Vaping Use  ° Vaping Use: Never used  °Substance and Sexual Activity  ° Alcohol use: Yes  °  Alcohol/week: 2.0 standard drinks  °  Types: 2 Cans of beer per week  °  Comment: social  ° Drug use: No  ° Sexual activity: Not Currently  °Other Topics Concern  ° Not on file  °Social History Narrative  ° Fun: Golf and poker.   ° °Social Determinants of Health  ° °Financial Resource Strain: Low Risk   ° Difficulty of Paying Living Expenses: Not hard at all  °Food Insecurity: Not on file  °Transportation Needs: Unknown  ° Lack of Transportation (Medical): No  ° Lack of Transportation (Non-Medical): Not on file  °Physical Activity: Insufficiently Active  ° Days of Exercise per Week: 3 days   ° Minutes of Exercise per Session: 20 min  °Stress: Not on file  °Social Connections: Moderately Isolated  ° Frequency of Communication with Friends and Family: More than three times a week  ° Frequency of Social Gatherings with Friends and Family: More than three times a week  ° Attends Religious Services: Never  ° Active Member of Clubs or Organizations: No  ° Attends Club or Organization Meetings: Never  ° Marital Status: Married  °Intimate Partner Violence: Not on file  ° °Family History  °Problem Relation Age of Onset  ° Heart attack Mother 77  ° Stroke Mother 75  ° Prostate cancer Father   ° Diabetes Father   ° Heart disease Father 87  °     CBAG   ° Heart disease Sister   ° Diabetes Brother   ° Heart attack Brother   ° Breast cancer Maternal Aunt   ° Heart attack Maternal Grandfather 61  ° Diabetes Paternal Grandmother   ° Stroke Paternal Grandfather 87  ° Stomach cancer Neg Hx   ° Rectal cancer Neg Hx   ° Esophageal cancer Neg Hx   ° Colon cancer Neg Hx   ° Inflammatory bowel disease Neg Hx   ° Liver disease Neg Hx   ° Pancreatic cancer Neg Hx   ° °I have reviewed his medical, social, and family history in detail and updated the electronic medical record as necessary.  ° ° °PHYSICAL EXAMINATION  °BP 100/64    Pulse 69    Ht 5' 9" (1.753 m)    Wt 175 lb (79.4 kg)    BMI 25.84 kg/m²  °Wt Readings from Last 3 Encounters:  °08/27/21 175 lb (79.4 kg)  °08/10/21 174 lb (78.9 kg)  °07/13/21 174 lb (78.9 kg)  °GEN: NAD, appears stated age, doesn't appear chronically ill °PSYCH: Cooperative, without pressured speech °EYE: Conjunctivae pink, sclerae anicteric °ENT: MMM °CV: Nontachycardic °RESP: No audible wheezing °GI: NABS, soft, NT/ND, without rebound °MSK/EXT: No lower extremity edema °SKIN: No jaundice °NEURO:  Alert & Oriented x 3, no focal deficits ° ° °REVIEW OF DATA  °I reviewed the following data at the time of this encounter: ° °GI Procedures and Studies  °January 2023 colonoscopy °- One large polyp in the  ascending colon. Biopsied. Tattooed. 1/2 circumference lesion - soft and benign-appearing. °- Diverticulosis in the sigmoid colon. °- The examination was otherwise normal on direct and retroflexion views. ° °Pathology °Diagnosis °Surgical [P], colon, ascending polypoid mass °- TUBULAR ADENOMA, NEGATIVE FOR HIGH-GRADE DYSPLASIA. °- NEGATIVE FOR MALIGNANCY. ° °  Laboratory Studies  °Reviewed those in epic ° °Imaging Studies  °No relevant studies to review ° ° °ASSESSMENT  °Mr. Sholtz is a 70 y.o. male with a pmh significant for CAD (on Plavix), hypertension, hyperlipidemia, diabetes, nephrolithiasis, GERD, PUD (manifested as DU), hemorrhoids, colon polyps (AC TA left in situ).  The patient is seen today for evaluation and management of: ° °1. Tubular adenoma of ascending colon   °2. Hx of adenomatous colonic polyps   °3. Abnormal colonoscopy   ° °The patient is clinically and hemodynamically stable.  Based upon the description and endoscopic pictures I do feel that it is reasonable to pursue an Advanced Polypectomy attempt of the polyp/lesion.  We discussed some of the techniques of advanced polypectomy which include Endoscopic Mucosal Resection, OVESCO Full-Thickness Resection, Endorotor Morcellation, and Tissue Ablation via Fulguration.  We also reviewed images of typical techniques as noted above.  The risks and benefits of endoscopic evaluation were discussed with the patient; these include but are not limited to the risk of perforation, infection, bleeding, missed lesions, lack of diagnosis, severe illness requiring hospitalization, as well as anesthesia and sedation related illnesses.  During attempts at advanced resection, the risks of bleeding and perforation/leak are increased as opposed to diagnostic and screening procedures, and that was discussed with the patient as well.   In addition, I explained that with the possible need for piecemeal resection, subsequent short-interval endoscopic evaluation for follow  up and potential retreatment of the lesion/area may be necessary.  I did offer, a referral to surgery in order for patient to have opportunity to discuss surgical management/intervention prior to finalizing decision for attempt at endoscopic removal, however, the patient deferred on this.  If, after attempt at removal of the polyp/lesion, it is found that the patient has a complication or that an invasive lesion or malignant lesion is found, or that the polyp/lesion continues to recur, the patient is aware and understands that surgery may still be indicated/required.  All patient questions were answered, to the best of my ability, and the patient agrees to the aforementioned plan of action with follow-up as indicated. ° ° °PLAN  °Preprocedure labs as outlined below °If patient has evidence of persistent iron deficiency, recommend repeat upper endoscopy to ensure healing of previous ulcer at the time of his colonoscopy °Proceed otherwise with planned colonoscopy with EMR attempt next week ° ° °Orders Placed This Encounter  °Procedures  ° CBC  ° Basic Metabolic Panel (BMET)  ° IBC + Ferritin  ° INR/PT  ° ° °New Prescriptions  ° No medications on file  ° °Modified Medications  ° No medications on file  ° ° °Planned Follow Up °No follow-ups on file. ° ° °Total Time in Face-to-Face and in Coordination of Care for patient including independent/personal interpretation/review of prior testing, medical history, examination, medication adjustment, communicating results with the patient directly, and documentation within the EHR is 35 minutes. ° ° °Eran Mistry Mansouraty, MD °[facility] °Advanced Endoscopy °Office # [phone] ° °

## 2021-08-27 NOTE — H&P (View-Only) (Signed)
[facility] VISIT   Primary Care Provider Vivi Barrack, [place] [address] [phone]  Referring Provider Dr. Carlean Purl  Patient Profile: [name] [name] is a 71 y.o. male with a pmh significant for CAD (on Plavix), hypertension, hyperlipidemia, diabetes, nephrolithiasis, GERD, PUD (manifested as DU), hemorrhoids, colon polyps (AC TA left in situ).  The patient presents to the [facility] for an evaluation and management of problem(s) noted below:  Problem List 1. Tubular adenoma of ascending colon   2. Hx of adenomatous colonic polyps   3. Abnormal colonoscopy     History of Present Illness Please see prior notes for full details of HPI.  Interval History The patient underwent a colonoscopy for follow-up/since the patient's last clinic visit, he underwent a colonoscopy for surveillance of prior colon polyps (last colonoscopy before had been 2012) with Dr. Carlean Purl and was found to have a large ascending colon polyp taking up one half of the circumference of the a sending colon wall.  Biopsies showed evidence of tubular adenoma.  It is for this reason that the patient is referred for consideration of advanced resection.  The patient states that he is otherwise doing well.  He is not experiencing any issues from a GI standpoint currently.  He continues to take his Plavix but knows that he will be stopping this on Saturday as his 5 days before his procedure begins.    GI Review of Systems Positive as above Negative for dysphagia, odynophagia, pain, alteration of bowel habits, melena, hematochezia  Review of Systems General: Denies fevers/chills/weight loss unintentionally Cardiovascular: Denies chest pain Pulmonary: Denies shortness of breath Gastroenterological: See HPI Genitourinary: Denies darkened urine Hematological: Positive for history of easy bruising/bleeding due to Plavix Dermatological: Denies  jaundice Psychological: Mood is stable   Medications Current Outpatient Medications  Medication Sig Dispense Refill   Accu-Chek Softclix Lancets lancets 1 each by Other route 3 (three) times daily. 102 each 11   atorvastatin (LIPITOR) 80 MG tablet TAKE 1 TABLET DAILY AT     6:00PM 90 tablet 0   Blood Glucose Monitoring Suppl (ACCU-CHEK AVIVA PLUS) w/Device KIT Check glucose TID or as needed E11.9 1 kit 0   carvedilol (COREG) 3.125 MG tablet TAKE 1 TABLET TWICE DAILY  WITH MEALS. 180 tablet 0   clopidogrel (PLAVIX) 75 MG tablet Take 1 tablet (75 mg total) by mouth daily. 90 tablet 1   Coenzyme Q10 200 MG capsule Take 200 mg by mouth daily.     famotidine-calcium carbonate-magnesium hydroxide (PEPCID COMPLETE) 10-800-165 MG chewable tablet Chew 0.5 tablets by mouth every 6 (six) hours as needed (heartburn).     glucose blood test strip Use as instructed 100 each 12   Homeopathic Products (ZICAM ALLERGY RELIEF NA) Place 1 spray into the nose daily as needed (colds).     irbesartan (AVAPRO) 300 MG tablet TAKE 1 TABLET DAILY. 90 tablet 0   metFORMIN (GLUCOPHAGE-XR) 500 MG 24 hr tablet TAKE 2 TABLETS DAILY AFTER SUPPER 180 tablet 3   Multiple Vitamins-Minerals (MULTIVITAMIN WITH MINERALS) tablet Take 1 tablet by mouth daily.     nitroGLYCERIN (NITROSTAT) 0.4 MG SL tablet Place 1 tablet (0.4 mg total) under the tongue every 5 (five) minutes as needed for chest pain. 100 tablet 1   pantoprazole (PROTONIX) 40 MG tablet Take 40 mg by mouth daily.     sildenafil (REVATIO) 20 MG tablet Take 1-5 tablets by mouth daily as needed. 100 tablet 0  sodium chloride (OCEAN) 0.65 % SOLN nasal spray Place 1 spray into both nostrils as needed for congestion.     spironolactone (ALDACTONE) 25 MG tablet TAKE 1 TABLET DAILY 90 tablet 0   Undecylenic Ac-Zn Undecylenate (FUNGI-NAIL TOE & FOOT EX) Apply 1 application topically See admin instructions. Apply to bilateral big toes every other day     vitamin B-12  (CYANOCOBALAMIN) 1000 MCG tablet Take 1,000 mcg by mouth daily.     No current facility-administered medications for this visit.    Allergies Allergies  Allergen Reactions   Aspirin Hives, Swelling and Other (See Comments)    Angioedema   Oxycodone Nausea Only   Contrast Media [Iodinated Contrast Media] Hives, Itching, Rash and Other (See Comments)    Developed hives despite pre-treatment   Norvasc [Amlodipine Besylate] Other (See Comments) and Cough    09/12/2013 gingival hyperplasia , Dr Geralynn Ochs ,DDS    Histories Past Medical History:  Diagnosis Date   Adenomatous colon polyp 09/07/2004   Amblyopia    CAD (coronary artery disease)    cardiac CT with a 50% LAD lesion in 2007   Diverticulosis    DM2 (diabetes mellitus, type 2) (HCC)    Elevated LFTs    secondary to labetalol   Esophageal stricture 2003   External hemorrhoids 2012   lanced by Dr Rise Patience   Family history of anesthesia complication    "sister had PONV"   GERD (gastroesophageal reflux disease)    esophageal stricture, pmh   Hiatal hernia 01/04/2000   History of kidney stones 2022   HLD (hyperlipidemia)    HTN (hypertension)    Hypopotassemia    Past Surgical History:  Procedure Laterality Date   BIOPSY  06/05/2021   Procedure: BIOPSY;  Surgeon: Gatha Mayer, MD;  Location: [facility];  Service: Endoscopy;;   COLONOSCOPY  06/27/2004   tics , ADENOMATOUS polyps. F/U due 2011   COLONOSCOPY  06/27/2010   Tics   CORONARY ANGIOPLASTY WITH STENT PLACEMENT  04/01/2014   "2"   ESOPHAGEAL DILATION  06/27/2001   ESOPHAGOGASTRODUODENOSCOPY  06/27/2001   ERD, stricture dilated   ESOPHAGOGASTRODUODENOSCOPY (EGD) WITH PROPOFOL N/A 06/05/2021   Procedure: ESOPHAGOGASTRODUODENOSCOPY (EGD) WITH PROPOFOL;  Surgeon: Gatha Mayer, MD;  Location: [facility];  Service: Endoscopy;  Laterality: N/A;   EXCISIONAL HEMORRHOIDECTOMY  ~ 2008   LEFT HEART CATHETERIZATION WITH CORONARY ANGIOGRAM N/A 03/26/2014    Procedure: LEFT HEART CATHETERIZATION WITH CORONARY ANGIOGRAM;  Surgeon: Peter M Martinique, MD;  Location: [facility] CATH LAB;  Service: Cardiovascular;  Laterality: N/A;   PERCUTANEOUS CORONARY STENT INTERVENTION (PCI-S) N/A 04/01/2014   Procedure: PERCUTANEOUS CORONARY STENT INTERVENTION (PCI-S);  Surgeon: Peter M Martinique, MD;  Location[facility] CATH LAB;  Service: Cardiovascular;  Laterality: N/A;   POSTERIOR LAMINECTOMY / DECOMPRESSION LUMBAR SPINE  12/25/2009   L4 , Dr Sherwood Gambler   SHOULDER ARTHROSCOPY WITH ROTATOR CUFF REPAIR AND OPEN BICEPS TENODESIS Left 02/04/2019   Procedure: LEFT SHOULDER ARTHROSCOPY, BICEPS TENODESIS, MINI OPEN ROTATOR CUFF TEAR REPAIR;  Surgeon: Meredith Pel, MD;  Location: [facility];  Service: Orthopedics;  Laterality: Left;   TOTAL KNEE ARTHROPLASTY Right 05/11/2021   Procedure: TOTAL KNEE ARTHROPLASTY;  Surgeon: Paralee Cancel, MD;  Location: [facility];  Service: Orthopedics;  Laterality: Right;   Social History   Socioeconomic History   Marital status: Single    Spouse name: Not on file   Number of children: 0   Years of education: 16   Highest education level:  Not on file  Occupational History   Occupation: Retired   Tobacco Use   Smoking status: Never   Smokeless tobacco: Never  Vaping Use   Vaping Use: Never used  Substance and Sexual Activity   Alcohol use: Yes    Alcohol/week: 2.0 standard drinks    Types: 2 Cans of beer per week    Comment: social   Drug use: No   Sexual activity: Not Currently  Other Topics Concern   Not on file  Social History Narrative   Fun: Designer, jewellery.    Social Determinants of Health   Financial Resource Strain: Low Risk    Difficulty of Paying Living Expenses: Not hard at all  Food Insecurity: Not on file  Transportation Needs: Unknown   Lack of Transportation (Medical): No   Lack of Transportation (Non-Medical): Not on file  Physical Activity: Insufficiently Active   Days of Exercise per Week: 3 days    Minutes of Exercise per Session: 20 min  Stress: Not on file  Social Connections: Moderately Isolated   Frequency of Communication with Friends and Family: More than three times a week   Frequency of Social Gatherings with Friends and Family: More than three times a week   Attends Religious Services: Never   Marine scientist or Organizations: No   Attends Music therapist: Never   Marital Status: Married  Human resources officer Violence: Not on file   Family History  Problem Relation Age of Onset   Heart attack Mother 60   Stroke Mother 54   Prostate cancer Father    Diabetes Father    Heart disease Father 75       CBAG    Heart disease Sister    Diabetes Brother    Heart attack Brother    Breast cancer Maternal Aunt    Heart attack Maternal Grandfather 44   Diabetes Paternal Grandmother    Stroke Paternal Grandfather 87   Stomach cancer Neg Hx    Rectal cancer Neg Hx    Esophageal cancer Neg Hx    Colon cancer Neg Hx    Inflammatory bowel disease Neg Hx    Liver disease Neg Hx    Pancreatic cancer Neg Hx    I have reviewed his medical, social, and family history in detail and updated the electronic medical record as necessary.    PHYSICAL EXAMINATION  BP 100/64    Pulse 69    Ht _0  (1.753 m)    Wt 175 lb (79.4 kg)    BMI 25.84 kg/m  Wt Readings from Last 3 Encounters:  08/27/21 175 lb (79.4 kg)  08/10/21 174 lb (78.9 kg)  07/13/21 174 lb (78.9 kg)  GEN: NAD, appears stated age, doesn't appear chronically ill PSYCH: Cooperative, without pressured speech EYE: Conjunctivae pink, sclerae anicteric ENT: MMM CV: Nontachycardic RESP: No audible wheezing GI: NABS, soft, NT/ND, without rebound MSK/EXT: No lower extremity edema SKIN: No jaundice NEURO:  Alert & Oriented x 3, no focal deficits   REVIEW OF DATA  I reviewed the following data at the time of this encounter:  GI Procedures and Studies  January 2023 colonoscopy - One large polyp in the  ascending colon. Biopsied. Tattooed. 1/2 circumference lesion - soft and benign-appearing. - Diverticulosis in the sigmoid colon. - The examination was otherwise normal on direct and retroflexion views.  Pathology Diagnosis Surgical [P], colon, ascending polypoid mass - TUBULAR ADENOMA, NEGATIVE FOR HIGH-GRADE DYSPLASIA. - NEGATIVE FOR MALIGNANCY.  Laboratory Studies  Reviewed those in epic  Imaging Studies  No relevant studies to review   ASSESSMENT  Mr. Pelfrey is a 71 y.o. male with a pmh significant for CAD (on Plavix), hypertension, hyperlipidemia, diabetes, nephrolithiasis, GERD, PUD (manifested as DU), hemorrhoids, colon polyps (AC TA left in situ).  The patient is seen today for evaluation and management of:  1. Tubular adenoma of ascending colon   2. Hx of adenomatous colonic polyps   3. Abnormal colonoscopy    The patient is clinically and hemodynamically stable.  Based upon the description and endoscopic pictures I do feel that it is reasonable to pursue an Advanced Polypectomy attempt of the polyp/lesion.  We discussed some of the techniques of advanced polypectomy which include Endoscopic Mucosal Resection, OVESCO Full-Thickness Resection, Endorotor Morcellation, and Tissue Ablation via Fulguration.  We also reviewed images of typical techniques as noted above.  The risks and benefits of endoscopic evaluation were discussed with the patient; these include but are not limited to the risk of perforation, infection, bleeding, missed lesions, lack of diagnosis, severe illness requiring hospitalization, as well as anesthesia and sedation related illnesses.  During attempts at advanced resection, the risks of bleeding and perforation/leak are increased as opposed to diagnostic and screening procedures, and that was discussed with the patient as well.   In addition, I explained that with the possible need for piecemeal resection, subsequent short-interval endoscopic evaluation for follow  up and potential retreatment of the lesion/area may be necessary.  I did offer, a referral to surgery in order for patient to have opportunity to discuss surgical management/intervention prior to finalizing decision for attempt at endoscopic removal, however, the patient deferred on this.  If, after attempt at removal of the polyp/lesion, it is found that the patient has a complication or that an invasive lesion or malignant lesion is found, or that the polyp/lesion continues to recur, the patient is aware and understands that surgery may still be indicated/required.  All patient questions were answered, to the best of my ability, and the patient agrees to the aforementioned plan of action with follow-up as indicated.   PLAN  Preprocedure labs as outlined below If patient has evidence of persistent iron deficiency, recommend repeat upper endoscopy to ensure healing of previous ulcer at the time of his colonoscopy Proceed otherwise with planned colonoscopy with EMR attempt next week   Orders Placed This Encounter  Procedures   CBC   Basic Metabolic Panel (BMET)   IBC + Ferritin   INR/PT    New Prescriptions   No medications on file   Modified Medications   No medications on file    Planned Follow Up No follow-ups on file.   Total Time in Face-to-Face and in Coordination of Care for patient including independent/personal interpretation/review of prior testing, medical history, examination, medication adjustment, communicating results with the patient directly, and documentation within the EHR is 35 minutes.   Justice Britain, MD [facility] Advanced Endoscopy Office # [phone]

## 2021-08-27 NOTE — Patient Instructions (Addendum)
Your provider has requested that you go to the basement level for lab work before leaving today. Press "B" on the elevator. The lab is located at the first door on the left as you exit the elevator. ? ?You have been scheduled for a colonoscopy. Please follow written instructions given to you at your visit today.  ?Please pick up your prep supplies at the pharmacy within the next 1-3 days. ?If you use inhalers (even only as needed), please bring them with you on the day of your procedure. ? ?If your labs return and show signs of Iron deficiency , then a Upper Endoscopy will be added to your scheduled colonoscopy on 09/02/21.  ? ?Thank you for choosing me and [facility]. ? ?Dr. Rush Landmark ?  ?

## 2021-08-29 ENCOUNTER — Encounter: Payer: Self-pay | Admitting: Gastroenterology

## 2021-08-29 DIAGNOSIS — R933 Abnormal findings on diagnostic imaging of other parts of digestive tract: Secondary | ICD-10-CM | POA: Insufficient documentation

## 2021-08-29 DIAGNOSIS — D126 Benign neoplasm of colon, unspecified: Secondary | ICD-10-CM | POA: Insufficient documentation

## 2021-08-29 DIAGNOSIS — Z86010 Personal history of colonic polyps: Secondary | ICD-10-CM | POA: Insufficient documentation

## 2021-08-29 DIAGNOSIS — Z860101 Personal history of adenomatous and serrated colon polyps: Secondary | ICD-10-CM | POA: Insufficient documentation

## 2021-08-31 DIAGNOSIS — D509 Iron deficiency anemia, unspecified: Secondary | ICD-10-CM | POA: Insufficient documentation

## 2021-09-02 ENCOUNTER — Encounter (HOSPITAL_COMMUNITY): Payer: Self-pay | Admitting: Gastroenterology

## 2021-09-02 ENCOUNTER — Other Ambulatory Visit: Payer: Self-pay

## 2021-09-02 ENCOUNTER — Encounter (HOSPITAL_COMMUNITY)
Admission: RE | Disposition: A | Discharge status: Home / Self Care | Payer: Self-pay | Source: Home / Self Care | Attending: Gastroenterology

## 2021-09-02 ENCOUNTER — Ambulatory Visit (HOSPITAL_BASED_OUTPATIENT_CLINIC_OR_DEPARTMENT_OTHER): Payer: Medicare Other | Admitting: Anesthesiology

## 2021-09-02 ENCOUNTER — Ambulatory Visit (HOSPITAL_COMMUNITY): Payer: Medicare Other | Admitting: Anesthesiology

## 2021-09-02 ENCOUNTER — Ambulatory Visit (HOSPITAL_COMMUNITY)
Admission: RE | Admit: 2021-09-02 | Discharge: 2021-09-02 | Disposition: A | Discharge status: Home / Self Care | Payer: Medicare Other | Attending: Gastroenterology | Admitting: Gastroenterology

## 2021-09-02 DIAGNOSIS — K264 Chronic or unspecified duodenal ulcer with hemorrhage: Secondary | ICD-10-CM

## 2021-09-02 DIAGNOSIS — K3189 Other diseases of stomach and duodenum: Secondary | ICD-10-CM

## 2021-09-02 DIAGNOSIS — K635 Polyp of colon: Secondary | ICD-10-CM

## 2021-09-02 DIAGNOSIS — K31811 Angiodysplasia of stomach and duodenum with bleeding: Secondary | ICD-10-CM

## 2021-09-02 DIAGNOSIS — Z955 Presence of coronary angioplasty implant and graft: Secondary | ICD-10-CM | POA: Insufficient documentation

## 2021-09-02 DIAGNOSIS — D509 Iron deficiency anemia, unspecified: Secondary | ICD-10-CM | POA: Diagnosis not present

## 2021-09-02 DIAGNOSIS — K641 Second degree hemorrhoids: Secondary | ICD-10-CM | POA: Insufficient documentation

## 2021-09-02 DIAGNOSIS — D759 Disease of blood and blood-forming organs, unspecified: Secondary | ICD-10-CM | POA: Diagnosis not present

## 2021-09-02 DIAGNOSIS — K2289 Other specified disease of esophagus: Secondary | ICD-10-CM | POA: Insufficient documentation

## 2021-09-02 DIAGNOSIS — D122 Benign neoplasm of ascending colon: Secondary | ICD-10-CM

## 2021-09-02 DIAGNOSIS — I2510 Atherosclerotic heart disease of native coronary artery without angina pectoris: Secondary | ICD-10-CM | POA: Diagnosis not present

## 2021-09-02 DIAGNOSIS — I10 Essential (primary) hypertension: Secondary | ICD-10-CM | POA: Insufficient documentation

## 2021-09-02 DIAGNOSIS — K31819 Angiodysplasia of stomach and duodenum without bleeding: Secondary | ICD-10-CM | POA: Insufficient documentation

## 2021-09-02 DIAGNOSIS — Z7984 Long term (current) use of oral hypoglycemic drugs: Secondary | ICD-10-CM | POA: Diagnosis not present

## 2021-09-02 DIAGNOSIS — K219 Gastro-esophageal reflux disease without esophagitis: Secondary | ICD-10-CM | POA: Insufficient documentation

## 2021-09-02 DIAGNOSIS — E785 Hyperlipidemia, unspecified: Secondary | ICD-10-CM | POA: Diagnosis not present

## 2021-09-02 DIAGNOSIS — K269 Duodenal ulcer, unspecified as acute or chronic, without hemorrhage or perforation: Secondary | ICD-10-CM | POA: Diagnosis not present

## 2021-09-02 DIAGNOSIS — E119 Type 2 diabetes mellitus without complications: Secondary | ICD-10-CM | POA: Insufficient documentation

## 2021-09-02 DIAGNOSIS — K209 Esophagitis, unspecified without bleeding: Secondary | ICD-10-CM | POA: Diagnosis not present

## 2021-09-02 DIAGNOSIS — Z86010 Personal history of colonic polyps: Secondary | ICD-10-CM

## 2021-09-02 HISTORY — PX: COLONOSCOPY WITH PROPOFOL: SHX5780

## 2021-09-02 HISTORY — PX: HOT HEMOSTASIS: SHX5433

## 2021-09-02 HISTORY — PX: BIOPSY: SHX5522

## 2021-09-02 HISTORY — PX: ESOPHAGOGASTRODUODENOSCOPY (EGD) WITH PROPOFOL: SHX5813

## 2021-09-02 HISTORY — PX: SUBMUCOSAL INJECTION: SHX5543

## 2021-09-02 HISTORY — PX: HEMOSTASIS CLIP PLACEMENT: SHX6857

## 2021-09-02 HISTORY — PX: ENDOSCOPIC MUCOSAL RESECTION: SHX6839

## 2021-09-02 LAB — GLUCOSE, CAPILLARY: Glucose-Capillary: 97 mg/dL (ref 70–99)

## 2021-09-02 SURGERY — COLONOSCOPY WITH PROPOFOL
Anesthesia: Monitor Anesthesia Care

## 2021-09-02 MED ORDER — GLYCOPYRROLATE PF 0.2 MG/ML IJ SOSY
PREFILLED_SYRINGE | INTRAMUSCULAR | Status: DC | PRN
  Administered 2021-09-02: .2 mg via INTRAVENOUS

## 2021-09-02 MED ORDER — PROPOFOL 10 MG/ML IV BOLUS
INTRAVENOUS | Status: DC | PRN
  Administered 2021-09-02: 100 mg via INTRAVENOUS

## 2021-09-02 MED ORDER — LACTATED RINGERS IV SOLN
INTRAVENOUS | Status: DC | PRN
Start: 2021-09-02 — End: 2021-09-02

## 2021-09-02 MED ORDER — EPHEDRINE SULFATE-NACL 50-0.9 MG/10ML-% IV SOSY
PREFILLED_SYRINGE | INTRAVENOUS | Status: DC | PRN
  Administered 2021-09-02 (×2): 10 mg via INTRAVENOUS

## 2021-09-02 MED ORDER — PROPOFOL 1000 MG/100ML IV EMUL
INTRAVENOUS | Status: AC
  Filled 2021-09-02: qty 300

## 2021-09-02 MED ORDER — ONDANSETRON HCL 4 MG/2ML IJ SOLN
INTRAMUSCULAR | Status: DC | PRN
  Administered 2021-09-02: 4 mg via INTRAVENOUS

## 2021-09-02 MED ORDER — CLOPIDOGREL BISULFATE 75 MG PO TABS: 75.0000 mg | ORAL_TABLET | Freq: Every day | ORAL | 1 refills | Status: DC

## 2021-09-02 MED ORDER — LIDOCAINE 2% (20 MG/ML) 5 ML SYRINGE
INTRAMUSCULAR | Status: DC | PRN
  Administered 2021-09-02: 100 mg via INTRAVENOUS

## 2021-09-02 MED ORDER — PHENYLEPHRINE HCL-NACL 20-0.9 MG/250ML-% IV SOLN
INTRAVENOUS | Status: DC | PRN
  Administered 2021-09-02: 50 ug/min via INTRAVENOUS

## 2021-09-02 MED ORDER — PHENYLEPHRINE 40 MCG/ML (10ML) SYRINGE FOR IV PUSH (FOR BLOOD PRESSURE SUPPORT)
PREFILLED_SYRINGE | INTRAVENOUS | Status: DC | PRN
  Administered 2021-09-02 (×5): 80 ug via INTRAVENOUS

## 2021-09-02 MED ORDER — SODIUM CHLORIDE 0.9 % IV SOLN: INTRAVENOUS | Status: DC

## 2021-09-02 MED ORDER — PROPOFOL 500 MG/50ML IV EMUL
INTRAVENOUS | Status: DC | PRN
  Administered 2021-09-02: 250 ug/kg/min via INTRAVENOUS

## 2021-09-02 SURGICAL SUPPLY — 25 items

## 2021-09-02 NOTE — Interval H&P Note (Signed)
History and Physical Interval Note: ? ?09/02/2021 ?9:45 AM ? ?[name] [name]  has presented today for surgery, with the diagnosis of colon polyp.  The various methods of treatment have been discussed with the patient and family. After consideration of risks, benefits and other options for treatment, the patient has consented to  Procedure(s): ?COLONOSCOPY WITH PROPOFOL (N/A) ?ESOPHAGOGASTRODUODENOSCOPY (EGD) WITH PROPOFOL (N/A) as a surgical intervention.  The patient's history has been reviewed, patient examined, no change in status, stable for surgery.  I have reviewed the patient's chart and labs.  Questions were answered to the patient's satisfaction.   ? ?EGD added due to persistence of iron deficiency and follow up previous ulcer disease. ? ? ?[name] ? ? ?

## 2021-09-02 NOTE — Anesthesia Postprocedure Evaluation (Signed)
Anesthesia Post Note ? ?Patient: [name] [name] ? ?Procedure(s) Performed: COLONOSCOPY WITH PROPOFOL ?ESOPHAGOGASTRODUODENOSCOPY (EGD) WITH PROPOFOL ?BIOPSY ?HEMOSTASIS CLIP PLACEMENT ?SUBMUCOSAL INJECTION ?ENDOSCOPIC MUCOSAL RESECTION ?HOT HEMOSTASIS (ARGON PLASMA COAGULATION/BICAP) ? ?  ? ?Patient location during evaluation: PACU ?Anesthesia Type: MAC ?Level of consciousness: awake and alert ?Pain management: pain level controlled ?Vital Signs Assessment: post-procedure vital signs reviewed and stable ?Respiratory status: spontaneous breathing, nonlabored ventilation and respiratory function stable ?Cardiovascular status: blood pressure returned to baseline and stable ?Postop Assessment: no apparent nausea or vomiting ?Anesthetic complications: no ? ? ?No notable events documented. ? ?Last Vitals:  ?Vitals:  ? 09/02/21 0940 09/02/21 1405  ?BP: (!) 156/79 (!) 172/75  ?Pulse: 63 88  ?Resp: 14 16  ?Temp: (!) 36.4 ?C 36.4 ?C  ?SpO2: 99% 99%  ?  ?Last Pain:  ?Vitals:  ? 09/02/21 1405  ?TempSrc:   ?PainSc: 0-No pain  ? ? ?  ?  ?  ?  ?  ?  ? ?Lynda Rainwater ? ? ? ? ?

## 2021-09-02 NOTE — Anesthesia Preprocedure Evaluation (Addendum)
Anesthesia Evaluation  ?Patient identified by MRN, date of birth, ID band ?Patient awake ? ? ? ?Reviewed: ?Allergy & Precautions, NPO status , Patient's Chart, lab work & pertinent test results, reviewed documented beta blocker date and time  ? ?Airway ?Mallampati: II ? ?TM Distance: >3 FB ?Neck ROM: Full ? ? ? Dental ?no notable dental hx. ?(+) Teeth Intact, Dental Advisory Given ?  ?Pulmonary ?neg pulmonary ROS,  ?  ?Pulmonary exam normal ?breath sounds clear to auscultation ? ? ? ? ? ? Cardiovascular ?hypertension, Pt. on home beta blockers and Pt. on medications ?+ CAD and + Cardiac Stents  ?Normal cardiovascular exam ?Rhythm:Regular Rate:Normal ? ?Stress Test 2022 ?The left ventricular ejection fraction is mildly decreased (45-54%). ?Nuclear stress EF: 49%. Mid anterior wall hypokinesis. ?There was no ST segment deviation noted during stress. ?Defect 1: There is a medium defect of severe severity present in the mid anterior location. ?Findings consistent with prior myocardial infarction. ?This is an intermediate risk study. Old infarct pattern noted in the mid inferior wall distribution. Prior LAD stent placed. No ischemia identified. ? ?  ?Neuro/Psych ?negative neurological ROS ? negative psych ROS  ? GI/Hepatic ?Neg liver ROS, hiatal hernia, PUD, GERD  ,  ?Endo/Other  ?diabetes, Type 2, Oral Hypoglycemic Agents ? Renal/GU ?negative Renal ROS  ?negative genitourinary ?  ?Musculoskeletal ?negative musculoskeletal ROS ?(+)  ? Abdominal ?  ?Peds ? Hematology ? ?(+) Blood dyscrasia (on plavix), ,   ?Anesthesia Other Findings ? ? Reproductive/Obstetrics ? ?  ? ? ? ? ? ? ? ? ? ? ? ? ? ?  ?  ? ? ? ? ? ? ? ?Anesthesia Physical ?Anesthesia Plan ? ?ASA: 3 ? ?Anesthesia Plan: MAC  ? ?Post-op Pain Management:   ? ?Induction: Intravenous ? ?PONV Risk Score and Plan: Propofol infusion and Treatment may vary due to age or medical condition ? ?Airway Management Planned: Natural  Airway ? ?Additional Equipment:  ? ?Intra-op Plan:  ? ?Post-operative Plan:  ? ?Informed Consent: I have reviewed the patients History and Physical, chart, labs and discussed the procedure including the risks, benefits and alternatives for the proposed anesthesia with the patient or authorized representative who has indicated his/her understanding and acceptance.  ? ? ? ?Dental advisory given ? ?Plan Discussed with: CRNA ? ?Anesthesia Plan Comments:   ? ? ? ? ? ? ?Anesthesia Quick Evaluation ? ?

## 2021-09-02 NOTE — Op Note (Signed)
[facility] Patient Name: [name] [name] Procedure Date : 09/02/2021 MRN: [id number] Attending MD: Justice Britain , MD Date of Birth: May 30, 1951 CSN: [id number] Age: 71 Admit Type: Outpatient Procedure:                Colonoscopy Indications:              Excision of colonic polyp Providers:                Justice Britain, MD, Vista Lawman, RN, Despina Pole, Technician, Tyna Jaksch Technician Referring MD:             Gatha Mayer, MD Medicines:                Monitored Anesthesia Care Complications:            No immediate complications. Estimated Blood Loss:     Estimated blood loss was minimal. Procedure:                Pre-Anesthesia Assessment:                           - Prior to the procedure, a History and Physical                            was performed, and patient medications and                            allergies were reviewed. The patient's tolerance of                            previous anesthesia was also reviewed. The risks                            and benefits of the procedure and the sedation                            options and risks were discussed with the patient.                            All questions were answered, and informed consent                            was obtained. Prior Anticoagulants: The patient has                            taken Plavix (clopidogrel), last dose was 5 days                            prior to procedure. ASA Grade Assessment: III - A                            patient with severe systemic disease. After  reviewing the risks and benefits, the patient was                            deemed in satisfactory condition to undergo the                            procedure.                           After obtaining informed consent, the colonoscope                            was passed under direct vision. Throughout the                             procedure, the patient's blood pressure, pulse, and                            oxygen saturations were monitored continuously. The                            CF-HQ190L ([id number] Olympus coloscope was                            introduced through the anus and advanced to the 5                            cm into the ileum. The colonoscopy was extremely                            difficult. Successful completion of the procedure                            was aided by performing the maneuvers documented                            (below) in this report. The patient tolerated the                            procedure. The quality of the bowel preparation was                            good. The terminal ileum, ileocecal valve,                            appendiceal orifice, and rectum were photographed. Scope In: 11:45:46 AM Scope Out: 1:51:17 PM Total Procedure Duration: 2 hours 5 minutes 31 seconds  Findings:      The digital rectal exam findings include hemorrhoids. Pertinent       negatives include no palpable rectal lesions.      The terminal ileum and ileocecal valve appeared normal.      A greater than 60 mm polyp was found in the proximal ascending colon.       Tattoo was noted proximal to this. The polyp was  mixed lateral spreading       and spread over 2 folds. Preparations were made for attempt at mucosal       resection. NBI imaging and White-light endoscopy was done to demarcate       the borders of the lesion. Endolift was injected to raise the lesion and       periodically also done throughout the procedure (25 mL was used).       Piecemeal mucosal resection using a snare was performed. I transitioned       from large snare to smaller snare and then back. Resection and retrieval       were complete. Fulguration to ablate the margin by snare tip soft       cautery was successful. The defect is too large to close completely       (covering more than 60% of the wall over 2 folds)  to prevent bleeding       after mucosal resection, four hemostatic clips were successfully placed       in the areas felt to have the higher risk of bleeding. There was no       bleeding at the end of the procedure.      Two sessile polyps were found in the proximal ascending colon. The       polyps were 3 to 5 mm in size. These polyps were removed with a cold       snare. Resection and retrieval were complete. These 2 were placed in the       same jar as they were quite close to one another.      Normal mucosa was found in the entire colon grossly.      Non-bleeding non-thrombosed external and internal hemorrhoids were found       during perianal exam and during digital exam. The hemorrhoids were Grade       II (internal hemorrhoids that prolapse but reduce spontaneously). Impression:               - Hemorrhoids found on digital rectal exam.                           - The examined portion of the ileum was normal.                           - One greater than 60 mm polyp in the proximal                            ascending colon, removed with piecemeal mucosal                            resection. Resected and retrieved. Treated with                            STSC to margin. Clips were placed but could not                            close the entire defect.                           - Two 3 to 5 mm polyps in the proximal ascending  colon, removed with a cold snare. Resected and                            retrieved. Placed in the same jar as other since                            they were so close to one another.                           - Normal mucosa in the entire examined colon                            grossly otherwise.                           - Non-bleeding non-thrombosed external and internal                            hemorrhoids. Recommendation:           - The patient will be observed post-procedure,                            until all discharge  criteria are met.                           - Discharge patient to home.                           - Patient has a contact number available for                            emergencies. The signs and symptoms of potential                            delayed complications were discussed with the                            patient. Return to normal activities tomorrow.                            Written discharge instructions were provided to the                            patient.                           - Resume previous diet.                           - No aspirin, ibuprofen, naproxen, or other                            non-steroidal anti-inflammatory drugs for 2 weeks  after polyp removal.                           - May restart Plavix in 72 hours - 3/12 PM or 3/13                            AM (depending on timing of Plavix administration                            normally AM v PM) to decrease risk of                            post-interventional bleeding.                           - Monitor for signs/symptoms of bleeding,                            perforation, and infection. If issues please call                            our number to get further assistance as needed.                           - Await pathology results.                           - Repeat colonoscopy in 6 months for surveillance.                           - The findings and recommendations were discussed                            with the patient.                           - The findings and recommendations were discussed                            with the patient's family. Procedure Code(s):        --- Professional ---                           [phone], Colonoscopy, flexible; with endoscopic                            mucosal resection                           45385, 40, Colonoscopy, flexible; with removal of                            tumor(s), polyp(s), or other lesion(s) by snare                             technique Diagnosis  Code(s):        --- Professional ---                           K64.1, Second degree hemorrhoids                           K63.5, Polyp of colon CPT copyright 2019 American Medical Association. All rights reserved. The codes documented in this report are preliminary and upon coder review may  be revised to meet current compliance requirements. Justice Britain, MD 09/02/2021 2:05:46 PM Number of Addenda: 0

## 2021-09-02 NOTE — Op Note (Signed)
[facility] ?Patient Name: [name] [name] ?Procedure Date : 09/02/2021 ?MRN: [id number] ?Attending MD: Justice Britain , MD ?Date of Birth: [birth year] ?CSN: [id number] ?Age: 71 ?Admit Type: Outpatient ?Procedure:                Upper GI endoscopy ?Indications:              Iron deficiency anemia, Follow-up of duodenal ulcer ?Providers:                Justice Britain, MD, Vista Lawman, RN, Charlean Merl  ?                          Purcell Nails, Technician, Tyna Jaksch Technician ?Referring MD:             Gatha Mayer, MD ?Medicines:                Monitored Anesthesia Care ?Complications:            No immediate complications. ?Estimated Blood Loss:     Estimated blood loss was minimal. ?Procedure:                Pre-Anesthesia Assessment: ?                          - Prior to the procedure, a History and Physical  ?                          was performed, and patient medications and  ?                          allergies were reviewed. The patient's tolerance of  ?                          previous anesthesia was also reviewed. The risks  ?                          and benefits of the procedure and the sedation  ?                          options and risks were discussed with the patient.  ?                          All questions were answered, and informed consent  ?                          was obtained. Prior Anticoagulants: The patient has  ?                          taken Plavix (clopidogrel), last dose was 5 days  ?                          prior to procedure. ASA Grade Assessment: III - A  ?                          patient with severe systemic disease. After  ?  reviewing the risks and benefits, the patient was  ?                          deemed in satisfactory condition to undergo the  ?                          procedure. ?                          After obtaining informed consent, the endoscope was  ?                          passed under direct vision. Throughout the  ?                           procedure, the patient's blood pressure, pulse, and  ?                          oxygen saturations were monitored continuously. The  ?                          GIF-H190 ([id number]) Olympus endoscope was introduced  ?                          through the mouth, and advanced to the second part  ?                          of duodenum. The upper GI endoscopy was  ?                          accomplished without difficulty. The patient  ?                          tolerated the procedure. ?Scope In: ?Scope Out: ?Findings: ?     White nummular lesions were noted in the entire esophagus. Biopsies were  ?     taken with a cold forceps for histology. ?     The Z-line was irregular and was found 39 cm from the incisors. ?     A single small angioectasia with bleeding was found at the  ?     gastroesophageal junction. Fulguration to ablate the lesion by argon  ?     plasma was successful though it did lead to some oozing after initial  ?     coagulation. Additional coagulation performed. For ensured hemostasis,  ?     one hemostatic clip was successfully placed (MR conditional). There was  ?     no bleeding at the end of the procedure. ?     No gross lesions were noted in the entire examined stomach. ?     One non-bleeding linear duodenal ulcer with a clean ulcer base (Forrest  ?     Class III) was found in the duodenal bulb. The lesion was 15 mm in  ?     largest dimension. ?     Patchy moderately erythematous mucosa without active bleeding and with  ?     no stigmata of bleeding was found in the duodenal bulb,  in the first  ?     portion of the duodenum and in the second portion of the duodenum. ?Impression:               - White nummular lesions in esophageal mucosa.  ?                          Biopsied. ?                          - Z-line irregular, 39 cm from the incisors. ?                          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  ?                          Treated with argon plasma  coagulation (APC). Clip  ?                          (MR conditional) was placed. ?                          - No gross lesions in the stomach. ?                          - Non-bleeding duodenal ulcer with a clean ulcer  ?                          base (Forrest Class III). ?                          - Erythematous duodenopathy. ?Recommendation:           - Proceed to scheduled colonoscopy. ?                          - Continue PPI once daily. ?                          - Observe patient's clinical course. ?                          - Await pathology results. ?                          - Soft diet today and then may advance. ?                          - The findings and recommendations were discussed  ?                          with the patient. ?                          - The findings and recommendations were discussed  ?                          with the patient's  family. ?Procedure Code(s):        --- Professional --- ?                          [phone], Esophagogastroduodenoscopy, flexible,  ?                          transoral; with biopsy, single or multiple ?Diagnosis Code(s):        --- Professional --- ?                          K22.8, Other specified diseases of esophagus ?                          K31.811, Angiodysplasia of stomach and duodenum  ?                          with bleeding ?                          K26.9, Duodenal ulcer, unspecified as acute or  ?                          chronic, without hemorrhage or perforation ?                          K31.89, Other diseases of stomach and duodenum ?                          D50.9, Iron deficiency anemia, unspecified ?CPT copyright 2019 American Medical Association. All rights reserved. ?The codes documented in this report are preliminary and upon coder review may  ?be revised to meet current compliance requirements. ?Justice Britain, MD ?09/02/2021 2:11:04 PM ?Number of Addenda: 0 ?

## 2021-09-02 NOTE — Transfer of Care (Signed)
Immediate Anesthesia Transfer of Care Note ? ?Patient: [name] [name] ? ?Procedure(s) Performed: COLONOSCOPY WITH PROPOFOL ?ESOPHAGOGASTRODUODENOSCOPY (EGD) WITH PROPOFOL ?BIOPSY ?HEMOSTASIS CLIP PLACEMENT ?SUBMUCOSAL INJECTION ?ENDOSCOPIC MUCOSAL RESECTION ? ?Patient Location: PACU ? ?Anesthesia Type:MAC ? ?Level of Consciousness: awake, alert  and oriented ? ?Airway & Oxygen Therapy: Patient Spontanous Breathing and Patient connected to nasal cannula oxygen ? ?Post-op Assessment: Report given to RN, Post -op Vital signs reviewed and stable and Patient moving all extremities ? ?Post vital signs: Reviewed and stable ? ?Last Vitals:  ?Vitals Value Taken Time  ?BP    ?Temp    ?Pulse    ?Resp    ?SpO2    ? ? ?Last Pain:  ?Vitals:  ? 09/02/21 0940  ?TempSrc: Temporal  ?PainSc: 0-No pain  ?   ? ?  ? ?Complications: No notable events documented. ?

## 2021-09-06 ENCOUNTER — Other Ambulatory Visit: Payer: Self-pay | Admitting: Family Medicine

## 2021-09-06 ENCOUNTER — Encounter: Payer: Self-pay | Admitting: Gastroenterology

## 2021-09-07 ENCOUNTER — Telehealth: Payer: Self-pay

## 2021-09-07 NOTE — Telephone Encounter (Signed)
Pt was scheduled for an office Visit on 11/10/2021 at 9:30 with Dr. Carlean Purl:  ?Pt verbalized understanding with all questions answered.  ? ?

## 2021-09-07 NOTE — Telephone Encounter (Signed)
-----   Message from Gatha Mayer, MD sent at 09/07/2021  1:40 PM EDT ----- ?Regarding: f/u GERD ?Remo Lipps, ? ?Please make this man an appointment to see me in 2 mos re: GERD ? ?Thanks [name]! ? ?CEG ?----- Message ----- ?From: Carl Best, RN ?Sent: 09/07/2021  11:10 AM EDT ?To: Gatha Mayer, MD, Vivi Barrack, MD ? ? ?

## 2021-09-08 ENCOUNTER — Telehealth: Payer: Self-pay

## 2021-09-08 ENCOUNTER — Encounter: Payer: Self-pay | Admitting: Family Medicine

## 2021-09-08 ENCOUNTER — Ambulatory Visit (INDEPENDENT_AMBULATORY_CARE_PROVIDER_SITE_OTHER): Payer: Medicare Other | Admitting: Family Medicine

## 2021-09-08 VITALS — BP 139/83 | HR 65 | Temp 97.6°F | Ht 69.0 in | Wt 175.0 lb

## 2021-09-08 DIAGNOSIS — K2100 Gastro-esophageal reflux disease with esophagitis, without bleeding: Secondary | ICD-10-CM | POA: Diagnosis not present

## 2021-09-08 DIAGNOSIS — E1159 Type 2 diabetes mellitus with other circulatory complications: Secondary | ICD-10-CM

## 2021-09-08 DIAGNOSIS — E1165 Type 2 diabetes mellitus with hyperglycemia: Secondary | ICD-10-CM

## 2021-09-08 DIAGNOSIS — E1169 Type 2 diabetes mellitus with other specified complication: Secondary | ICD-10-CM | POA: Diagnosis not present

## 2021-09-08 DIAGNOSIS — I152 Hypertension secondary to endocrine disorders: Secondary | ICD-10-CM | POA: Diagnosis not present

## 2021-09-08 DIAGNOSIS — D509 Iron deficiency anemia, unspecified: Secondary | ICD-10-CM | POA: Diagnosis not present

## 2021-09-08 DIAGNOSIS — E785 Hyperlipidemia, unspecified: Secondary | ICD-10-CM | POA: Diagnosis not present

## 2021-09-08 LAB — POCT GLYCOSYLATED HEMOGLOBIN (HGB A1C): Hemoglobin A1C: 6.8 % — AB (ref 4.0–5.6)

## 2021-09-08 LAB — SURGICAL PATHOLOGY

## 2021-09-08 MED ORDER — DEXLANSOPRAZOLE 60 MG PO CPDR: 60.0000 mg | DELAYED_RELEASE_CAPSULE | Freq: Every day | ORAL | 0 refills | Status: DC

## 2021-09-08 NOTE — Patient Instructions (Addendum)
It was very nice to see you today! ? ?Please start an iron supplement '65mg'$  every other day on an empty stomach with vitamin C. We can recheck again in few months. ? ?Please try the dexilant to see if this helps with the itching.  ? ?Your A1c is 6.8. We will continue your current dose of metformin.  ? ?Please come back in 6 months. Come back sooner if needed.  ? ?Take care, ?Dr [name] ? ?PLEASE NOTE: ? ?If you had any lab tests please let us know if you have not heard back within a few days. You may see your results on mychart before we have a chance to review them but we will give you a call once they are reviewed by [place]. If we ordered any referrals today, please let us know if you have not heard from their office within the next week.  ? ?Please try these tips to maintain a healthy lifestyle: ? ?Eat at least 3 REAL meals and 1-2 snacks per day.  Aim for no more than 5 hours between eating.  If you eat breakfast, please do so within one hour of getting up.  ? ?Each meal should contain half fruits/vegetables, one quarter protein, and one quarter carbs (no bigger than a computer mouse) ? ?Cut down on sweet beverages. This includes juice, soda, and sweet tea.  ? ?Drink at least 1 glass of water with each meal and aim for at least 8 glasses per day ? ?Exercise at least 150 minutes every week.   ?

## 2021-09-08 NOTE — Progress Notes (Signed)
? ?  [name] [name] is a 71 y.o. male who presents today for an office visit. ? ?Assessment/Plan:  ?Chronic Problems Addressed Today: ?GERD ?Is not clear that his itching is due to blood products however we will switch to see if he tolerates an alternative PPI better.  We will start [place]. ? ?Hypertension associated with diabetes ([place]) ?At goal on Coreg 3.125 mg twice daily, irbesartan 300 mg daily, and spironolactone 25 mg daily. ? ?T2DM (type 2 diabetes mellitus) ([place]) ?A1c at goal at 6.8. Continue metformin '1000mg'$  daily. Recheck in 6 months.  ? ?IDA (iron deficiency anemia) ?Likely due GI losses. Has been following with GI. On PPI. He will start iron supplement. We can recheck in 3-6 months.  ? ?  ?Subjective:  ?HPI: ? ?Patient here for follow up. He was last seen about a month ago for a virtual visit for covid. He has recovered from that well. Since our last visit he has had continued issues with GI bleeding and underwent upper endoscopy and colonoscopy last week.  He was found to have some ulcerations in his duodenum as well as white annular lesions in his esophagus.  He also was found to have some polyps that were resected.  He has follow-up with GI in a few months. ? ?He is currently on Protonix 40 mg daily.  He is concerned this is causing some itching.  Would like to switch to alternative PPI possible. ? ?   ?  ?Objective:  ?Physical Exam: ?BP 139/83   Pulse 65   Temp 97.6 ?F (36.4 ?C)   Ht '5\' 9"'$  (1.753 m)   Wt 175 lb (79.4 kg)   SpO2 100%   BMI 25.84 kg/m?   ?Gen: No acute distress, resting comfortably ?Neuro: Grossly normal, moves all extremities ?Psych: Normal affect and thought content ? ?Time Spent: ?45 minutes of total time was spent on the date of the encounter performing the following actions: chart review prior to seeing the patient including recent procedures and visits with specialists, obtaining history, performing a medically necessary exam, counseling on the treatment plan, placing  orders, and documenting in our EHR.  ? ? ?   ? ?Algis Greenhouse. [name], MD ?09/08/2021 10:19 AM  ?

## 2021-09-08 NOTE — Assessment & Plan Note (Signed)
Likely due GI losses. Has been following with GI. On PPI. He will start iron supplement. We can recheck in 3-6 months.  ?

## 2021-09-08 NOTE — Telephone Encounter (Signed)
Please advise 

## 2021-09-08 NOTE — Telephone Encounter (Signed)
Patient was scheduled for 10/04/21 with Gerald Stabs unsure why it was cancelled. Patient would like a call back to reschedule appointment.  ?

## 2021-09-08 NOTE — Assessment & Plan Note (Signed)
A1c at goal at 6.8. Continue metformin '1000mg'$  daily. Recheck in 6 months.  ?

## 2021-09-08 NOTE — Assessment & Plan Note (Signed)
At goal on Coreg 3.125 mg twice daily, irbesartan 300 mg daily, and spironolactone 25 mg daily. ?

## 2021-09-08 NOTE — Assessment & Plan Note (Signed)
Is not clear that his itching is due to blood products however we will switch to see if he tolerates an alternative PPI better.  We will start [place]. ?

## 2021-09-09 NOTE — Telephone Encounter (Signed)
There are several medications we could try but recommend he check with his insurance to see if they have any preferred alternatives first. ? ?[name] [name]. [name], MD ?09/09/2021 8:31 AM  ? ?

## 2021-09-09 NOTE — Telephone Encounter (Signed)
Called pt and verified DOB, advised pt of contacting insurance for alternatives. Pt going to call insurance and let us know  ?

## 2021-09-10 ENCOUNTER — Telehealth: Payer: Self-pay | Admitting: Gastroenterology

## 2021-09-10 NOTE — Telephone Encounter (Signed)
Inbound call from patient had procedure 3/9. States today 3/17 is the first he have noticed blood in stool. Had also been itching on both sides before bed and while sleeping from armpit to hips. Reports no rash, just itching. Best contact number [phone] ?

## 2021-09-10 NOTE — Telephone Encounter (Signed)
The pt called in with a complaint of a single episode of BRBPR with a BM.  His stools are soft, and regular for him.  He does have a hx of hemorrhoids that was seen on colon from 3/9.  He has had bowel movements since the episode of BRB with no blood noted. He is going to watch and monitor if the bleeding returns and call back.  He was advised to keep the bowels soft and use Prep H if hemorrhoids are bothersome.  He also would like to know if fungus is not the cause of the "white patches" seen on recent EGD what could be the cause?  He also has noticed that for the past 3 weeks he has had itching from his armpits to the hip area at night that is very bothersome.  I advised that he contact his PCP and make them aware and see what recommendations they have. I will also send to Dr Rush Landmark for review in case he believes there is a GI cause.  He has not started any new meds, changed soaps, deodorant, etc.  He has a follow up with primary GI Dr Carlean Purl on 5/17.  Please advise  ?

## 2021-09-11 NOTE — Telephone Encounter (Signed)
Agree with your plan of action and if things worsen for him to call back sooner. ?The white specks are most likely dried saliva otherwise.  Nothing to worry about. ?Please reach out to patient on Monday to reevaluate things. ?Thanks. ?GM ?

## 2021-09-13 NOTE — Telephone Encounter (Signed)
The pt has been advised of the recommendations per Dr Rush Landmark.  He states the bleeding has gotten better.  He will call if things do not continue to improve.  ?

## 2021-10-04 ENCOUNTER — Telehealth: Payer: Medicare Other

## 2021-10-14 NOTE — Progress Notes (Signed)
? ?LAYN KYE ?Date of Birth: September 20, 1950 ?Medical Record #[id number] ? ?History of Present Illness: ?Zadin is seen for cardiac followup CAD. He has a history of coronary disease diagnosed by cardiac CTA in 2007. In September 2015 he presented with chest pain. Myoview study demonstrated anterior wall ischemia. Subsequent cardiac cath demonstrated severe disease in the mid LAD and first diagonal with 50-70% disease in the OM2. On 04/01/14 he had successful stenting of the LAD and diagonal with DES. FFR of the OM was normal. Despite aggressive pretreatment for dye allergy with steroids, pepcid, and benadryl he still developed hives and diarrhea post procedure.  ? ?He was seen last year for pre op clearance for knee surgery. Myoview study was done and was low risk. Some anterior wall scar without ischemia. EF 49%.  ? ?He did have right TKR on 05/11/21. He was admitted in Dec 2022 with weakness and melena. Plavix was held, patient underwent endoscopy on 12/10 which showed clean-based ulcer.  It was recommended to hold Plavix until seen by outpatient GI during follow-up in the meantime continue PPI twice daily.  Hemoglobin remained stable  after transfusion.  ? ?He underwent repeat EGD in March 2023 showing a bleeding angiodysplastic lesion that was treated. He had a clean based duodenal ulcer and some duodenitis. He also had colonoscopy with removal of one large 60 mm nodule and 2 small sessile polyps. Planning for repeat endoscopy in one month. ? ?He denies any cardiac issues. No chest pain or dyspnea. Energy level is much better since Hgb improved. No real complaints today. ? ?Current Outpatient Medications on File Prior to Visit  ?Medication Sig Dispense Refill  ? Accu-Chek Softclix Lancets lancets 1 each by Other route 3 (three) times daily. 102 each 11  ? atorvastatin (LIPITOR) 80 MG tablet TAKE 1 TABLET DAILY AT     6:00PM 90 tablet 0  ? Blood Glucose Monitoring Suppl (ACCU-CHEK AVIVA PLUS) w/Device KIT Check  glucose TID or as needed E11.9 1 kit 0  ? carvedilol (COREG) 3.125 MG tablet TAKE 1 TABLET TWICE DAILY  WITH MEALS 180 tablet 0  ? clopidogrel (PLAVIX) 75 MG tablet Take 1 tablet (75 mg total) by mouth daily. 90 tablet 1  ? Coenzyme Q10 200 MG capsule Take 200 mg by mouth daily.    ? dexlansoprazole (DEXILANT) 60 MG capsule Take 1 capsule (60 mg total) by mouth daily. 30 capsule 0  ? famotidine-calcium carbonate-magnesium hydroxide (PEPCID COMPLETE) 10-800-165 MG chewable tablet Chew 0.5 tablets by mouth every 6 (six) hours as needed (heartburn).    ? glucose blood test strip Use as instructed 100 each 12  ? Homeopathic Products (ZICAM ALLERGY RELIEF NA) Place 1 spray into the nose daily as needed (colds).    ? irbesartan (AVAPRO) 300 MG tablet TAKE 1 TABLET DAILY 90 tablet 0  ? metFORMIN (GLUCOPHAGE-XR) 500 MG 24 hr tablet TAKE 2 TABLETS DAILY AFTER SUPPER 180 tablet 3  ? Multiple Vitamins-Minerals (MULTIVITAMIN WITH MINERALS) tablet Take 1 tablet by mouth daily.    ? nitroGLYCERIN (NITROSTAT) 0.4 MG SL tablet Place 1 tablet (0.4 mg total) under the tongue every 5 (five) minutes as needed for chest pain. 100 tablet 1  ? sildenafil (REVATIO) 20 MG tablet Take 1-5 tablets by mouth daily as needed. 100 tablet 0  ? sodium chloride (OCEAN) 0.65 % SOLN nasal spray Place 1 spray into both nostrils as needed for congestion.    ? spironolactone (ALDACTONE) 25 MG tablet TAKE 1 TABLET DAILY  90 tablet 0  ? Undecylenic Ac-Zn Undecylenate (FUNGI-NAIL TOE & FOOT EX) Apply 1 application topically See admin instructions. Apply to bilateral big toes every other day    ? vitamin B-12 (CYANOCOBALAMIN) 1000 MCG tablet Take 1,000 mcg by mouth daily.    ? ?No current facility-administered medications on file prior to visit.  ? ? ?Allergies  ?Allergen Reactions  ? Aspirin Hives, Swelling and Other (See Comments)  ?  Angioedema  ? Oxycodone Nausea Only  ? Contrast Media [Iodinated Contrast Media] Hives, Itching, Rash and Other (See  Comments)  ?  Developed hives despite pre-treatment  ? Norvasc [Amlodipine Besylate] Other (See Comments) and Cough  ?  09/12/2013 gingival hyperplasia , Dr Geralynn Ochs ,DDS  ? ? ?Past Medical History:  ?Diagnosis Date  ? Adenomatous colon polyp 09/07/2004  ? Amblyopia   ? CAD (coronary artery disease)   ? cardiac CT with a 50% LAD lesion in 2007  ? Diverticulosis   ? DM2 (diabetes mellitus, type 2) ([place])   ? Elevated LFTs   ? secondary to labetalol  ? Esophageal stricture 2003  ? External hemorrhoids 2012  ? lanced by Dr Rise Patience  ? Family history of anesthesia complication   ? "sister had PONV"  ? GERD (gastroesophageal reflux disease)   ? esophageal stricture, pmh  ? Hiatal hernia 01/04/2000  ? History of kidney stones 2022  ? HLD (hyperlipidemia)   ? HTN (hypertension)   ? Hypopotassemia   ? ? ?Past Surgical History:  ?Procedure Laterality Date  ? BIOPSY  06/05/2021  ? Procedure: BIOPSY;  Surgeon: Gatha Mayer, MD;  Location: [facility];  Service: Endoscopy;;  ? BIOPSY  09/02/2021  ? Procedure: BIOPSY;  Surgeon: [name]., MD;  Location: [place];  Service: Gastroenterology;;  ? COLONOSCOPY  06/27/2004  ? tics , ADENOMATOUS polyps. F/U due 2011  ? COLONOSCOPY  06/27/2010  ? Tics  ? COLONOSCOPY WITH PROPOFOL N/A 09/02/2021  ? Procedure: COLONOSCOPY WITH PROPOFOL;  Surgeon: Mansouraty, Telford Nab., MD;  Location: [place];  Service: Gastroenterology;  Laterality: N/A;  ? CORONARY ANGIOPLASTY WITH STENT PLACEMENT  04/01/2014  ? "2"  ? ENDOSCOPIC MUCOSAL RESECTION  09/02/2021  ? Procedure: ENDOSCOPIC MUCOSAL RESECTION;  Surgeon: Rush Landmark Telford Nab., MD;  Location: [place];  Service: Gastroenterology;;  ? ESOPHAGEAL DILATION  06/27/2001  ? ESOPHAGOGASTRODUODENOSCOPY  06/27/2001  ? ERD, stricture dilated  ? ESOPHAGOGASTRODUODENOSCOPY (EGD) WITH PROPOFOL N/A 06/05/2021  ? Procedure: ESOPHAGOGASTRODUODENOSCOPY (EGD) WITH PROPOFOL;  Surgeon: Gatha Mayer, MD;  [facility];  Service:  Endoscopy;  Laterality: N/A;  ? ESOPHAGOGASTRODUODENOSCOPY (EGD) WITH PROPOFOL N/A 09/02/2021  ? Procedure: ESOPHAGOGASTRODUODENOSCOPY (EGD) WITH PROPOFOL;  Surgeon: Rush Landmark Telford Nab., MD;  Location: [place];  Service: Gastroenterology;  Laterality: N/A;  ? EXCISIONAL HEMORRHOIDECTOMY  ~ 2008  ? HEMOSTASIS CLIP PLACEMENT  09/02/2021  ? Procedure: HEMOSTASIS CLIP PLACEMENT;  Surgeon: [name]., MD;  Location: [place];  Service: Gastroenterology;;  ? HOT HEMOSTASIS N/A 09/02/2021  ? Procedure: HOT HEMOSTASIS (ARGON PLASMA COAGULATION/BICAP);  Surgeon: [name]., MD;  Location: [place];  Service: Gastroenterology;  Laterality: N/A;  ? LEFT HEART CATHETERIZATION WITH CORONARY ANGIOGRAM N/A 03/26/2014  ? Procedure: LEFT HEART CATHETERIZATION WITH CORONARY ANGIOGRAM;  Surgeon: Anam Bobby M Martinique, MD;  Location: [facility] CATH LAB;  Service: Cardiovascular;  Laterality: N/A;  ? PERCUTANEOUS CORONARY STENT INTERVENTION (PCI-S) N/A 04/01/2014  ? Procedure: PERCUTANEOUS CORONARY STENT INTERVENTION (PCI-S);  Surgeon: Nakita Santerre M Martinique, MD;  Location: [facility] CATH LAB;  Service: Cardiovascular;  Laterality: N/A;  ? POSTERIOR LAMINECTOMY / DECOMPRESSION LUMBAR SPINE  12/25/2009  ? L4 , Dr Sherwood Gambler  ? SHOULDER ARTHROSCOPY WITH ROTATOR CUFF REPAIR AND OPEN BICEPS TENODESIS Left 02/04/2019  ? Procedure: LEFT SHOULDER ARTHROSCOPY, BICEPS TENODESIS, MINI OPEN ROTATOR CUFF TEAR REPAIR;  Surgeon: Meredith Pel, MD;  Location: [place];  Service: Orthopedics;  Laterality: Left;  ? SUBMUCOSAL INJECTION  09/02/2021  ? Procedure: SUBMUCOSAL INJECTION;  Surgeon: [name]., MD;  Location: [place];  Service: Gastroenterology;;  ? TOTAL KNEE ARTHROPLASTY Right 05/11/2021  ? Procedure: TOTAL KNEE ARTHROPLASTY;  Surgeon: Paralee Cancel, MD;  Location: [facility];  Service: Orthopedics;  Laterality: Right;  ? ? ?Social History  ? ?Tobacco Use  ?Smoking Status Never  ?Smokeless Tobacco  Never  ? ? ?Social History  ? ?Substance and Sexual Activity  ?Alcohol Use Yes  ? Alcohol/week: 2.0 standard drinks  ? Types: 2 Cans of beer per week  ? Comment: social  ? ? ?Family History  ?Problem Relation Age o

## 2021-10-18 ENCOUNTER — Ambulatory Visit (INDEPENDENT_AMBULATORY_CARE_PROVIDER_SITE_OTHER): Payer: Medicare Other | Admitting: Cardiology

## 2021-10-18 ENCOUNTER — Encounter: Payer: Self-pay | Admitting: Cardiology

## 2021-10-18 VITALS — BP 150/60 | HR 56 | Ht 68.0 in | Wt 179.5 lb

## 2021-10-18 DIAGNOSIS — I10 Essential (primary) hypertension: Secondary | ICD-10-CM

## 2021-10-18 DIAGNOSIS — E7800 Pure hypercholesterolemia, unspecified: Secondary | ICD-10-CM | POA: Diagnosis not present

## 2021-10-18 DIAGNOSIS — I25118 Atherosclerotic heart disease of native coronary artery with other forms of angina pectoris: Secondary | ICD-10-CM | POA: Diagnosis not present

## 2021-11-10 ENCOUNTER — Ambulatory Visit (INDEPENDENT_AMBULATORY_CARE_PROVIDER_SITE_OTHER): Payer: Medicare Other | Admitting: Internal Medicine

## 2021-11-10 ENCOUNTER — Encounter: Payer: Self-pay | Admitting: Internal Medicine

## 2021-11-10 ENCOUNTER — Other Ambulatory Visit (INDEPENDENT_AMBULATORY_CARE_PROVIDER_SITE_OTHER): Payer: Medicare Other

## 2021-11-10 VITALS — BP 170/78 | HR 56 | Ht 68.0 in | Wt 179.6 lb

## 2021-11-10 DIAGNOSIS — R933 Abnormal findings on diagnostic imaging of other parts of digestive tract: Secondary | ICD-10-CM

## 2021-11-10 DIAGNOSIS — K279 Peptic ulcer, site unspecified, unspecified as acute or chronic, without hemorrhage or perforation: Secondary | ICD-10-CM

## 2021-11-10 DIAGNOSIS — D126 Benign neoplasm of colon, unspecified: Secondary | ICD-10-CM

## 2021-11-10 DIAGNOSIS — I25118 Atherosclerotic heart disease of native coronary artery with other forms of angina pectoris: Secondary | ICD-10-CM | POA: Diagnosis not present

## 2021-11-10 DIAGNOSIS — D509 Iron deficiency anemia, unspecified: Secondary | ICD-10-CM

## 2021-11-10 LAB — FERRITIN: Ferritin: 18.5 ng/mL — ABNORMAL LOW (ref 22.0–322.0)

## 2021-11-10 LAB — CBC WITH DIFFERENTIAL/PLATELET
Basophils Absolute: 0 10*3/uL (ref 0.0–0.1)
Basophils Relative: 0.6 % (ref 0.0–3.0)
Eosinophils Absolute: 0.3 10*3/uL (ref 0.0–0.7)
Eosinophils Relative: 3.8 % (ref 0.0–5.0)
HCT: 38.8 % — ABNORMAL LOW (ref 39.0–52.0)
Hemoglobin: 12.9 g/dL — ABNORMAL LOW (ref 13.0–17.0)
Lymphocytes Relative: 13.8 % (ref 12.0–46.0)
Lymphs Abs: 1 10*3/uL (ref 0.7–4.0)
MCHC: 33.3 g/dL (ref 30.0–36.0)
MCV: 91.3 fl (ref 78.0–100.0)
Monocytes Absolute: 0.5 10*3/uL (ref 0.1–1.0)
Monocytes Relative: 6.7 % (ref 3.0–12.0)
Neutro Abs: 5.4 10*3/uL (ref 1.4–7.7)
Neutrophils Relative %: 75.1 % (ref 43.0–77.0)
Platelets: 217 10*3/uL (ref 150.0–400.0)
RBC: 4.24 Mil/uL (ref 4.22–5.81)
RDW: 20.6 % — ABNORMAL HIGH (ref 11.5–15.5)
WBC: 7.2 10*3/uL (ref 4.0–10.5)

## 2021-11-10 NOTE — Progress Notes (Signed)
[name] [name] 71 y.o. [birth year] [id number]  Assessment & Plan:   Encounter Diagnoses  Name Primary?   PUD (peptic ulcer disease) Yes   Tubular adenoma of ascending colon    Abnormal colonoscopy    Iron deficiency anemia, unspecified iron deficiency anemia type    Repeat CBC and ferritin are done.  Lab Results  Component Value Date   WBC 7.2 11/10/2021   HGB 12.9 (L) 11/10/2021   HCT 38.8 (L) 11/10/2021   MCV 91.3 11/10/2021   PLT 217.0 11/10/2021   Lab Results  Component Value Date   FERRITIN 18.5 (L) 11/10/2021   He is not clear which PPI he is taking.  He does not know the exact name of the iron supplement.  We will contact him with the results and clarify as a Dex a lot or is it pantoprazole.  When I look at the pictures of his most recent EGD it looks more like a scarred and nearly healed ulcer and not an active ulcer in the duodenum.  I am not inclined to repeat the procedure.  We will work out follow-up of his iron deficiency anemia as well.  His hemoglobin is climbing but not normal that is probably why the ferritin is not up yet.  I am inclined for him to take a PPI at least for a 1 year.  I do not think a small bowel capsule endoscopy is necessary.  CC: [name] Barrack, MD   Subjective:   Chief Complaint: Follow-up of duodenal ulcer  HPI 71 year old white man with a history of a duodenal ulcer/GI bleed diagnosed at EGD by me 06/15/2021.  He takes Plavix.  A colonoscopy was performed in January 2023 because of a history of prior polyps years ago and lack of follow-up, and he had a huge polyp in the ascending colon.  He was referred to [name]. Stefani Dama [name] who repeated a colonoscopy and did an EMR of the polyp which was a tubular adenoma with focal high-grade dysplasia and there are plans for a 41-monthfollow-up (September).  The EGD had been done per [name]. MStefani DamaRoddy to document healing of his duodenal ulcer and unresolved iron deficiency anemia.  There was a  suspected angiodysplasia in the GE junction area that he cauterized and clipped.  He reported a linear 15 mm duodenal ulcer in the duodenum and duodenopathy.  Nummular lesions in the esophagus were biopsied and there was no significant abnormality on pathology.  Patient reports that he is taking pantoprazole but Dexilant is on his med list and he is not really sure.  He feels well.  He continues on Plavix he does not take aspirin because he is allergic. Allergies  Allergen Reactions   Aspirin Hives, Swelling and Other (See Comments)    Angioedema   Oxycodone Nausea Only   Contrast Media [Iodinated Contrast Media] Hives, Itching, Rash and Other (See Comments)    Developed hives despite pre-treatment   Norvasc [Amlodipine Besylate] Other (See Comments) and Cough    09/12/2013 gingival hyperplasia , [name] [name],DDS   Current Meds  Medication Sig   Accu-Chek Softclix Lancets lancets 1 each by Other route 3 (three) times daily.   atorvastatin (LIPITOR) 80 MG tablet TAKE 1 TABLET DAILY AT     6:00PM   Blood Glucose Monitoring Suppl (ACCU-CHEK AVIVA PLUS) w/Device KIT Check glucose TID or as needed E11.9   carvedilol (COREG) 3.125 MG tablet TAKE 1 TABLET TWICE DAILY  WITH MEALS   clopidogrel (  PLAVIX) 75 MG tablet Take 1 tablet (75 mg total) by mouth daily.   Coenzyme Q10 200 MG capsule Take 200 mg by mouth daily.   dexlansoprazole (DEXILANT) 60 MG capsule Take 1 capsule (60 mg total) by mouth daily.   famotidine-calcium carbonate-magnesium hydroxide (PEPCID COMPLETE) 10-800-165 MG chewable tablet Chew 0.5 tablets by mouth every 6 (six) hours as needed (heartburn).   glucose blood test strip Use as instructed   Homeopathic Products (ZICAM ALLERGY RELIEF NA) Place 1 spray into the nose daily as needed (colds).   irbesartan (AVAPRO) 300 MG tablet TAKE 1 TABLET DAILY   metFORMIN (GLUCOPHAGE-XR) 500 MG 24 hr tablet TAKE 2 TABLETS DAILY AFTER SUPPER   Multiple Vitamins-Minerals (MULTIVITAMIN WITH MINERALS)  tablet Take 1 tablet by mouth daily.   nitroGLYCERIN (NITROSTAT) 0.4 MG SL tablet Place 1 tablet (0.4 mg total) under the tongue every 5 (five) minutes as needed for chest pain.   sildenafil (REVATIO) 20 MG tablet Take 1-5 tablets by mouth daily as needed.   sodium chloride (OCEAN) 0.65 % SOLN nasal spray Place 1 spray into both nostrils as needed for congestion.   spironolactone (ALDACTONE) 25 MG tablet TAKE 1 TABLET DAILY   Undecylenic Ac-Zn Undecylenate (FUNGI-NAIL TOE & FOOT EX) Apply 1 application topically See admin instructions. Apply to bilateral big toes every other day   vitamin B-12 (CYANOCOBALAMIN) 1000 MCG tablet Take 1,000 mcg by mouth daily.   Past Medical History:  Diagnosis Date   Adenomatous colon polyp 09/07/2004   Amblyopia    CAD (coronary artery disease)    cardiac CT with a 50% LAD lesion in 2007   Diverticulosis    DM2 (diabetes mellitus, type 2) ([facility])    Elevated LFTs    secondary to labetalol   Esophageal stricture 2003   External hemorrhoids 2012   lanced by [name] [name] [name]   Family history of anesthesia complication    "sister had PONV"   GERD (gastroesophageal reflux disease)    esophageal stricture, pmh   Hiatal hernia 01/04/2000   History of kidney stones 2022   HLD (hyperlipidemia)    HTN (hypertension)    Hypopotassemia    Past Surgical History:  Procedure Laterality Date   BIOPSY  06/05/2021   Procedure: BIOPSY;  Surgeon: [name] Mayer, MD;  [facility];  Service: Endoscopy;;   BIOPSY  09/02/2021   Procedure: BIOPSY;  Surgeon: [name] [name]., MD;  Location: [place];  Service: Gastroenterology;;   COLONOSCOPY  06/27/2004   tics , ADENOMATOUS polyps. F/U due 2011   COLONOSCOPY  06/27/2010   Tics   COLONOSCOPY WITH PROPOFOL N/A 09/02/2021   Procedure: COLONOSCOPY WITH PROPOFOL;  Surgeon: [name] [name]., MD;  Location: [place];  Service: Gastroenterology;  Laterality: N/A;   CORONARY ANGIOPLASTY WITH STENT  PLACEMENT  04/01/2014   "2"   ENDOSCOPIC MUCOSAL RESECTION  09/02/2021   Procedure: ENDOSCOPIC MUCOSAL RESECTION;  Surgeon: [name] [name]., MD;  Location: [place];  Service: Gastroenterology;;   ESOPHAGEAL DILATION  06/27/2001   ESOPHAGOGASTRODUODENOSCOPY  06/27/2001   ERD, stricture dilated   ESOPHAGOGASTRODUODENOSCOPY (EGD) WITH PROPOFOL N/A 06/05/2021   Procedure: ESOPHAGOGASTRODUODENOSCOPY (EGD) WITH PROPOFOL;  Surgeon: [name] Mayer, MD;  [facility];  Service: Endoscopy;  Laterality: N/A;   ESOPHAGOGASTRODUODENOSCOPY (EGD) WITH PROPOFOL N/A 09/02/2021   Procedure: ESOPHAGOGASTRODUODENOSCOPY (EGD) WITH PROPOFOL;  Surgeon: [name] [name]., MD;  Location: [place];  Service: Gastroenterology;  Laterality: N/A;   EXCISIONAL HEMORRHOIDECTOMY  ~ 2008  HEMOSTASIS CLIP PLACEMENT  09/02/2021   Procedure: HEMOSTASIS CLIP PLACEMENT;  Surgeon: [name] [name]., MD;  Location: [place];  Service: Gastroenterology;;   HOT HEMOSTASIS N/A 09/02/2021   Procedure: HOT HEMOSTASIS (ARGON PLASMA COAGULATION/BICAP);  Surgeon: [name] [name]., MD;  Location: [place];  Service: Gastroenterology;  Laterality: N/A;   LEFT HEART CATHETERIZATION WITH CORONARY ANGIOGRAM N/A 03/26/2014   Procedure: LEFT HEART CATHETERIZATION WITH CORONARY ANGIOGRAM;  Surgeon: Peter [name] Martinique, MD;  Location: [facility] CATH LAB;  Service: Cardiovascular;  Laterality: N/A;   PERCUTANEOUS CORONARY STENT INTERVENTION (PCI-S) N/A 04/01/2014   Procedure: PERCUTANEOUS CORONARY STENT INTERVENTION (PCI-S);  Surgeon: Peter [name] Martinique, MD;  Location: [facility] CATH LAB;  Service: Cardiovascular;  Laterality: N/A;   POSTERIOR LAMINECTOMY / DECOMPRESSION LUMBAR SPINE  12/25/2009   L4 , [name] Sherwood Gambler   SHOULDER ARTHROSCOPY WITH ROTATOR CUFF REPAIR AND OPEN BICEPS TENODESIS Left 02/04/2019   Procedure: LEFT SHOULDER ARTHROSCOPY, BICEPS TENODESIS, MINI OPEN ROTATOR CUFF TEAR REPAIR;  Surgeon: Meredith Pel,  MD;  Location: [place];  Service: Orthopedics;  Laterality: Left;   SUBMUCOSAL INJECTION  09/02/2021   Procedure: SUBMUCOSAL INJECTION;  Surgeon: [name] [name]., MD;  Location: [facility];  Service: Gastroenterology;;   TOTAL KNEE ARTHROPLASTY Right 05/11/2021   Procedure: TOTAL KNEE ARTHROPLASTY;  Surgeon: Paralee Cancel, MD;  [facility];  Service: Orthopedics;  Laterality: Right;   Social History   Social History Narrative   Fun: Designer, jewellery.    family history includes Breast cancer in his maternal aunt; Diabetes in his brother, father, and paternal grandmother; Heart attack in his brother; Heart attack (age of onset: 51) in his maternal grandfather; Heart attack (age of onset: 2) in his mother; Heart disease in his sister; Heart disease (age of onset: 76) in his father; Prostate cancer in his father; Stroke (age of onset: 80) in his mother; Stroke (age of onset: 29) in his paternal grandfather.   Review of Systems As per HPI  Objective:   Physical Exam BP (!) 170/78   Pulse (!) 56   Ht '5\' 8"'  (1.727 [name])   Wt 179 lb 9.6 oz (81.5 kg)   BMI 27.31 kg/[name]

## 2021-11-10 NOTE — Patient Instructions (Addendum)
Please check what acid blocking pill you are on - Dexilant (dexlansoprazole) or Protonix (pantoprazole). ?Also find out what iron pill you are on. You can message [place] back or we will ask when we call you about the blood counts. ? ? ?Your provider has requested that you go to the basement level for lab work before leaving today. Press "B" on the elevator. The lab is located at the first door on the left as you exit the elevator. ? ?Due to recent changes in healthcare laws, you may see the results of your imaging and laboratory studies on MyChart before your provider has had a chance to review them.  We understand that in some cases there may be results that are confusing or concerning to you. Not all laboratory results come back in the same time frame and the provider may be waiting for multiple results in order to interpret others.  Please give [place] 48 hours in order for your provider to thoroughly review all the results before contacting the office for clarification of your results.  ? ?I appreciate the opportunity to care for you. ?[name], MD, [facility] ?

## 2021-11-12 ENCOUNTER — Other Ambulatory Visit: Payer: Self-pay

## 2021-11-12 DIAGNOSIS — D509 Iron deficiency anemia, unspecified: Secondary | ICD-10-CM

## 2021-11-12 MED ORDER — FERROUS SULFATE 324 (65 FE) MG PO TBEC: DELAYED_RELEASE_TABLET | ORAL | Status: DC

## 2021-11-29 NOTE — Progress Notes (Signed)
Chronic Care Management Pharmacy Note  12/06/2021 Name:  [name] [name] MRN:  [id number] DOB:  28-Oct-1950  Recommendations/Changes made from today's visit: He agrees to start monitoring and recording BP at home.  Last few office BP have been elevated.   Will have CMA check on home readings  in 30 days.  Patient to call sooner if consistently > 140/90  Subjective: [name] [name] is an 71 y.o. year old male who is a primary patient of [name], Algis Greenhouse, MD.  The CCM team was consulted for assistance with disease management and care coordination needs.    Engaged with patient by telephone for follow up visit in response to provider referral for pharmacy case management and/or care coordination services.   Consent to Services:  The patient was given information about Chronic Care Management services, agreed to services, and gave verbal consent prior to initiation of services.  Please see initial visit note for detailed documentation.   Patient Care Team: Vivi Barrack, MD as PCP - General (Family Medicine) Martinique, Peter M, MD as PCP - Cardiology (Cardiology) Calton Dach, MD as Consulting Physician (Optometry) Marlou Sa, Tonna Corner, MD as Consulting Physician (Orthopedic Surgery) [facility] as Consulting Physician Edythe Clarity, [facility] (Pharmacist)  Hospital visits: None in previous 6 months  Objective:  Lab Results  Component Value Date   CREATININE 0.91 08/27/2021   CREATININE 1.04 06/04/2021   CREATININE 1.10 05/25/2021    Lab Results  Component Value Date   HGBA1C 6.8 (A) 09/08/2021   Last diabetic Eye exam:  Lab Results  Component Value Date/Time   HMDIABEYEEXA No Retinopathy 04/28/2021 12:00 AM    Last diabetic Foot exam: No results found for: "HMDIABFOOTEX"      Component Value Date/Time   CHOL 110 09/02/2020 1010   CHOL 132 08/18/2016 0814   TRIG 100.0 09/02/2020 1010   TRIG 82 04/11/2006 0804   HDL 35.80 (L) 09/02/2020 1010    HDL 41 08/18/2016 0814   CHOLHDL 3 09/02/2020 1010   VLDL 20.0 09/02/2020 1010   LDLCALC 54 09/02/2020 1010   LDLCALC 68 03/05/2020 1009       Latest Ref Rng & Units 06/04/2021   12:31 PM 04/30/2021   11:02 AM 09/02/2020   10:10 AM  Hepatic Function  Total Protein 6.5 - 8.1 g/dL 6.3  6.8  6.4   Albumin 3.5 - 5.0 g/dL 3.5  4.1  4.0   AST 15 - 41 U/L _0 ALT 0 - 44 U/L _1 Alk Phosphatase 38 - 126 U/L 84  86  82   Total Bilirubin 0.3 - 1.2 mg/dL 0.7  0.9  0.6     Lab Results  Component Value Date/Time   TSH 0.96 09/02/2020 10:10 AM   TSH 0.63 03/05/2020 10:09 AM       Latest Ref Rng & Units 11/10/2021   10:15 AM 08/27/2021    4:11 PM 06/29/2021    9:02 AM  CBC  WBC 4.0 - 10.5 K/uL 7.2  8.2  7.2   Hemoglobin 13.0 - 17.0 g/dL 12.9  12.0  11.2   Hematocrit 39.0 - 52.0 % 38.8  36.3  34.7   Platelets 150.0 - 400.0 K/uL 217.0  316.0  331.0     No results found for: "VD25OH"  Clinical ASCVD:  The ASCVD Risk score (Arnett DK, et al., 2019) failed to calculate for the following  reasons:   The valid total cholesterol range is 130 to 320 mg/dL    Other: (CHADS2VASc if Afib, PHQ9 if depression, MMRC or CAT for COPD, ACT, DEXA)  Social History   Tobacco Use  Smoking Status Never  Smokeless Tobacco Never   BP Readings from Last 3 Encounters:  11/10/21 (!) 170/78  10/18/21 (!) 150/60  09/08/21 139/83   Pulse Readings from Last 3 Encounters:  11/10/21 (!) 56  10/18/21 (!) 56  09/08/21 65   Wt Readings from Last 3 Encounters:  11/10/21 179 lb 9.6 oz (81.5 kg)  10/18/21 179 lb 8 oz (81.4 kg)  09/08/21 175 lb (79.4 kg)    Assessment: Review of patient past medical history, allergies, medications, health status, including review of consultants reports, laboratory and other test data, was performed as part of comprehensive evaluation and provision of chronic care management services.   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 Sig Documenting Provider Last Dose Status Informant  Accu-Chek Softclix Lancets lancets [id number] Yes 1 each by Other route 3 (three) times daily. Vivi Barrack, MD Taking Active Self  atorvastatin (LIPITOR) 80 MG tablet [id number] Yes TAKE 1 TABLET DAILY AT     6:00PM Vivi Barrack, MD Taking Active   Blood Glucose Monitoring Suppl (ACCU-CHEK AVIVA PLUS) w/Device KIT [id number] Yes Check glucose TID or as needed E11.9 Vivi Barrack, MD Taking Active Self  carvedilol (COREG) 3.125 MG tablet [id number] Yes TAKE 1 TABLET TWICE DAILY  WITH MEALS Vivi Barrack, MD Taking Active   clopidogrel (PLAVIX) 75 MG tablet [id number] Yes TAKE 1 TABLET DAILY Vivi Barrack, MD Taking Active   Coenzyme Q10 200 MG capsule [id number] Yes Take 200 mg by mouth daily. [provider] Taking Active Self  famotidine-calcium carbonate-magnesium hydroxide (PEPCID COMPLETE) 10-800-165 MG chewable tablet [id number] Yes Chew 0.5 tablets by mouth every 6 (six) hours as needed (heartburn). [provider] Taking Active Self  ferrous sulfate 324 (65 Fe) MG TBEC [id number] Yes Take 1 tablet a day Gatha Mayer, MD Taking Active   glucose blood test strip [id number] Yes Use as instructed Vivi Barrack, MD Taking Active Self  Homeopathic Products [facility] ALLERGY RELIEF NA) [id number] Yes Place 1 spray into the nose daily as needed (colds). [provider]  Taking Active Self  irbesartan (AVAPRO) 300 MG tablet [id number] Yes TAKE 1 TABLET DAILY Vivi Barrack, MD Taking Active   metFORMIN (GLUCOPHAGE-XR) 500 MG 24 hr tablet [id number] Yes TAKE 2 TABLETS DAILY AFTER SUPPER Vivi Barrack, MD Taking Active Self  Multiple Vitamins-Minerals (MULTIVITAMIN WITH MINERALS) tablet [id number] Yes Take 1 tablet by mouth daily. [provider] Taking Active Self  nitroGLYCERIN (NITROSTAT) 0.4 MG SL tablet [id number] Yes Place 1 tablet (0.4 mg total) under the tongue every 5 (five) minutes as needed for chest pain. Martinique, Peter M, MD Taking Active Self  Med Note Wilmon Pali, MELISSA R   Thu Aug 26, 2021  2:44 PM)    sildenafil (REVATIO) 20 MG tablet [id number] Yes Take 1-5 tablets by mouth daily as needed. Vivi Barrack, MD Taking Active Self  sodium chloride (OCEAN) 0.65 % SOLN nasal spray [id number] Yes Place 1 spray into both nostrils as needed for congestion. [provider] Taking Active Self  spironolactone (ALDACTONE) 25 MG tablet [id number] Yes TAKE 1 TABLET DAILY Vivi Barrack, MD Taking Active   Undecylenic Ac-Zn Undecylenate (FUNGI-NAIL TOE & FOOT EX) [id number] Yes Apply 1 application topically See admin instructions. Apply to bilateral big toes every other day [provider] Taking Active Self  vitamin B-12 (CYANOCOBALAMIN) 1000 MCG tablet [id number] Yes Take 1,000 mcg by mouth daily. [provider] Taking Active Self            Patient Active Problem List   Diagnosis Date Noted   IDA (iron deficiency anemia) 08/31/2021   Abnormal colonoscopy 08/29/2021   Tubular adenoma of ascending colon 08/29/2021   Hx of adenomatous colonic polyps 08/29/2021   PUD (peptic ulcer disease) 08/10/2021   S/P total knee arthroplasty, 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   Nephrolithiasis 03/05/2020   Labral tear of long head of biceps tendon, left, sequela    Complete tear of left rotator cuff    Antiplatelet or antithrombotic long-term  use 01/17/2017   Diverticulosis of colon (without mention of hemorrhage) 04/25/2011   Adenomatous colon polyp 03/17/2011   LOW BACK PAIN, CHRONIC 12/13/2007   Hyperlipidemia associated with type 2 diabetes mellitus ([place]) 06/08/2007   T2DM (type 2 diabetes mellitus) ([place]) 02/27/2007   Hypertension associated with diabetes ([place]) 02/27/2007   AMBLYOPIA 12/06/2006   GERD 12/06/2006    Immunization History  Administered Date(s) Administered   Fluad Quad(high Dose 65+) 02/21/2019, 04/07/2020, 03/08/2021   Influenza,inj,Quad PF,6+ Mos 03/19/2014   PFIZER(Purple Top)SARS-COV-2 Vaccination 08/01/2019, 08/27/2019, 03/25/2020, 03/12/2021   Pneumococcal Conjugate-13 01/17/2017   Pneumococcal Polysaccharide-23 01/31/2018   Td 12/18/2008   Tdap 04/07/2020   Zoster, Live 12/24/2012    Conditions to be addressed/monitored: HLD HTN DMII OA   Care Plan : [place]  Updates made by Edythe Clarity, RPH since 12/06/2021 12:00 AM     Problem: HLD HTN DMII OA   Priority: High     Long-Range Goal: Disease Management   Start Date: 12/01/2020  Expected End Date: 12/01/2021  Recent Progress: On track  Priority: High  Note:   Current Barriers:  Elevated BP  Interventions: 1:1 collaboration with Vivi Barrack, MD regarding development and update of comprehensive plan of care as evidenced by provider attestation and co-signature Inter-disciplinary care team collaboration (see longitudinal plan of care) Comprehensive medication review performed; medication list updated in electronic medical record No Rx changes  Hypertension (BP goal <140/90) -Controlled -Current treatment: Irbesartan 300 mg once daily Appropriate, Query effective, ,  Carvedilol 3.125 mg twice daily Appropriate, Query effective, Spironolactone 25 mg once daily Appropriate, Query effective, -Current home readings: at goal -Current dietary habits: some processed foods and carbs - rice, bagels -Current exercise  habits: limited due to knee pain - is strongly considering knee replacement -Denies hypotensive/hypertensive symptoms -Educated on Symptoms of hypotension and importance of maintaining adequate hydration; -Counseled to monitor BP at home 1-2x/month, document, and provide log at future appointments -Counseled on diet and exercise extensively Recommended to continue current medication  Update 03/30/21 155/80 this morning, patient was dealing with stressor of switching cellular phones.  Normally  controlled at home per pt and has been controlled previously in office.  No changes to medications.  Reports 100% adherence.  Recommend he check BP at least once or twice weekly and reports consistently elevated BP's to myself if < 140/90.  Update 12/06/21 BP was elevated last two in office readings. He has not been checking at home. We discussed the need to monitor at home and record. He agrees to do so at least once or twice weekly.   Call me if consistently > 140/90. Will have CMA FU in 30 days to assess home readings.  Hyperlipidemia: (LDL goal < 70) -Controlled -Current treatment: Atorvastatin 80 mg once daily -Educated on Cholesterol goals;  -Recommended to continue current medication  Diabetes (A1c goal <7%) -Not ideally controlled -Current medications: Metformin 500 mg XR - two tablets once daily -Medications previously tried: IR (did not tolerate) -Current home glucose readings fasting glucose: not testing post prandial glucose: not testing -Educated on A1c and blood sugar goals; -Reviewed side effects - tolerating XR preparation much better, now is able to take consistently due to not needing to bring meds to work with him -Counseled to check feet daily and get yearly eye exams -Counseled on diet and exercise extensively Recommended to continue current medication  Update 03/30/21 Continues to tolerate XR metformin.  Takes all at one time which has improved adherence.  A1c has  decreased to 7.0.  He is now more conscious of what he is eating.  Trying to substitute protein shakes with fruit as snacks to fill him up and limit cravings.  Continue current meds.  Update 12/06/21 Continues to take metformin and tolerating. A1c continues to improve. Most recent A1c was 6.8 No changes needed at this time continue routine screenings.             Pharmacist Clinical Goal(s):  Patient will contact provider office for questions/concerns as evidenced notation of same in electronic health record through collaboration with PharmD and provider.   Patient Goals/Self-Care Activities Patient will:  - take medications as prescribed target a minimum of 150 minutes of moderate intensity exercise weekly  Follow Up Plan:  [place] f/u call 6 months Medication Assistance: None required.  Patient affirms current coverage meets needs.  Patient's preferred pharmacy is:  [facility] [id number], [place] [address] Phone: [phone] Fax: [phone]  [facility] [place] [place] [facility] [address] [place] [address] Phone: [phone] Fax: [phone]   Follow Up:  Patient agrees to Care Plan and Follow-up.  Future Appointments  Date Time Provider [place]  03/15/2022  9:20 AM Vivi Barrack, MD [facility] [place], PharmD Clinical Pharmacist  [facility] [phone]

## 2021-12-05 ENCOUNTER — Other Ambulatory Visit: Payer: Self-pay | Admitting: Family Medicine

## 2021-12-06 ENCOUNTER — Ambulatory Visit: Payer: Medicare Other | Admitting: Pharmacist

## 2021-12-06 DIAGNOSIS — E1165 Type 2 diabetes mellitus with hyperglycemia: Secondary | ICD-10-CM

## 2021-12-06 DIAGNOSIS — I152 Hypertension secondary to endocrine disorders: Secondary | ICD-10-CM

## 2021-12-06 NOTE — Patient Instructions (Signed)
Visit Information   Goals Addressed   None    Patient Care Plan: CCM Pharmacy Care Plan     Problem Identified: HLD HTN DMII OA   Priority: High     Long-Range Goal: Disease Management   Start Date: 12/01/2020  Expected End Date: 12/01/2021  Recent Progress: On track  Priority: High  Note:   Interventions: 1:1 collaboration with Vivi Barrack, MD regarding development and update of comprehensive plan of care as evidenced by provider attestation and co-signature Inter-disciplinary care team collaboration (see longitudinal plan of care) Comprehensive medication review performed; medication list updated in electronic medical record No Rx changes  Hypertension (BP goal <140/90) -Controlled -Current treatment: Irbesartan 300 mg once daily Carvedilol 3.125 mg twice daily Spironolactone 25 mg once daily -Current home readings: at goal -Current dietary habits: some processed foods and carbs - rice, bagels -Current exercise habits: limited due to knee pain - is strongly considering knee replacement -Denies hypotensive/hypertensive symptoms -Educated on Symptoms of hypotension and importance of maintaining adequate hydration; -Counseled to monitor BP at home 1-2x/month, document, and provide log at future appointments -Counseled on diet and exercise extensively Recommended to continue current medication  Update 03/30/21 155/80 this morning, patient was dealing with stressor of switching cellular phones.  Normally controlled at home per pt and has been controlled previously in office.  No changes to medications.  Reports 100% adherence.  Recommend he check BP at least once or twice weekly and reports consistently elevated BP's to myself if < 140/90.  Hyperlipidemia: (LDL goal < 70) -Controlled -Current treatment: Atorvastatin 80 mg once daily -Educated on Cholesterol goals;  -Recommended to continue current medication  Diabetes (A1c goal <7%) -Not ideally controlled -Current  medications: Metformin 500 mg XR - two tablets once daily -Medications previously tried: IR (did not tolerate) -Current home glucose readings fasting glucose: not testing post prandial glucose: not testing -Educated on A1c and blood sugar goals; -Reviewed side effects - tolerating XR preparation much better, now is able to take consistently due to not needing to bring meds to work with him -Counseled to check feet daily and get yearly eye exams -Counseled on diet and exercise extensively Recommended to continue current medication  Update 03/30/21 Continues to tolerate XR metformin.  Takes all at one time which has improved adherence.  A1c has decreased to 7.0.  He is now more conscious of what he is eating.  Trying to substitute protein shakes with fruit as snacks to fill him up and limit cravings.  Continue current meds.           The patient verbalized understanding of instructions, educational materials, and care plan provided today and DECLINED offer to receive copy of patient instructions, educational materials, and care plan.  Telephone follow up appointment with pharmacy team member scheduled for: 6 months  Edythe Clarity, [place], PharmD Clinical Pharmacist  [facility] [phone]

## 2021-12-14 ENCOUNTER — Other Ambulatory Visit (INDEPENDENT_AMBULATORY_CARE_PROVIDER_SITE_OTHER): Payer: Medicare Other

## 2021-12-14 DIAGNOSIS — D509 Iron deficiency anemia, unspecified: Secondary | ICD-10-CM

## 2021-12-14 LAB — FERRITIN: Ferritin: 19.4 ng/mL — ABNORMAL LOW (ref 22.0–322.0)

## 2021-12-14 LAB — CBC WITH DIFFERENTIAL/PLATELET
Basophils Absolute: 0.1 10*3/uL (ref 0.0–0.1)
Basophils Relative: 0.9 % (ref 0.0–3.0)
Eosinophils Absolute: 0.3 10*3/uL (ref 0.0–0.7)
Eosinophils Relative: 4.2 % (ref 0.0–5.0)
HCT: 43.2 % (ref 39.0–52.0)
Hemoglobin: 14.4 g/dL (ref 13.0–17.0)
Lymphocytes Relative: 14.8 % (ref 12.0–46.0)
Lymphs Abs: 1 10*3/uL (ref 0.7–4.0)
MCHC: 33.3 g/dL (ref 30.0–36.0)
MCV: 93.8 fl (ref 78.0–100.0)
Monocytes Absolute: 0.4 10*3/uL (ref 0.1–1.0)
Monocytes Relative: 6.2 % (ref 3.0–12.0)
Neutro Abs: 5 10*3/uL (ref 1.4–7.7)
Neutrophils Relative %: 73.9 % (ref 43.0–77.0)
Platelets: 203 10*3/uL (ref 150.0–400.0)
RBC: 4.61 Mil/uL (ref 4.22–5.81)
RDW: 16.6 % — ABNORMAL HIGH (ref 11.5–15.5)
WBC: 6.7 10*3/uL (ref 4.0–10.5)

## 2022-01-13 ENCOUNTER — Other Ambulatory Visit: Payer: Self-pay | Admitting: Gastroenterology

## 2022-02-09 ENCOUNTER — Other Ambulatory Visit: Payer: Self-pay

## 2022-02-09 ENCOUNTER — Telehealth: Payer: Self-pay

## 2022-02-09 DIAGNOSIS — Z86010 Personal history of colonic polyps: Secondary | ICD-10-CM

## 2022-02-09 MED ORDER — PEG 3350-KCL-NA BICARB-NACL 420 G PO SOLR: 4000.0000 mL | Freq: Once | ORAL | 0 refills | Status: AC

## 2022-02-09 NOTE — Telephone Encounter (Signed)
Colon EMR scheduled, pt instructed and medications reviewed.  Patient instructions mailed to home.  Patient to call with any questions or concerns.  

## 2022-02-09 NOTE — Telephone Encounter (Signed)
-----   Message from [name]., MD sent at 02/09/2022  5:40 AM EDT ----- Regarding: RE: Repeat colonopscopy provider CG, But may take the first relook.  Not sure why the recall went to you instead of me.    Nyellie Yetter, Please work on scheduling this patient for follow-up colonoscopy with EMR 75 minutes case with me.   Please update Dr. Carlean Purl when he has been scheduled. Thanks. GM ----- Message ----- From: Gatha Mayer, MD Sent: 02/08/2022   7:17 PM EDT To: [name]., MD Subject: Repeat colonopscopy provider                   [name] - you had indicated a 6 month f/u colon after EMR  Came back to me on recalls   I am fine to do it - just wanted to doublecheck with you about it in case you should do it  I guess I should do it at hospital in case  Thanks  [name]

## 2022-02-09 NOTE — Telephone Encounter (Signed)
Colon EMR has been scheduled for 04/14/22 at [facility] with GM at 1130 am   OK to hold Plavix for 5 days prior to colonoscopy  Left message on machine to call back

## 2022-02-11 ENCOUNTER — Telehealth: Payer: Self-pay

## 2022-02-11 NOTE — Telephone Encounter (Signed)
Left message for the pt that EGD was added to his upcoming case.  He was asked to call back if he has any questions or concerns. I will also send a My Chart message

## 2022-02-11 NOTE — Telephone Encounter (Signed)
Inbound call from patient returning call. Please give a call back to further advise.  Thank you 

## 2022-02-11 NOTE — Telephone Encounter (Signed)
EGD added Left message on machine to call back

## 2022-02-11 NOTE — Telephone Encounter (Signed)
-----   Message from Gatha Mayer, MD sent at 02/10/2022  5:03 PM EDT ----- Regarding: RE: Repeat colonopscopy provider He saw me a few months ago and we decided not to do an EGD.  He had a duodenal ulcer that was not totally healed when you did his last EGD.   I think it is optional-since he is going to be sedated it is reasonable. [name] please explain to the patient that I recommend he go ahead with an EGD in conjunction with his colonoscopy.  Thank you ----- Message ----- From: [name], RN Sent: 02/09/2022   8:24 AM EDT To: Gatha Mayer, MD; [name]., MD Subject: RE: Repeat colonopscopy provider               There is a recall EGD for Dr Carlean Purl in the system and a recall colon EMR in for Dr Rush Landmark.  I will take care of it.  Does the pt need the EGD? ----- Message ----- From: [name]., MD Sent: 02/09/2022   5:41 AM EDT To: Gatha Mayer, MD; [name], RN Subject: RE: Repeat colonopscopy provider               CG, But may take the first relook.  Not sure why the recall went to you instead of me.    [name], Please work on scheduling this patient for follow-up colonoscopy with EMR 75 minutes case with me.   Please update Dr. Carlean Purl when he has been scheduled. Thanks. GM ----- Message ----- From: Gatha Mayer, MD Sent: 02/08/2022   7:17 PM EDT To: [name]., MD Subject: Repeat colonopscopy provider                   [name] - you had indicated a 6 month f/u colon after EMR  Came back to me on recalls   I am fine to do it - just wanted to doublecheck with you about it in case you should do it  I guess I should do it at hospital in case  Thanks  [name]

## 2022-02-26 ENCOUNTER — Other Ambulatory Visit: Payer: Self-pay | Admitting: Family Medicine

## 2022-03-01 ENCOUNTER — Telehealth: Payer: Self-pay | Admitting: Pharmacist

## 2022-03-01 NOTE — Progress Notes (Signed)
Chronic Care Management Pharmacy Assistant   Name: [name] [name]  MRN: [id number] DOB: [birth year]   Reason for Encounter: Hypertension Adherence Call    Recent office visits:  None  Recent consult visits:  None  Hospital visits:  None in previous 6 months  Medications: Outpatient Encounter Medications as of 03/01/2022  Medication Sig   Accu-Chek Softclix Lancets lancets 1 each by Other route 3 (three) times daily.   atorvastatin (LIPITOR) 80 MG tablet TAKE 1 TABLET DAILY AT     6:00PM   Blood Glucose Monitoring Suppl (ACCU-CHEK AVIVA PLUS) w/Device KIT Check glucose TID or as needed E11.9   carvedilol (COREG) 3.125 MG tablet TAKE 1 TABLET TWICE DAILY  WITH MEALS   clopidogrel (PLAVIX) 75 MG tablet TAKE 1 TABLET DAILY   Coenzyme Q10 200 MG capsule Take 200 mg by mouth daily.   famotidine-calcium carbonate-magnesium hydroxide (PEPCID COMPLETE) 10-800-165 MG chewable tablet Chew 0.5 tablets by mouth every 6 (six) hours as needed (heartburn).   ferrous sulfate 324 (65 Fe) MG TBEC Take 1 tablet a day   glucose blood test strip Use as instructed   Homeopathic Products (ZICAM ALLERGY RELIEF NA) Place 1 spray into the nose daily as needed (colds).   irbesartan (AVAPRO) 300 MG tablet TAKE 1 TABLET DAILY   metFORMIN (GLUCOPHAGE-XR) 500 MG 24 hr tablet TAKE 2 TABLETS DAILY AFTER SUPPER   Multiple Vitamins-Minerals (MULTIVITAMIN WITH MINERALS) tablet Take 1 tablet by mouth daily.   nitroGLYCERIN (NITROSTAT) 0.4 MG SL tablet Place 1 tablet (0.4 mg total) under the tongue every 5 (five) minutes as needed for chest pain.   pantoprazole (PROTONIX) 40 MG tablet TAKE 1 TABLET TWICE DAILY  BEFORE MEALS   sildenafil (REVATIO) 20 MG tablet Take 1-5 tablets by mouth daily as needed.   sodium chloride (OCEAN) 0.65 % SOLN nasal spray Place 1 spray into both nostrils as needed for congestion.   spironolactone (ALDACTONE) 25 MG tablet TAKE 1 TABLET DAILY   Undecylenic Ac-Zn Undecylenate (FUNGI-NAIL  TOE & FOOT EX) Apply 1 application topically See admin instructions. Apply to bilateral big toes every other day   vitamin B-12 (CYANOCOBALAMIN) 1000 MCG tablet Take 1,000 mcg by mouth daily.   No facility-administered encounter medications on file as of 03/01/2022.   Reviewed chart prior to disease state call. Spoke with patient regarding BP  Recent Office Vitals: BP Readings from Last 3 Encounters:  11/10/21 (!) 170/78  10/18/21 (!) 150/60  09/08/21 139/83   Pulse Readings from Last 3 Encounters:  11/10/21 (!) 56  10/18/21 (!) 56  09/08/21 65    Wt Readings from Last 3 Encounters:  11/10/21 179 lb 9.6 oz (81.5 kg)  10/18/21 179 lb 8 oz (81.4 kg)  09/08/21 175 lb (79.4 kg)     Kidney Function Lab Results  Component Value Date/Time   CREATININE 0.91 08/27/2021 04:11 PM   CREATININE 1.04 06/04/2021 12:31 PM   CREATININE 0.96 03/05/2020 10:09 AM   CREATININE 1.00 07/09/2015 09:57 AM   GFR 84.74 08/27/2021 04:11 PM   GFRNONAA >60 06/04/2021 12:31 PM   GFRAA >60 10/21/2019 01:11 AM       Latest Ref Rng & Units 08/27/2021    4:11 PM 06/04/2021   12:31 PM 05/25/2021   11:25 AM  BMP  Glucose 70 - 99 mg/dL 156  255  213   BUN 6 - 23 mg/dL 16  42  21   Creatinine 0.40 - 1.50 mg/dL 0.91  1.04  1.10   Sodium 135 - 145 mEq/L 142  134  134   Potassium 3.5 - 5.1 mEq/L 4.0  4.5  5.1   Chloride 96 - 112 mEq/L 105  102  100   CO2 19 - 32 mEq/L '28  22  25   ' Calcium 8.4 - 10.5 mg/dL 9.6  8.9  9.4     Current antihypertensive regimen:  Carvedilol 3.125 mg twice daily with meals Irbesartan 300 mg daily  How often are you checking your Blood Pressure? several times per month  Current home BP readings: 125/80  What recent interventions/DTPs have been made by any provider to improve Blood Pressure control since last CPP Visit: No recent interventions or DTPs.  Any recent hospitalizations or ED visits since last visit with CPP? No  What diet changes have been made to improve Blood  Pressure Control?  Patient states he's been eating a lot of sweets lately.  What exercise is being done to improve your Blood Pressure Control?  Patient states he has been going to the gym 3 times a week to help with his knee pain.  Adherence Review: Is the patient currently on ACE/ARB medication? Yes Does the patient have >5 day gap between last estimated fill dates? No   Care Gaps: Medicare Annual Wellness: Overdue Ophthalmology Exam: Next due on 03/11/2022 Hemoglobin A1C: 6.8% on 09/09/2019 Colonoscopy: Completed 09/02/2021  Future Appointments  Date Time Provider [place]  03/15/2022  9:20 AM Vivi Barrack, MD [facility]  06/06/2022  3:30 PM [facility] PHARMACIST [facility]   Star Rating Drugs: Atorvastatin 80 mg last filled 12/11/2021 90 DS Irbesartan 300 mg last filled 12/11/2021 90 DS  [name], [facility] Pharmacist Assistant [phone]

## 2022-03-15 ENCOUNTER — Encounter: Payer: Self-pay | Admitting: Family Medicine

## 2022-03-15 ENCOUNTER — Ambulatory Visit (INDEPENDENT_AMBULATORY_CARE_PROVIDER_SITE_OTHER): Payer: Medicare Other | Admitting: Family Medicine

## 2022-03-15 VITALS — BP 122/68 | HR 50 | Ht 69.0 in | Wt 184.8 lb

## 2022-03-15 DIAGNOSIS — E1159 Type 2 diabetes mellitus with other circulatory complications: Secondary | ICD-10-CM | POA: Diagnosis not present

## 2022-03-15 DIAGNOSIS — E1169 Type 2 diabetes mellitus with other specified complication: Secondary | ICD-10-CM | POA: Diagnosis not present

## 2022-03-15 DIAGNOSIS — E785 Hyperlipidemia, unspecified: Secondary | ICD-10-CM | POA: Diagnosis not present

## 2022-03-15 DIAGNOSIS — E1165 Type 2 diabetes mellitus with hyperglycemia: Secondary | ICD-10-CM

## 2022-03-15 DIAGNOSIS — D509 Iron deficiency anemia, unspecified: Secondary | ICD-10-CM

## 2022-03-15 DIAGNOSIS — Z23 Encounter for immunization: Secondary | ICD-10-CM

## 2022-03-15 DIAGNOSIS — I152 Hypertension secondary to endocrine disorders: Secondary | ICD-10-CM | POA: Diagnosis not present

## 2022-03-15 LAB — COMPREHENSIVE METABOLIC PANEL
ALT: 20 U/L (ref 0–53)
AST: 15 U/L (ref 0–37)
Albumin: 3.9 g/dL (ref 3.5–5.2)
Alkaline Phosphatase: 82 U/L (ref 39–117)
BUN: 19 mg/dL (ref 6–23)
CO2: 27 mEq/L (ref 19–32)
Calcium: 9.5 mg/dL (ref 8.4–10.5)
Chloride: 105 mEq/L (ref 96–112)
Creatinine, Ser: 0.94 mg/dL (ref 0.40–1.50)
GFR: 81.2 mL/min (ref >60.00)
Glucose, Bld: 152 mg/dL — ABNORMAL HIGH (ref 70–99)
Potassium: 4.5 mEq/L (ref 3.5–5.1)
Sodium: 139 mEq/L (ref 135–145)
Total Bilirubin: 0.6 mg/dL (ref 0.2–1.2)
Total Protein: 6.8 g/dL (ref 6.0–8.3)

## 2022-03-15 LAB — MICROALBUMIN / CREATININE URINE RATIO
Creatinine,U: 109.9 mg/dL
Microalb Creat Ratio: 0.8 mg/g (ref 0.0–30.0)
Microalb, Ur: 0.9 mg/dL (ref 0.0–1.9)

## 2022-03-15 LAB — IBC + FERRITIN
Ferritin: 36.5 ng/mL (ref 22.0–322.0)
Iron: 111 ug/dL (ref 42–165)
Saturation Ratios: 32.9 % (ref 20.0–50.0)
TIBC: 337.4 ug/dL (ref 250.0–450.0)
Transferrin: 241 mg/dL (ref 212.0–360.0)

## 2022-03-15 LAB — CBC
HCT: 41.9 % (ref 39.0–52.0)
Hemoglobin: 14.1 g/dL (ref 13.0–17.0)
MCHC: 33.7 g/dL (ref 30.0–36.0)
MCV: 98.2 fl (ref 78.0–100.0)
Platelets: 208 10*3/uL (ref 150.0–400.0)
RBC: 4.27 Mil/uL (ref 4.22–5.81)
RDW: 14.7 % (ref 11.5–15.5)
WBC: 7.3 10*3/uL (ref 4.0–10.5)

## 2022-03-15 LAB — POCT GLYCOSYLATED HEMOGLOBIN (HGB A1C): Hemoglobin A1C: 7.3 % — AB (ref 4.0–5.6)

## 2022-03-15 LAB — TSH: TSH: 1.04 u[IU]/mL (ref 0.35–5.50)

## 2022-03-15 NOTE — Assessment & Plan Note (Signed)
Blood pressure at goal on Coreg 3.125 mg twice daily, Irbesartan 300 mg daily, spironolactone 25 mg daily.

## 2022-03-15 NOTE — Progress Notes (Signed)
   [name] [name] is a 71 y.o. male who presents today for an office visit.  Assessment/Plan:  Chronic Problems Addressed Today: Hypertension associated with diabetes ([place]) Blood pressure at goal on Coreg 3.125 mg twice daily, Irbesartan 300 mg daily, spironolactone 25 mg daily.  Hyperlipidemia associated with type 2 diabetes mellitus ([name]) On lipitor '80mg'$  daily. Check lipids.   T2DM (type 2 diabetes mellitus) ([facility]) A1c up slightly 7.3.  We discussed lifestyle modifications.  He will work on cutting down sweets.  We can continue metformin 1000 mg daily.  We will recheck again in 6 months.  IDA (iron deficiency anemia) Continue management per GI.  We will be following up with them again soon.  We will check iron panel today.     Subjective:  HPI:  See A/p for status of chronic conditions.         Objective:  Physical Exam: BP 122/68   Pulse (!) 50   Ht '5\' 9"'$  (1.753 m)   Wt 184 lb 12.8 oz (83.8 kg)   SpO2 99%   BMI 27.29 kg/m   Wt Readings from Last 3 Encounters:  03/15/22 184 lb 12.8 oz (83.8 kg)  11/10/21 179 lb 9.6 oz (81.5 kg)  10/18/21 179 lb 8 oz (81.4 kg)    Gen: No acute distress, resting comfortably CV: Regular rate and rhythm with no murmurs appreciated Pulm: Normal work of breathing, clear to auscultation bilaterally with no crackles, wheezes, or rhonchi Neuro: Grossly normal, moves all extremities Psych: Normal affect and thought content      Lorenso Quirino M. [name], MD 03/15/2022 10:22 AM

## 2022-03-15 NOTE — Patient Instructions (Addendum)
It was very nice to see you today!  Your A1c is  up a little bit. Please continue to work on diet and exercise.  We will check blood work today.  Come back to see me in me in 6 months. Come back sooner if needed.   Take care, Dr [name]  PLEASE NOTE:  If you had any lab tests please let us know if you have not heard back within a few days. You may see your results on mychart before we have a chance to review them but we will give you a call once they are reviewed by [place]. If we ordered any referrals today, please let us know if you have not heard from their office within the next week.   Please try these tips to maintain a healthy lifestyle:  Eat at least 3 REAL meals and 1-2 snacks per day.  Aim for no more than 5 hours between eating.  If you eat breakfast, please do so within one hour of getting up.   Each meal should contain half fruits/vegetables, one quarter protein, and one quarter carbs (no bigger than a computer mouse)  Cut down on sweet beverages. This includes juice, soda, and sweet tea.   Drink at least 1 glass of water with each meal and aim for at least 8 glasses per day  Exercise at least 150 minutes every week.

## 2022-03-15 NOTE — Addendum Note (Signed)
Addended by: Betti Cruz on: 03/15/2022 10:30 AM   Modules accepted: Orders

## 2022-03-15 NOTE — Assessment & Plan Note (Signed)
Continue management per GI.  We will be following up with them again soon.  We will check iron panel today.

## 2022-03-15 NOTE — Assessment & Plan Note (Signed)
A1c up slightly 7.3.  We discussed lifestyle modifications.  He will work on cutting down sweets.  We can continue metformin 1000 mg daily.  We will recheck again in 6 months.

## 2022-03-15 NOTE — Assessment & Plan Note (Signed)
On lipitor '80mg'$  daily. Check lipids.

## 2022-03-18 NOTE — Progress Notes (Signed)
Please inform patient of the following:  Labs are all stable.  Do not need to make any changes to treatment plan at this time.  We can recheck again in 6 months.

## 2022-03-24 ENCOUNTER — Ambulatory Visit (INDEPENDENT_AMBULATORY_CARE_PROVIDER_SITE_OTHER): Payer: Medicare Other

## 2022-03-24 DIAGNOSIS — Z0000 Encounter for general adult medical examination without abnormal findings: Secondary | ICD-10-CM | POA: Diagnosis not present

## 2022-03-24 NOTE — Patient Instructions (Signed)
[name] [name] , Thank you for taking time to come for your Medicare Wellness Visit. I appreciate your ongoing commitment to your health goals. Please review the following plan we discussed and let me know if I can assist you in the future.   These are the goals we discussed:  Goals      DIET - INCREASE WATER INTAKE     Drinks 2 bottles of water a day but wants to increase to more     Patient Stated     Lose weight to 180lbs and reduced a1c      Patient Stated     Get weight down a bit      [place] (see longitudinal plan of care for additional care plan information)  Current Barriers:  Chronic Disease Management support, education, and care coordination needs related to Hypertension, Hyperlipidemia, and Diabetes   Hypertension BP Readings from Last 3 Encounters:  03/05/20 (!) 149/78  02/20/20 119/82  10/22/19 110/70  Pharmacist Clinical Goal(s): Over the next 180 days, patient will work with PharmD and providers to maintain BP goal <140/90 Current regimen:  Metformin 500 mg twice daily  Interventions: Review switch to metformin XR 1000 mg daily with evening meal  Patient self care activities - Over the next 180 days, patient will: Check BP once every 1-2 weeks, document, and provide at future appointments Ensure daily salt intake < 2300 mg/day  Hyperlipidemia Lab Results  Component Value Date/Time   LDLCALC 68 03/05/2020 10:09 AM  Pharmacist Clinical Goal(s): Over the next 180 days, patient will work with PharmD and providers to maintain LDL goal < 100 Current regimen:  Atorvastatin 80 mg once daily Interventions: Continue current management Patient self care activities - Over the next 180 days, patient will: Continue current management  Diabetes Lab Results  Component Value Date/Time   HGBA1C 7.5 (H) 03/05/2020 10:09 AM   HGBA1C 7.2 (A) 08/28/2019 10:22 AM   HGBA1C 7.2 (H) 02/21/2019 11:23 AM  Pharmacist Clinical Goal(s): Over the  next 180 days, patient will work with PharmD and providers to achieve A1c goal <7% Current regimen:  Metformin 500 mg twice daily with meals Interventions: Consider switch to metformin XL 1000 mg once daily Patient self care activities - Over the next 180 days, patient will: Check blood sugar as directed, document, and provide at future appointments Contact provider with any episodes of hypoglycemia  Medication management Pharmacist Clinical Goal(s): Over the next 180 days, patient will work with PharmD and providers to achieve optimal medication adherence Current pharmacy: Mail order Interventions Comprehensive medication review performed. Continue current medication management strategy. Patient self care activities - Over the next 180 days, patient will: Focus on medication adherence by reporting any questions or concerns to PharmD and/or provider(s)  Please see past updates related to this goal by clicking on the "Past Updates" button in the selected goal .     Track and Manage My Blood Pressure-Hypertension     Timeframe:  Long-Range Goal Priority:  High Start Date:   03/30/21                          Expected End Date: 09/28/21                       Follow Up Date 06/30/21    - check blood pressure weekly - choose a place to take my blood pressure (  home, clinic or office, retail store) - write blood pressure results in a log or diary    Why is this important?   You won't feel high blood pressure, but it can still hurt your blood vessels.  High blood pressure can cause heart or kidney problems. It can also cause a stroke.  Making lifestyle changes like losing a little weight or eating less salt will help.  Checking your blood pressure at home and at different times of the day can help to control blood pressure.  If the doctor prescribes medicine remember to take it the way the doctor ordered.  Call the office if you cannot afford the medicine or if there are questions about  it.     Notes:      Weight (lb) < 200 lb (90.7 kg)     Would like to lose 10lbs in the next year        This is a list of the screening recommended for you and due dates:  Health Maintenance  Topic Date Due   Complete foot exam   02/21/2020   COVID-19 Vaccine (5 - Pfizer risk series) 03/31/2022*   Zoster (Shingles) Vaccine (1 of 2) 06/14/2022*   Eye exam for diabetics  04/28/2022   Hemoglobin A1C  09/13/2022   Yearly kidney function blood test for diabetes  03/16/2023   Yearly kidney health urinalysis for diabetes  03/16/2023   Colon Cancer Screening  09/03/2026   Tetanus Vaccine  04/07/2030   Pneumonia Vaccine  Completed   Flu Shot  Completed   Hepatitis C Screening: USPSTF Recommendation to screen - Ages 18-79 yo.  Completed   HPV Vaccine  Aged Out  *Topic was postponed. The date shown is not the original due date.    Advanced directives: copies in chart   Conditions/risks identified: lose a little weight  Next appointment: Follow up in one year for your annual wellness visit.   Preventive Care 62 Years and Older, Male  Preventive care refers to lifestyle choices and visits with your health care provider that can promote health and wellness. What does preventive care include? A yearly physical exam. This is also called an annual well check. Dental exams once or twice a year. Routine eye exams. Ask your health care provider how often you should have your eyes checked. Personal lifestyle choices, including: Daily care of your teeth and gums. Regular physical activity. Eating a healthy diet. Avoiding tobacco and drug use. Limiting alcohol use. Practicing safe sex. Taking low doses of aspirin every day. Taking vitamin and mineral supplements as recommended by your health care provider. What happens during an annual well check? The services and screenings done by your health care provider during your annual well check will depend on your age, overall health, lifestyle  risk factors, and family history of disease. Counseling  Your health care provider may ask you questions about your: Alcohol use. Tobacco use. Drug use. Emotional well-being. Home and relationship well-being. Sexual activity. Eating habits. History of falls. Memory and ability to understand (cognition). Work and work Statistician. Screening  You may have the following tests or measurements: Height, weight, and BMI. Blood pressure. Lipid and cholesterol levels. These may be checked every 5 years, or more frequently if you are over 9 years old. Skin check. Lung cancer screening. You may have this screening every year starting at age 19 if you have a 30-pack-year history of smoking and currently smoke or have quit within the past 15 years. Fecal  occult blood test (FOBT) of the stool. You may have this test every year starting at age 37. Flexible sigmoidoscopy or colonoscopy. You may have a sigmoidoscopy every 5 years or a colonoscopy every 10 years starting at age 38. Prostate cancer screening. Recommendations will vary depending on your family history and other risks. Hepatitis C blood test. Hepatitis B blood test. Sexually transmitted disease (STD) testing. Diabetes screening. This is done by checking your blood sugar (glucose) after you have not eaten for a while (fasting). You may have this done every 1-3 years. Abdominal aortic aneurysm (AAA) screening. You may need this if you are a current or former smoker. Osteoporosis. You may be screened starting at age 43 if you are at high risk. Talk with your health care provider about your test results, treatment options, and if necessary, the need for more tests. Vaccines  Your health care provider may recommend certain vaccines, such as: Influenza vaccine. This is recommended every year. Tetanus, diphtheria, and acellular pertussis (Tdap, Td) vaccine. You may need a Td booster every 10 years. Zoster vaccine. You may need this after age  59. Pneumococcal 13-valent conjugate (PCV13) vaccine. One dose is recommended after age 23. Pneumococcal polysaccharide (PPSV23) vaccine. One dose is recommended after age 53. Talk to your health care provider about which screenings and vaccines you need and how often you need them. This information is not intended to replace advice given to you by your health care provider. Make sure you discuss any questions you have with your health care provider. Document Released: 07/10/2015 Document Revised: 03/02/2016 Document Reviewed: 04/14/2015 Elsevier Interactive Patient Education  2017 [place] Prevention in the Home Falls can cause injuries. They can happen to people of all ages. There are many things you can do to make your home safe and to help prevent falls. What can I do on the outside of my home? Regularly fix the edges of walkways and driveways and fix any cracks. Remove anything that might make you trip as you walk through a door, such as a raised step or threshold. Trim any bushes or trees on the path to your home. Use bright outdoor lighting. Clear any walking paths of anything that might make someone trip, such as rocks or tools. Regularly check to see if handrails are loose or broken. Make sure that both sides of any steps have handrails. Any raised decks and porches should have guardrails on the edges. Have any leaves, snow, or ice cleared regularly. Use sand or salt on walking paths during winter. Clean up any spills in your garage right away. This includes oil or grease spills. What can I do in the bathroom? Use night lights. Install grab bars by the toilet and in the tub and shower. Do not use towel bars as grab bars. Use non-skid mats or decals in the tub or shower. If you need to sit down in the shower, use a plastic, non-slip stool. Keep the floor dry. Clean up any water that spills on the floor as soon as it happens. Remove soap buildup in the tub or shower  regularly. Attach bath mats securely with double-sided non-slip rug tape. Do not have throw rugs and other things on the floor that can make you trip. What can I do in the bedroom? Use night lights. Make sure that you have a light by your bed that is easy to reach. Do not use any sheets or blankets that are too big for your bed. They should  not hang down onto the floor. Have a firm chair that has side arms. You can use this for support while you get dressed. Do not have throw rugs and other things on the floor that can make you trip. What can I do in the kitchen? Clean up any spills right away. Avoid walking on wet floors. Keep items that you use a lot in easy-to-reach places. If you need to reach something above you, use a strong step stool that has a grab bar. Keep electrical cords out of the way. Do not use floor polish or wax that makes floors slippery. If you must use wax, use non-skid floor wax. Do not have throw rugs and other things on the floor that can make you trip. What can I do with my stairs? Do not leave any items on the stairs. Make sure that there are handrails on both sides of the stairs and use them. Fix handrails that are broken or loose. Make sure that handrails are as long as the stairways. Check any carpeting to make sure that it is firmly attached to the stairs. Fix any carpet that is loose or worn. Avoid having throw rugs at the top or bottom of the stairs. If you do have throw rugs, attach them to the floor with carpet tape. Make sure that you have a light switch at the top of the stairs and the bottom of the stairs. If you do not have them, ask someone to add them for you. What else can I do to help prevent falls? Wear shoes that: Do not have high heels. Have rubber bottoms. Are comfortable and fit you well. Are closed at the toe. Do not wear sandals. If you use a stepladder: Make sure that it is fully opened. Do not climb a closed stepladder. Make sure that  both sides of the stepladder are locked into place. Ask someone to hold it for you, if possible. Clearly mark and make sure that you can see: Any grab bars or handrails. First and last steps. Where the edge of each step is. Use tools that help you move around (mobility aids) if they are needed. These include: Canes. Walkers. Scooters. Crutches. Turn on the lights when you go into a dark area. Replace any light bulbs as soon as they burn out. Set up your furniture so you have a clear path. Avoid moving your furniture around. If any of your floors are uneven, fix them. If there are any pets around you, be aware of where they are. Review your medicines with your doctor. Some medicines can make you feel dizzy. This can increase your chance of falling. Ask your doctor what other things that you can do to help prevent falls. This information is not intended to replace advice given to you by your health care provider. Make sure you discuss any questions you have with your health care provider. Document Released: 04/09/2009 Document Revised: 11/19/2015 Document Reviewed: 07/18/2014 Elsevier Interactive Patient Education  2017 [employer].

## 2022-03-24 NOTE — Progress Notes (Signed)
Virtual Visit via Telephone Note  I connected with  [name] on 03/24/22 at  8:30 AM EDT by telephone and verified that I am speaking with the correct person using two identifiers.  Medicare Annual Wellness visit completed telephonically due to Covid-19 pandemic.   Persons participating in this call: This Health Coach and this patient.   Location: Patient: home Provider: office    I discussed the limitations, risks, security and privacy concerns of performing an evaluation and management service by telephone and the availability of in person appointments. The patient expressed understanding and agreed to proceed.  Unable to perform video visit due to video visit attempted and failed and/or patient does not have video capability.   Some vital signs may be absent or patient reported.   Willette Brace, LPN   Subjective:   JARID SASSO is a 71 y.o. male who presents for Medicare Annual/Subsequent preventive examination.  Review of Systems     Cardiac Risk Factors include: advanced age (>28mn, >>18women);dyslipidemia;hypertension;diabetes mellitus;male gender     Objective:    There were no vitals filed for this visit. There is no height or weight on file to calculate BMI.     03/24/2022    8:19 AM 09/02/2021    9:41 AM 06/04/2021    7:43 PM 06/04/2021    4:26 PM 05/11/2021    7:47 PM 05/11/2021    5:19 PM 04/30/2021   10:46 AM  Advanced Directives  Does Patient Have a Medical Advance Directive? Yes Yes Yes No Yes Yes Yes  Type of [place]of [place] will [place] will [place] will  [place] will [place]Living will [place]Living will  Does patient want to make changes to medical advance directive? No - Patient declined  No - Patient declined  No - Patient declined    Copy of [place]ern[place]in Chart?  Yes - validated most recent copy scanned in chart (See row information) No - copy requested No - copy requested      Would patient like information on creating a medical advance directive?   No - Patient declined No - Patient declined       Current Medications (verified) Outpatient Encounter Medications as of 03/24/2022  Medication Sig   Accu-Chek Softclix Lancets lancets 1 each by Other route 3 (three) times daily.   atorvastatin (LIPITOR) 80 MG tablet TAKE 1 TABLET DAILY AT     6:00PM   Blood Glucose Monitoring Suppl (ACCU-CHEK AVIVA PLUS) w/Device KIT Check glucose TID or as needed E11.9   carvedilol (COREG) 3.125 MG tablet TAKE 1 TABLET TWICE DAILY  WITH MEALS   clopidogrel (PLAVIX) 75 MG tablet TAKE 1 TABLET DAILY   Coenzyme Q10 200 MG capsule Take 200 mg by mouth daily. gummie   famotidine-calcium carbonate-magnesium hydroxide (PEPCID COMPLETE) 10-800-165 MG chewable tablet Chew 0.5 tablets by mouth every 6 (six) hours as needed (heartburn).   ferrous sulfate 324 (65 Fe) MG TBEC Take 1 tablet a day   glucose blood test strip Use as instructed   Homeopathic Products (ZICAM ALLERGY RELIEF NA) Place 1 spray into the nose daily as needed (colds).   irbesartan (AVAPRO) 300 MG tablet TAKE 1 TABLET DAILY   metFORMIN (GLUCOPHAGE-XR) 500 MG 24 hr tablet TAKE 2 TABLETS DAILY AFTER SUPPER   Multiple Vitamins-Minerals (MULTIVITAMIN WITH MINERALS) tablet Take 1 tablet by mouth daily.  pantoprazole (PROTONIX) 40 MG tablet TAKE 1 TABLET TWICE DAILY  BEFORE MEALS   sildenafil (REVATIO) 20 MG tablet Take 1-5 tablets by mouth daily as needed.   sodium chloride (OCEAN) 0.65 % SOLN nasal spray Place 1 spray into both nostrils as needed for congestion.   spironolactone (ALDACTONE) 25 MG tablet TAKE 1 TABLET DAILY   Undecylenic Ac-Zn Undecylenate (FUNGI-NAIL TOE & FOOT EX) Apply 1 application topically See admin instructions. Apply to bilateral big toes every other day   vitamin B-12 (CYANOCOBALAMIN) 1000  MCG tablet Take 1,000 mcg by mouth daily.   nitroGLYCERIN (NITROSTAT) 0.4 MG SL tablet Place 1 tablet (0.4 mg total) under the tongue every 5 (five) minutes as needed for chest pain. (Patient not taking: Reported on 03/24/2022)   No facility-administered encounter medications on file as of 03/24/2022.    Allergies (verified) Aspirin, Oxycodone, Contrast media [iodinated contrast media], and Norvasc [amlodipine besylate]   History: Past Medical History:  Diagnosis Date   Adenomatous colon polyp 09/07/2004   Amblyopia    CAD (coronary artery disease)    cardiac CT with a 50% LAD lesion in 2007   Diverticulosis    DM2 (diabetes mellitus, type 2) (HCC)    Elevated LFTs    secondary to labetalol   Esophageal stricture 2003   External hemorrhoids 2012   lanced by Dr Rise Patience   Family history of anesthesia complication    "sister had PONV"   GERD (gastroesophageal reflux disease)    esophageal stricture, pmh   Hiatal hernia 01/04/2000   History of kidney stones 2022   HLD (hyperlipidemia)    HTN (hypertension)    Hypopotassemia    Past Surgical History:  Procedure Laterality Date   BIOPSY  06/05/2021   Procedure: BIOPSY;  Surgeon: Gatha Mayer, MD;  Location: [facility];  Service: Endoscopy;;   BIOPSY  09/02/2021   Procedure: BIOPSY;  Surgeon: [name]., MD;  Location: [place];  Service: Gastroenterology;;   COLONOSCOPY  06/27/2004   tics , ADENOMATOUS polyps. F/U due 2011   COLONOSCOPY  06/27/2010   Tics   COLONOSCOPY WITH PROPOFOL N/A 09/02/2021   Procedure: COLONOSCOPY WITH PROPOFOL;  Surgeon: Rush Landmark Telford Nab., MD;  Location: [place];  Service: Gastroenterology;  Laterality: N/A;   CORONARY ANGIOPLASTY WITH STENT PLACEMENT  04/01/2014   "2"   ENDOSCOPIC MUCOSAL RESECTION  09/02/2021   Procedure: ENDOSCOPIC MUCOSAL RESECTION;  Surgeon: Rush Landmark Telford Nab., MD;  Location: [place];  Service: Gastroenterology;;   ESOPHAGEAL DILATION   06/27/2001   ESOPHAGOGASTRODUODENOSCOPY  06/27/2001   ERD, stricture dilated   ESOPHAGOGASTRODUODENOSCOPY (EGD) WITH PROPOFOL N/A 06/05/2021   Procedure: ESOPHAGOGASTRODUODENOSCOPY (EGD) WITH PROPOFOL;  Surgeon: Gatha Mayer, MD;  [facility];  Service: Endoscopy;  Laterality: N/A;   ESOPHAGOGASTRODUODENOSCOPY (EGD) WITH PROPOFOL N/A 09/02/2021   Procedure: ESOPHAGOGASTRODUODENOSCOPY (EGD) WITH PROPOFOL;  Surgeon: Rush Landmark Telford Nab., MD;  Location: [place];  Service: Gastroenterology;  Laterality: N/A;   EXCISIONAL HEMORRHOIDECTOMY  ~ 2008   HEMOSTASIS CLIP PLACEMENT  09/02/2021   Procedure: HEMOSTASIS CLIP PLACEMENT;  Surgeon: [name]., MD;  Location: [place];  Service: Gastroenterology;;   HOT HEMOSTASIS N/A 09/02/2021   Procedure: HOT HEMOSTASIS (ARGON PLASMA COAGULATION/BICAP);  Surgeon: [name]., MD;  Location: [place];  Service: Gastroenterology;  Laterality: N/A;   LEFT HEART CATHETERIZATION WITH CORONARY ANGIOGRAM N/A 03/26/2014   Procedure: LEFT HEART CATHETERIZATION WITH CORONARY ANGIOGRAM;  Surgeon: Peter M Martinique, MD;  [facility] CATH LAB;  Service: Cardiovascular;  Laterality: N/A;   PERCUTANEOUS CORONARY STENT INTERVENTION (PCI-S) N/A 04/01/2014   Procedure: PERCUTANEOUS CORONARY STENT INTERVENTION (PCI-S);  Surgeon: Peter M Martinique, MD;  Location: [facility] CATH LAB;  Service: Cardiovascular;  Laterality: N/A;   POSTERIOR LAMINECTOMY / DECOMPRESSION LUMBAR SPINE  12/25/2009   L4 , Dr Sherwood Gambler   SHOULDER ARTHROSCOPY WITH ROTATOR CUFF REPAIR AND OPEN BICEPS TENODESIS Left 02/04/2019   Procedure: LEFT SHOULDER ARTHROSCOPY, BICEPS TENODESIS, MINI OPEN ROTATOR CUFF TEAR REPAIR;  Surgeon: Meredith Pel, MD;  Location: [place];  Service: Orthopedics;  Laterality: Left;   SUBMUCOSAL INJECTION  09/02/2021   Procedure: SUBMUCOSAL INJECTION;  Surgeon: Rush Landmark Telford Nab., MD;  Location: [facility] ENDOSCOPY;  Service:  Gastroenterology;;   TOTAL KNEE ARTHROPLASTY Right 05/11/2021   Procedure: TOTAL KNEE ARTHROPLASTY;  Surgeon: Paralee Cancel, MD;  Location: [facility];  Service: Orthopedics;  Laterality: Right;   Family History  Problem Relation Age of Onset   Heart attack Mother 63   Stroke Mother 44   Prostate cancer Father    Diabetes Father    Heart disease Father 22       CBAG    Heart disease Sister    Diabetes Brother    Heart attack Brother    Breast cancer Maternal Aunt    Heart attack Maternal Grandfather 74   Diabetes Paternal Grandmother    Stroke Paternal Grandfather 63   Stomach cancer Neg Hx    Rectal cancer Neg Hx    Esophageal cancer Neg Hx    Colon cancer Neg Hx    Inflammatory bowel disease Neg Hx    Liver disease Neg Hx    Pancreatic cancer Neg Hx    Social History   Socioeconomic History   Marital status: Single    Spouse name: Not on file   Number of children: 0   Years of education: 16   Highest education level: Not on file  Occupational History   Occupation: Retired   Tobacco Use   Smoking status: Never   Smokeless tobacco: Never  Vaping Use   Vaping Use: Never used  Substance and Sexual Activity   Alcohol use: Yes    Alcohol/week: 2.0 standard drinks of alcohol    Types: 2 Cans of beer per week    Comment: social   Drug use: No   Sexual activity: Not Currently  Other Topics Concern   Not on file  Social History Narrative   Fun: Designer, jewellery.    Social Determinants of Health   Financial Resource Strain: Low Risk  (03/24/2022)   Overall Financial Resource Strain (CARDIA)    Difficulty of Paying Living Expenses: Not hard at all  Food Insecurity: No Food Insecurity (03/24/2022)   Hunger Vital Sign    Worried About Running Out of Food in the Last Year: Never true    Ran Out of Food in the Last Year: Never true  Transportation Needs: No Transportation Needs (03/24/2022)   PRAPARE - Hydrologist (Medical): No    Lack of  Transportation (Non-Medical): No  Physical Activity: Sufficiently Active (03/24/2022)   Exercise Vital Sign    Days of Exercise per Week: 3 days    Minutes of Exercise per Session: 60 min  Stress: No Stress Concern Present (03/24/2022)   [place]    Feeling of Stress : Not at all  Social Connections: Socially Isolated (03/24/2022)   Social  Connection and Isolation Panel [NHANES]    Frequency of Communication with Friends and Family: More than three times a week    Frequency of Social Gatherings with Friends and Family: More than three times a week    Attends Religious Services: Never    Marine scientist or Organizations: No    Attends Music therapist: Never    Marital Status: Never married    Tobacco Counseling Counseling given: Not Answered   Clinical Intake:  Pre-visit preparation completed: Yes  Pain : No/denies pain     Nutritional Risks: None Diabetes: Yes CBG done?: No Did pt. bring in CBG monitor from home?: No  How often do you need to have someone help you when you read instructions, pamphlets, or other written materials from your doctor or pharmacy?: 1 - Never  Diabetic?Nutrition Risk Assessment:  Has the patient had any N/V/D within the last 2 months?  No  Does the patient have any non-healing wounds?  No  Has the patient had any unintentional weight loss or weight gain?  No   Diabetes:  Is the patient diabetic?  Yes  If diabetic, was a CBG obtained today?  No  Did the patient bring in their glucometer from home?  No  How often do you monitor your CBG's? N/A.   Financial Strains and Diabetes Management:  Are you having any financial strains with the device, your supplies or your medication? No .  Does the patient want to be seen by Chronic Care Management for management of their diabetes?  No  Would the patient like to be referred to a Nutritionist or for Diabetic  Management?  No   Diabetic Exams:  Diabetic Eye Exam: Completed 04/28/21 Diabetic Foot Exam: Overdue, Pt has been advised about the importance in completing this exam. Pt is scheduled for diabetic foot exam on next appt.   Interpreter Needed?: No  Information entered by :: Charlott Rakes, LPN   Activities of Daily Living    03/24/2022    8:21 AM 06/04/2021    7:47 PM  In your present state of health, do you have any difficulty performing the following activities:  Hearing? 0   Vision? 0   Difficulty concentrating or making decisions? 0   Walking or climbing stairs? 0   Dressing or bathing? 0   Doing errands, shopping? 0 1  Comment  new problem related to surgery  Preparing Food and eating ? N   Using the Toilet? N   In the past six months, have you accidently leaked urine? N   Do you have problems with loss of bowel control? N   Managing your Medications? N   Managing your Finances? N   Housekeeping or managing your Housekeeping? N     Patient Care Team: Vivi Barrack, MD as PCP - General (Family Medicine) Martinique, Peter M, MD as PCP - Cardiology (Cardiology) Shirley Muscat Loreen Freud, MD as Consulting Physician (Optometry) Marlou Sa, Tonna Corner, MD as Consulting Physician (Orthopedic Surgery) [facility] as Consulting Physician Edythe Clarity, [facility] (Pharmacist)  Indicate any recent Medical Services you may have received from other than [facility] providers in the past year (date may be approximate).     Assessment:   This is a routine wellness examination for [place].  Hearing/Vision screen Hearing Screening - Comments:: Pt denies any hearing issues  Vision Screening - Comments:: Pt follows up with Dr Shirleen Schirmer for annual eye exams   Dietary issues and exercise  activities discussed: Current Exercise Habits: Home exercise routine, Type of exercise: Other - see comments, Time (Minutes): 60, Frequency (Times/Week): 3, Weekly Exercise (Minutes/Week):  180   Goals Addressed             This Visit's Progress    Patient Stated       Get weight down a bit        Depression Screen    03/24/2022    8:17 AM 03/15/2022    9:36 AM 03/18/2021   10:12 AM 03/08/2021   10:59 AM 03/08/2021   10:03 AM 03/12/2020    9:41 AM 03/05/2019   11:34 AM  PHQ 2/9 Scores  PHQ - 2 Score 0 0 0 0 0 0 0  PHQ- 9 Score   0        Fall Risk    03/24/2022    8:20 AM 03/15/2022    9:36 AM 03/18/2021   10:14 AM 03/08/2021   10:03 AM 03/12/2020    9:43 AM  Fall Risk   Falls in the past year? 0 0 0 0 1  Number falls in past yr: 0 0 0 0 1  Injury with Fall? 0 0 0 0 1  Comment     feel off bike  Risk for fall due to : No Fall Risks;Impaired vision;Impaired balance/gait  No Fall Risks No Fall Risks History of fall(s);Impaired vision;Impaired balance/gait;Impaired mobility  Risk for fall due to: Comment     not like i use to be  Follow up Falls prevention discussed  Education provided Education provided Falls prevention discussed    [place]:  Any stairs in or around the home? Yes  If so, are there any without handrails? No  Home free of loose throw rugs in walkways, pet beds, electrical cords, etc? Yes  Adequate lighting in your home to reduce risk of falls? Yes   ASSISTIVE DEVICES UTILIZED TO PREVENT FALLS:  Life alert? No  Use of a cane, walker or w/c? No  Grab bars in the bathroom? No  Shower chair or bench in shower? Yes  Elevated toilet seat or a handicapped toilet? No   TIMED UP AND GO:  Was the test performed? No .   Cognitive Function:        03/24/2022    8:21 AM 03/18/2021   10:15 AM 03/12/2020    9:48 AM 01/31/2018   10:22 AM  6CIT Screen  What Year? 0 points 0 points 0 points 0 points  What month? 0 points 0 points 0 points 0 points  What time? 0 points 0 points  0 points  Count back from 20 0 points 0 points 0 points 0 points  Months in reverse 0 points 0 points 0 points 0 points  Repeat phrase  0 points 0 points 0 points   Total Score 0 points 0 points      Immunizations Immunization History  Administered Date(s) Administered   Fluad Quad(high Dose 65+) 02/21/2019, 04/07/2020, 03/08/2021, 03/15/2022   Influenza,inj,Quad PF,6+ Mos 03/19/2014   PFIZER(Purple Top)SARS-COV-2 Vaccination 08/01/2019, 08/27/2019, 03/25/2020, 03/12/2021   Pneumococcal Conjugate-13 01/17/2017   Pneumococcal Polysaccharide-23 01/31/2018   Td 12/18/2008   Tdap 04/07/2020   Zoster, Live 12/24/2012    TDAP status: Up to date  Flu Vaccine status: Up to date  Pneumococcal vaccine status: Up to date  Covid-19 vaccine status: Completed vaccines  Qualifies for Shingles Vaccine? Yes   Zostavax completed Yes   Shingrix  Completed?: No.    Education has been provided regarding the importance of this vaccine. Patient has been advised to call insurance company to determine out of pocket expense if they have not yet received this vaccine. Advised may also receive vaccine at local pharmacy or Health Dept. Verbalized acceptance and understanding.  Screening Tests Health Maintenance  Topic Date Due   FOOT EXAM  02/21/2020   COVID-19 Vaccine (5 - Pfizer risk series) 03/31/2022 (Originally 05/07/2021)   Zoster Vaccines- Shingrix (1 of 2) 06/14/2022 (Originally 01/21/1970)   OPHTHALMOLOGY EXAM  04/28/2022   HEMOGLOBIN A1C  09/13/2022   Diabetic kidney evaluation - GFR measurement  03/16/2023   Diabetic kidney evaluation - Urine ACR  03/16/2023   COLONOSCOPY (Pts 45-56yr Insurance coverage will need to be confirmed)  09/03/2026   TETANUS/TDAP  04/07/2030   Pneumonia Vaccine 72 Years old  Completed   INFLUENZA VACCINE  Completed   Hepatitis C Screening  Completed   HPV VACCINES  Aged Out    Health Maintenance  Health Maintenance Due  Topic Date Due   FOOT EXAM  02/21/2020    Colorectal cancer screening: Type of screening: Colonoscopy. Completed 09/02/21. Repeat every 5 years   Additional  Screening:  Hepatitis C Screening:  Completed 01/31/18  Vision Screening: Recommended annual ophthalmology exams for early detection of glaucoma and other disorders of the eye. Is the patient up to date with their annual eye exam?  No  Who is the provider or what is the name of the office in which the patient attends annual eye exams? Dr AShirleen Schirmer If pt is not established with a provider, would they like to be referred to a provider to establish care? No .   Dental Screening: Recommended annual dental exams for proper oral hygiene  Community Resource Referral / Chronic Care Management: CRR required this visit?  No   CCM required this visit?  No      Plan:     I have personally reviewed and noted the following in the patient's chart:   Medical and social history Use of alcohol, tobacco or illicit drugs  Current medications and supplements including opioid prescriptions. Patient is not currently taking opioid prescriptions. Functional ability and status Nutritional status Physical activity Advanced directives List of other physicians Hospitalizations, surgeries, and ER visits in previous 12 months Vitals Screenings to include cognitive, depression, and falls Referrals and appointments  In addition, I have reviewed and discussed with patient certain preventive protocols, quality metrics, and best practice recommendations. A written personalized care plan for preventive services as well as general preventive health recommendations were provided to patient.     TWillette Brace LPN   [id number]  Nurse Notes: none

## 2022-03-28 DIAGNOSIS — Z23 Encounter for immunization: Secondary | ICD-10-CM | POA: Diagnosis not present

## 2022-04-07 ENCOUNTER — Encounter (HOSPITAL_COMMUNITY): Payer: Self-pay | Admitting: Gastroenterology

## 2022-04-14 ENCOUNTER — Other Ambulatory Visit: Payer: Self-pay

## 2022-04-14 ENCOUNTER — Ambulatory Visit (HOSPITAL_COMMUNITY)
Admission: RE | Admit: 2022-04-14 | Discharge: 2022-04-14 | Disposition: A | Discharge status: Home / Self Care | Payer: Medicare Other | Attending: Gastroenterology | Admitting: Gastroenterology

## 2022-04-14 ENCOUNTER — Encounter (HOSPITAL_COMMUNITY)
Admission: RE | Disposition: A | Discharge status: Home / Self Care | Payer: Self-pay | Source: Home / Self Care | Attending: Gastroenterology

## 2022-04-14 ENCOUNTER — Encounter (HOSPITAL_COMMUNITY): Payer: Self-pay | Admitting: Gastroenterology

## 2022-04-14 ENCOUNTER — Ambulatory Visit (HOSPITAL_BASED_OUTPATIENT_CLINIC_OR_DEPARTMENT_OTHER): Payer: Medicare Other | Admitting: Certified Registered"

## 2022-04-14 ENCOUNTER — Ambulatory Visit (HOSPITAL_COMMUNITY): Payer: Medicare Other | Admitting: Certified Registered"

## 2022-04-14 DIAGNOSIS — K449 Diaphragmatic hernia without obstruction or gangrene: Secondary | ICD-10-CM | POA: Insufficient documentation

## 2022-04-14 DIAGNOSIS — Z09 Encounter for follow-up examination after completed treatment for conditions other than malignant neoplasm: Secondary | ICD-10-CM

## 2022-04-14 DIAGNOSIS — K5730 Diverticulosis of large intestine without perforation or abscess without bleeding: Secondary | ICD-10-CM

## 2022-04-14 DIAGNOSIS — Z9889 Other specified postprocedural states: Secondary | ICD-10-CM | POA: Diagnosis not present

## 2022-04-14 DIAGNOSIS — G709 Myoneural disorder, unspecified: Secondary | ICD-10-CM | POA: Diagnosis not present

## 2022-04-14 DIAGNOSIS — K5750 Diverticulosis of both small and large intestine without perforation or abscess without bleeding: Secondary | ICD-10-CM | POA: Insufficient documentation

## 2022-04-14 DIAGNOSIS — E119 Type 2 diabetes mellitus without complications: Secondary | ICD-10-CM | POA: Insufficient documentation

## 2022-04-14 DIAGNOSIS — K641 Second degree hemorrhoids: Secondary | ICD-10-CM | POA: Diagnosis not present

## 2022-04-14 DIAGNOSIS — K269 Duodenal ulcer, unspecified as acute or chronic, without hemorrhage or perforation: Secondary | ICD-10-CM

## 2022-04-14 DIAGNOSIS — K222 Esophageal obstruction: Secondary | ICD-10-CM | POA: Diagnosis not present

## 2022-04-14 DIAGNOSIS — K649 Unspecified hemorrhoids: Secondary | ICD-10-CM | POA: Diagnosis not present

## 2022-04-14 DIAGNOSIS — K644 Residual hemorrhoidal skin tags: Secondary | ICD-10-CM | POA: Insufficient documentation

## 2022-04-14 DIAGNOSIS — K5710 Diverticulosis of small intestine without perforation or abscess without bleeding: Secondary | ICD-10-CM | POA: Diagnosis not present

## 2022-04-14 DIAGNOSIS — I2510 Atherosclerotic heart disease of native coronary artery without angina pectoris: Secondary | ICD-10-CM | POA: Insufficient documentation

## 2022-04-14 DIAGNOSIS — Z955 Presence of coronary angioplasty implant and graft: Secondary | ICD-10-CM | POA: Insufficient documentation

## 2022-04-14 DIAGNOSIS — B3781 Candidal esophagitis: Secondary | ICD-10-CM | POA: Insufficient documentation

## 2022-04-14 DIAGNOSIS — Z7984 Long term (current) use of oral hypoglycemic drugs: Secondary | ICD-10-CM

## 2022-04-14 DIAGNOSIS — I10 Essential (primary) hypertension: Secondary | ICD-10-CM | POA: Insufficient documentation

## 2022-04-14 DIAGNOSIS — Z8711 Personal history of peptic ulcer disease: Secondary | ICD-10-CM | POA: Insufficient documentation

## 2022-04-14 DIAGNOSIS — K2289 Other specified disease of esophagus: Secondary | ICD-10-CM | POA: Insufficient documentation

## 2022-04-14 DIAGNOSIS — Z86010 Personal history of colonic polyps: Secondary | ICD-10-CM

## 2022-04-14 DIAGNOSIS — D122 Benign neoplasm of ascending colon: Secondary | ICD-10-CM

## 2022-04-14 HISTORY — PX: ESOPHAGOGASTRODUODENOSCOPY (EGD) WITH PROPOFOL: SHX5813

## 2022-04-14 HISTORY — PX: SUBMUCOSAL LIFTING INJECTION: SHX6855

## 2022-04-14 HISTORY — PX: HOT HEMOSTASIS: SHX5433

## 2022-04-14 HISTORY — PX: ENDOSCOPIC MUCOSAL RESECTION: SHX6839

## 2022-04-14 HISTORY — PX: COLONOSCOPY WITH PROPOFOL: SHX5780

## 2022-04-14 HISTORY — PX: HEMOSTASIS CONTROL: SHX6838

## 2022-04-14 HISTORY — PX: ESOPHAGEAL BRUSHING: SHX6842

## 2022-04-14 LAB — GLUCOSE, CAPILLARY: Glucose-Capillary: 118 mg/dL — ABNORMAL HIGH (ref 70–99)

## 2022-04-14 SURGERY — COLONOSCOPY WITH PROPOFOL
Anesthesia: Monitor Anesthesia Care

## 2022-04-14 MED ORDER — LACTATED RINGERS IV SOLN: INTRAVENOUS | Status: DC

## 2022-04-14 MED ORDER — PROPOFOL 500 MG/50ML IV EMUL
INTRAVENOUS | Status: DC | PRN
  Administered 2022-04-14: 125 ug/kg/min via INTRAVENOUS

## 2022-04-14 MED ORDER — CLOPIDOGREL BISULFATE 75 MG PO TABS: 75.0000 mg | ORAL_TABLET | Freq: Every day | ORAL | 1 refills | Status: DC

## 2022-04-14 MED ORDER — PROPOFOL 10 MG/ML IV BOLUS
INTRAVENOUS | Status: DC | PRN
  Administered 2022-04-14 (×2): 20 mg via INTRAVENOUS

## 2022-04-14 MED ORDER — OFLOXACIN 0.3 % OP SOLN: 2.0000 [drp] | Freq: Four times a day (QID) | OPHTHALMIC | 0 refills | Status: AC

## 2022-04-14 MED ORDER — SODIUM CHLORIDE 0.9 % IV SOLN: INTRAVENOUS | Status: DC

## 2022-04-14 MED ORDER — HYDRALAZINE HCL 20 MG/ML IJ SOLN
INTRAMUSCULAR | Status: AC
  Filled 2022-04-14: qty 1

## 2022-04-14 MED ORDER — HYDRALAZINE HCL 20 MG/ML IJ SOLN
10.0000 mg | Freq: Once | INTRAMUSCULAR | Status: AC
  Administered 2022-04-14: 10 mg via INTRAVENOUS

## 2022-04-14 SURGICAL SUPPLY — 25 items

## 2022-04-14 NOTE — Discharge Instructions (Signed)
YOU HAD AN ENDOSCOPIC PROCEDURE TODAY: Refer to the procedure report and other information in the discharge instructions given to you for any specific questions about what was found during the examination. If this information does not answer your questions, please call [place] office at [phone] to clarify.  ° °YOU SHOULD EXPECT: Some feelings of bloating in the abdomen. Passage of more gas than usual. Walking can help get rid of the air that was put into your GI tract during the procedure and reduce the bloating. If you had a lower endoscopy (such as a colonoscopy or flexible sigmoidoscopy) you may notice spotting of blood in your stool or on the toilet paper. Some abdominal soreness may be present for a day or two, also. ° °DIET: Your first meal following the procedure should be a light meal and then it is ok to progress to your normal diet. A half-sandwich or bowl of soup is an example of a good first meal. Heavy or fried foods are harder to digest and may make you feel nauseous or bloated. Drink plenty of fluids but you should avoid alcoholic beverages for 24 hours. If you had a esophageal dilation, please see attached instructions for diet.   ° °ACTIVITY: Your care partner should take you home directly after the procedure. You should plan to take it easy, moving slowly for the rest of the day. You can resume normal activity the day after the procedure however YOU SHOULD NOT DRIVE, use power tools, machinery or perform tasks that involve climbing or major physical exertion for 24 hours (because of the sedation medicines used during the test).  ° °SYMPTOMS TO REPORT IMMEDIATELY: °A gastroenterologist can be reached at any hour. Please call [phone]  for any of the following symptoms:  °Following lower endoscopy (colonoscopy, flexible sigmoidoscopy) °Excessive amounts of blood in the stool  °Significant tenderness, worsening of abdominal pains  °Swelling of the abdomen that is new, acute  °Fever of 100° or  higher  °Following upper endoscopy (EGD, EUS, ERCP, esophageal dilation) °Vomiting of blood or coffee ground material  °New, significant abdominal pain  °New, significant chest pain or pain under the shoulder blades  °Painful or persistently difficult swallowing  °New shortness of breath  °Black, tarry-looking or red, bloody stools ° °FOLLOW UP:  °If any biopsies were taken you will be contacted by phone or by letter within the next 1-3 weeks. Call [phone]  if you have not heard about the biopsies in 3 weeks.  °Please also call with any specific questions about appointments or follow up tests. ° °

## 2022-04-14 NOTE — Anesthesia Procedure Notes (Signed)
Procedure Name: MAC Date/Time: 04/14/2022 10:05 AM  Performed by: Niel Hummer, CRNAPre-anesthesia Checklist: Patient identified, Emergency Drugs available, Suction available and Patient being monitored Oxygen Delivery Method: Simple face mask

## 2022-04-14 NOTE — Op Note (Signed)
[facility] Patient Name: [name] [name] Procedure Date: 04/14/2022 MRN: [id number] Attending MD: Justice Britain , MD Date of Birth: 03-18-1951 CSN: [id number] Age: 71 Admit Type: Outpatient Procedure:                Colonoscopy Indications:              Surveillance: Personal history of piecemeal removal                            of adenoma with HGD on last colonoscopy (less than                            1 year ago) Providers:                Justice Britain, MD, Dulcy Fanny, [facility] Technician, Technician, Cletis Athens,                            Technician Referring MD:             Gatha Mayer, MD Medicines:                Monitored Anesthesia Care Complications:            No immediate complications. Estimated Blood Loss:     Estimated blood loss was minimal. Procedure:                Pre-Anesthesia Assessment:                           - Prior to the procedure, a History and Physical                            was performed, and patient medications and                            allergies were reviewed. The patient's tolerance of                            previous anesthesia was also reviewed. The risks                            and benefits of the procedure and the sedation                            options and risks were discussed with the patient.                            All questions were answered, and informed consent                            was obtained. Prior Anticoagulants: The patient has                            taken Plavix (  clopidogrel), last dose was 5 days                            prior to procedure. ASA Grade Assessment: III - A                            patient with severe systemic disease. After                            reviewing the risks and benefits, the patient was                            deemed in satisfactory condition to undergo the                            procedure.                            After obtaining informed consent, the colonoscope                            was passed under direct vision. Throughout the                            procedure, the patient's blood pressure, pulse, and                            oxygen saturations were monitored continuously. The                            CF-HQ190L ([id number] Olympus colonoscope was                            introduced through the anus and advanced to the 5                            cm into the ileum. The colonoscopy was technically                            difficult and complex. Successful completion of the                            procedure was aided by performing the maneuvers                            documented (below) in this report. The patient                            tolerated the procedure. The quality of the bowel                            preparation was adequate. The terminal ileum,  ileocecal valve, appendiceal orifice, and rectum                            were photographed. Scope In: 10:29:00 AM Scope Out: 11:50:54 AM Scope Withdrawal Time: 1 hour 14 minutes 30 seconds  Total Procedure Duration: 1 hour 21 minutes 54 seconds  Findings:      The digital rectal exam findings include hemorrhoids. Pertinent       negatives include no palpable rectal lesions.      A large amount of semi-liquid stool was found in the entire colon,       interfering with visualization. Lavage of the area was performed using       copious amounts, resulting in clearance with adequate visualization.      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 There was evidence of       recurrent polypoid tissue unfortunately, on at least 1/2 of the scar       site with some even moving proximally to the first fold beyond the IC       Valve. There was some mild oozing of the lesion, even before we began  to       work on it. Preparations were made for repeat attempt at resection. NBI       imaging and White-light endoscopy was done to demarcate the borders of       the lesion. Everlift was injected to raise the lesion with only partial       success. Piecemeal mucosal resection using a snare was performed.       Resection was incomplete however due to scarring. The resected tissue       was retrieved. Avulsion to ablate the lesion remnants by forceps was       successful. Subsequently, to the margin and the base of the resection,       we performed fulguration to ablate these areas by argon plasma (right       colon settings) which was successful. No evidence of ongoing bleeding       after this was completed. The defect was quite large as it had been       previously. I then pursued placement of Purastat hemostatic agent (total       of 3 mL) to decrease risk of post-interventional bleeding. Clips not       placed today.      Non-bleeding non-thrombosed external and internal hemorrhoids were found       during retroflexion, during perianal exam and during digital exam. The       hemorrhoids were Grade II (internal hemorrhoids that prolapse but reduce       spontaneously).      The OVESCO PROVE-IT Cap was then placed on the colonoscope and with time       was able to be maneuvered into the right colon. Impression:               - Hemorrhoids found on digital rectal exam.                           - Stool in the entire examined colon - lavaged with  adequate visualization.                           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 Unfortunately, there was a                            significant recurrence in over 50% of the region                            with mild oozing (pre-intervention). Piecemeal                            repeat EMR as noted above with Avulsion and then                             APC to the base/margin was performed. Too large of                            a defect for closure, so we proceeded with Purastat                            placement for hemostasis.                           - Non-bleeding non-thrombosed external and internal                            hemorrhoids. Moderate Sedation:      Not Applicable - Patient had care per Anesthesia. Recommendation:           - The patient will be observed post-procedure,                            until all discharge criteria are met.                           - Discharge patient to home.                           - Resume previous diet.                           - May restart Plavix on 10/22 PM to decrease risk                            of post-interventional bleeding.                           - Minimize NSAIDs use.                           - Continue present medications otherwise.                           -  Await pathology results. Pending no findings of                            malignancy. Repeat colonoscopy in 6-9 months for                            surveillance based on pathology results. OVESCO                            FTRD could be considered if this lesion has                            returned, hopefully that will not be the case.                           - Monitor for signs/symptoms of bleeidng,                            perforation, aspiration.                           - The findings and recommendations were discussed                            with the patient.                           - The findings and recommendations were discussed                            with the patient's family. Procedure Code(s):        --- Professional ---                           [phone], Colonoscopy, flexible; with endoscopic                            mucosal resection Diagnosis Code(s):        --- Professional ---                           K64.1, Second degree hemorrhoids                            Z98.890, Other specified postprocedural states                           Z09, Encounter for follow-up examination after                            completed treatment for conditions other than                            malignant neoplasm                           Z86.010, Personal  history of colonic polyps                           K57.30, Diverticulosis of large intestine without                            perforation or abscess without bleeding CPT copyright 2019 American [employer]. All rights reserved. The codes documented in this report are preliminary and upon coder review may  be revised to meet current compliance requirements. Justice Britain, MD 04/14/2022 12:16:52 PM Number of Addenda: 0

## 2022-04-14 NOTE — Anesthesia Preprocedure Evaluation (Signed)
Anesthesia Evaluation  Patient identified by MRN, date of birth, ID band Patient awake    Reviewed: Allergy & Precautions, NPO status , Patient's Chart, lab work & pertinent test results  History of Anesthesia Complications Negative for: history of anesthetic complications  Airway Mallampati: III  TM Distance: >3 FB Neck ROM: Full    Dental  (+) Teeth Intact, Dental Advisory Given   Pulmonary neg pulmonary ROS, neg shortness of breath, neg sleep apnea, neg COPD, neg recent URI,    breath sounds clear to auscultation       Cardiovascular hypertension, Pt. on medications and Pt. on home beta blockers + CAD and + Cardiac Stents  (-) CHF  Rhythm:Regular  2022 stress: ? The left ventricular ejection fraction is mildly decreased (45-54%). ? Nuclear stress EF: 49%. Mid anterior wall hypokinesis. ? There was no ST segment deviation noted during stress. ? Defect 1: There is a medium defect of severe severity present in the mid anterior location. ? Findings consistent with prior myocardial infarction. ? This is an intermediate risk study. Old infarct pattern noted in the mid inferior wall distribution. Prior LAD stent placed. No ischemia identified.   Candee Furbish, MD     Neuro/Psych  Neuromuscular disease negative psych ROS   GI/Hepatic Neg liver ROS, hiatal hernia, PUD, GERD  ,  Endo/Other  diabetes, Type 2Lab Results      Component                Value               Date                      HGBA1C                   7.3 (A)             03/15/2022             Renal/GU Renal diseaseLab Results      Component                Value               Date                      CREATININE               0.94                03/15/2022                Musculoskeletal   Abdominal   Peds  Hematology negative hematology ROS (+) Lab Results      Component                Value               Date                      WBC                       7.3                 03/15/2022                HGB                      14.1  03/15/2022                HCT                      41.9                03/15/2022                MCV                      98.2                03/15/2022                PLT                      208.0               03/15/2022              Anesthesia Other Findings   Reproductive/Obstetrics                             Anesthesia Physical Anesthesia Plan  ASA: 2  Anesthesia Plan: MAC   Post-op Pain Management: Minimal or no pain anticipated   Induction: Intravenous  PONV Risk Score and Plan: 1 and Propofol infusion and Treatment may vary due to age or medical condition  Airway Management Planned: Nasal Cannula and Natural Airway  Additional Equipment:   Intra-op Plan:   Post-operative Plan:   Informed Consent: I have reviewed the patients History and Physical, chart, labs and discussed the procedure including the risks, benefits and alternatives for the proposed anesthesia with the patient or authorized representative who has indicated his/her understanding and acceptance.     Dental advisory given  Plan Discussed with: CRNA  Anesthesia Plan Comments:         Anesthesia Quick Evaluation

## 2022-04-14 NOTE — Op Note (Signed)
[facility] Patient Name: [name] [name] Procedure Date: 04/14/2022 MRN: [id number] Attending MD: Justice Britain , MD Date of Birth: 09/05/1950 CSN: [id number] Age: 71 Admit Type: Outpatient Procedure:                Upper GI endoscopy Indications:              Follow-up of duodenal ulcer Providers:                Justice Britain, MD, Dulcy Fanny, [facility] Technician, Technician, Cletis Athens,                            Technician Referring MD:             Gatha Mayer, MD Medicines:                Monitored Anesthesia Care Complications:            No immediate complications. Estimated Blood Loss:     Estimated blood loss was minimal. Procedure:                Pre-Anesthesia Assessment:                           - Prior to the procedure, a History and Physical                            was performed, and patient medications and                            allergies were reviewed. The patient's tolerance of                            previous anesthesia was also reviewed. The risks                            and benefits of the procedure and the sedation                            options and risks were discussed with the patient.                            All questions were answered, and informed consent                            was obtained. Prior Anticoagulants: The patient has                            taken Plavix (clopidogrel), last dose was 5 days                            prior to procedure. ASA Grade Assessment: III - A  patient with severe systemic disease. After                            reviewing the risks and benefits, the patient was                            deemed in satisfactory condition to undergo the                            procedure.                           After obtaining informed consent, the endoscope was                            passed under direct vision.  Throughout the                            procedure, the patient's blood pressure, pulse, and                            oxygen saturations were monitored continuously. The                            [id number]-[id number] ([id number]) Olympus endoscope was introduced                            through the mouth, and advanced to the second part                            of duodenum. The upper GI endoscopy was                            accomplished without difficulty. The patient                            tolerated the procedure. Scope In: Scope Out: Findings:      No gross lesions were noted in the proximal esophagus and in the mid       esophagus.      White nummular lesions were noted in the distal esophagus. Brushings for       cytology were obtained to rule out Candida.      A widely patent Schatzki ring was found at the gastroesophageal junction.      The Z-line was regular and was found 41 cm from the incisors.      A 1 cm hiatal hernia was present.      No gross lesions were noted in the entire examined stomach.      A diverticulum was found in the second portion of the duodenum with       major papilla on the rim.      No other gross lesions were noted in the duodenal bulb, in the first       portion of the duodenum and in the second portion of the duodenum. Impression:               -  No gross lesions in esophagus proximally. White                            nummular lesions in esophageal mucosa distally -                            brushings performed.                           - Widely patent Schatzki ring.                           - Z-line regular, 41 cm from the incisors.                           - No gross lesions in the stomach.                           - 1 cm hiatal hernia.                           - No gross lesions in the duodenal bulb, in the                            first portion of the duodenum and in the second                            portion of the duodenum.                            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 Moderate Sedation:      Not Applicable - Patient had care per Anesthesia. Recommendation:           - Proceed to scheduled colonoscopy.                           - Continue present medications.                           - Observe patient's clinical course.                           - Await pathology results.                           - The findings and recommendations were discussed                            with the patient.                           - The findings and recommendations were discussed  with the patient's family. Procedure Code(s):        --- Professional ---                           [id number], Esophagogastroduodenoscopy, flexible,                            transoral; diagnostic, including collection of                            specimen(s) by brushing or washing, when performed                            (separate procedure) Diagnosis Code(s):        --- Professional ---                           K22.8, Other specified diseases of esophagus                           K22.2, Esophageal obstruction                           K44.9, Diaphragmatic hernia without obstruction or                            gangrene                           K26.9, Duodenal ulcer, unspecified as acute or                            chronic, without hemorrhage or perforation CPT copyright 2019 American Medical Association. All rights reserved. The codes documented in this report are preliminary and upon coder review may  be revised to meet current compliance requirements. Justice Britain, MD 04/14/2022 12:03:52 PM Number of Addenda: 0

## 2022-04-14 NOTE — Progress Notes (Signed)
Patient seen in recovery. Went over EGD and colonoscopy results. Those results are in the medical chart. Unfortunately, he seems to have gone a potential abrasion of his left eye in the setting of waking up. He has had some saline flushes but is still having some irritation there. I cannot visualize an overt corneal abrasion but we will go ahead and recommend the following: Dry eyes or teary eyes saline to be used every 2 hours to try to wash the left eye. He may continue this without issue as needed for the next 3 to 5 days. We will send in ofloxacin antibiotic eyedrops to be used for the next 3 to 5 days 4 times daily. Patient aware that if things progress or he has further issues he will need to be evaluated by his ophthalmologist/optometrist or come into the emergency department for further evaluation. I will have my team reach out to him to see how he is doing in the coming days and early next week.   Justice Britain, MD [facility] Office # [phone]

## 2022-04-14 NOTE — Transfer of Care (Signed)
Immediate Anesthesia Transfer of Care Note  Patient: [name] [name]  Procedure(s) Performed: COLONOSCOPY WITH PROPOFOL ENDOSCOPIC MUCOSAL RESECTION ESOPHAGOGASTRODUODENOSCOPY (EGD) WITH PROPOFOL ESOPHAGEAL BRUSHING SUBMUCOSAL LIFTING INJECTION POLYPECTOMY HOT HEMOSTASIS (ARGON PLASMA COAGULATION/BICAP) HEMOSTASIS CONTROL  Patient Location: PACU  Anesthesia Type:MAC  Level of Consciousness: drowsy  Airway & Oxygen Therapy: Patient Spontanous Breathing and Patient connected to face mask oxygen  Post-op Assessment: Report given to RN, Post -op Vital signs reviewed and stable and Patient moving all extremities X 4  Post vital signs: Reviewed and stable  Last Vitals:  Vitals Value Taken Time  BP 134/68   Temp    Pulse 48   Resp 10 04/14/22 1155  SpO2 100   Vitals shown include unvalidated device data.  Last Pain:  Vitals:   04/14/22 0913  TempSrc: Temporal  PainSc: 0-No pain         Complications: No notable events documented.

## 2022-04-14 NOTE — H&P (Signed)
GASTROENTEROLOGY PROCEDURE H&P NOTE   Primary Care Physician: Vivi Barrack, MD  HPI: [name] [name] is a 71 y.o. 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  Past Medical History:  Diagnosis Date   Adenomatous colon polyp 09/07/2004   Amblyopia    CAD (coronary artery disease)    cardiac CT with a 50% LAD lesion in 2007   Diverticulosis    DM2 (diabetes mellitus, type 2) (HCC)    Elevated LFTs    secondary to labetalol   Esophageal stricture 2003   External hemorrhoids 2012   lanced by Dr Rise Patience   Family history of anesthesia complication    "sister had PONV"   GERD (gastroesophageal reflux disease)    esophageal stricture, pmh   Hiatal hernia 01/04/2000   History of kidney stones 2022   HLD (hyperlipidemia)    HTN (hypertension)    Hypopotassemia    Past Surgical History:  Procedure Laterality Date   BIOPSY  06/05/2021   Procedure: BIOPSY;  Surgeon: Gatha Mayer, MD;  [facility];  Service: Endoscopy;;   BIOPSY  09/02/2021   Procedure: BIOPSY;  Surgeon: [name]., MD;  Location: [place];  Service: Gastroenterology;;   COLONOSCOPY  06/27/2004   tics , ADENOMATOUS polyps. F/U due 2011   COLONOSCOPY  06/27/2010   Tics   COLONOSCOPY WITH PROPOFOL N/A 09/02/2021   Procedure: COLONOSCOPY WITH PROPOFOL;  Surgeon: Rush Landmark Telford Nab., MD;  Location: [place];  Service: Gastroenterology;  Laterality: N/A;   CORONARY ANGIOPLASTY WITH STENT PLACEMENT  04/01/2014   "2"   ENDOSCOPIC MUCOSAL RESECTION  09/02/2021   Procedure: ENDOSCOPIC MUCOSAL RESECTION;  Surgeon: Rush Landmark Telford Nab., MD;  Location: [place];  Service: Gastroenterology;;   ESOPHAGEAL DILATION  06/27/2001   ESOPHAGOGASTRODUODENOSCOPY  06/27/2001   ERD, stricture dilated   ESOPHAGOGASTRODUODENOSCOPY (EGD) WITH PROPOFOL N/A 06/05/2021    Procedure: ESOPHAGOGASTRODUODENOSCOPY (EGD) WITH PROPOFOL;  Surgeon: Gatha Mayer, MD;  [facility];  Service: Endoscopy;  Laterality: N/A;   ESOPHAGOGASTRODUODENOSCOPY (EGD) WITH PROPOFOL N/A 09/02/2021   Procedure: ESOPHAGOGASTRODUODENOSCOPY (EGD) WITH PROPOFOL;  Surgeon: Rush Landmark Telford Nab., MD;  Location: [place];  Service: Gastroenterology;  Laterality: N/A;   EXCISIONAL HEMORRHOIDECTOMY  ~ 2008   HEMOSTASIS CLIP PLACEMENT  09/02/2021   Procedure: HEMOSTASIS CLIP PLACEMENT;  Surgeon: [name]., MD;  Location: [place];  Service: Gastroenterology;;   HOT HEMOSTASIS N/A 09/02/2021   Procedure: HOT HEMOSTASIS (ARGON PLASMA COAGULATION/BICAP);  Surgeon: [name]., MD;  Location: [place];  Service: Gastroenterology;  Laterality: N/A;   LEFT HEART CATHETERIZATION WITH CORONARY ANGIOGRAM N/A 03/26/2014   Procedure: LEFT HEART CATHETERIZATION WITH CORONARY ANGIOGRAM;  Surgeon: Peter M Martinique, MD;  Location: [facility] CATH LAB;  Service: Cardiovascular;  Laterality: N/A;   PERCUTANEOUS CORONARY STENT INTERVENTION (PCI-S) N/A 04/01/2014   Procedure: PERCUTANEOUS CORONARY STENT INTERVENTION (PCI-S);  Surgeon: Peter M Martinique, MD;  Location: [facility] CATH LAB;  Service: Cardiovascular;  Laterality: N/A;   POSTERIOR LAMINECTOMY / DECOMPRESSION LUMBAR SPINE  12/25/2009   L4 , Dr Sherwood Gambler   SHOULDER ARTHROSCOPY WITH ROTATOR CUFF REPAIR AND OPEN BICEPS TENODESIS Left 02/04/2019   Procedure: LEFT SHOULDER ARTHROSCOPY, BICEPS TENODESIS, MINI OPEN ROTATOR CUFF TEAR REPAIR;  Surgeon: Meredith Pel, MD;  Location: [place];  Service: Orthopedics;  Laterality: Left;   SUBMUCOSAL INJECTION  09/02/2021   Procedure: SUBMUCOSAL INJECTION;  Surgeon:  Mansouraty, Telford Nab., MD;  Location: [place];  Service: Gastroenterology;;   TOTAL KNEE ARTHROPLASTY Right 05/11/2021   Procedure: TOTAL KNEE ARTHROPLASTY;  Surgeon: Paralee Cancel, MD;  [facility];  Service: Orthopedics;  Laterality: Right;   Current Facility-Administered Medications  Medication Dose Route Frequency Provider Last Rate Last Admin   0.9 %  sodium chloride infusion   Intravenous Continuous Mansouraty, Telford Nab., MD       lactated ringers infusion   Intravenous Continuous Mansouraty, Telford Nab., MD 10 mL/hr at 04/14/22 0937 New Bag at 04/14/22 0937    Current Facility-Administered Medications:    0.9 %  sodium chloride infusion, , Intravenous, Continuous, Mansouraty, Telford Nab., MD   lactated ringers infusion, , Intravenous, Continuous, Mansouraty, Telford Nab., MD, Last Rate: 10 mL/hr at 04/14/22 0937, New Bag at 04/14/22 1761 Allergies  Allergen Reactions   Aspirin Hives, Swelling and Other (See Comments)    Angioedema   Oxycodone Nausea Only   Contrast Media [Iodinated Contrast Media] Hives, Itching, Rash and Other (See Comments)    Developed hives despite pre-treatment   Norvasc [Amlodipine Besylate] Other (See Comments) and Cough    09/12/2013 gingival hyperplasia , Dr Geralynn Ochs ,DDS   Family History  Problem Relation Age of Onset   Heart attack Mother 36   Stroke Mother 2   Prostate cancer Father    Diabetes Father    Heart disease Father 35       CBAG    Heart disease Sister    Diabetes Brother    Heart attack Brother    Breast cancer Maternal Aunt    Heart attack Maternal Grandfather 48   Diabetes Paternal Grandmother    Stroke Paternal Grandfather 6   Stomach cancer Neg Hx    Rectal cancer Neg Hx    Esophageal cancer Neg Hx    Colon cancer Neg Hx    Inflammatory bowel disease Neg Hx    Liver disease Neg Hx    Pancreatic cancer Neg Hx    Social History   Socioeconomic History   Marital status: Single    Spouse name: Not on file   Number of children: 0   Years of education: 16   Highest education level: Not on file  Occupational History   Occupation: Retired   Tobacco Use   Smoking status: Never   Smokeless tobacco: Never  Vaping  Use   Vaping Use: Never used  Substance and Sexual Activity   Alcohol use: Yes    Alcohol/week: 2.0 standard drinks of alcohol    Types: 2 Cans of beer per week    Comment: social   Drug use: No   Sexual activity: Not Currently  Other Topics Concern   Not on file  Social History Narrative   Fun: Designer, jewellery.    Social Determinants of Health   Financial Resource Strain: Low Risk  (03/24/2022)   Overall Financial Resource Strain (CARDIA)    Difficulty of Paying Living Expenses: Not hard at all  Food Insecurity: No Food Insecurity (03/24/2022)   Hunger Vital Sign    Worried About Running Out of Food in the Last Year: Never true    Ran Out of Food in the Last Year: Never true  Transportation Needs: No Transportation Needs (03/24/2022)   PRAPARE - Hydrologist (Medical): No    Lack of Transportation (Non-Medical): No  Physical Activity: Sufficiently Active (03/24/2022)   Exercise Vital Sign    Days  of Exercise per Week: 3 days    Minutes of Exercise per Session: 60 min  Stress: No Stress Concern Present (03/24/2022)   [place]    Feeling of Stress : Not at all  Social Connections: Socially Isolated (03/24/2022)   Social Connection and Isolation Panel [NHANES]    Frequency of Communication with Friends and Family: More than three times a week    Frequency of Social Gatherings with Friends and Family: More than three times a week    Attends Religious Services: Never    Marine scientist or Organizations: No    Attends Archivist Meetings: Never    Marital Status: Never married  Intimate Partner Violence: Not At Risk (03/24/2022)   Humiliation, Afraid, Rape, and Kick questionnaire    Fear of Current or Ex-Partner: No    Emotionally Abused: No    Physically Abused: No    Sexually Abused: No    Physical Exam: Today's Vitals   04/07/22 1532 04/14/22 0913  BP:  (!)  192/95  Pulse:  60  Resp:  18  Temp:  (!) 97.1 F (36.2 C)  TempSrc:  Temporal  SpO2:  99%  Weight: 83 kg 81.6 kg  Height:  '5\' 8"'$  (1.727 m)  PainSc:  0-No pain   Body mass index is 27.37 kg/m. GEN: NAD EYE: Sclerae anicteric ENT: MMM CV: Non-tachycardic GI: Soft, NT/ND NEURO:  Alert & Oriented x 3  Lab Results: No results for input(s): "WBC", "HGB", "HCT", "PLT" in the last 72 hours. BMET No results for input(s): "NA", "K", "CL", "CO2", "GLUCOSE", "BUN", "CREATININE", "CALCIUM" in the last 72 hours. LFT No results for input(s): "PROT", "ALBUMIN", "AST", "ALT", "ALKPHOS", "BILITOT", "BILIDIR", "IBILI" in the last 72 hours. PT/INR No results for input(s): "LABPROT", "INR" in the last 72 hours.   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  The risks and benefits of endoscopic evaluation/treatment were discussed with the patient and/or family; these include but are not limited to the risk of perforation, infection, bleeding, missed lesions, lack of diagnosis, severe illness requiring hospitalization, as well as anesthesia and sedation related illnesses.  The patient's history has been reviewed, patient examined, no change in status, and deemed stable for procedure.  The patient and/or family is agreeable to proceed.    Justice Britain, MD [facility] Office # [phone]

## 2022-04-15 ENCOUNTER — Encounter: Payer: Self-pay | Admitting: Gastroenterology

## 2022-04-15 LAB — CYTOLOGY - NON PAP

## 2022-04-15 LAB — SURGICAL PATHOLOGY

## 2022-04-15 NOTE — Anesthesia Postprocedure Evaluation (Signed)
Anesthesia Post Note  Patient: [name] [name]  Procedure(s) Performed: COLONOSCOPY WITH PROPOFOL ENDOSCOPIC MUCOSAL RESECTION ESOPHAGOGASTRODUODENOSCOPY (EGD) WITH PROPOFOL ESOPHAGEAL BRUSHING SUBMUCOSAL LIFTING INJECTION POLYPECTOMY HOT HEMOSTASIS (ARGON PLASMA COAGULATION/BICAP) HEMOSTASIS CONTROL     Patient location during evaluation: Endoscopy Anesthesia Type: MAC Level of consciousness: awake and alert Pain management: pain level controlled Vital Signs Assessment: post-procedure vital signs reviewed and stable Respiratory status: spontaneous breathing, nonlabored ventilation and respiratory function stable Cardiovascular status: stable and blood pressure returned to baseline Postop Assessment: no apparent nausea or vomiting Anesthetic complications: no   No notable events documented.  Last Vitals:  Vitals:   04/14/22 1253 04/14/22 1255  BP: (!) 202/76 (!) 189/76  Pulse:  (!) 44  Resp:  20  Temp:    SpO2:  100%    Last Pain:  Vitals:   04/14/22 1255  TempSrc:   PainSc: 7                  Jamieka Royle

## 2022-04-18 ENCOUNTER — Encounter (HOSPITAL_COMMUNITY): Payer: Self-pay | Admitting: Gastroenterology

## 2022-04-18 ENCOUNTER — Other Ambulatory Visit: Payer: Self-pay

## 2022-04-18 MED ORDER — FLUCONAZOLE 200 MG PO TABS: ORAL_TABLET | ORAL | 0 refills | Status: AC

## 2022-05-06 DIAGNOSIS — H40013 Open angle with borderline findings, low risk, bilateral: Secondary | ICD-10-CM | POA: Diagnosis not present

## 2022-05-06 DIAGNOSIS — H53001 Unspecified amblyopia, right eye: Secondary | ICD-10-CM | POA: Diagnosis not present

## 2022-05-06 DIAGNOSIS — E119 Type 2 diabetes mellitus without complications: Secondary | ICD-10-CM | POA: Diagnosis not present

## 2022-05-06 DIAGNOSIS — H25813 Combined forms of age-related cataract, bilateral: Secondary | ICD-10-CM | POA: Diagnosis not present

## 2022-05-06 DIAGNOSIS — H04123 Dry eye syndrome of bilateral lacrimal glands: Secondary | ICD-10-CM | POA: Diagnosis not present

## 2022-05-06 LAB — HM DIABETES EYE EXAM

## 2022-05-13 ENCOUNTER — Encounter: Payer: Self-pay | Admitting: Family Medicine

## 2022-05-26 ENCOUNTER — Encounter: Payer: Self-pay | Admitting: Surgery

## 2022-05-26 ENCOUNTER — Ambulatory Visit: Payer: Self-pay

## 2022-05-26 ENCOUNTER — Ambulatory Visit (INDEPENDENT_AMBULATORY_CARE_PROVIDER_SITE_OTHER): Payer: Medicare Other | Admitting: Surgery

## 2022-05-26 ENCOUNTER — Telehealth: Payer: Self-pay | Admitting: Family Medicine

## 2022-05-26 VITALS — BP 163/102 | HR 82 | Ht 68.0 in | Wt 180.0 lb

## 2022-05-26 DIAGNOSIS — M4722 Other spondylosis with radiculopathy, cervical region: Secondary | ICD-10-CM | POA: Diagnosis not present

## 2022-05-26 DIAGNOSIS — M79602 Pain in left arm: Secondary | ICD-10-CM | POA: Diagnosis not present

## 2022-05-26 DIAGNOSIS — I25118 Atherosclerotic heart disease of native coronary artery with other forms of angina pectoris: Secondary | ICD-10-CM

## 2022-05-26 MED ORDER — METHOCARBAMOL 500 MG PO TABS: 500.0000 mg | ORAL_TABLET | Freq: Three times a day (TID) | ORAL | 0 refills | Status: DC | PRN

## 2022-05-26 NOTE — Telephone Encounter (Signed)
Pt states: -he has a pinched nerve in left arm.  -Orthopaedic PA took xray, space in neck looks narrow.  -PA Plan of care is prednisone for pain relief, muscle relaxant (already prescribed) and physical therapy. -PA is unsure if prednisone is appropriate for patient because of diabetes.  Pt Asks: -PCP team to look at recent visit notes with Orthopaedic PA and follow up or order prednisone for patient as appropriate.   Preferred pharmacy:  [facility] [id number], [place], [place] [address] Phone: [phone]  Fax: [phone]

## 2022-05-26 NOTE — Progress Notes (Signed)
Office Visit Note   Patient: [name] [name]           Date of Birth: Jul 16, 1950           MRN: [id number] Visit Date: 05/26/2022              Requested by: Vivi Barrack, MD [address] [place],  [place] [address] PCP: Vivi Barrack, MD   Assessment & Plan: Visit Diagnoses:  1. Left arm pain   2. Other spondylosis with radiculopathy, cervical region     Plan: I am somewhat limited as to what medications I can give patient.  He is unable to take oral NSAIDs due to being on Plavix and I do not want to give a prednisone taper with his history of diabetes.  I did advise him that he can contact his primary care provider to see if they are comfortable prescribing a prednisone taper to see if this helps relieve his radicular symptoms.  I sent in prescription for Robaxin for spasms.  Will order formal PT for a few weeks.  Follow-up in 3 weeks with Dr. Laurance Flatten for recheck.  If he continues to be symptomatic he will likely need cervical spine MRI throughout HNP/stenosis.  Follow-Up Instructions: Return in about 3 weeks (around 06/16/2022) for WITH DR MOORE RECHECK NECK PAIN AND LEFT UE RADICULOPATHY.   Orders:  Orders Placed This Encounter  Procedures   XR Cervical Spine 2 or 3 views   XR Shoulder Left   No orders of the defined types were placed in this encounter.     Procedures: No procedures performed   Clinical Data: No additional findings.   Subjective: Chief Complaint  Patient presents with   Left Arm - Pain    HPI 71 year old white male is new patient to clinic comes in with complaints of neck stiffness and 5 days of left upper extremity radiculopathy.  Patient states he has had off-and-on neck stiffness for years but the left arm symptoms are new.  No injury.  States that he has pain numbness and tingling rating down his arm to the ulnar aspect of his left hand.  No right arm symptoms.  If he bends forward at times with his neck flexed that increases the pain going down  his arm.  Patient unable to take oral NSAIDs due to him being on Plavix.  History of heart stents.  He had previous left shoulder scope with rotator cuff repair by Dr. Marlou Sa a couple years ago. Review of Systems No current complaints of cardiopulmonary GI/GU issues  Objective: Vital Signs: BP (!) 163/102 (BP Location: Right Arm, Patient Position: Sitting, Cuff Size: Large)   Pulse 82   Ht '5\' 8"'$  (1.727 m)   Wt 180 lb (81.6 kg)   BMI 27.37 kg/m   Physical Exam HENT:     Head: Normocephalic and atraumatic.     Nose: Nose normal.  Eyes:     Extraocular Movements: Extraocular movements intact.  Pulmonary:     Effort: No respiratory distress.  Musculoskeletal:     Comments: Patient can service by range of motion.  Positive left brachial plexus and trapezius tenderness.  Negative on the right side.  Right shoulder has good range of motion.  Negative impingement test.  Left shoulder nontender.  Bilateral elbows good range of motion.  Negative Tinel's over the cubital tunnel.  Negative Tinel's over the carpal tunnel.  No focal motor deficits.  Neurological:     Mental Status: He  is alert and oriented to person, place, and time.  Psychiatric:        Mood and Affect: Mood normal.     Ortho Exam  Specialty Comments:  No specialty comments available.  Imaging: No results found.   PMFS History: Patient Active Problem List   Diagnosis Date Noted   IDA (iron deficiency anemia) 08/31/2021   Abnormal colonoscopy 08/29/2021   Tubular adenoma of ascending colon 08/29/2021   Hx of adenomatous colonic polyps 08/29/2021   PUD (peptic ulcer disease) 08/10/2021   S/P total knee arthroplasty, 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   Nephrolithiasis 03/05/2020   Labral tear of long head of biceps tendon, left, sequela    Complete tear of left rotator cuff    Antiplatelet or antithrombotic long-term use 01/17/2017   Diverticulosis of colon (without mention of hemorrhage) 04/25/2011   Adenomatous colon polyp  03/17/2011   LOW BACK PAIN, CHRONIC 12/13/2007   Hyperlipidemia associated with type 2 diabetes mellitus ([place]) 06/08/2007   T2DM (type 2 diabetes mellitus) ([place]) 02/27/2007   Hypertension associated with diabetes ([place]) 02/27/2007   AMBLYOPIA 12/06/2006   GERD 12/06/2006   Past Medical History:  Diagnosis Date   Adenomatous colon polyp 09/07/2004   Amblyopia    CAD (coronary artery disease)    cardiac CT with a 50% LAD lesion in 2007   Diverticulosis    DM2 (diabetes mellitus, type 2) (HCC)    Elevated LFTs    secondary to labetalol   Esophageal stricture 2003   External hemorrhoids 2012   lanced by Dr Rise Patience   Family history of anesthesia complication    "sister had PONV"   GERD (gastroesophageal reflux disease)    esophageal stricture, pmh   Hiatal hernia 01/04/2000   History of kidney stones 2022   HLD (hyperlipidemia)    HTN (hypertension)    Hypopotassemia     Family History  Problem Relation Age of Onset   Heart attack Mother 76   Stroke Mother 34   Prostate cancer Father    Diabetes Father    Heart disease Father 19       CBAG    Heart disease Sister    Diabetes Brother    Heart attack Brother    Breast cancer Maternal Aunt    Heart attack Maternal Grandfather 61   Diabetes Paternal Grandmother    Stroke Paternal Grandfather 87   Stomach cancer Neg Hx    Rectal cancer Neg Hx    Esophageal cancer Neg Hx    Colon cancer Neg Hx    Inflammatory bowel disease Neg Hx    Liver disease Neg Hx    Pancreatic cancer Neg Hx     Past Surgical History:  Procedure Laterality Date   BIOPSY  06/05/2021   Procedure: BIOPSY;  Surgeon: Gatha Mayer, MD;  [facility];  Service: Endoscopy;;   BIOPSY  09/02/2021   Procedure: BIOPSY;  Surgeon: [name]., MD;  Location: [facility] ENDOSCOPY;  Service: Gastroenterology;;   COLONOSCOPY  06/27/2004   tics , ADENOMATOUS polyps. F/U due 2011   COLONOSCOPY  06/27/2010   Tics   COLONOSCOPY WITH PROPOFOL N/A  09/02/2021   Procedure: COLONOSCOPY WITH PROPOFOL;  Surgeon: Rush Landmark Telford Nab., MD;  Location: [place];  Service: Gastroenterology;  Laterality: N/A;   COLONOSCOPY WITH PROPOFOL N/A 04/14/2022   Procedure: COLONOSCOPY WITH PROPOFOL;  Surgeon: Rush Landmark Telford Nab., MD;  [facility];  Service: Gastroenterology;  Laterality: N/A;   CORONARY ANGIOPLASTY WITH STENT PLACEMENT  04/01/2014   "2"   ENDOSCOPIC MUCOSAL RESECTION  09/02/2021   Procedure: ENDOSCOPIC MUCOSAL RESECTION;  Surgeon: Rush Landmark Telford Nab., MD;  Location: [place];  Service: Gastroenterology;;   ENDOSCOPIC MUCOSAL RESECTION N/A 04/14/2022   Procedure: ENDOSCOPIC MUCOSAL RESECTION;  Surgeon: Rush Landmark Telford Nab., MD;  Location: [facility];  Service: Gastroenterology;  Laterality: N/A;   ESOPHAGEAL BRUSHING  04/14/2022   Procedure: ESOPHAGEAL BRUSHING;  Surgeon: [name]., MD;  [facility];  Service: Gastroenterology;;   ESOPHAGEAL DILATION  06/27/2001   ESOPHAGOGASTRODUODENOSCOPY  06/27/2001   ERD, stricture dilated   ESOPHAGOGASTRODUODENOSCOPY (EGD) WITH PROPOFOL N/A 06/05/2021   Procedure: ESOPHAGOGASTRODUODENOSCOPY (EGD) WITH PROPOFOL;  Surgeon: Gatha Mayer, MD;  [facility];  Service: Endoscopy;  Laterality: N/A;   ESOPHAGOGASTRODUODENOSCOPY (EGD) WITH PROPOFOL N/A 09/02/2021   Procedure: ESOPHAGOGASTRODUODENOSCOPY (EGD) WITH PROPOFOL;  Surgeon: Rush Landmark Telford Nab., MD;  Location: [place];  Service: Gastroenterology;  Laterality: N/A;   ESOPHAGOGASTRODUODENOSCOPY (EGD) WITH PROPOFOL N/A 04/14/2022   Procedure: ESOPHAGOGASTRODUODENOSCOPY (EGD) WITH PROPOFOL;  Surgeon: Rush Landmark Telford Nab., MD;  [facility];  Service: Gastroenterology;  Laterality: N/A;   EXCISIONAL HEMORRHOIDECTOMY  ~ 2008   HEMOSTASIS CLIP PLACEMENT  09/02/2021   Procedure: HEMOSTASIS CLIP PLACEMENT;  Surgeon: Rush Landmark Telford Nab., MD;  Location: [place];   Service: Gastroenterology;;   HEMOSTASIS CONTROL  04/14/2022   Procedure: HEMOSTASIS CONTROL;  Surgeon: [name]., MD;  Location: [facility];  Service: Gastroenterology;;   HOT HEMOSTASIS N/A 09/02/2021   Procedure: HOT HEMOSTASIS (ARGON PLASMA COAGULATION/BICAP);  Surgeon: [name]., MD;  Location: [place];  Service: Gastroenterology;  Laterality: N/A;   HOT HEMOSTASIS N/A 04/14/2022   Procedure: HOT HEMOSTASIS (ARGON PLASMA COAGULATION/BICAP);  Surgeon: [name]., MD;  Location: [facility];  Service: Gastroenterology;  Laterality: N/A;   LEFT HEART CATHETERIZATION WITH CORONARY ANGIOGRAM N/A 03/26/2014   Procedure: LEFT HEART CATHETERIZATION WITH CORONARY ANGIOGRAM;  Surgeon: Peter M Martinique, MD;  Location: [facility] LAB;  Service: Cardiovascular;  Laterality: N/A;   PERCUTANEOUS CORONARY STENT INTERVENTION (PCI-S) N/A 04/01/2014   Procedure: PERCUTANEOUS CORONARY STENT INTERVENTION (PCI-S);  Surgeon: Peter M Martinique, MD;  Location: [facility];  Service: Cardiovascular;  Laterality: N/A;   POSTERIOR LAMINECTOMY / DECOMPRESSION LUMBAR SPINE  12/25/2009   L4 , Dr Sherwood Gambler   SHOULDER ARTHROSCOPY WITH ROTATOR CUFF REPAIR AND OPEN BICEPS TENODESIS Left 02/04/2019   Procedure: LEFT SHOULDER ARTHROSCOPY, BICEPS TENODESIS, MINI OPEN ROTATOR CUFF TEAR REPAIR;  Surgeon: Meredith Pel, MD;  Location: [place];  Service: Orthopedics;  Laterality: Left;   SUBMUCOSAL INJECTION  09/02/2021   Procedure: SUBMUCOSAL INJECTION;  Surgeon: Rush Landmark Telford Nab., MD;  Location: [place];  Service: Gastroenterology;;   SUBMUCOSAL LIFTING INJECTION  04/14/2022   Procedure: SUBMUCOSAL LIFTING INJECTION;  Surgeon: [name]., MD;  Location: [facility];  Service: Gastroenterology;;   TOTAL KNEE ARTHROPLASTY Right 05/11/2021   Procedure: TOTAL KNEE ARTHROPLASTY;  Surgeon: Paralee Cancel, MD;  [facility];  Service: Orthopedics;   Laterality: Right;   Social History   Occupational History   Occupation: Retired   Tobacco Use   Smoking status: Never   Smokeless tobacco: Never  Vaping Use   Vaping Use: Never used  Substance and Sexual Activity   Alcohol use: Yes    Alcohol/week: 2.0 standard drinks of alcohol    Types: 2 Cans of beer per week    Comment: social   Drug use: No   Sexual activity: Not  Currently

## 2022-05-27 ENCOUNTER — Telehealth: Payer: Self-pay | Admitting: Surgery

## 2022-05-27 ENCOUNTER — Telehealth: Payer: Self-pay | Admitting: Cardiology

## 2022-05-27 ENCOUNTER — Other Ambulatory Visit: Payer: Self-pay | Admitting: Family Medicine

## 2022-05-27 NOTE — Telephone Encounter (Signed)
Pt c/o of Chest Pain: STAT if CP now or developed within 24 hours  1. Are you having CP right now?      2. Are you experiencing any other symptoms (ex. SOB, nausea, vomiting, sweating)? No  3. How long have you been experiencing CP?   5 days  4. Is your CP continuous or coming and going?   Coming and going but continuous when laying down  5. Have you taken Nitroglycerin?   Patient stated he is having shooting pain in his left arm from wrist to arm pit, also on outside of arm.  The patient thinks it may be a pinched nerve but wanted to check if this could be heart related.  Patient noted he gets this pain when he bends over or coughs. ?

## 2022-05-27 NOTE — Telephone Encounter (Signed)
Please advise on message below. Thank you!

## 2022-05-27 NOTE — Telephone Encounter (Signed)
Patient called. Says the PCP would like Jeneen Rinks to call in the prednisone for him. His call back number is [phone]

## 2022-05-27 NOTE — Telephone Encounter (Signed)
Spoke to patient he stated he has been having left arm pain off and on the past 5 days.Stated he saw orthopaedic Dr.Neck xray revealed compression.He is waiting on a call to start PT.Stated he would like to see Dr.Jordan to make sure pain is not heart related.Stated his B/P has been elevated.Appointment scheduled with Dr.Jordan 12/6 at 9:30 am.He will bring B/P readings to appointment.

## 2022-05-27 NOTE — Telephone Encounter (Signed)
Spoke with patient, patient notified PCP recommendation  Prednisone for a few days should be fine in terms of his blood sugar. He will need to get an rx from the orthopedist for this. I cannot send in a prescription without evaluating him first. Patient stated will call orthopedist and see if he can send Rx If not will schedule OV

## 2022-05-27 NOTE — Telephone Encounter (Signed)
Prednisone for a few days should be fine in terms of his blood sugar. He will need to get an rx from the orthopedist for this. I cannot send in a prescription without evaluating him first.  Algis Greenhouse. [name], MD 05/27/2022 11:02 AM

## 2022-05-27 NOTE — Telephone Encounter (Signed)
See note

## 2022-05-29 ENCOUNTER — Other Ambulatory Visit: Payer: Self-pay | Admitting: Family Medicine

## 2022-05-30 ENCOUNTER — Encounter: Payer: Self-pay | Admitting: Family Medicine

## 2022-05-30 ENCOUNTER — Ambulatory Visit (INDEPENDENT_AMBULATORY_CARE_PROVIDER_SITE_OTHER): Payer: Medicare Other | Admitting: Family Medicine

## 2022-05-30 VITALS — BP 149/79 | HR 64 | Temp 97.7°F | Ht 68.0 in | Wt 187.0 lb

## 2022-05-30 DIAGNOSIS — I152 Hypertension secondary to endocrine disorders: Secondary | ICD-10-CM

## 2022-05-30 DIAGNOSIS — E1159 Type 2 diabetes mellitus with other circulatory complications: Secondary | ICD-10-CM | POA: Diagnosis not present

## 2022-05-30 DIAGNOSIS — E1165 Type 2 diabetes mellitus with hyperglycemia: Secondary | ICD-10-CM

## 2022-05-30 MED ORDER — PREDNISONE 50 MG PO TABS: ORAL_TABLET | ORAL | 0 refills | Status: DC

## 2022-05-30 NOTE — Progress Notes (Signed)
   [name] [name] is a 71 y.o. male who presents today for an office visit.  Assessment/Plan:  New/Acute Problems: Left Arm Pain Has been following with orthopedics for this.  History is consistent with cervical radiculopathy.  We will start prednisone today per recommendation from his orthopedist.  He also has been referred to see physical therapy but has not yet started this.  He will follow-up with orthopedic soon.  If he needs a referral from our office we will be happy to provide this for him.  We discussed reasons return to care.  Follow-up as needed.  Chronic Problems Addressed Today: T2DM (type 2 diabetes mellitus) ([place]) Last A1c 7.3.  Has typically been well controlled on metformin 1000 mg twice daily.  Do not anticipate he will have much issue with prednisone.  He will come back soon to recheck A1c.  Hypertension associated with diabetes ([place]) Slightly elevated today compared to previous readings though still at goal per JNC 8.  We will continue current regimen Coreg 3.125 mg twice daily, irbesartan 300 mg daily, and spironolactone 25 mg daily.  He will follow-up with cardiology as needed.     Subjective:  HPI:  Patient here with left arm pain for the last several days. He called orthopedics and saw a PA. Had an xray which showed degenerative changes. They recommend he start on prednisone but told him to follow up here to have this prescribed. His pain has persisted. Has a throbbing sensation.  Worse with certain motions.  Occasionally has numbness throughout his arm.       Objective:  Physical Exam: BP (!) 149/79   Pulse 64   Temp 97.7 F (36.5 C) (Temporal)   Ht '5\' 8"'$  (1.727 m)   Wt 187 lb (84.8 kg)   SpO2 97%   BMI 28.43 kg/m   Gen: No acute distress, resting comfortably CV: Regular rate and rhythm with no murmurs appreciated Pulm: Normal work of breathing, clear to auscultation bilaterally with no crackles, wheezes, or rhonchi MSK - Neck: No deformities.   Decreased range of motion.  Spurling negative - Left Arm: Strength out of 5 throughout.  Sensation light touch intact throughout. Neuro: Grossly normal, moves all extremities Psych: Normal affect and thought content      Delle Andrzejewski M. [name], MD 05/30/2022 10:32 AM

## 2022-05-30 NOTE — Assessment & Plan Note (Signed)
Last A1c 7.3.  Has typically been well controlled on metformin 1000 mg twice daily.  Do not anticipate he will have much issue with prednisone.  He will come back soon to recheck A1c.

## 2022-05-30 NOTE — Assessment & Plan Note (Signed)
Slightly elevated today compared to previous readings though still at goal per JNC 8.  We will continue current regimen Coreg 3.125 mg twice daily, irbesartan 300 mg daily, and spironolactone 25 mg daily.  He will follow-up with cardiology as needed.

## 2022-05-30 NOTE — Telephone Encounter (Signed)
Talked with patient and he received prednisone from his PCP.

## 2022-05-30 NOTE — Progress Notes (Unsigned)
[name] [name] Date of Birth: 11-07-1950 Medical Record #[id number]  History of Present Illness: [place] is seen for cardiac followup CAD. He has noted left arm pain and elevated BP.  He has a history of coronary disease diagnosed by cardiac CTA in 2007. In September 2015 he presented with chest pain. Myoview study demonstrated anterior wall ischemia. Subsequent cardiac cath demonstrated severe disease in the mid LAD and first diagonal with 50-70% disease in the OM2. On 04/01/14 he had successful stenting of the LAD and diagonal with DES. FFR of the OM was normal. Despite aggressive pretreatment for dye allergy with steroids, pepcid, and benadryl he still developed hives and diarrhea post procedure.   He was seen last year for pre op clearance for knee surgery. Myoview study was done and was low risk. Some anterior wall scar without ischemia. EF 49%.   He did have right TKR on 05/11/21. He was admitted in Dec 2022 with weakness and melena. Plavix was held, patient underwent endoscopy on 12/10 which showed clean-based ulcer.  It was recommended to hold Plavix until seen by outpatient GI during follow-up in the meantime continue PPI twice daily.  Hemoglobin remained stable  after transfusion.   He underwent repeat EGD in March 2023 showing a bleeding angiodysplastic lesion that was treated. He had a clean based duodenal ulcer and some duodenitis. He also had colonoscopy with removal of one large 60 mm nodule and 2 small sessile polyps. Planning for repeat endoscopy in one month.  He denies any cardiac issues. No chest pain or dyspnea. Energy level is much better since Hgb improved. No real complaints today.  Current Outpatient Medications on File Prior to Visit  Medication Sig Dispense Refill   Accu-Chek Softclix Lancets lancets 1 each by Other route 3 (three) times daily. 102 each 11   atorvastatin (LIPITOR) 80 MG tablet TAKE 1 TABLET DAILY AT     6:00PM 90 tablet 0   Blood Glucose Monitoring Suppl  (ACCU-CHEK AVIVA PLUS) w/Device KIT Check glucose TID or as needed E11.9 1 kit 0   carvedilol (COREG) 3.125 MG tablet TAKE 1 TABLET TWICE DAILY  WITH MEALS 180 tablet 0   clopidogrel (PLAVIX) 75 MG tablet TAKE 1 TABLET DAILY 90 tablet 1   Coenzyme Q10 200 MG capsule Take 200 mg by mouth daily. gummie     famotidine-calcium carbonate-magnesium hydroxide (PEPCID COMPLETE) 10-800-165 MG chewable tablet Chew 0.5 tablets by mouth every 6 (six) hours as needed (heartburn).     ferrous sulfate 325 (65 FE) MG tablet Take 325 mg by mouth daily.     glucose blood test strip Use as instructed 100 each 12   irbesartan (AVAPRO) 300 MG tablet TAKE 1 TABLET DAILY 90 tablet 0   metFORMIN (GLUCOPHAGE-XR) 500 MG 24 hr tablet TAKE 2 TABLETS DAILY AFTER SUPPER 180 tablet 3   methocarbamol (ROBAXIN) 500 MG tablet Take 1 tablet (500 mg total) by mouth every 8 (eight) hours as needed for muscle spasms. 50 tablet 0   Multiple Vitamins-Minerals (MULTIVITAMIN WITH MINERALS) tablet Take 1 tablet by mouth daily.     nitroGLYCERIN (NITROSTAT) 0.4 MG SL tablet Place 1 tablet (0.4 mg total) under the tongue every 5 (five) minutes as needed for chest pain. 100 tablet 1   pantoprazole (PROTONIX) 40 MG tablet TAKE 1 TABLET TWICE DAILY  BEFORE MEALS (Patient taking differently: Take 40 mg by mouth daily.) 180 tablet 1   predniSONE (DELTASONE) 50 MG tablet Take 1 tablet daily for 5  days. Then take 1/2 tablet daily for 2 days. 6 tablet 0   sildenafil (REVATIO) 20 MG tablet Take 1-5 tablets by mouth daily as needed. 100 tablet 0   sodium chloride (OCEAN) 0.65 % SOLN nasal spray Place 1 spray into both nostrils as needed for congestion.     spironolactone (ALDACTONE) 25 MG tablet TAKE 1 TABLET DAILY 90 tablet 0   Undecylenic Ac-Zn Undecylenate (FUNGI-NAIL TOE & FOOT EX) Apply 1 application topically See admin instructions. Apply to bilateral big toes every other day     vitamin B-12 (CYANOCOBALAMIN) 1000 MCG tablet Take 1,000 mcg by  mouth daily.     No current facility-administered medications on file prior to visit.    Allergies  Allergen Reactions   Aspirin Hives, Swelling and Other (See Comments)    Angioedema   Oxycodone Nausea Only   Contrast Media [Iodinated Contrast Media] Hives, Itching, Rash and Other (See Comments)    Developed hives despite pre-treatment   Norvasc [Amlodipine Besylate] Other (See Comments) and Cough    09/12/2013 gingival hyperplasia , Dr Geralynn Ochs ,DDS    Past Medical History:  Diagnosis Date   Adenomatous colon polyp 09/07/2004   Amblyopia    CAD (coronary artery disease)    cardiac CT with a 50% LAD lesion in 2007   Diverticulosis    DM2 (diabetes mellitus, type 2) ([facility])    Elevated LFTs    secondary to labetalol   Esophageal stricture 2003   External hemorrhoids 2012   lanced by Dr Rise Patience   Family history of anesthesia complication    "sister had PONV"   GERD (gastroesophageal reflux disease)    esophageal stricture, pmh   Hiatal hernia 01/04/2000   History of kidney stones 2022   HLD (hyperlipidemia)    HTN (hypertension)    Hypopotassemia     Past Surgical History:  Procedure Laterality Date   BIOPSY  06/05/2021   Procedure: BIOPSY;  Surgeon: Gatha Mayer, MD;  Location: [facility];  Service: Endoscopy;;   BIOPSY  09/02/2021   Procedure: BIOPSY;  Surgeon: [name]., MD;  Location: [place];  Service: Gastroenterology;;   COLONOSCOPY  06/27/2004   tics , ADENOMATOUS polyps. F/U due 2011   COLONOSCOPY  06/27/2010   Tics   COLONOSCOPY WITH PROPOFOL N/A 09/02/2021   Procedure: COLONOSCOPY WITH PROPOFOL;  Surgeon: Rush Landmark Telford Nab., MD;  Location: [place];  Service: Gastroenterology;  Laterality: N/A;   COLONOSCOPY WITH PROPOFOL N/A 04/14/2022   Procedure: COLONOSCOPY WITH PROPOFOL;  Surgeon: Rush Landmark Telford Nab., MD;  Location: [facility];  Service: Gastroenterology;  Laterality: N/A;   CORONARY ANGIOPLASTY WITH STENT PLACEMENT   04/01/2014   "2"   ENDOSCOPIC MUCOSAL RESECTION  09/02/2021   Procedure: ENDOSCOPIC MUCOSAL RESECTION;  Surgeon: Rush Landmark Telford Nab., MD;  Location: [place];  Service: Gastroenterology;;   ENDOSCOPIC MUCOSAL RESECTION N/A 04/14/2022   Procedure: ENDOSCOPIC MUCOSAL RESECTION;  Surgeon: [name]., MD;  Location: [facility];  Service: Gastroenterology;  Laterality: N/A;   ESOPHAGEAL BRUSHING  04/14/2022   Procedure: ESOPHAGEAL BRUSHING;  Surgeon: [name]., MD;  Location: [facility];  Service: Gastroenterology;;   ESOPHAGEAL DILATION  06/27/2001   ESOPHAGOGASTRODUODENOSCOPY  06/27/2001   ERD, stricture dilated   ESOPHAGOGASTRODUODENOSCOPY (EGD) WITH PROPOFOL N/A 06/05/2021   Procedure: ESOPHAGOGASTRODUODENOSCOPY (EGD) WITH PROPOFOL;  Surgeon: Gatha Mayer, MD;  Location: [facility];  Service: Endoscopy;  Laterality: N/A;   ESOPHAGOGASTRODUODENOSCOPY (EGD) WITH PROPOFOL N/A 09/02/2021   Procedure: ESOPHAGOGASTRODUODENOSCOPY (EGD)  WITH PROPOFOL;  Surgeon: Mansouraty, Telford Nab., MD;  Location: [place];  Service: Gastroenterology;  Laterality: N/A;   ESOPHAGOGASTRODUODENOSCOPY (EGD) WITH PROPOFOL N/A 04/14/2022   Procedure: ESOPHAGOGASTRODUODENOSCOPY (EGD) WITH PROPOFOL;  Surgeon: Rush Landmark Telford Nab., MD;  Location: [facility];  Service: Gastroenterology;  Laterality: N/A;   EXCISIONAL HEMORRHOIDECTOMY  ~ 2008   HEMOSTASIS CLIP PLACEMENT  09/02/2021   Procedure: HEMOSTASIS CLIP PLACEMENT;  Surgeon: Rush Landmark Telford Nab., MD;  Location: [place];  Service: Gastroenterology;;   HEMOSTASIS CONTROL  04/14/2022   Procedure: HEMOSTASIS CONTROL;  Surgeon: [name]., MD;  Location: [facility];  Service: Gastroenterology;;   HOT HEMOSTASIS N/A 09/02/2021   Procedure: HOT HEMOSTASIS (ARGON PLASMA COAGULATION/BICAP);  Surgeon: [name]., MD;  Location: [place];  Service: Gastroenterology;  Laterality: N/A;   HOT  HEMOSTASIS N/A 04/14/2022   Procedure: HOT HEMOSTASIS (ARGON PLASMA COAGULATION/BICAP);  Surgeon: [name]., MD;  Location: [facility];  Service: Gastroenterology;  Laterality: N/A;   LEFT HEART CATHETERIZATION WITH CORONARY ANGIOGRAM N/A 03/26/2014   Procedure: LEFT HEART CATHETERIZATION WITH CORONARY ANGIOGRAM;  Surgeon: Haeden Hudock M Martinique, MD;  Location: [facility];  Service: Cardiovascular;  Laterality: N/A;   PERCUTANEOUS CORONARY STENT INTERVENTION (PCI-S) N/A 04/01/2014   Procedure: PERCUTANEOUS CORONARY STENT INTERVENTION (PCI-S);  Surgeon: Obryan Radu M Martinique, MD;  Location[facility];  Service: Cardiovascular;  Laterality: N/A;   POSTERIOR LAMINECTOMY / DECOMPRESSION LUMBAR SPINE  12/25/2009   L4 , Dr Sherwood Gambler   SHOULDER ARTHROSCOPY WITH ROTATOR CUFF REPAIR AND OPEN BICEPS TENODESIS Left 02/04/2019   Procedure: LEFT SHOULDER ARTHROSCOPY, BICEPS TENODESIS, MINI OPEN ROTATOR CUFF TEAR REPAIR;  Surgeon: Meredith Pel, MD;  Location: [place];  Service: Orthopedics;  Laterality: Left;   SUBMUCOSAL INJECTION  09/02/2021   Procedure: SUBMUCOSAL INJECTION;  Surgeon: Rush Landmark Telford Nab., MD;  Location: [place];  Service: Gastroenterology;;   SUBMUCOSAL LIFTING INJECTION  04/14/2022   Procedure: SUBMUCOSAL LIFTING INJECTION;  Surgeon: [name]., MD;  Location: [facility];  Service: Gastroenterology;;   TOTAL KNEE ARTHROPLASTY Right 05/11/2021   Procedure: TOTAL KNEE ARTHROPLASTY;  Surgeon: Paralee Cancel, MD;  Location: [facility];  Service: Orthopedics;  Laterality: Right;    Social History   Tobacco Use  Smoking Status Never  Smokeless Tobacco Never    Social History   Substance and Sexual Activity  Alcohol Use Yes   Alcohol/week: 2.0 standard drinks of alcohol   Types: 2 Cans of beer per week   Comment: social    Family History  Problem Relation Age of Onset   Heart attack Mother 89   Stroke Mother 24   Prostate cancer  Father    Diabetes Father    Heart disease Father 80       CBAG    Heart disease Sister    Diabetes Brother    Heart attack Brother    Breast cancer Maternal Aunt    Heart attack Maternal Grandfather 61   Diabetes Paternal Grandmother    Stroke Paternal Grandfather 57   Stomach cancer Neg Hx    Rectal cancer Neg Hx    Esophageal cancer Neg Hx    Colon cancer Neg Hx    Inflammatory bowel disease Neg Hx    Liver disease Neg Hx    Pancreatic cancer Neg Hx     Review of Systems: As noted in HPI. All other systems were reviewed and are negative.  Physical Exam: There were no vitals taken for this visit.  GENERAL:  Well appearing WM in NAD HEENT:  PERRL, EOMI, sclera are clear. Oropharynx is clear. NECK:  No jugular venous distention, carotid upstroke brisk and symmetric, no bruits, no thyromegaly or adenopathy LUNGS:  Clear to auscultation bilaterally CHEST:  Unremarkable HEART:  RRR,  PMI not displaced or sustained,S1 and S2 within normal limits, no S3, no S4: no clicks, no rubs, no murmurs ABD:  Soft, nontender. BS +, no masses or bruits. No hepatomegaly, no splenomegaly EXT:  2 + pulses throughout, no edema, no cyanosis no clubbing SKIN:  Warm and dry.  No rashes NEURO:  Alert and oriented x 3. Cranial nerves II through XII intact. PSYCH:  Cognitively intact      LABORATORY DATA: Lab Results  Component Value Date   WBC 7.3 03/15/2022   HGB 14.1 03/15/2022   HCT 41.9 03/15/2022   PLT 208.0 03/15/2022   GLUCOSE 152 (H) 03/15/2022   CHOL 110 09/02/2020   TRIG 100.0 09/02/2020   HDL 35.80 (L) 09/02/2020   LDLCALC 54 09/02/2020   ALT 20 03/15/2022   AST 15 03/15/2022   NA 139 03/15/2022   K 4.5 03/15/2022   CL 105 03/15/2022   CREATININE 0.94 03/15/2022   BUN 19 03/15/2022   CO2 27 03/15/2022   TSH 1.04 03/15/2022   PSA 0.9 03/05/2020   INR 0.9 08/27/2021   HGBA1C 7.3 (A) 03/15/2022   MICROALBUR 0.9 03/15/2022   Ecg today shows NSR with normal Ecg. Rate 60.  I have personally reviewed and interpreted this study.  Myoview 09/23/20: Study Highlights    The left ventricular ejection fraction is mildly decreased (45-54%). Nuclear stress EF: 49%. Mid anterior wall hypokinesis. There was no ST segment deviation noted during stress. Defect 1: There is a medium defect of severe severity present in the mid anterior location. Findings consistent with prior myocardial infarction. This is an intermediate risk study. Old infarct pattern noted in the mid inferior wall distribution. Prior LAD stent placed. No ischemia identified.   Candee Furbish, MD   Assessment / Plan: 1. Coronary disease s/p DES stenting of the LAD and first diagonal in October 2015. Normal FFR of OM2. He is asymptomatic. Myoview in March 2022 was normal.   Will continue Plavix long term since he is ASA allergic.  OK to interrupt for endoscopy procedures. Continue risk factor modification.     2. Hyperlipidemia.  On high dose lipitor. Lipids at goal with LDL 54 in March 2022. Due for repeat with PCP.   3. Diabetes mellitus type 2. On metformin. Followed by primary care.  A1c 6.8 %.  He is really going to work on his diet and weight control.  4. Hypertension  5. History of Contrast dye allergy with hives- despite pretreatment.

## 2022-05-30 NOTE — Patient Instructions (Signed)
It was very nice to see you today!  Please start the prednisone.  We will see you back soon to recheck your A1c.  Please let us know if you need a referral to a physical therapist.  Please keep an eye on your blood pressure and let us know if it is persistently elevated.  Take care, Dr [name]  PLEASE NOTE:  If you had any lab tests please let us know if you have not heard back within a few days. You may see your results on mychart before we have a chance to review them but we will give you a call once they are reviewed by [place]. If we ordered any referrals today, please let us know if you have not heard from their office within the next week.   Please try these tips to maintain a healthy lifestyle:  Eat at least 3 REAL meals and 1-2 snacks per day.  Aim for no more than 5 hours between eating.  If you eat breakfast, please do so within one hour of getting up.   Each meal should contain half fruits/vegetables, one quarter protein, and one quarter carbs (no bigger than a computer mouse)  Cut down on sweet beverages. This includes juice, soda, and sweet tea.   Drink at least 1 glass of water with each meal and aim for at least 8 glasses per day  Exercise at least 150 minutes every week.

## 2022-05-31 NOTE — Progress Notes (Deleted)
Chronic Care Management Pharmacy Note  05/31/2022 Name:  [name] [name] MRN:  [id number] DOB:  [birth year]  Recommendations/Changes made from today's visit: He agrees to start monitoring and recording BP at home.  Last few office BP have been elevated.   Will have CMA check on home readings  in 30 days.  Patient to call sooner if consistently > 140/90  Subjective: [name] [name] is an 71 y.o. year old male who is a primary patient of [name], Algis Greenhouse, MD.  The CCM team was consulted for assistance with disease management and care coordination needs.    Engaged with patient by telephone for follow up visit in response to provider referral for pharmacy case management and/or care coordination services.   Consent to Services:  The patient was given information about Chronic Care Management services, agreed to services, and gave verbal consent prior to initiation of services.  Please see initial visit note for detailed documentation.   Patient Care Team: Vivi Barrack, MD as PCP - General (Family Medicine) Martinique, Peter M, MD as PCP - Cardiology (Cardiology) Calton Dach, MD as Consulting Physician (Optometry) Marlou Sa, Tonna Corner, MD as Consulting Physician (Orthopedic Surgery) [facility] as [place], [facility] (Pharmacist)  Hospital visits: None in previous 6 months  Objective:  Lab Results  Component Value Date   CREATININE 0.94 03/15/2022   CREATININE 0.91 08/27/2021   CREATININE 1.04 06/04/2021    Lab Results  Component Value Date   HGBA1C 7.3 (A) 03/15/2022   Last diabetic Eye exam:  Lab Results  Component Value Date/Time   HMDIABEYEEXA No Retinopathy 05/06/2022 12:00 AM    Last diabetic Foot exam: No results found for: "HMDIABFOOTEX"      Component Value Date/Time   CHOL 110 09/02/2020 1010   CHOL 132 08/18/2016 0814   TRIG 100.0 09/02/2020 1010   TRIG 82 04/11/2006 0804   HDL 35.80 (L) 09/02/2020 1010    HDL 41 08/18/2016 0814   CHOLHDL 3 09/02/2020 1010   VLDL 20.0 09/02/2020 1010   LDLCALC 54 09/02/2020 1010   LDLCALC 68 03/05/2020 1009       Latest Ref Rng & Units 03/15/2022   10:31 AM 06/04/2021   12:31 PM 04/30/2021   11:02 AM  Hepatic Function  Total Protein 6.0 - 8.3 g/dL 6.8  6.3  6.8   Albumin 3.5 - 5.2 g/dL 3.9  3.5  4.1   AST 0 - 37 U/L _0 ALT 0 - 53 U/L _1 Alk Phosphatase 39 - 117 U/L 82  84  86   Total Bilirubin 0.2 - 1.2 mg/dL 0.6  0.7  0.9     Lab Results  Component Value Date/Time   TSH 1.04 03/15/2022 10:31 AM   TSH 0.96 09/02/2020 10:10 AM       Latest Ref Rng & Units 03/15/2022   10:31 AM 12/14/2021    9:53 AM 11/10/2021   10:15 AM  CBC  WBC 4.0 - 10.5 K/uL 7.3  6.7  7.2   Hemoglobin 13.0 - 17.0 g/dL 14.1  14.4  12.9   Hematocrit 39.0 - 52.0 % 41.9  43.2  38.8   Platelets 150.0 - 400.0 K/uL 208.0  203.0  217.0     No results found for: "VD25OH"  Clinical ASCVD:  The ASCVD Risk score (Arnett DK, et al., 2019) failed to calculate for the following reasons:  The valid total cholesterol range is 130 to 320 mg/dL    Other: (CHADS2VASc if Afib, PHQ9 if depression, MMRC or CAT for COPD, ACT, DEXA)  Social History   Tobacco Use  Smoking Status Never  Smokeless Tobacco Never   BP Readings from Last 3 Encounters:  05/30/22 (!) 149/79  05/26/22 (!) 163/102  04/14/22 (!) 189/76   Pulse Readings from Last 3 Encounters:  05/30/22 64  05/26/22 82  04/14/22 (!) 44   Wt Readings from Last 3 Encounters:  05/30/22 187 lb (84.8 kg)  05/26/22 180 lb (81.6 kg)  04/14/22 180 lb (81.6 kg)    Assessment: Review of patient past medical history, allergies, medications, health status, including review of consultants reports, laboratory and other test data, was performed as part of comprehensive evaluation and provision of chronic care management services.   SDOH:  (Social Determinants of Health) assessments and interventions performed:   SDOH Interventions    Flowsheet Row Clinical Support from 03/24/2022 in [place] from 03/18/2021 in [place] Management from 05/05/2020 in [place] Interventions Intervention Not Indicated -- Other (Comment)  Housing Interventions Intervention Not Indicated Intervention Not Indicated --  Transportation Interventions Intervention Not Indicated Intervention Not Indicated Other (Comment)  Financial Strain Interventions Intervention Not Indicated Intervention Not Indicated --  Physical Activity Interventions Intervention Not Indicated Intervention Not Indicated --  Stress Interventions Intervention Not Indicated -- --  Social Connections Interventions Intervention Not Indicated Intervention Not Indicated --       CCM Care Plan  Allergies  Allergen Reactions   Aspirin Hives, Swelling and Other (See Comments)    Angioedema   Oxycodone Nausea Only   Contrast Media [Iodinated Contrast Media] Hives, Itching, Rash and Other (See Comments)    Developed hives despite pre-treatment   Norvasc [Amlodipine Besylate] Other (See Comments) and Cough    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 Sig Documenting Provider Last Dose Status Informant  Accu-Chek Softclix Lancets lancets [id number]  1 each by Other route 3 (three) times daily. Vivi Barrack, MD  Active Self  atorvastatin (LIPITOR) 80 MG tablet [id number]  TAKE 1 TABLET DAILY AT     6:00PM Vivi Barrack, MD  Active   Blood Glucose Monitoring Suppl (ACCU-CHEK AVIVA PLUS) w/Device KIT [id number]  Check glucose TID or as needed E11.9 Vivi Barrack, MD  Active Self  carvedilol (COREG) 3.125 MG tablet [id number]  TAKE 1  TABLET TWICE DAILY  WITH MEALS Vivi Barrack, MD  Active   clopidogrel (PLAVIX) 75 MG tablet [id number]  TAKE 1 TABLET DAILY Vivi Barrack, MD  Active   Coenzyme Q10 200 MG capsule [id number]  Take 200 mg by mouth daily. gummie [provider]  Active Self  famotidine-calcium carbonate-magnesium hydroxide (PEPCID COMPLETE) 10-800-165 MG chewable tablet [id number]  Chew 0.5 tablets by mouth every 6 (six) hours as needed (heartburn). [provider]  Active Self  ferrous sulfate 325 (65 FE) MG tablet [id number]  Take 325 mg by mouth daily. [provider]  Active Self  glucose blood test strip [id number]  Use as instructed Vivi Barrack, MD  Active Self  irbesartan (AVAPRO) 300 MG tablet [id number]  TAKE  1 TABLET DAILY Vivi Barrack, MD  Active   metFORMIN (GLUCOPHAGE-XR) 500 MG 24 hr tablet [id number]  TAKE 2 TABLETS DAILY AFTER SUPPER Vivi Barrack, MD  Active Self  methocarbamol (ROBAXIN) 500 MG tablet [id number]  Take 1 tablet (500 mg total) by mouth every 8 (eight) hours as needed for muscle spasms. Lanae Crumbly, PA-C  Active   Multiple Vitamins-Minerals (MULTIVITAMIN WITH MINERALS) tablet [id number]  Take 1 tablet by mouth daily. [provider]  Active Self  nitroGLYCERIN (NITROSTAT) 0.4 MG SL tablet [id number]  Place 1 tablet (0.4 mg total) under the tongue every 5 (five) minutes as needed for chest pain. Martinique, Peter M, MD  Active Self           Med Note Wilmon Pali, MELISSA R   Thu Aug 26, 2021  2:44 PM)    pantoprazole (PROTONIX) 40 MG tablet [id number]  TAKE 1 TABLET TWICE DAILY  BEFORE MEALS  Patient taking differently: Take 40 mg by mouth daily.   Zehr, Laban Emperor, PA-C  Active Self  sildenafil (REVATIO) 20 MG tablet [id number]  Take 1-5 tablets by mouth daily as needed. Vivi Barrack, MD  Active Self  sodium chloride (OCEAN) 0.65 % SOLN nasal spray [id number]  Place 1 spray into both nostrils as needed for congestion. [provider]  Active  Self  spironolactone (ALDACTONE) 25 MG tablet [id number]  TAKE 1 TABLET DAILY Vivi Barrack, MD  Active   Undecylenic Ac-Zn Undecylenate (FUNGI-NAIL TOE & FOOT EX) [id number]  Apply 1 application topically See admin instructions. Apply to bilateral big toes every other day [provider]  Active Self  vitamin B-12 (CYANOCOBALAMIN) 1000 MCG tablet [id number]  Take 1,000 mcg by mouth daily. [provider]  Active Self            Patient Active Problem List   Diagnosis Date Noted   IDA (iron deficiency anemia) 08/31/2021   Abnormal colonoscopy 08/29/2021   Tubular adenoma of ascending colon 08/29/2021   Hx of adenomatous colonic polyps 08/29/2021   PUD (peptic ulcer disease) 08/10/2021   S/P total knee arthroplasty, 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   Nephrolithiasis 03/05/2020   Labral tear of long head of biceps tendon, left, sequela    Complete tear of left rotator cuff    Antiplatelet or antithrombotic long-term use 01/17/2017   Diverticulosis of colon (without mention of hemorrhage) 04/25/2011   Adenomatous colon polyp 03/17/2011   LOW BACK PAIN, CHRONIC 12/13/2007   Hyperlipidemia associated with type 2 diabetes mellitus ([place]) 06/08/2007   T2DM (type 2 diabetes mellitus) ([place]) 02/27/2007   Hypertension associated with diabetes ([place]) 02/27/2007   AMBLYOPIA 12/06/2006   GERD 12/06/2006    Immunization History  Administered Date(s) Administered   Fluad Quad(high Dose 65+) 02/21/2019, 04/07/2020, 03/08/2021, 03/15/2022   Influenza,inj,Quad PF,6+ Mos 03/19/2014   PFIZER(Purple Top)SARS-COV-2 Vaccination 08/01/2019, 08/27/2019, 03/25/2020, 03/12/2021   Pneumococcal Conjugate-13 01/17/2017   Pneumococcal Polysaccharide-23 01/31/2018   Td 12/18/2008   Tdap 04/07/2020   Zoster, Live 12/24/2012    Conditions to be addressed/monitored: HLD HTN DMII OA   There are no care plans that you recently modified to display for this patient.    Pharmacist Clinical Goal(s):   Patient will contact provider office for questions/concerns as evidenced notation of same in electronic health record through collaboration with PharmD and provider.   Patient Goals/Self-Care Activities Patient will:  - take medications as prescribed target a minimum of 150 minutes of moderate intensity exercise weekly  Follow  Up Plan:  [place] f/u call 6 months Medication Assistance: None required.  Patient affirms current coverage meets needs.  Patient's preferred pharmacy is:  [facility] [id number], [place] [address] Phone: [phone] Fax: [phone]  [facility] [place] [facility] [address] [place] [address] Phone: [phone] Fax: [phone]   Follow Up:  Patient agrees to Care Plan and Follow-up.  Future Appointments  Date Time Provider [place]  06/01/2022  9:30 AM Martinique, Peter M, MD [facility] None  06/06/2022  3:30 PM [facility] CCM PHARMACIST [facility]  06/16/2022 10:30 AM Callie Fielding, MD [facility] None  09/14/2022  9:00 AM Vivi Barrack, MD [facility]  04/06/2023  9:30 AM [facility] HEALTH COACH [facility] [place], PharmD Clinical Pharmacist  [place] [phone]     Current Barriers:  Elevated BP  Interventions: 1:1 collaboration with Vivi Barrack, MD regarding development and update of comprehensive plan of care as evidenced by provider attestation and co-signature Inter-disciplinary care team collaboration (see longitudinal plan of care) Comprehensive medication review performed; medication list updated in electronic medical record No Rx changes  Hypertension (BP goal <140/90) -Controlled -Current treatment: Irbesartan 300 mg once daily Appropriate, Query effective, ,  Carvedilol 3.125 mg twice daily Appropriate, Query  effective, Spironolactone 25 mg once daily Appropriate, Query effective, -Current home readings: at goal -Current dietary habits: some processed foods and carbs - rice, bagels -Current exercise habits: limited due to knee pain - is strongly considering knee replacement -Denies hypotensive/hypertensive symptoms -Educated on Symptoms of hypotension and importance of maintaining adequate hydration; -Counseled to monitor BP at home 1-2x/month, document, and provide log at future appointments -Counseled on diet and exercise extensively Recommended to continue current medication  Update 03/30/21 155/80 this morning, patient was dealing with stressor of switching cellular phones.  Normally controlled at home per pt and has been controlled previously in office.  No changes to medications.  Reports 100% adherence.  Recommend he check BP at least once or twice weekly and reports consistently elevated BP's to myself if < 140/90.  Update 12/06/21 BP was elevated last two in office readings. He has not been checking at home. We discussed the need to monitor at home and record. He agrees to do so at least once or twice weekly.   Call me if consistently > 140/90. Will have CMA FU in 30 days to assess home readings.  Hyperlipidemia: (LDL goal < 70) -Controlled -Current treatment: Atorvastatin 80 mg once daily -Educated on Cholesterol goals;  -Recommended to continue current medication  Diabetes (A1c goal <7%) -Not ideally controlled -Current medications: Metformin 500 mg XR - two tablets once daily -Medications previously tried: IR (did not tolerate) -Current home glucose readings fasting glucose: not testing post prandial glucose: not testing -Educated on A1c and blood sugar goals; -Reviewed side effects - tolerating XR preparation much better, now is able to take consistently due to not needing to bring meds to work with him -Counseled to check feet daily and get yearly eye exams -Counseled  on diet and exercise extensively Recommended to continue current medication  Update 03/30/21 Continues to tolerate XR metformin.  Takes all at one time which has improved adherence.  A1c has decreased to 7.0.  He is now more conscious of what he is eating.  Trying to substitute protein  shakes with fruit as snacks to fill him up and limit cravings.  Continue current meds.  Update 12/06/21 Continues to take metformin and tolerating. A1c continues to improve. Most recent A1c was 6.8 No changes needed at this time continue routine screenings.

## 2022-06-01 ENCOUNTER — Ambulatory Visit: Payer: Medicare Other | Attending: Cardiology | Admitting: Cardiology

## 2022-06-01 ENCOUNTER — Encounter: Payer: Self-pay | Admitting: Cardiology

## 2022-06-01 VITALS — BP 144/70 | HR 53 | Ht 68.0 in | Wt 188.4 lb

## 2022-06-01 DIAGNOSIS — E7800 Pure hypercholesterolemia, unspecified: Secondary | ICD-10-CM | POA: Insufficient documentation

## 2022-06-01 DIAGNOSIS — I10 Essential (primary) hypertension: Secondary | ICD-10-CM | POA: Diagnosis not present

## 2022-06-01 DIAGNOSIS — I25118 Atherosclerotic heart disease of native coronary artery with other forms of angina pectoris: Secondary | ICD-10-CM

## 2022-06-01 MED ORDER — HYDROCHLOROTHIAZIDE 25 MG PO TABS: 25.0000 mg | ORAL_TABLET | Freq: Every day | ORAL | 3 refills | Status: DC

## 2022-06-01 NOTE — Patient Instructions (Signed)
Add HCTZ 25 mg daily for BP control  Have Dr [name] check your lipids with next blood draw.

## 2022-06-06 ENCOUNTER — Telehealth: Payer: Medicare Other

## 2022-06-06 ENCOUNTER — Ambulatory Visit: Payer: Medicare Other | Attending: Surgery

## 2022-06-06 ENCOUNTER — Encounter: Payer: Self-pay | Admitting: Orthopedic Surgery

## 2022-06-06 ENCOUNTER — Other Ambulatory Visit: Payer: Self-pay

## 2022-06-06 DIAGNOSIS — M5412 Radiculopathy, cervical region: Secondary | ICD-10-CM | POA: Diagnosis not present

## 2022-06-06 DIAGNOSIS — M542 Cervicalgia: Secondary | ICD-10-CM | POA: Insufficient documentation

## 2022-06-06 DIAGNOSIS — M4722 Other spondylosis with radiculopathy, cervical region: Secondary | ICD-10-CM | POA: Diagnosis not present

## 2022-06-06 NOTE — Therapy (Signed)
OUTPATIENT PHYSICAL THERAPY CERVICAL EVALUATION   Patient Name: [name] [name] MRN: [id number] DOB:03-25-1951, 71 y.o., male Today's Date: 06/06/2022  END OF SESSION:  PT End of Session - 06/06/22 1228     Visit Number 1    Number of Visits 8    Date for PT Re-Evaluation 08/01/22    Authorization Type MCR    PT Start Time 1215    PT Stop Time 1300    PT Time Calculation (min) 45 min    Behavior During Therapy [name] for tasks assessed/performed             Past Medical History:  Diagnosis Date   Adenomatous colon polyp 09/07/2004   Amblyopia    CAD (coronary artery disease)    cardiac CT with a 50% LAD lesion in 2007   Diverticulosis    DM2 (diabetes mellitus, type 2) (HCC)    Elevated LFTs    secondary to labetalol   Esophageal stricture 2003   External hemorrhoids 2012   lanced by Dr Rise Patience   Family history of anesthesia complication    "sister had PONV"   GERD (gastroesophageal reflux disease)    esophageal stricture, pmh   Hiatal hernia 01/04/2000   History of kidney stones 2022   HLD (hyperlipidemia)    HTN (hypertension)    Hypopotassemia    Past Surgical History:  Procedure Laterality Date   BIOPSY  06/05/2021   Procedure: BIOPSY;  Surgeon: Gatha Mayer, MD;  Location: [facility];  Service: Endoscopy;;   BIOPSY  09/02/2021   Procedure: BIOPSY;  Surgeon: [name]., MD;  Location: [facility] ENDOSCOPY;  Service: Gastroenterology;;   COLONOSCOPY  06/27/2004   tics , ADENOMATOUS polyps. F/U due 2011   COLONOSCOPY  06/27/2010   Tics   COLONOSCOPY WITH PROPOFOL N/A 09/02/2021   Procedure: COLONOSCOPY WITH PROPOFOL;  Surgeon: Rush Landmark Telford Nab., MD;  Location: [place];  Service: Gastroenterology;  Laterality: N/A;   COLONOSCOPY WITH PROPOFOL N/A 04/14/2022   Procedure: COLONOSCOPY WITH PROPOFOL;  Surgeon: Rush Landmark Telford Nab., MD;  Location: [facility];  Service: Gastroenterology;  Laterality: N/A;   CORONARY ANGIOPLASTY WITH STENT  PLACEMENT  04/01/2014   "2"   ENDOSCOPIC MUCOSAL RESECTION  09/02/2021   Procedure: ENDOSCOPIC MUCOSAL RESECTION;  Surgeon: Rush Landmark Telford Nab., MD;  Location: [place];  Service: Gastroenterology;;   ENDOSCOPIC MUCOSAL RESECTION N/A 04/14/2022   Procedure: ENDOSCOPIC MUCOSAL RESECTION;  Surgeon: [name]., MD;  Location: [facility];  Service: Gastroenterology;  Laterality: N/A;   ESOPHAGEAL BRUSHING  04/14/2022   Procedure: ESOPHAGEAL BRUSHING;  Surgeon: [name]., MD;  Location: [facility];  Service: Gastroenterology;;   ESOPHAGEAL DILATION  06/27/2001   ESOPHAGOGASTRODUODENOSCOPY  06/27/2001   ERD, stricture dilated   ESOPHAGOGASTRODUODENOSCOPY (EGD) WITH PROPOFOL N/A 06/05/2021   Procedure: ESOPHAGOGASTRODUODENOSCOPY (EGD) WITH PROPOFOL;  Surgeon: Gatha Mayer, MD;  Location: [facility];  Service: Endoscopy;  Laterality: N/A;   ESOPHAGOGASTRODUODENOSCOPY (EGD) WITH PROPOFOL N/A 09/02/2021   Procedure: ESOPHAGOGASTRODUODENOSCOPY (EGD) WITH PROPOFOL;  Surgeon: Rush Landmark Telford Nab., MD;  Location: [place];  Service: Gastroenterology;  Laterality: N/A;   ESOPHAGOGASTRODUODENOSCOPY (EGD) WITH PROPOFOL N/A 04/14/2022   Procedure: ESOPHAGOGASTRODUODENOSCOPY (EGD) WITH PROPOFOL;  Surgeon: Rush Landmark Telford Nab., MD;  Location: [facility];  Service: Gastroenterology;  Laterality: N/A;   EXCISIONAL HEMORRHOIDECTOMY  ~ 2008   HEMOSTASIS CLIP PLACEMENT  09/02/2021   Procedure: HEMOSTASIS CLIP PLACEMENT;  Surgeon: Rush Landmark Telford Nab., MD;  Location: [place];  Service: Gastroenterology;;   HEMOSTASIS  CONTROL  04/14/2022   Procedure: HEMOSTASIS CONTROL;  Surgeon: [name]., MD;  Location: [facility];  Service: Gastroenterology;;   HOT HEMOSTASIS N/A 09/02/2021   Procedure: HOT HEMOSTASIS (ARGON PLASMA COAGULATION/BICAP);  Surgeon: [name]., MD;  Location: [employer];  Service: Gastroenterology;  Laterality: N/A;    HOT HEMOSTASIS N/A 04/14/2022   Procedure: HOT HEMOSTASIS (ARGON PLASMA COAGULATION/BICAP);  Surgeon: [name]., MD;  Location: [facility];  Service: Gastroenterology;  Laterality: N/A;   LEFT HEART CATHETERIZATION WITH CORONARY ANGIOGRAM N/A 03/26/2014   Procedure: LEFT HEART CATHETERIZATION WITH CORONARY ANGIOGRAM;  Surgeon: Peter M Martinique, MD;  Location: [facility] CATH LAB;  Service: Cardiovascular;  Laterality: N/A;   PERCUTANEOUS CORONARY STENT INTERVENTION (PCI-S) N/A 04/01/2014   Procedure: PERCUTANEOUS CORONARY STENT INTERVENTION (PCI-S);  Surgeon: Peter M Martinique, MD;  Location: [facility] CATH LAB;  Service: Cardiovascular;  Laterality: N/A;   POSTERIOR LAMINECTOMY / DECOMPRESSION LUMBAR SPINE  12/25/2009   L4 , Dr Sherwood Gambler   SHOULDER ARTHROSCOPY WITH ROTATOR CUFF REPAIR AND OPEN BICEPS TENODESIS Left 02/04/2019   Procedure: LEFT SHOULDER ARTHROSCOPY, BICEPS TENODESIS, MINI OPEN ROTATOR CUFF TEAR REPAIR;  Surgeon: Meredith Pel, MD;  Location: [place];  Service: Orthopedics;  Laterality: Left;   SUBMUCOSAL INJECTION  09/02/2021   Procedure: SUBMUCOSAL INJECTION;  Surgeon: Rush Landmark Telford Nab., MD;  Location: [place];  Service: Gastroenterology;;   SUBMUCOSAL LIFTING INJECTION  04/14/2022   Procedure: SUBMUCOSAL LIFTING INJECTION;  Surgeon: [name]., MD;  Location: [facility];  Service: Gastroenterology;;   TOTAL KNEE ARTHROPLASTY Right 05/11/2021   Procedure: TOTAL KNEE ARTHROPLASTY;  Surgeon: Paralee Cancel, MD;  Location: [facility];  Service: Orthopedics;  Laterality: Right;   Patient Active Problem List   Diagnosis Date Noted   IDA (iron deficiency anemia) 08/31/2021   Abnormal colonoscopy 08/29/2021   Tubular adenoma of ascending colon 08/29/2021   Hx of adenomatous colonic polyps 08/29/2021   PUD (peptic ulcer disease) 08/10/2021   S/P total knee arthroplasty, 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   Nephrolithiasis 03/05/2020   Labral tear of long head  of biceps tendon, left, sequela    Complete tear of left rotator cuff    Antiplatelet or antithrombotic long-term use 01/17/2017   Diverticulosis of colon (without mention of hemorrhage) 04/25/2011   Adenomatous colon polyp 03/17/2011   LOW BACK PAIN, CHRONIC 12/13/2007   Hyperlipidemia associated with type 2 diabetes mellitus ([place]) 06/08/2007   T2DM (type 2 diabetes mellitus) ([place]) 02/27/2007   Hypertension associated with diabetes ([place]) 02/27/2007   AMBLYOPIA 12/06/2006   GERD 12/06/2006    PCP: Vivi Barrack, MD   REFERRING PROVIDER: Lanae Crumbly, PA-C   REFERRING DIAG: [phone] (ICD-10-CM) - Other spondylosis with radiculopathy, cervical region  THERAPY DIAG: Other spondylosis with radiculopathy, cervical region  Rationale for Evaluation and Treatment: Rehabilitation  ONSET DATE: 1 month  SUBJECTIVE:  SUBJECTIVE STATEMENT: 71 year old white male is new patient to clinic comes in with complaints of neck stiffness and 5 days of left upper extremity radiculopathy. Patient states he has had off-and-on neck stiffness for years but the left arm symptoms are new. No injury. States that he has pain numbness and tingling rating down his arm to the ulnar aspect of his left hand. No right arm symptoms. If he bends forward at times with his neck flexed that increases the pain going down his arm. Patient unable to take oral NSAIDs due to him being on Plavix. History of heart stents. He had previous left shoulder scope with rotator cuff repair by Dr. Marlou Sa a couple years ago.  PERTINENT HISTORY:  1. Left arm pain   2. Other spondylosis with radiculopathy, cervical region       Plan: I am somewhat limited as to what medications I can give patient.  He is unable to take oral NSAIDs due to being on  Plavix and I do not want to give a prednisone taper with his history of diabetes.  I did advise him that he can contact his primary care provider to see if they are comfortable prescribing a prednisone taper to see if this helps relieve his radicular symptoms.  I sent in prescription for Robaxin for spasms.  Will order formal PT for a few weeks.  Follow-up in 3 weeks with Dr. Laurance Flatten for recheck.  If he continues to be symptomatic he will likely need cervical spine MRI throughout HNP/stenosis.   Follow-Up Instructions: Return in about 3 weeks (around 06/16/2022) for WITH DR MOORE RECHECK NECK PAIN AND LEFT UE RADICULOPATHY.   PAIN:  Are you having pain? Yes: NPRS scale: 4/10 Pain location: LUE Pain description: ache Aggravating factors: bending forward Relieving factors: rest  PRECAUTIONS: [name] [name]  WEIGHT BEARING RESTRICTIONS: No  FALLS:  Has patient fallen in last 6 months? No  LIVING ENVIRONMENT: Lives with: lives with their family  OCCUPATION: retired  PLOF: Independent  PATIENT GOALS: To educe and manage my symptoms  NEXT MD VISIT:   OBJECTIVE:   DIAGNOSTIC FINDINGS:  [name] [name]  PATIENT SURVEYS:  FOTO 52(74 predicted)  COGNITION: Overall cognitive status: Within functional limits for tasks assessed  SENSATION: Not tested  POSTURE:  elevated L shoulder  PALPATION: TTP L scalenes, levator scapula and infraspinatus   CERVICAL ROM:   Active ROM A/PROM (deg) eval  Flexion 90%  Extension 50%  Right lateral flexion 25%  Left lateral flexion 25%  Right rotation 75%  Left rotation 75%   (Blank rows = not tested)  UPPER EXTREMITY ROM: WFL  Active ROM Right eval Left eval  Shoulder flexion    Shoulder extension    Shoulder abduction    Shoulder adduction    Shoulder extension    Shoulder internal rotation    Shoulder external rotation    Elbow flexion    Elbow extension    Wrist flexion    Wrist extension    Wrist ulnar deviation    Wrist radial deviation     Wrist pronation    Wrist supination     (Blank rows = not tested)  UPPER EXTREMITY MMT:  MMT Right eval Left eval  Shoulder flexion 5 4+  Shoulder extension 5 4+  Shoulder abduction 5 4+  Shoulder adduction    Shoulder extension    Shoulder internal rotation    Shoulder external rotation    Middle trapezius    Lower trapezius    Elbow  flexion    Elbow extension    Wrist flexion    Wrist extension    Wrist ulnar deviation    Wrist radial deviation    Wrist pronation    Wrist supination    Grip strength 83 66                                                              17             20  CERVICAL SPECIAL TESTS:  Spurling's test: Negative  TODAY'S TREATMENT:                                                                                                                              DATE: Eval and HEP  PATIENT EDUCATION:  Education details: Patient to demonstrate independence in HEP  Person educated: Patient Education method: Explanation Education comprehension: verbalized understanding and needs further education  HOME EXERCISE PROGRAM: Access Code: [email or web address] URL: [email or web address] Date: 06/06/2022 Prepared by: Sharlynn Oliphant  Exercises - Seated Cervical Sidebending Stretch  - 2 x daily - 5 x weekly - 1 sets - 30s hold - Seated Shoulder Shrugs  - 2 x daily - 5 x weekly - 1 sets - 10 reps - Seated Scapular Retraction  - 2 x daily - 5 x weekly - 1 sets - 10 reps  ASSESSMENT:  CLINICAL IMPRESSION: Patient is a 71 y.o. male who was seen today for physical therapy evaluation and treatment for cervical and L arm pain. Patient presents with decreased cervical ROM most notable in B SB but no LUE pain elicited, negative Spurlings, grip and pinch strengths functional, TTP across L levator and infraspinatus.  BUE ROM is full and painfree.  Patient show S&S of L scalene syndrome.  OBJECTIVE IMPAIRMENTS: decreased activity tolerance, decreased knowledge of  condition, decreased mobility, decreased ROM, decreased strength, increased fascial restrictions, increased muscle spasms, impaired UE functional use, postural dysfunction, and pain.   ACTIVITY LIMITATIONS: carrying, lifting, bending, and reach over head  PERSONAL FACTORS: Age, Fitness, and Time since onset of injury/illness/exacerbation are also affecting patient's functional outcome.   REHAB POTENTIAL: Good  CLINICAL DECISION MAKING: Stable/uncomplicated  EVALUATION COMPLEXITY: Low   GOALS: Goals reviewed with patient? No  SHORT TERM GOALS: Target date: 06/20/2022    Patient to demonstrate independence in HEP  Baseline: [id number] Goal status: INITIAL  2.  Increase B SB to 50% Baseline: 25% B Goal status: INITIAL   LONG TERM GOALS: Target date: 08/15/2022    Increase FOTO score to 74 Baseline: 52 Goal status: INITIAL  2.  Decrease pain to 2/10 Baseline: 4/10 Goal status: INITIAL  3.  Patient to bend forward to tie shoes w/o pain/symptoms Baseline: LUE symptoms when tying  shoes Goal status: INITIAL   PLAN:  PT FREQUENCY: 2x/week  PT DURATION: 4 weeks  PLANNED INTERVENTIONS: Therapeutic exercises, Therapeutic activity, Neuromuscular re-education, Balance training, Gait training, Patient/Family education, Self Care, Joint mobilization, Dry Needling, Manual therapy, and Re-evaluation  PLAN FOR NEXT SESSION: HEP review and update, ROM and stretching postural training, manual techniques as indicated   Lanice Shirts, PT 06/06/2022, 1:24 PM

## 2022-06-07 NOTE — Progress Notes (Signed)
Chronic Care Management Pharmacy Note  06/17/2022 Name:  [name] [name] MRN:  [id number] DOB:  Feb 25, 1951   Summary:  BP improved with HCTZ addition!  Now at goal.  He inquires about switching to Crestor due to better side effect profile. Also plans to start monitoring FBG so he can see how foods effect his glucose.  Target 90-130 in the morning!  Recommendations/Changes made from today's visit: Consider switch to Crestor for better side effect profile.  Start to check FBG - recheck A1c within 6 months of previous.  Consider increased metformin if still elevated.  FU 4 months with me CMA to check glucose, BP in 30 days  Subjective: [name] [name] is an 71 y.o. year old male who is a primary patient of [name], Algis Greenhouse, MD.  The CCM team was consulted for assistance with disease management and care coordination needs.    Engaged with patient by telephone for follow up visit in response to provider referral for pharmacy case management and/or care coordination services.   Consent to Services:  The patient was given information about Chronic Care Management services, agreed to services, and gave verbal consent prior to initiation of services.  Please see initial visit note for detailed documentation.   Patient Care Team: Vivi Barrack, MD as PCP - General (Family Medicine) Martinique, Peter M, MD as PCP - Cardiology (Cardiology) Calton Dach, MD as Consulting Physician (Optometry) Marlou Sa, Tonna Corner, MD as Consulting Physician (Orthopedic Surgery) [facility] as Consulting Physician Edythe Clarity, [facility] (Pharmacist)  Hospital visits: None in previous 6 months  Objective:  Lab Results  Component Value Date   CREATININE 0.94 03/15/2022   CREATININE 0.91 08/27/2021   CREATININE 1.04 06/04/2021    Lab Results  Component Value Date   HGBA1C 7.3 (A) 03/15/2022   Last diabetic Eye exam:  Lab Results  Component Value Date/Time   HMDIABEYEEXA No  Retinopathy 05/06/2022 12:00 AM    Last diabetic Foot exam: No results found for: "HMDIABFOOTEX"      Component Value Date/Time   CHOL 110 09/02/2020 1010   CHOL 132 08/18/2016 0814   TRIG 100.0 09/02/2020 1010   TRIG 82 04/11/2006 0804   HDL 35.80 (L) 09/02/2020 1010   HDL 41 08/18/2016 0814   CHOLHDL 3 09/02/2020 1010   VLDL 20.0 09/02/2020 1010   LDLCALC 54 09/02/2020 1010   LDLCALC 68 03/05/2020 1009       Latest Ref Rng & Units 03/15/2022   10:31 AM 06/04/2021   12:31 PM 04/30/2021   11:02 AM  Hepatic Function  Total Protein 6.0 - 8.3 g/dL 6.8  6.3  6.8   Albumin 3.5 - 5.2 g/dL 3.9  3.5  4.1   AST 0 - 37 U/L _0 ALT 0 - 53 U/L _1 Alk Phosphatase 39 - 117 U/L 82  84  86   Total Bilirubin 0.2 - 1.2 mg/dL 0.6  0.7  0.9     Lab Results  Component Value Date/Time   TSH 1.04 03/15/2022 10:31 AM   TSH 0.96 09/02/2020 10:10 AM       Latest Ref Rng & Units 03/15/2022   10:31 AM 12/14/2021    9:53 AM 11/10/2021   10:15 AM  CBC  WBC 4.0 - 10.5 K/uL 7.3  6.7  7.2   Hemoglobin 13.0 - 17.0 g/dL 14.1  14.4  12.9   Hematocrit  39.0 - 52.0 % 41.9  43.2  38.8   Platelets 150.0 - 400.0 K/uL 208.0  203.0  217.0     No results found for: "VD25OH"  Clinical ASCVD:  The ASCVD Risk score (Arnett DK, et al., 2019) failed to calculate for the following reasons:   The valid total cholesterol range is 130 to 320 mg/dL    Other: (CHADS2VASc if Afib, PHQ9 if depression, MMRC or CAT for COPD, ACT, DEXA)  Social History   Tobacco Use  Smoking Status Never  Smokeless Tobacco Never   BP Readings from Last 3 Encounters:  06/17/22 110/71  06/01/22 (!) 144/70  05/30/22 (!) 149/79   Pulse Readings from Last 3 Encounters:  06/17/22 61  06/01/22 (!) 53  05/30/22 64   Wt Readings from Last 3 Encounters:  06/01/22 188 lb 6.4 oz (85.5 kg)  05/30/22 187 lb (84.8 kg)  05/26/22 180 lb (81.6 kg)    Assessment: Review of patient past medical history, allergies,  medications, health status, including review of consultants reports, laboratory and other test data, was performed as part of comprehensive evaluation and provision of chronic care management services.   SDOH:  (Social Determinants of Health) assessments and interventions performed:  Financial Resource Strain: Low Risk  (03/24/2022)   Overall Financial Resource Strain (CARDIA)    Difficulty of Paying Living Expenses: Not hard at all   Food Insecurity: No Food Insecurity (03/24/2022)   Hunger Vital Sign    Worried About Running Out of Food in the Last Year: Never true    Ran Out of Food in the Last Year: Never true    SDOH Interventions    Flowsheet Row Clinical Support from 03/24/2022 in [place] from 03/18/2021 in [place] Management from 05/05/2020 in [place] Interventions Intervention Not Indicated -- Other (Comment)  Housing Interventions Intervention Not Indicated Intervention Not Indicated --  Transportation Interventions Intervention Not Indicated Intervention Not Indicated Other (Comment)  Financial Strain Interventions Intervention Not Indicated Intervention Not Indicated --  Physical Activity Interventions Intervention Not Indicated Intervention Not Indicated --  Stress Interventions Intervention Not Indicated -- --  Social Connections Interventions Intervention Not Indicated Intervention Not Indicated --       CCM Care Plan  Allergies  Allergen Reactions   Aspirin Hives, Swelling and Other (See Comments)    Angioedema   Oxycodone Nausea Only   Contrast Media [Iodinated Contrast Media] Hives, Itching, Rash and Other (See Comments)    Developed hives despite pre-treatment   Norvasc [Amlodipine Besylate] Other (See Comments) and Cough    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 Sig Documenting Provider Last Dose Status Informant  Accu-Chek Softclix Lancets lancets [id number] Yes 1 each by Other route 3 (three) times daily. Vivi Barrack, MD Taking Active Self  atorvastatin (LIPITOR) 80 MG tablet [id number] Yes TAKE 1 TABLET DAILY AT     6:00PM Vivi Barrack, MD Taking Active   Blood Glucose Monitoring Suppl (ACCU-CHEK AVIVA PLUS) w/Device KIT [id number] Yes Check glucose TID or as needed E11.9 Vivi Barrack, MD Taking Active Self  carvedilol (COREG) 3.125 MG tablet [id number] Yes TAKE 1 TABLET TWICE DAILY  WITH MEALS Vivi Barrack, MD  Taking Active   clopidogrel (PLAVIX) 75 MG tablet [id number] Yes TAKE 1 TABLET DAILY Vivi Barrack, MD Taking Active   Coenzyme Q10 200 MG capsule [id number] Yes Take 200 mg by mouth daily. gummie [provider] Taking Active Self  famotidine-calcium carbonate-magnesium hydroxide (PEPCID COMPLETE) 10-800-165 MG chewable tablet [id number] Yes Chew 0.5 tablets by mouth every 6 (six) hours as needed (heartburn). [provider] Taking Active Self  ferrous sulfate 325 (65 FE) MG tablet [id number] Yes Take 325 mg by mouth daily. [provider] Taking Active Self  glucose blood test strip [id number] Yes Use as instructed Vivi Barrack, MD Taking Active Self  hydrochlorothiazide (HYDRODIURIL) 25 MG tablet [id number] Yes Take 1 tablet (25 mg total) by mouth daily. Martinique, Peter M, MD Taking Active   irbesartan (AVAPRO) 300 MG tablet [id number] Yes TAKE 1 TABLET DAILY Vivi Barrack, MD Taking Active   metFORMIN (GLUCOPHAGE-XR) 500 MG 24 hr tablet [id number] Yes TAKE 2 TABLETS DAILY AFTER SUPPER Vivi Barrack, MD Taking Active Self  methocarbamol (ROBAXIN) 500 MG tablet [id number] Yes Take 1 tablet (500 mg total) by mouth every 8 (eight) hours as needed for muscle spasms. Lanae Crumbly, PA-C Taking  Active   Multiple Vitamins-Minerals (MULTIVITAMIN WITH MINERALS) tablet [id number] Yes Take 1 tablet by mouth daily. [provider] Taking Active Self  nitroGLYCERIN (NITROSTAT) 0.4 MG SL tablet [id number] Yes Place 1 tablet (0.4 mg total) under the tongue every 5 (five) minutes as needed for chest pain. Martinique, Peter M, MD Taking Active Self           Med Note Wilmon Pali, MELISSA R   Thu Aug 26, 2021  2:44 PM)    pantoprazole (PROTONIX) 40 MG tablet [id number] Yes TAKE 1 TABLET TWICE DAILY  BEFORE MEALS  Patient taking differently: Take 40 mg by mouth daily.   Zehr, Laban Emperor, PA-C Taking Active Self  predniSONE (DELTASONE) 50 MG tablet [id number]  Take 1 tablet daily for 5 days. Then take 1/2 tablet daily for 2 days. Vivi Barrack, MD  Active   sildenafil (REVATIO) 20 MG tablet [id number] Yes Take 1-5 tablets by mouth daily as needed. Vivi Barrack, MD Taking Active Self  sodium chloride (OCEAN) 0.65 % SOLN nasal spray [id number] Yes Place 1 spray into both nostrils as needed for congestion. [provider] Taking Active Self  spironolactone (ALDACTONE) 25 MG tablet [id number] Yes TAKE 1 TABLET DAILY Vivi Barrack, MD Taking Active   Undecylenic Ac-Zn Undecylenate (FUNGI-NAIL TOE & FOOT EX) [id number] Yes Apply 1 application topically See admin instructions. Apply to bilateral big toes every other day [provider] Taking Active Self  vitamin B-12 (CYANOCOBALAMIN) 1000 MCG tablet [id number] Yes Take 1,000 mcg by mouth daily. [provider] Taking Active Self            Patient Active Problem List   Diagnosis Date Noted   IDA (iron deficiency anemia) 08/31/2021   Abnormal colonoscopy 08/29/2021   Tubular adenoma of ascending colon 08/29/2021   Hx of adenomatous colonic polyps 08/29/2021   PUD (peptic ulcer disease) 08/10/2021   S/P total knee arthroplasty, 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   Nephrolithiasis 03/05/2020   Labral tear of long head of biceps tendon, left,  sequela    Complete tear of left rotator cuff    Antiplatelet or antithrombotic long-term use 01/17/2017   Diverticulosis of colon (without mention of hemorrhage) 04/25/2011   Adenomatous colon polyp 03/17/2011   LOW BACK PAIN, CHRONIC 12/13/2007  Hyperlipidemia associated with type 2 diabetes mellitus ([place]) 06/08/2007   T2DM (type 2 diabetes mellitus) ([place]) 02/27/2007   Hypertension associated with diabetes ([place]) 02/27/2007   AMBLYOPIA 12/06/2006   GERD 12/06/2006    Immunization History  Administered Date(s) Administered   Fluad Quad(high Dose 65+) 02/21/2019, 04/07/2020, 03/08/2021, 03/15/2022   Influenza,inj,Quad PF,6+ Mos 03/19/2014   PFIZER Comirnaty(Gray Top)Covid-19 Tri-Sucrose Vaccine 03/28/2022   PFIZER(Purple Top)SARS-COV-2 Vaccination 08/01/2019, 08/27/2019, 03/25/2020, 03/12/2021   Pneumococcal Conjugate-13 01/17/2017   Pneumococcal Polysaccharide-23 01/31/2018   Respiratory Syncytial Virus Vaccine,Recomb Aduvanted(Arexvy) 03/28/2022   Td 12/18/2008   Tdap 04/07/2020   Vaccinia,smallpox Monkeypox Vaccine Live,pf 10/20/1955   Zoster, Live 12/24/2012    Conditions to be addressed/monitored: HLD HTN DMII OA   Care Plan : [place]  Updates made by Edythe Clarity, RPH since 06/17/2022 12:00 AM     Problem: HLD HTN DMII OA   Priority: High     Long-Range Goal: Disease Management   Start Date: 12/01/2020  Expected End Date: 12/01/2021  Recent Progress: On track  Priority: High  Note:   Current Barriers:  Elevated BP  Interventions: 1:1 collaboration with Vivi Barrack, MD regarding development and update of comprehensive plan of care as evidenced by provider attestation and co-signature Inter-disciplinary care team collaboration (see longitudinal plan of care) Comprehensive medication review performed; medication list updated in electronic medical record No Rx changes  Hypertension (BP goal <140/90) 06/17/22 -Controlled -Current  treatment: Irbesartan 300 mg once daily Appropriate, Query effective, ,  Carvedilol 3.125 mg twice daily Appropriate, Query effective, Spironolactone 25 mg once daily Appropriate, Query effective HCTZ 3m Appropriate, Effective, Safe, Accessible (new start) -Current home readings: at goal -110/71 today at home P 61 -Current dietary habits: some processed foods and carbs - rice, bagels -Current exercise habits: recent knee surgery -Denies hypotensive/hypertensive symptoms -Educated on Symptoms of hypotension and importance of maintaining adequate hydration; -BP much better since the addition of HCTZ.  Counseled to continue to monitor at home. Feeling somewhat lethargic but also has a cold Contact uKoreaif this does not improve. Will update BP in chart! Continue same meds.  Update 03/30/21 155/80 this morning, patient was dealing with stressor of switching cellular phones.  Normally controlled at home per pt and has been controlled previously in office.  No changes to medications.  Reports 100% adherence.  Recommend he check BP at least once or twice weekly and reports consistently elevated BP's to myself if < 140/90.  Update 12/06/21 BP was elevated last two in office readings. He has not been checking at home. We discussed the need to monitor at home and record. He agrees to do so at least once or twice weekly.   Call me if consistently > 140/90. Will have CMA FU in 30 days to assess home readings.  Hyperlipidemia: (LDL goal < 70) 06/17/22 -Controlled, LDL below goal -Current treatment: Atorvastatin 80 mg once daily Appropriate, Effective, Safe, Accessible -Educated on Cholesterol goals;  -Recommended to continue current medication -Was possibly interested in switching to Crestor due to perceived better side effect profile. Will consult with PCP - would consider Crestor 464mfor equivalent dosing.  Diabetes (A1c goal <7%) 06/17/22 -Not ideally controlled, most recent A1c  7.3% -Current medications: Metformin 500 mg XR - two tablets once daily after supper Appropriate, Query effective, ,  -Medications previously tried: IR (did not tolerate) -Current home glucose readings fasting glucose: not testing post prandial glucose: not testing -Educated on A1c and blood sugar  goals; -He is adherent with metformin, not checking glucose at home. -Discussed today to check fasting glucose at least a few times per week.  Aim for FBG 90-130. -Reports he is eating more sweets than he should, counseled to cut back on this and increase exercise. Recheck A1c within 6 months. Could increase metformin if continues to be elevated. CMA to check glucose next month.  Update 03/30/21 Continues to tolerate XR metformin.  Takes all at one time which has improved adherence.  A1c has decreased to 7.0.  He is now more conscious of what he is eating.  Trying to substitute protein shakes with fruit as snacks to fill him up and limit cravings.  Continue current meds.  Update 12/06/21 Continues to take metformin and tolerating. A1c continues to improve. Most recent A1c was 6.8 No changes needed at this time continue routine screenings.            Compliance/Adherence/Medication fill history: Care Gaps: Foot Exam  Star-Rating Drugs: Irbesartan 3100m 06/02/22 90ds Atorvastatin 816m12/07/23 90ds  Pharmacist Clinical Goal(s):  Patient will contact provider office for questions/concerns as evidenced notation of same in electronic health record through collaboration with PharmD and provider.   Patient Goals/Self-Care Activities Patient will:  - take medications as prescribed target a minimum of 150 minutes of moderate intensity exercise weekly  Follow Up Plan:  RPCastor/u call 6 months Medication Assistance: None required.  Patient affirms current coverage meets needs.  Patient's preferred pharmacy is:  [id number] [place] [place]one: [phone]: [phone] Registered Caremark Sites One [id number]: [phone]: [phone] Follow Up:  Patient agrees to Care Plan and Follow-up.  Future Appointments  Date Time Provider [place]12/22/2023 10:45 AM ZiCathie BeamsP[facility]12/27/2023 10:00 AM ZiCathie BeamsPSouthwest [facility] LLCP[facility]12/29/2023  9:15 AM ZiLanice ShirtsPT [facility]1/08/2022 10:45 AM ZiLanice ShirtsPT OPSelect [facility] IncPG[facility]1/10/2022 10:45 AM ZiLanice ShirtsPT OPCura[facility] StoughtonPCit[facility]1/03/2023 10:45 AM ZiLanice ShirtsPT OPFroedtert South [address] [facility]atherines [facility] CenterPO[facility]1/04/2023  1:00 PM MoCallie FieldingMD [facility] None  07/08/2022 10:45 AM ZiLanice ShirtsPT OPMetro Health Medical CenterPNorth [facility]3/20/2024  9:00 AM PaVivi BarrackMD [facility] PEC  04/06/2023  9:30 AM [facility] HEALTH COACH LBJessiePharmD Clinical Pharmacist  [facility] [phone]

## 2022-06-13 NOTE — Therapy (Signed)
OUTPATIENT PHYSICAL THERAPY TREATMENT NOTE   Patient Name: [name] [name] MRN: [id number] DOB:Jul 04, 1950, 71 y.o., male Today's Date: 06/14/2022  PCP: Vivi Barrack, MD   REFERRING PROVIDER: Lanae Crumbly, PA-C    END OF SESSION:   PT End of Session - 06/14/22 1038     Visit Number 2    Number of Visits 8    Date for PT Re-Evaluation 08/01/22    Authorization Type MCR    PT Start Time 1040    PT Stop Time 1120    PT Time Calculation (min) 40 min    Activity Tolerance Patient tolerated treatment well    Behavior During Therapy [facility] for tasks assessed/performed             Past Medical History:  Diagnosis Date   Adenomatous colon polyp 09/07/2004   Amblyopia    CAD (coronary artery disease)    cardiac CT with a 50% LAD lesion in 2007   Diverticulosis    DM2 (diabetes mellitus, type 2) (HCC)    Elevated LFTs    secondary to labetalol   Esophageal stricture 2003   External hemorrhoids 2012   lanced by Dr Rise Patience   Family history of anesthesia complication    "sister had PONV"   GERD (gastroesophageal reflux disease)    esophageal stricture, pmh   Hiatal hernia 01/04/2000   History of kidney stones 2022   HLD (hyperlipidemia)    HTN (hypertension)    Hypopotassemia    Past Surgical History:  Procedure Laterality Date   BIOPSY  06/05/2021   Procedure: BIOPSY;  Surgeon: Gatha Mayer, MD;  Location: [facility];  Service: Endoscopy;;   BIOPSY  09/02/2021   Procedure: BIOPSY;  Surgeon: [name]., MD;  Location: [place];  Service: Gastroenterology;;   COLONOSCOPY  06/27/2004   tics , ADENOMATOUS polyps. F/U due 2011   COLONOSCOPY  06/27/2010   Tics   COLONOSCOPY WITH PROPOFOL N/A 09/02/2021   Procedure: COLONOSCOPY WITH PROPOFOL;  Surgeon: Rush Landmark Telford Nab., MD;  Location: [place];  Service: Gastroenterology;  Laterality: N/A;   COLONOSCOPY WITH PROPOFOL N/A 04/14/2022   Procedure: COLONOSCOPY WITH PROPOFOL;  Surgeon:  Rush Landmark Telford Nab., MD;  Location: [facility];  Service: Gastroenterology;  Laterality: N/A;   CORONARY ANGIOPLASTY WITH STENT PLACEMENT  04/01/2014   "2"   ENDOSCOPIC MUCOSAL RESECTION  09/02/2021   Procedure: ENDOSCOPIC MUCOSAL RESECTION;  Surgeon: Rush Landmark Telford Nab., MD;  Location: [place];  Service: Gastroenterology;;   ENDOSCOPIC MUCOSAL RESECTION N/A 04/14/2022   Procedure: ENDOSCOPIC MUCOSAL RESECTION;  Surgeon: [name]., MD;  Location: [facility];  Service: Gastroenterology;  Laterality: N/A;   ESOPHAGEAL BRUSHING  04/14/2022   Procedure: ESOPHAGEAL BRUSHING;  Surgeon: [name]., MD;  [facility];  Service: Gastroenterology;;   ESOPHAGEAL DILATION  06/27/2001   ESOPHAGOGASTRODUODENOSCOPY  06/27/2001   ERD, stricture dilated   ESOPHAGOGASTRODUODENOSCOPY (EGD) WITH PROPOFOL N/A 06/05/2021   Procedure: ESOPHAGOGASTRODUODENOSCOPY (EGD) WITH PROPOFOL;  Surgeon: Gatha Mayer, MD;  [facility];  Service: Endoscopy;  Laterality: N/A;   ESOPHAGOGASTRODUODENOSCOPY (EGD) WITH PROPOFOL N/A 09/02/2021   Procedure: ESOPHAGOGASTRODUODENOSCOPY (EGD) WITH PROPOFOL;  Surgeon: Rush Landmark Telford Nab., MD;  Location: [place];  Service: Gastroenterology;  Laterality: N/A;   ESOPHAGOGASTRODUODENOSCOPY (EGD) WITH PROPOFOL N/A 04/14/2022   Procedure: ESOPHAGOGASTRODUODENOSCOPY (EGD) WITH PROPOFOL;  Surgeon: Rush Landmark Telford Nab., MD;  [facility];  Service: Gastroenterology;  Laterality: N/A;   EXCISIONAL HEMORRHOIDECTOMY  ~ 2008   HEMOSTASIS  CLIP PLACEMENT  09/02/2021   Procedure: HEMOSTASIS CLIP PLACEMENT;  Surgeon: [name]., MD;  Location: [place];  Service: Gastroenterology;;   HEMOSTASIS CONTROL  04/14/2022   Procedure: HEMOSTASIS CONTROL;  Surgeon: [name]., MD;  Location[facility];  Service: Gastroenterology;;   HOT HEMOSTASIS N/A 09/02/2021   Procedure: HOT HEMOSTASIS (ARGON  PLASMA COAGULATION/BICAP);  Surgeon: [name]., MD;  Location: [place];  Service: Gastroenterology;  Laterality: N/A;   HOT HEMOSTASIS N/A 04/14/2022   Procedure: HOT HEMOSTASIS (ARGON PLASMA COAGULATION/BICAP);  Surgeon: [name]., MD;  Location: [facility];  Service: Gastroenterology;  Laterality: N/A;   LEFT HEART CATHETERIZATION WITH CORONARY ANGIOGRAM N/A 03/26/2014   Procedure: LEFT HEART CATHETERIZATION WITH CORONARY ANGIOGRAM;  Surgeon: Peter M Martinique, MD;  Location: [facility];  Service: Cardiovascular;  Laterality: N/A;   PERCUTANEOUS CORONARY STENT INTERVENTION (PCI-S) N/A 04/01/2014   Procedure: PERCUTANEOUS CORONARY STENT INTERVENTION (PCI-S);  Surgeon: Peter M Martinique, MD;  [facility]: [facility] C[facility] LAB;  Service: Cardiovascular;  Laterality: N/A;   POSTERIOR LAMINECTOMY / DECOMPRESSION LUMBAR SPINE  12/25/2009   L4 , Dr Sherwood Gambler   SHOULDER ARTHROSCOPY WITH ROTATOR CUFF REPAIR AND OPEN BICEPS TENODESIS Left 02/04/2019   Procedure: LEFT SHOULDER ARTHROSCOPY, BICEPS TENODESIS, MINI OPEN ROTATOR CUFF TEAR REPAIR;  Surgeon: Meredith Pel, MD;  Location: [place];  Service: Orthopedics;  Laterality: Left;   SUBMUCOSAL INJECTION  09/02/2021   Procedure: SUBMUCOSAL INJECTION;  Surgeon: Rush Landmark Telford Nab., MD;  Location: [place];  Service: Gastroenterology;;   SUBMUCOSAL LIFTING INJECTION  04/14/2022   Procedure: SUBMUCOSAL LIFTING INJECTION;  Surgeon: [name]., MD;  Location: [facility];  Service: Gastroenterology;;   TOTAL KNEE ARTHROPLASTY Right 05/11/2021   Procedure: TOTAL KNEE ARTHROPLASTY;  Surgeon: Paralee Cancel, MD;  Location: [facility];  Service: Orthopedics;  Laterality: Right;   Patient Active Problem List   Diagnosis Date Noted   IDA (iron deficiency anemia) 08/31/2021   Abnormal colonoscopy 08/29/2021   Tubular adenoma of ascending colon 08/29/2021   Hx of adenomatous colonic polyps 08/29/2021   PUD  (peptic ulcer disease) 08/10/2021   S/P total knee arthroplasty, 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   Nephrolithiasis 03/05/2020   Labral tear of long head of biceps tendon, left, sequela    Complete tear of left rotator cuff    Antiplatelet or antithrombotic long-term use 01/17/2017   Diverticulosis of colon (without mention of hemorrhage) 04/25/2011   Adenomatous colon polyp 03/17/2011   LOW BACK PAIN, CHRONIC 12/13/2007   Hyperlipidemia associated with type 2 diabetes mellitus ([place]) 06/08/2007   T2DM (type 2 diabetes mellitus) ([place]) 02/27/2007   Hypertension associated with diabetes ([place]) 02/27/2007   AMBLYOPIA 12/06/2006   GERD 12/06/2006    REFERRING DIAG: M47.22 (ICD-10-CM) - Other spondylosis with radiculopathy, cervical region   THERAPY DIAG:  Cervicalgia  Radiculopathy, cervical region  Rationale for Evaluation and Treatment Rehabilitation  PERTINENT HISTORY:  1. Left arm pain   2. Other spondylosis with radiculopathy, cervical region       Plan: I am somewhat limited as to what medications I can give patient.  He is unable to take oral NSAIDs due to being on Plavix and I do not want to give a prednisone taper with his history of diabetes.  I did advise him that he can contact his primary care provider to see if they are comfortable prescribing a prednisone taper to see if this helps relieve his radicular symptoms.  I sent in prescription for Robaxin  for spasms.  Will order formal PT for a few weeks.  Follow-up in 3 weeks with Dr. Laurance Flatten for recheck.  If he continues to be symptomatic he will likely need cervical spine MRI throughout HNP/stenosis.   Follow-Up Instructions: Return in about 3 weeks (around 06/16/2022) for WITH DR MOORE RECHECK NECK PAIN AND LEFT UE RADICULOPATHY.     PRECAUTIONS: None  SUBJECTIVE:                                                                                                                                                                                       SUBJECTIVE STATEMENT:  Patient reports his pain is "on and off" and hurts more at night time and when he bends over.    PAIN:  Are you having pain? Yes: NPRS scale: 2-3/10 Pain location: LUE Pain description: ache Aggravating factors: bending forward Relieving factors: rest   OBJECTIVE: (objective measures completed at initial evaluation unless otherwise dated)   DIAGNOSTIC FINDINGS:  none   PATIENT SURVEYS:  FOTO 52(74 predicted)   COGNITION: Overall cognitive status: Within functional limits for tasks assessed   SENSATION: Not tested   POSTURE:  elevated L shoulder   PALPATION: TTP L scalenes, levator scapula and infraspinatus          CERVICAL ROM:    Active ROM A/PROM (deg) eval  Flexion 90%  Extension 50%  Right lateral flexion 25%  Left lateral flexion 25%  Right rotation 75%  Left rotation 75%   (Blank rows = not tested)   UPPER EXTREMITY ROM: WFL   Active ROM Right eval Left eval  Shoulder flexion      Shoulder extension      Shoulder abduction      Shoulder adduction      Shoulder extension      Shoulder internal rotation      Shoulder external rotation      Elbow flexion      Elbow extension      Wrist flexion      Wrist extension      Wrist ulnar deviation      Wrist radial deviation      Wrist pronation      Wrist supination       (Blank rows = not tested)   UPPER EXTREMITY MMT:   MMT Right eval Left eval  Shoulder flexion 5 4+  Shoulder extension 5 4+  Shoulder abduction 5 4+  Shoulder adduction      Shoulder extension      Shoulder internal rotation      Shoulder external rotation      Middle trapezius  Lower trapezius      Elbow flexion      Elbow extension      Wrist flexion      Wrist extension      Wrist ulnar deviation      Wrist radial deviation      Wrist pronation      Wrist supination      Grip strength 83 66                                                              17             20   CERVICAL  SPECIAL TESTS:  Spurling's test: Negative   TODAY'S TREATMENT:                [facility] Adult PT Treatment:                                                DATE: 06/14/2022 Therapeutic Exercise: UBE level 2 3/3 fwd/bwd Rows GTB 3x10 Shoulder extension GTB 3x10 Pball roll up wall with end range stretch x10 Doorway pec stretch 2x30" Seated horizontal abduction RTB 2x10 Upper trap stretch 2x30" BIL Levator scap stretch 2x30" BIL Manual Therapy: STM BIL upper traps Positional release BIL upper traps                                                                                                                 DATE: Eval and HEP   PATIENT EDUCATION:  Education details: Patient to demonstrate independence in HEP  Person educated: Patient Education method: Explanation Education comprehension: verbalized understanding and needs further education   HOME EXERCISE PROGRAM: Access Code: [email or web address] URL: [email or web address] Date: 06/06/2022 Prepared by: Sharlynn Oliphant   Exercises - Seated Cervical Sidebending Stretch  - 2 x daily - 5 x weekly - 1 sets - 30s hold - Seated Shoulder Shrugs  - 2 x daily - 5 x weekly - 1 sets - 10 reps - Seated Scapular Retraction  - 2 x daily - 5 x weekly - 1 sets - 10 reps   ASSESSMENT:   CLINICAL IMPRESSION: Patient presents to first follow up PT session reporting mild current pain and reports that his pain is highest at night and also reports sporadic HEP compliance. Session today focused on RTC and periscapular strengthening as well as cervical ROM and manual techniques to decrease tension. Patient was able to tolerate all prescribed exercises with no adverse effects. Patient continues to benefit from skilled PT services and should be progressed as able to improve functional independence.     OBJECTIVE IMPAIRMENTS: decreased activity tolerance, decreased knowledge of condition, decreased mobility, decreased ROM,  decreased strength, increased  fascial restrictions, increased muscle spasms, impaired UE functional use, postural dysfunction, and pain.    ACTIVITY LIMITATIONS: carrying, lifting, bending, and reach over head   PERSONAL FACTORS: Age, Fitness, and Time since onset of injury/illness/exacerbation are also affecting patient's functional outcome.    REHAB POTENTIAL: Good   CLINICAL DECISION MAKING: Stable/uncomplicated   EVALUATION COMPLEXITY: Low     GOALS: Goals reviewed with patient? No   SHORT TERM GOALS: Target date: 06/20/2022     Patient to demonstrate independence in HEP  Baseline: [id number] Goal status: INITIAL   2.  Increase B SB to 50% Baseline: 25% B Goal status: INITIAL     LONG TERM GOALS: Target date: 08/15/2022     Increase FOTO score to 74 Baseline: 52 Goal status: INITIAL   2.  Decrease pain to 2/10 Baseline: 4/10 Goal status: INITIAL   3.  Patient to bend forward to tie shoes w/o pain/symptoms Baseline: LUE symptoms when tying shoes Goal status: INITIAL     PLAN:   PT FREQUENCY: 2x/week   PT DURATION: 4 weeks   PLANNED INTERVENTIONS: Therapeutic exercises, Therapeutic activity, Neuromuscular re-education, Balance training, Gait training, Patient/Family education, Self Care, Joint mobilization, Dry Needling, Manual therapy, and Re-evaluation   PLAN FOR NEXT SESSION: HEP review and update, ROM and stretching postural training, manual techniques as indicated   Margarette Canada, PTA 06/14/2022, 11:08 AM

## 2022-06-14 ENCOUNTER — Ambulatory Visit: Payer: Medicare Other

## 2022-06-14 DIAGNOSIS — M542 Cervicalgia: Secondary | ICD-10-CM | POA: Diagnosis not present

## 2022-06-14 DIAGNOSIS — M4722 Other spondylosis with radiculopathy, cervical region: Secondary | ICD-10-CM | POA: Diagnosis not present

## 2022-06-14 DIAGNOSIS — M5412 Radiculopathy, cervical region: Secondary | ICD-10-CM | POA: Diagnosis not present

## 2022-06-15 DIAGNOSIS — Z96651 Presence of right artificial knee joint: Secondary | ICD-10-CM | POA: Diagnosis not present

## 2022-06-16 ENCOUNTER — Ambulatory Visit: Payer: Medicare Other | Admitting: Orthopedic Surgery

## 2022-06-17 ENCOUNTER — Ambulatory Visit: Payer: Medicare Other

## 2022-06-17 ENCOUNTER — Ambulatory Visit: Payer: Medicare Other | Admitting: Pharmacist

## 2022-06-17 VITALS — BP 110/71 | HR 61

## 2022-06-17 DIAGNOSIS — M5412 Radiculopathy, cervical region: Secondary | ICD-10-CM | POA: Diagnosis not present

## 2022-06-17 DIAGNOSIS — M542 Cervicalgia: Secondary | ICD-10-CM

## 2022-06-17 DIAGNOSIS — M4722 Other spondylosis with radiculopathy, cervical region: Secondary | ICD-10-CM | POA: Diagnosis not present

## 2022-06-17 DIAGNOSIS — E1165 Type 2 diabetes mellitus with hyperglycemia: Secondary | ICD-10-CM

## 2022-06-17 DIAGNOSIS — E1169 Type 2 diabetes mellitus with other specified complication: Secondary | ICD-10-CM

## 2022-06-17 NOTE — Patient Instructions (Addendum)
Visit Information   Goals Addressed             This Visit's Progress    Track and Manage My Blood Pressure-Hypertension   On track    Timeframe:  Long-Range Goal Priority:  High Start Date:   03/30/21                          Expected End Date: 09/28/21                       Follow Up Date 06/30/21    - check blood pressure weekly - choose a place to take my blood pressure (home, clinic or office, retail store) - write blood pressure results in a log or diary    Why is this important?   You won't feel high blood pressure, but it can still hurt your blood vessels.  High blood pressure can cause heart or kidney problems. It can also cause a stroke.  Making lifestyle changes like losing a little weight or eating less salt will help.  Checking your blood pressure at home and at different times of the day can help to control blood pressure.  If the doctor prescribes medicine remember to take it the way the doctor ordered.  Call the office if you cannot afford the medicine or if there are questions about it.     Notes:        Patient Care Plan: CCM Pharmacy Care Plan     Problem Identified: HLD HTN DMII OA   Priority: High     Long-Range Goal: Disease Management   Start Date: 12/01/2020  Expected End Date: 12/01/2021  Recent Progress: On track  Priority: High  Note:   Current Barriers:  Elevated BP  Interventions: 1:1 collaboration with Vivi Barrack, MD regarding development and update of comprehensive plan of care as evidenced by provider attestation and co-signature Inter-disciplinary care team collaboration (see longitudinal plan of care) Comprehensive medication review performed; medication list updated in electronic medical record No Rx changes  Hypertension (BP goal <140/90) 06/17/22 -Controlled -Current treatment: Irbesartan 300 mg once daily Appropriate, Query effective, ,  Carvedilol 3.125 mg twice daily Appropriate, Query effective, Spironolactone 25  mg once daily Appropriate, Query effective HCTZ '25mg'$  Appropriate, Effective, Safe, Accessible (new start) -Current home readings: at goal -110/71 today at home P 61 -Current dietary habits: some processed foods and carbs - rice, bagels -Current exercise habits: recent knee surgery -Denies hypotensive/hypertensive symptoms -Educated on Symptoms of hypotension and importance of maintaining adequate hydration; -BP much better since the addition of HCTZ.  Counseled to continue to monitor at home. Feeling somewhat lethargic but also has a cold Contact us if this does not improve. Will update BP in chart! Continue same meds.  Update 03/30/21 155/80 this morning, patient was dealing with stressor of switching cellular phones.  Normally controlled at home per pt and has been controlled previously in office.  No changes to medications.  Reports 100% adherence.  Recommend he check BP at least once or twice weekly and reports consistently elevated BP's to myself if < 140/90.  Update 12/06/21 BP was elevated last two in office readings. He has not been checking at home. We discussed the need to monitor at home and record. He agrees to do so at least once or twice weekly.   Call me if consistently > 140/90. Will have CMA FU in 30 days to assess home  readings.  Hyperlipidemia: (LDL goal < 70) 06/17/22 -Controlled, LDL below goal -Current treatment: Atorvastatin 80 mg once daily Appropriate, Effective, Safe, Accessible -Educated on Cholesterol goals;  -Recommended to continue current medication -Was possibly interested in switching to Crestor due to perceived better side effect profile. Will consult with PCP - would consider Crestor '40mg'$  for equivalent dosing.  Diabetes (A1c goal <7%) 06/17/22 -Not ideally controlled, most recent A1c 7.3% -Current medications: Metformin 500 mg XR - two tablets once daily after supper Appropriate, Query effective, ,  -Medications previously tried: IR (did not  tolerate) -Current home glucose readings fasting glucose: not testing post prandial glucose: not testing -Educated on A1c and blood sugar goals; -He is adherent with metformin, not checking glucose at home. -Discussed today to check fasting glucose at least a few times per week.  Aim for FBG 90-130. -Reports he is eating more sweets than he should, counseled to cut back on this and increase exercise. Recheck A1c within 6 months. Could increase metformin if continues to be elevated. CMA to check glucose next month.  Update 03/30/21 Continues to tolerate XR metformin.  Takes all at one time which has improved adherence.  A1c has decreased to 7.0.  He is now more conscious of what he is eating.  Trying to substitute protein shakes with fruit as snacks to fill him up and limit cravings.  Continue current meds.  Update 12/06/21 Continues to take metformin and tolerating. A1c continues to improve. Most recent A1c was 6.8 No changes needed at this time continue routine screenings.              The patient verbalized understanding of instructions, educational materials, and care plan provided today and DECLINED offer to receive copy of patient instructions, educational materials, and care plan.  Telephone follow up appointment with pharmacy team member scheduled for: 4 months  Edythe Clarity, [place], PharmD Clinical Pharmacist  [facility] [phone]

## 2022-06-17 NOTE — Therapy (Signed)
OUTPATIENT PHYSICAL THERAPY TREATMENT NOTE   Patient Name: [name] [name] MRN: [id number] DOB:1951/03/01, 71 y.o., male Today's Date: 06/17/2022  PCP: Vivi Barrack, MD   REFERRING PROVIDER: Lanae Crumbly, PA-C    END OF SESSION:   PT End of Session - 06/17/22 1049     Visit Number 3    Number of Visits 8    Date for PT Re-Evaluation 08/01/22    Authorization Type MCR    PT Start Time 1045    PT Stop Time 1125    PT Time Calculation (min) 40 min    Activity Tolerance Patient tolerated treatment well    Behavior During Therapy [facility] for tasks assessed/performed              Past Medical History:  Diagnosis Date   Adenomatous colon polyp 09/07/2004   Amblyopia    CAD (coronary artery disease)    cardiac CT with a 50% LAD lesion in 2007   Diverticulosis    DM2 (diabetes mellitus, type 2) (HCC)    Elevated LFTs    secondary to labetalol   Esophageal stricture 2003   External hemorrhoids 2012   lanced by Dr Rise Patience   Family history of anesthesia complication    "sister had PONV"   GERD (gastroesophageal reflux disease)    esophageal stricture, pmh   Hiatal hernia 01/04/2000   History of kidney stones 2022   HLD (hyperlipidemia)    HTN (hypertension)    Hypopotassemia    Past Surgical History:  Procedure Laterality Date   BIOPSY  06/05/2021   Procedure: BIOPSY;  Surgeon: Gatha Mayer, MD;  Location: [facility];  Service: Endoscopy;;   BIOPSY  09/02/2021   Procedure: BIOPSY;  Surgeon: [name]., MD;  Location: [place];  Service: Gastroenterology;;   COLONOSCOPY  06/27/2004   tics , ADENOMATOUS polyps. F/U due 2011   COLONOSCOPY  06/27/2010   Tics   COLONOSCOPY WITH PROPOFOL N/A 09/02/2021   Procedure: COLONOSCOPY WITH PROPOFOL;  Surgeon: Rush Landmark Telford Nab., MD;  Location: [place];  Service: Gastroenterology;  Laterality: N/A;   COLONOSCOPY WITH PROPOFOL N/A 04/14/2022   Procedure: COLONOSCOPY WITH PROPOFOL;  Surgeon:  Rush Landmark Telford Nab., MD;  Location: [facility];  Service: Gastroenterology;  Laterality: N/A;   CORONARY ANGIOPLASTY WITH STENT PLACEMENT  04/01/2014   "2"   ENDOSCOPIC MUCOSAL RESECTION  09/02/2021   Procedure: ENDOSCOPIC MUCOSAL RESECTION;  Surgeon: Rush Landmark Telford Nab., MD;  Location: [place];  Service: Gastroenterology;;   ENDOSCOPIC MUCOSAL RESECTION N/A 04/14/2022   Procedure: ENDOSCOPIC MUCOSAL RESECTION;  Surgeon: [name]., MD;  Location: [facility];  Service: Gastroenterology;  Laterality: N/A;   ESOPHAGEAL BRUSHING  04/14/2022   Procedure: ESOPHAGEAL BRUSHING;  Surgeon: [name]., MD;  [facility];  Service: Gastroenterology;;   ESOPHAGEAL DILATION  06/27/2001   ESOPHAGOGASTRODUODENOSCOPY  06/27/2001   ERD, stricture dilated   ESOPHAGOGASTRODUODENOSCOPY (EGD) WITH PROPOFOL N/A 06/05/2021   Procedure: ESOPHAGOGASTRODUODENOSCOPY (EGD) WITH PROPOFOL;  Surgeon: Gatha Mayer, MD;  [facility];  Service: Endoscopy;  Laterality: N/A;   ESOPHAGOGASTRODUODENOSCOPY (EGD) WITH PROPOFOL N/A 09/02/2021   Procedure: ESOPHAGOGASTRODUODENOSCOPY (EGD) WITH PROPOFOL;  Surgeon: Rush Landmark Telford Nab., MD;  Location: [place];  Service: Gastroenterology;  Laterality: N/A;   ESOPHAGOGASTRODUODENOSCOPY (EGD) WITH PROPOFOL N/A 04/14/2022   Procedure: ESOPHAGOGASTRODUODENOSCOPY (EGD) WITH PROPOFOL;  Surgeon: Rush Landmark Telford Nab., MD;  [facility]: [facility];  Service: Gastroenterology;  Laterality: N/A;   EXCISIONAL HEMORRHOIDECTOMY  ~ 2008  HEMOSTASIS CLIP PLACEMENT  09/02/2021   Procedure: HEMOSTASIS CLIP PLACEMENT;  Surgeon: [name]., MD;  Location: [place];  Service: Gastroenterology;;   HEMOSTASIS CONTROL  04/14/2022   Procedure: HEMOSTASIS CONTROL;  Surgeon: [name]., MD;  Location[facility];  Service: Gastroenterology;;   HOT HEMOSTASIS N/A 09/02/2021   Procedure: HOT HEMOSTASIS (ARGON  PLASMA COAGULATION/BICAP);  Surgeon: [name]., MD;  Location: [place];  Service: Gastroenterology;  Laterality: N/A;   HOT HEMOSTASIS N/A 04/14/2022   Procedure: HOT HEMOSTASIS (ARGON PLASMA COAGULATION/BICAP);  Surgeon: [name]., MD;  Location: [facility];  Service: Gastroenterology;  Laterality: N/A;   LEFT HEART CATHETERIZATION WITH CORONARY ANGIOGRAM N/A 03/26/2014   Procedure: LEFT HEART CATHETERIZATION WITH CORONARY ANGIOGRAM;  Surgeon: Peter M Martinique, MD;  Location: [facility];  Service: Cardiovascular;  Laterality: N/A;   PERCUTANEOUS CORONARY STENT INTERVENTION (PCI-S) N/A 04/01/2014   Procedure: PERCUTANEOUS CORONARY STENT INTERVENTION (PCI-S);  Surgeon: Peter M Martinique, MD;  Location: [facility];  Service: Cardiovascular;  Laterality: N/A;   POSTERIOR LAMINECTOMY / DECOMPRESSION LUMBAR SPINE  12/25/2009   L4 , Dr Sherwood Gambler   SHOULDER ARTHROSCOPY WITH ROTATOR CUFF REPAIR AND OPEN BICEPS TENODESIS Left 02/04/2019   Procedure: LEFT SHOULDER ARTHROSCOPY, BICEPS TENODESIS, MINI OPEN ROTATOR CUFF TEAR REPAIR;  Surgeon: Meredith Pel, MD;  Location: [place];  Service: Orthopedics;  Laterality: Left;   SUBMUCOSAL INJECTION  09/02/2021   Procedure: SUBMUCOSAL INJECTION;  Surgeon: Rush Landmark Telford Nab., MD;  Location: [place];  Service: Gastroenterology;;   SUBMUCOSAL LIFTING INJECTION  04/14/2022   Procedure: SUBMUCOSAL LIFTING INJECTION;  Surgeon: [name]., MD;  Location: [facility];  Service: Gastroenterology;;   TOTAL KNEE ARTHROPLASTY Right 05/11/2021   Procedure: TOTAL KNEE ARTHROPLASTY;  Surgeon: Paralee Cancel, MD;  Location: [facility];  Service: Orthopedics;  Laterality: Right;   Patient Active Problem List   Diagnosis Date Noted   IDA (iron deficiency anemia) 08/31/2021   Abnormal colonoscopy 08/29/2021   Tubular adenoma of ascending colon 08/29/2021   Hx of adenomatous colonic polyps 08/29/2021   PUD  (peptic ulcer disease) 08/10/2021   S/P total knee arthroplasty, 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   Nephrolithiasis 03/05/2020   Labral tear of long head of biceps tendon, left, sequela    Complete tear of left rotator cuff    Antiplatelet or antithrombotic long-term use 01/17/2017   Diverticulosis of colon (without mention of hemorrhage) 04/25/2011   Adenomatous colon polyp 03/17/2011   LOW BACK PAIN, CHRONIC 12/13/2007   Hyperlipidemia associated with type 2 diabetes mellitus ([place]) 06/08/2007   T2DM (type 2 diabetes mellitus) ([place]) 02/27/2007   Hypertension associated with diabetes ([place]) 02/27/2007   AMBLYOPIA 12/06/2006   GERD 12/06/2006    REFERRING DIAG: M47.22 (ICD-10-CM) - Other spondylosis with radiculopathy, cervical region   THERAPY DIAG:  Left arm pain   Other spondylosis with radiculopathy, cervical region      Rationale for Evaluation and Treatment Rehabilitation  PERTINENT HISTORY:  1. Left arm pain   2. Other spondylosis with radiculopathy, cervical region       Plan: I am somewhat limited as to what medications I can give patient.  He is unable to take oral NSAIDs due to being on Plavix and I do not want to give a prednisone taper with his history of diabetes.  I did advise him that he can contact his primary care provider to see if they are comfortable prescribing a prednisone taper to see if this helps  relieve his radicular symptoms.  I sent in prescription for Robaxin for spasms.  Will order formal PT for a few weeks.  Follow-up in 3 weeks with Dr. Laurance Flatten for recheck.  If he continues to be symptomatic he will likely need cervical spine MRI throughout HNP/stenosis.   Follow-Up Instructions: Return in about 3 weeks (around 06/16/2022) for WITH DR MOORE RECHECK NECK PAIN AND LEFT UE RADICULOPATHY.     PRECAUTIONS: None  SUBJECTIVE:                                                                                                                                                                                       SUBJECTIVE STATEMENT:  Patient reports his pain is "on and off" and hurts more at night time and when he bends over.    PAIN:  Are you having pain? Yes: NPRS scale: 2-3/10 Pain location: LUE Pain description: ache Aggravating factors: bending forward Relieving factors: rest   OBJECTIVE: (objective measures completed at initial evaluation unless otherwise dated)   DIAGNOSTIC FINDINGS:  none   PATIENT SURVEYS:  FOTO 52(74 predicted)   COGNITION: Overall cognitive status: Within functional limits for tasks assessed   SENSATION: Not tested   POSTURE:  elevated L shoulder   PALPATION: TTP L scalenes, levator scapula and infraspinatus          CERVICAL ROM:    Active ROM A/PROM (deg) eval  Flexion 90%  Extension 50%  Right lateral flexion 25%  Left lateral flexion 25%  Right rotation 75%  Left rotation 75%   (Blank rows = not tested)   UPPER EXTREMITY ROM: WFL   Active ROM Right eval Left eval  Shoulder flexion      Shoulder extension      Shoulder abduction      Shoulder adduction      Shoulder extension      Shoulder internal rotation      Shoulder external rotation      Elbow flexion      Elbow extension      Wrist flexion      Wrist extension      Wrist ulnar deviation      Wrist radial deviation      Wrist pronation      Wrist supination       (Blank rows = not tested)   UPPER EXTREMITY MMT:   MMT Right eval Left eval  Shoulder flexion 5 4+  Shoulder extension 5 4+  Shoulder abduction 5 4+  Shoulder adduction      Shoulder extension      Shoulder internal rotation      Shoulder external  rotation      Middle trapezius      Lower trapezius      Elbow flexion      Elbow extension      Wrist flexion      Wrist extension      Wrist ulnar deviation      Wrist radial deviation      Wrist pronation      Wrist supination      Grip strength 83 66  Pinch key 17 20                                                                 CERVICAL SPECIAL TESTS:  Spurling's test: Negative   TODAY'S TREATMENT:       [facility] Adult PT Treatment:                                                DATE: 06/17/22 Therapeutic Exercise: Nustep L2 8 min Chin tuck over 1/2 roll 3s hold 2x10 Supine flexion w/cane focus on inspiration 15x  L UT stretch 30s x2 L levator stretch 30s x2 Scapular wall slides with pillow case resistance 15x Manual Therapy: Manual scalene stretch 30s hold L ULTT to determine symptoms, only trace symptom reproduction        [facility] Adult PT Treatment:                                                DATE: 06/14/2022 Therapeutic Exercise: UBE level 2 3/3 fwd/bwd Rows GTB 3x10 Shoulder extension GTB 3x10 Pball roll up wall with end range stretch x10 Doorway pec stretch 2x30" Seated horizontal abduction RTB 2x10 Upper trap stretch 2x30" BIL Levator scap stretch 2x30" BIL Manual Therapy: STM BIL upper traps Positional release BIL upper traps                                                                                                                 DATE: Eval and HEP   PATIENT EDUCATION:  Education details: Patient to demonstrate independence in HEP  Person educated: Patient Education method: Explanation Education comprehension: verbalized understanding and needs further education   HOME EXERCISE PROGRAM: Access Code: [email or web address] URL: [email or web address] Date: 06/17/2022 Prepared by: Sharlynn Oliphant  Exercises - Seated Cervical Sidebending Stretch  - 2 x daily - 5 x weekly - 1 sets - 30s hold - Seated Levator Scapulae Stretch on Wall  - 2 x daily - 5 x weekly - 1 sets - 3 reps - 30s hold - Scapular Wall  Slides  - 2 x daily - 5 x weekly - 1 sets - 15 reps ASSESSMENT:   CLINICAL IMPRESSION: Less discomfort in LUE, still describes paresthesias in L 4-5th digits.  Focus today was postural exercises, stretching of scalenes as well as tasks to promote flexibility and rib  excursion.  Able to reproduce symptoms with scalene stretch and STM, ULTT inconclusive, wall slides were also able to reproduce symptoms.     OBJECTIVE IMPAIRMENTS: decreased activity tolerance, decreased knowledge of condition, decreased mobility, decreased ROM, decreased strength, increased fascial restrictions, increased muscle spasms, impaired UE functional use, postural dysfunction, and pain.    ACTIVITY LIMITATIONS: carrying, lifting, bending, and reach over head   PERSONAL FACTORS: Age, Fitness, and Time since onset of injury/illness/exacerbation are also affecting patient's functional outcome.    REHAB POTENTIAL: Good   CLINICAL DECISION MAKING: Stable/uncomplicated   EVALUATION COMPLEXITY: Low     GOALS: Goals reviewed with patient? No   SHORT TERM GOALS: Target date: 06/20/2022     Patient to demonstrate independence in HEP  Baseline: [id number] Goal status: INITIAL   2.  Increase B SB to 50% Baseline: 25% B Goal status: INITIAL     LONG TERM GOALS: Target date: 08/15/2022     Increase FOTO score to 74 Baseline: 52 Goal status: INITIAL   2.  Decrease pain to 2/10 Baseline: 4/10 Goal status: INITIAL   3.  Patient to bend forward to tie shoes w/o pain/symptoms Baseline: LUE symptoms when tying shoes Goal status: INITIAL     PLAN:   PT FREQUENCY: 2x/week   PT DURATION: 4 weeks   PLANNED INTERVENTIONS: Therapeutic exercises, Therapeutic activity, Neuromuscular re-education, Balance training, Gait training, Patient/Family education, Self Care, Joint mobilization, Dry Needling, Manual therapy, and Re-evaluation   PLAN FOR NEXT SESSION: HEP review and update, ROM and stretching postural training, manual techniques as indicated   Lanice Shirts, PT 06/17/2022, 11:42 AM

## 2022-06-22 ENCOUNTER — Ambulatory Visit: Payer: Medicare Other

## 2022-06-22 DIAGNOSIS — M542 Cervicalgia: Secondary | ICD-10-CM | POA: Diagnosis not present

## 2022-06-22 DIAGNOSIS — M4722 Other spondylosis with radiculopathy, cervical region: Secondary | ICD-10-CM | POA: Diagnosis not present

## 2022-06-22 DIAGNOSIS — M5412 Radiculopathy, cervical region: Secondary | ICD-10-CM | POA: Diagnosis not present

## 2022-06-22 NOTE — Therapy (Signed)
OUTPATIENT PHYSICAL THERAPY TREATMENT NOTE   Patient Name: [name] [name] MRN: [id number] DOB:[birth year], 71 y.o., male Today's Date: 06/22/2022  PCP: Vivi Barrack, MD   REFERRING PROVIDER: Lanae Crumbly, PA-C    END OF SESSION:   PT End of Session - 06/22/22 0950     Visit Number 4    Number of Visits 8    Date for PT Re-Evaluation 08/01/22    Authorization Type MCR    PT Start Time 1000    PT Stop Time 1040    PT Time Calculation (min) 40 min    Activity Tolerance Patient tolerated treatment well    Behavior During Therapy [facility] for tasks assessed/performed              Past Medical History:  Diagnosis Date   Adenomatous colon polyp 09/07/2004   Amblyopia    CAD (coronary artery disease)    cardiac CT with a 50% LAD lesion in 2007   Diverticulosis    DM2 (diabetes mellitus, type 2) (HCC)    Elevated LFTs    secondary to labetalol   Esophageal stricture 2003   External hemorrhoids 2012   lanced by Dr Rise Patience   Family history of anesthesia complication    "sister had PONV"   GERD (gastroesophageal reflux disease)    esophageal stricture, pmh   Hiatal hernia 01/04/2000   History of kidney stones 2022   HLD (hyperlipidemia)    HTN (hypertension)    Hypopotassemia    Past Surgical History:  Procedure Laterality Date   BIOPSY  06/05/2021   Procedure: BIOPSY;  Surgeon: Gatha Mayer, MD;  Location: [facility];  Service: Endoscopy;;   BIOPSY  09/02/2021   Procedure: BIOPSY;  Surgeon: [name]., MD;  Location: [place];  Service: Gastroenterology;;   COLONOSCOPY  06/27/2004   tics , ADENOMATOUS polyps. F/U due 2011   COLONOSCOPY  06/27/2010   Tics   COLONOSCOPY WITH PROPOFOL N/A 09/02/2021   Procedure: COLONOSCOPY WITH PROPOFOL;  Surgeon: Rush Landmark Telford Nab., MD;  Location: [place];  Service: Gastroenterology;  Laterality: N/A;   COLONOSCOPY WITH PROPOFOL N/A 04/14/2022   Procedure: COLONOSCOPY WITH PROPOFOL;  Surgeon:  Rush Landmark Telford Nab., MD;  Location: [facility];  Service: Gastroenterology;  Laterality: N/A;   CORONARY ANGIOPLASTY WITH STENT PLACEMENT  04/01/2014   "2"   ENDOSCOPIC MUCOSAL RESECTION  09/02/2021   Procedure: ENDOSCOPIC MUCOSAL RESECTION;  Surgeon: Rush Landmark Telford Nab., MD;  Location: [place];  Service: Gastroenterology;;   ENDOSCOPIC MUCOSAL RESECTION N/A 04/14/2022   Procedure: ENDOSCOPIC MUCOSAL RESECTION;  Surgeon: [name]., MD;  Location: [facility];  Service: Gastroenterology;  Laterality: N/A;   ESOPHAGEAL BRUSHING  04/14/2022   Procedure: ESOPHAGEAL BRUSHING;  Surgeon: [name]., MD;  [facility]: [facility];  Service: Gastroenterology;;   ESOPHAGEAL DILATION  06/27/2001   ESOPHAGOGASTRODUODENOSCOPY  06/27/2001   ERD, stricture dilated   ESOPHAGOGASTRODUODENOSCOPY (EGD) WITH PROPOFOL N/A 06/05/2021   Procedure: ESOPHAGOGASTRODUODENOSCOPY (EGD) WITH PROPOFOL;  Surgeon: Gatha Mayer, MD;  [facility];  Service: Endoscopy;  Laterality: N/A;   ESOPHAGOGASTRODUODENOSCOPY (EGD) WITH PROPOFOL N/A 09/02/2021   Procedure: ESOPHAGOGASTRODUODENOSCOPY (EGD) WITH PROPOFOL;  Surgeon: Rush Landmark Telford Nab., MD;  Location: [place];  Service: Gastroenterology;  Laterality: N/A;   ESOPHAGOGASTRODUODENOSCOPY (EGD) WITH PROPOFOL N/A 04/14/2022   Procedure: ESOPHAGOGASTRODUODENOSCOPY (EGD) WITH PROPOFOL;  Surgeon: Rush Landmark Telford Nab., MD;  [facility];  Service: Gastroenterology;  Laterality: N/A;   EXCISIONAL HEMORRHOIDECTOMY  ~ 2008  HEMOSTASIS CLIP PLACEMENT  09/02/2021   Procedure: HEMOSTASIS CLIP PLACEMENT;  Surgeon: [name]., MD;  Location: [place];  Service: Gastroenterology;;   HEMOSTASIS CONTROL  04/14/2022   Procedure: HEMOSTASIS CONTROL;  Surgeon: [name]., MD;  Location: [facility];  Service: Gastroenterology;;   HOT HEMOSTASIS N/A 09/02/2021   Procedure: HOT HEMOSTASIS (ARGON  PLASMA COAGULATION/BICAP);  Surgeon: [name]., MD;  Location: [place];  Service: Gastroenterology;  Laterality: N/A;   HOT HEMOSTASIS N/A 04/14/2022   Procedure: HOT HEMOSTASIS (ARGON PLASMA COAGULATION/BICAP);  Surgeon: [name]., MD;  Location: [facility];  Service: Gastroenterology;  Laterality: N/A;   LEFT HEART CATHETERIZATION WITH CORONARY ANGIOGRAM N/A 03/26/2014   Procedure: LEFT HEART CATHETERIZATION WITH CORONARY ANGIOGRAM;  Surgeon: Peter M Martinique, MD;  Location: [facility];  Service: Cardiovascular;  Laterality: N/A;   PERCUTANEOUS CORONARY STENT INTERVENTION (PCI-S) N/A 04/01/2014   Procedure: PERCUTANEOUS CORONARY STENT INTERVENTION (PCI-S);  Surgeon: Peter M Martinique, MD;  Location: [facility];  Service: Cardiovascular;  Laterality: N/A;   POSTERIOR LAMINECTOMY / DECOMPRESSION LUMBAR SPINE  12/25/2009   L4 , Dr Sherwood Gambler   SHOULDER ARTHROSCOPY WITH ROTATOR CUFF REPAIR AND OPEN BICEPS TENODESIS Left 02/04/2019   Procedure: LEFT SHOULDER ARTHROSCOPY, BICEPS TENODESIS, MINI OPEN ROTATOR CUFF TEAR REPAIR;  Surgeon: Meredith Pel, MD;  Location: [place];  Service: Orthopedics;  Laterality: Left;   SUBMUCOSAL INJECTION  09/02/2021   Procedure: SUBMUCOSAL INJECTION;  Surgeon: Rush Landmark Telford Nab., MD;  Location: [place];  Service: Gastroenterology;;   SUBMUCOSAL LIFTING INJECTION  04/14/2022   Procedure: SUBMUCOSAL LIFTING INJECTION;  Surgeon: [name]., MD;  Location: [facility];  Service: Gastroenterology;;   TOTAL KNEE ARTHROPLASTY Right 05/11/2021   Procedure: TOTAL KNEE ARTHROPLASTY;  Surgeon: Paralee Cancel, MD;  Location: [facility];  Service: Orthopedics;  Laterality: Right;   Patient Active Problem List   Diagnosis Date Noted   IDA (iron deficiency anemia) 08/31/2021   Abnormal colonoscopy 08/29/2021   Tubular adenoma of ascending colon 08/29/2021   Hx of adenomatous colonic polyps 08/29/2021   PUD  (peptic ulcer disease) 08/10/2021   S/P total knee arthroplasty, 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   Nephrolithiasis 03/05/2020   Labral tear of long head of biceps tendon, left, sequela    Complete tear of left rotator cuff    Antiplatelet or antithrombotic long-term use 01/17/2017   Diverticulosis of colon (without mention of hemorrhage) 04/25/2011   Adenomatous colon polyp 03/17/2011   LOW BACK PAIN, CHRONIC 12/13/2007   Hyperlipidemia associated with type 2 diabetes mellitus ([place]) 06/08/2007   T2DM (type 2 diabetes mellitus) ([place]) 02/27/2007   Hypertension associated with diabetes ([place]) 02/27/2007   AMBLYOPIA 12/06/2006   GERD 12/06/2006    REFERRING DIAG: M47.22 (ICD-10-CM) - Other spondylosis with radiculopathy, cervical region   THERAPY DIAG:  Left arm pain   Other spondylosis with radiculopathy, cervical region      Rationale for Evaluation and Treatment Rehabilitation  PERTINENT HISTORY:  1. Left arm pain   2. Other spondylosis with radiculopathy, cervical region       Plan: I am somewhat limited as to what medications I can give patient.  He is unable to take oral NSAIDs due to being on Plavix and I do not want to give a prednisone taper with his history of diabetes.  I did advise him that he can contact his primary care provider to see if they are comfortable prescribing a prednisone taper to see if this helps  relieve his radicular symptoms.  I sent in prescription for Robaxin for spasms.  Will order formal PT for a few weeks.  Follow-up in 3 weeks with Dr. Laurance Flatten for recheck.  If he continues to be symptomatic he will likely need cervical spine MRI throughout HNP/stenosis.   Follow-Up Instructions: Return in about 3 weeks (around 06/16/2022) for WITH DR MOORE RECHECK NECK PAIN AND LEFT UE RADICULOPATHY.     PRECAUTIONS: None  SUBJECTIVE:                                                                                                                                                                                       SUBJECTIVE STATEMENT:  Reports symptoms "not as bad"   PAIN:  Are you having pain? Yes: NPRS scale: 2-3/10 Pain location: LUE Pain description: ache Aggravating factors: bending forward Relieving factors: rest   OBJECTIVE: (objective measures completed at initial evaluation unless otherwise dated)   DIAGNOSTIC FINDINGS:  none   PATIENT SURVEYS:  FOTO 52(74 predicted)   COGNITION: Overall cognitive status: Within functional limits for tasks assessed   SENSATION: Not tested   POSTURE:  elevated L shoulder   PALPATION: TTP L scalenes, levator scapula and infraspinatus          CERVICAL ROM:    Active ROM A/PROM (deg) eval  Flexion 90%  Extension 50%  Right lateral flexion 25%  Left lateral flexion 25%  Right rotation 75%  Left rotation 75%   (Blank rows = not tested)   UPPER EXTREMITY ROM: WFL   Active ROM Right eval Left eval  Shoulder flexion      Shoulder extension      Shoulder abduction      Shoulder adduction      Shoulder extension      Shoulder internal rotation      Shoulder external rotation      Elbow flexion      Elbow extension      Wrist flexion      Wrist extension      Wrist ulnar deviation      Wrist radial deviation      Wrist pronation      Wrist supination       (Blank rows = not tested)   UPPER EXTREMITY MMT:   MMT Right eval Left eval  Shoulder flexion 5 4+  Shoulder extension 5 4+  Shoulder abduction 5 4+  Shoulder adduction      Shoulder extension      Shoulder internal rotation      Shoulder external rotation      Middle trapezius      Lower trapezius  Elbow flexion      Elbow extension      Wrist flexion      Wrist extension      Wrist ulnar deviation      Wrist radial deviation      Wrist pronation      Wrist supination      Grip strength 83 66  Pinch key 17 20                                                                CERVICAL SPECIAL TESTS:  Spurling's test:  Negative   TODAY'S TREATMENT:       [facility] Adult PT Treatment:                                                DATE: 06/22/22 Therapeutic Exercise: Nustep L4 8 min Chin tuck over 1/2 roll 3s hold 2x10 Supine flexion w/cane focus on inspiration 15x 2# Supine alternating shoulder flexion 1# 15/15  L UT stretch 30s x2 L levator stretch 30s x2 Scapular wall slides with YTB resistance 15x Corner stretch 30s x3 Manual Therapy: L pec minor STM Manual scalene stretch 30s hold First rib mob L 10xx followed by MWM of shoulder flexion 10x   [facility] Adult PT Treatment:                                                DATE: 06/17/22 Therapeutic Exercise: Nustep L2 8 min Chin tuck over 1/2 roll 3s hold 2x10 Supine flexion w/cane focus on inspiration 15x  L UT stretch 30s x2 L levator stretch 30s x2 Scapular wall slides with pillow case resistance 15x Manual Therapy: Manual scalene stretch 30s hold L ULTT to determine symptoms, only trace symptom reproduction        [facility] Adult PT Treatment:                                                DATE: 06/14/2022 Therapeutic Exercise: UBE level 2 3/3 fwd/bwd Rows GTB 3x10 Shoulder extension GTB 3x10 Pball roll up wall with end range stretch x10 Doorway pec stretch 2x30" Seated horizontal abduction RTB 2x10 Upper trap stretch 2x30" BIL Levator scap stretch 2x30" BIL Manual Therapy: STM BIL upper traps Positional release BIL upper traps  DATE: Eval and HEP   PATIENT EDUCATION:  Education details: Patient to demonstrate independence in HEP  Person educated: Patient Education method: Explanation Education comprehension: verbalized understanding and needs further education   HOME EXERCISE PROGRAM: Access Code: [email or web address] URL: [email or web address] Date: 06/17/2022 Prepared by: Sharlynn Oliphant  Exercises - Seated Cervical  Sidebending Stretch  - 2 x daily - 5 x weekly - 1 sets - 30s hold - Seated Levator Scapulae Stretch on Wall  - 2 x daily - 5 x weekly - 1 sets - 3 reps - 30s hold - Scapular Wall Slides  - 2 x daily - 5 x weekly - 1 sets - 15 reps ASSESSMENT:   CLINICAL IMPRESSION:Symptoms slowly improving, HEP reviewed as patient was overstretching ans causing contralateral discomfort as he was compressing c-spine.  Continued postural training and strengthening as well as stretching of cervicothoracic region.  increased resistance on nustep. Added first rib L mobilizations and STM to reduce symptoms.     OBJECTIVE IMPAIRMENTS: decreased activity tolerance, decreased knowledge of condition, decreased mobility, decreased ROM, decreased strength, increased fascial restrictions, increased muscle spasms, impaired UE functional use, postural dysfunction, and pain.    ACTIVITY LIMITATIONS: carrying, lifting, bending, and reach over head   PERSONAL FACTORS: Age, Fitness, and Time since onset of injury/illness/exacerbation are also affecting patient's functional outcome.    REHAB POTENTIAL: Good   CLINICAL DECISION MAKING: Stable/uncomplicated   EVALUATION COMPLEXITY: Low     GOALS: Goals reviewed with patient? [name]   SHORT TERM GOALS: Target date: 06/20/2022     Patient to demonstrate independence in HEP  Baseline: [id number] Goal status: Met   2.  Increase B SB to 50% Baseline: 25% B Goal status: Ongoing     LONG TERM GOALS: Target date: 08/15/2022     Increase FOTO score to 74 Baseline: 52 Goal status: INITIAL   2.  Decrease pain to 2/10 Baseline: 4/10 Goal status: INITIAL   3.  Patient to bend forward to tie shoes w/o pain/symptoms Baseline: LUE symptoms when tying shoes Goal status: INITIAL     PLAN:   PT FREQUENCY: 2x/week   PT DURATION: 4 weeks   PLANNED INTERVENTIONS: Therapeutic exercises, Therapeutic activity, Neuromuscular re-education, Balance training, Gait training,  Patient/Family education, Self Care, Joint mobilization, Dry Needling, Manual therapy, and Re-evaluation   PLAN FOR NEXT SESSION: HEP review and update, ROM and stretching postural training, manual techniques as indicated   Lanice Shirts, PT 06/22/2022, 10:47 AM

## 2022-06-23 ENCOUNTER — Other Ambulatory Visit: Payer: Self-pay | Admitting: Family Medicine

## 2022-06-24 ENCOUNTER — Ambulatory Visit: Payer: Medicare Other

## 2022-06-24 DIAGNOSIS — M4722 Other spondylosis with radiculopathy, cervical region: Secondary | ICD-10-CM | POA: Diagnosis not present

## 2022-06-24 DIAGNOSIS — M5412 Radiculopathy, cervical region: Secondary | ICD-10-CM

## 2022-06-24 DIAGNOSIS — M542 Cervicalgia: Secondary | ICD-10-CM | POA: Diagnosis not present

## 2022-06-24 NOTE — Therapy (Signed)
OUTPATIENT PHYSICAL THERAPY TREATMENT NOTE   Patient Name: [name] [name] MRN: [id number] DOB:October 15, 1950, 71 y.o., male Today's Date: 06/24/2022  PCP: Vivi Barrack, MD   REFERRING PROVIDER: Lanae Crumbly, PA-C    END OF SESSION:   PT End of Session - 06/24/22 0913     Visit Number 5    Number of Visits 8    Date for PT Re-Evaluation 08/01/22    Authorization Type MCR    PT Start Time 0915    PT Stop Time 0955    PT Time Calculation (min) 40 min    Activity Tolerance Patient tolerated treatment well    Behavior During Therapy [facility] for tasks assessed/performed              Past Medical History:  Diagnosis Date   Adenomatous colon polyp 09/07/2004   Amblyopia    CAD (coronary artery disease)    cardiac CT with a 50% LAD lesion in 2007   Diverticulosis    DM2 (diabetes mellitus, type 2) (HCC)    Elevated LFTs    secondary to labetalol   Esophageal stricture 2003   External hemorrhoids 2012   lanced by Dr Rise Patience   Family history of anesthesia complication    "sister had PONV"   GERD (gastroesophageal reflux disease)    esophageal stricture, pmh   Hiatal hernia 01/04/2000   History of kidney stones 2022   HLD (hyperlipidemia)    HTN (hypertension)    Hypopotassemia    Past Surgical History:  Procedure Laterality Date   BIOPSY  06/05/2021   Procedure: BIOPSY;  Surgeon: Gatha Mayer, MD;  Location: [facility];  Service: Endoscopy;;   BIOPSY  09/02/2021   Procedure: BIOPSY;  Surgeon: [name]., MD;  Location: [place];  Service: Gastroenterology;;   COLONOSCOPY  06/27/2004   tics , ADENOMATOUS polyps. F/U due 2011   COLONOSCOPY  06/27/2010   Tics   COLONOSCOPY WITH PROPOFOL N/A 09/02/2021   Procedure: COLONOSCOPY WITH PROPOFOL;  Surgeon: Rush Landmark Telford Nab., MD;  Location: [place];  Service: Gastroenterology;  Laterality: N/A;   COLONOSCOPY WITH PROPOFOL N/A 04/14/2022   Procedure: COLONOSCOPY WITH PROPOFOL;  Surgeon:  Rush Landmark Telford Nab., MD;  Location: [facility];  Service: Gastroenterology;  Laterality: N/A;   CORONARY ANGIOPLASTY WITH STENT PLACEMENT  04/01/2014   "2"   ENDOSCOPIC MUCOSAL RESECTION  09/02/2021   Procedure: ENDOSCOPIC MUCOSAL RESECTION;  Surgeon: Rush Landmark Telford Nab., MD;  Location: [place];  Service: Gastroenterology;;   ENDOSCOPIC MUCOSAL RESECTION N/A 04/14/2022   Procedure: ENDOSCOPIC MUCOSAL RESECTION;  Surgeon: [name]., MD;  Location: [facility];  Service: Gastroenterology;  Laterality: N/A;   ESOPHAGEAL BRUSHING  04/14/2022   Procedure: ESOPHAGEAL BRUSHING;  Surgeon: [name]., MD;  [facility];  Service: Gastroenterology;;   ESOPHAGEAL DILATION  06/27/2001   ESOPHAGOGASTRODUODENOSCOPY  06/27/2001   ERD, stricture dilated   ESOPHAGOGASTRODUODENOSCOPY (EGD) WITH PROPOFOL N/A 06/05/2021   Procedure: ESOPHAGOGASTRODUODENOSCOPY (EGD) WITH PROPOFOL;  Surgeon: Gatha Mayer, MD;  [facility];  Service: Endoscopy;  Laterality: N/A;   ESOPHAGOGASTRODUODENOSCOPY (EGD) WITH PROPOFOL N/A 09/02/2021   Procedure: ESOPHAGOGASTRODUODENOSCOPY (EGD) WITH PROPOFOL;  Surgeon: Rush Landmark Telford Nab., MD;  Location: [place];  Service: Gastroenterology;  Laterality: N/A;   ESOPHAGOGASTRODUODENOSCOPY (EGD) WITH PROPOFOL N/A 04/14/2022   Procedure: ESOPHAGOGASTRODUODENOSCOPY (EGD) WITH PROPOFOL;  Surgeon: Rush Landmark Telford Nab., MD;  [facility];  Service: Gastroenterology;  Laterality: N/A;   EXCISIONAL HEMORRHOIDECTOMY  ~ 2008  HEMOSTASIS CLIP PLACEMENT  09/02/2021   Procedure: HEMOSTASIS CLIP PLACEMENT;  Surgeon: [name]., MD;  Location: [place];  Service: Gastroenterology;;   HEMOSTASIS CONTROL  04/14/2022   Procedure: HEMOSTASIS CONTROL;  Surgeon: [name]., MD;  Location[facility];  Service: Gastroenterology;;   HOT HEMOSTASIS N/A 09/02/2021   Procedure: HOT HEMOSTASIS (ARGON  PLASMA COAGULATION/BICAP);  Surgeon: [name]., MD;  Location: [place];  Service: Gastroenterology;  Laterality: N/A;   HOT HEMOSTASIS N/A 04/14/2022   Procedure: HOT HEMOSTASIS (ARGON PLASMA COAGULATION/BICAP);  Surgeon: [name]., MD;  Location: [facility];  Service: Gastroenterology;  Laterality: N/A;   LEFT HEART CATHETERIZATION WITH CORONARY ANGIOGRAM N/A 03/26/2014   Procedure: LEFT HEART CATHETERIZATION WITH CORONARY ANGIOGRAM;  Surgeon: Peter M Martinique, MD;  Location: [facility];  Service: Cardiovascular;  Laterality: N/A;   PERCUTANEOUS CORONARY STENT INTERVENTION (PCI-S) N/A 04/01/2014   Procedure: PERCUTANEOUS CORONARY STENT INTERVENTION (PCI-S);  Surgeon: Peter M Martinique, MD;  Location: [facility];  Service: Cardiovascular;  Laterality: N/A;   POSTERIOR LAMINECTOMY / DECOMPRESSION LUMBAR SPINE  12/25/2009   L4 , Dr Sherwood Gambler   SHOULDER ARTHROSCOPY WITH ROTATOR CUFF REPAIR AND OPEN BICEPS TENODESIS Left 02/04/2019   Procedure: LEFT SHOULDER ARTHROSCOPY, BICEPS TENODESIS, MINI OPEN ROTATOR CUFF TEAR REPAIR;  Surgeon: Meredith Pel, MD;  Location: [place];  Service: Orthopedics;  Laterality: Left;   SUBMUCOSAL INJECTION  09/02/2021   Procedure: SUBMUCOSAL INJECTION;  Surgeon: Rush Landmark Telford Nab., MD;  Location: [place];  Service: Gastroenterology;;   SUBMUCOSAL LIFTING INJECTION  04/14/2022   Procedure: SUBMUCOSAL LIFTING INJECTION;  Surgeon: [name]., MD;  Location: [facility];  Service: Gastroenterology;;   TOTAL KNEE ARTHROPLASTY Right 05/11/2021   Procedure: TOTAL KNEE ARTHROPLASTY;  Surgeon: Paralee Cancel, MD;  Location: [facility];  Service: Orthopedics;  Laterality: Right;   Patient Active Problem List   Diagnosis Date Noted   IDA (iron deficiency anemia) 08/31/2021   Abnormal colonoscopy 08/29/2021   Tubular adenoma of ascending colon 08/29/2021   Hx of adenomatous colonic polyps 08/29/2021   PUD  (peptic ulcer disease) 08/10/2021   S/P total knee arthroplasty, 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   Nephrolithiasis 03/05/2020   Labral tear of long head of biceps tendon, left, sequela    Complete tear of left rotator cuff    Antiplatelet or antithrombotic long-term use 01/17/2017   Diverticulosis of colon (without mention of hemorrhage) 04/25/2011   Adenomatous colon polyp 03/17/2011   LOW BACK PAIN, CHRONIC 12/13/2007   Hyperlipidemia associated with type 2 diabetes mellitus ([place]) 06/08/2007   T2DM (type 2 diabetes mellitus) ([place]) 02/27/2007   Hypertension associated with diabetes ([place]) 02/27/2007   AMBLYOPIA 12/06/2006   GERD 12/06/2006    REFERRING DIAG: M47.22 (ICD-10-CM) - Other spondylosis with radiculopathy, cervical region   THERAPY DIAG:  Left arm pain   Other spondylosis with radiculopathy, cervical region      Rationale for Evaluation and Treatment Rehabilitation  PERTINENT HISTORY:  1. Left arm pain   2. Other spondylosis with radiculopathy, cervical region       Plan: I am somewhat limited as to what medications I can give patient.  He is unable to take oral NSAIDs due to being on Plavix and I do not want to give a prednisone taper with his history of diabetes.  I did advise him that he can contact his primary care provider to see if they are comfortable prescribing a prednisone taper to see if this helps  relieve his radicular symptoms.  I sent in prescription for Robaxin for spasms.  Will order formal PT for a few weeks.  Follow-up in 3 weeks with Dr. Laurance Flatten for recheck.  If he continues to be symptomatic he will likely need cervical spine MRI throughout HNP/stenosis.   Follow-Up Instructions: Return in about 3 weeks (around 06/16/2022) for WITH DR MOORE RECHECK NECK PAIN AND LEFT UE RADICULOPATHY.     PRECAUTIONS: None  SUBJECTIVE:                                                                                                                                                                                       SUBJECTIVE STATEMENT:  Reports a 60% improvement in symptoms during the night    PAIN:  Are you having pain? Yes: NPRS scale: 2-3/10 Pain location: LUE Pain description: ache Aggravating factors: bending forward Relieving factors: rest   OBJECTIVE: (objective measures completed at initial evaluation unless otherwise dated)   DIAGNOSTIC FINDINGS:  none   PATIENT SURVEYS:  FOTO 52(74 predicted)   COGNITION: Overall cognitive status: Within functional limits for tasks assessed   SENSATION: Not tested   POSTURE:  elevated L shoulder   PALPATION: TTP L scalenes, levator scapula and infraspinatus          CERVICAL ROM:    Active ROM A/PROM (deg) eval 06/24/22  Flexion 90% 90%  Extension 50% 75%  Right lateral flexion 25% 50%  Left lateral flexion 25% 50%  Right rotation 75% 75%  Left rotation 75% 75%   (Blank rows = not tested)   UPPER EXTREMITY ROM: WFL   Active ROM Right eval Left eval  Shoulder flexion      Shoulder extension      Shoulder abduction      Shoulder adduction      Shoulder extension      Shoulder internal rotation      Shoulder external rotation      Elbow flexion      Elbow extension      Wrist flexion      Wrist extension      Wrist ulnar deviation      Wrist radial deviation      Wrist pronation      Wrist supination       (Blank rows = not tested)   UPPER EXTREMITY MMT:   MMT Right eval Left eval  Shoulder flexion 5 4+  Shoulder extension 5 4+  Shoulder abduction 5 4+  Shoulder adduction      Shoulder extension      Shoulder internal rotation      Shoulder external rotation  Middle trapezius      Lower trapezius      Elbow flexion      Elbow extension      Wrist flexion      Wrist extension      Wrist ulnar deviation      Wrist radial deviation      Wrist pronation      Wrist supination      Grip strength 83 66  Pinch key 17 20                                                                 CERVICAL SPECIAL TESTS:  Spurling's test: Negative   TODAY'S TREATMENT:       [facility] Adult PT Treatment:                                                DATE: 06/24/22 Therapeutic Exercise: Nustep L4 8 min Chin tuck over 1/2 roll 3s hold 2x10 Supine flexion w/cane focus on inspiration 15x 3# Supine alternating shoulder flexion 1# 15/15  Supine hor abd YTB 15x Prone L shoulder extension 1# 15x Prone L shoulder flexion 1# 15x Prone L hor abduction IR/ER 1# 10/10 Scapular wall slides with YTB resistance 15x Corner stretch 30s x3 Manual Therapy: 4 way scapula 10x   [facility] Adult PT Treatment:                                                DATE: 06/22/22 Therapeutic Exercise: Nustep L4 8 min Chin tuck over 1/2 roll 3s hold 2x10 Supine flexion w/cane focus on inspiration 15x 2# Supine alternating shoulder flexion 1# 15/15  L UT stretch 30s x2 L levator stretch 30s x2 Scapular wall slides with YTB resistance 15x Corner stretch 30s x3 Manual Therapy: L pec minor STM Manual scalene stretch 30s hold First rib mob L 10xx followed by MWM of shoulder flexion 10x   [facility] Adult PT Treatment:                                                DATE: 06/17/22 Therapeutic Exercise: Nustep L2 8 min Chin tuck over 1/2 roll 3s hold 2x10 Supine flexion w/cane focus on inspiration 15x  L UT stretch 30s x2 L levator stretch 30s x2 Scapular wall slides with pillow case resistance 15x Manual Therapy: Manual scalene stretch 30s hold L ULTT to determine symptoms, only trace symptom reproduction        [facility] Adult PT Treatment:                                                DATE: 06/14/2022 Therapeutic Exercise: UBE level 2 3/3 fwd/bwd Rows GTB 3x10 Shoulder extension GTB 3x10 Pball roll  up wall with end range stretch x10 Doorway pec stretch 2x30" Seated horizontal abduction RTB 2x10 Upper trap stretch 2x30" BIL Levator scap stretch 2x30" BIL Manual Therapy: STM BIL upper  traps Positional release BIL upper traps                                                                                                                 DATE: Eval and HEP   PATIENT EDUCATION:  Education details: Patient to demonstrate independence in HEP  Person educated: Patient Education method: Explanation Education comprehension: verbalized understanding and needs further education   HOME EXERCISE PROGRAM: Access Code: [email or web address] URL: [email or web address] Date: 06/24/2022 Prepared by: Sharlynn Oliphant  Exercises - Seated Cervical Sidebending Stretch  - 2 x daily - 5 x weekly - 1 sets - 30s hold - Seated Levator Scapulae Stretch on Wall  - 2 x daily - 5 x weekly - 1 sets - 3 reps - 30s hold - Scapular Wall Slides  - 2 x daily - 5 x weekly - 1 sets - 15 reps - Corner Pec Major Stretch  - 2 x daily - 5 x weekly - 1 sets - 3 reps - 30s hold  ASSESSMENT:   CLINICAL IMPRESSION: ROM gains noted, symptoms decreasing per patient report.  Continued to address forward head posture and added additional posterior L shoulder strengthening.  Added corner stretch to HEP.     OBJECTIVE IMPAIRMENTS: decreased activity tolerance, decreased knowledge of condition, decreased mobility, decreased ROM, decreased strength, increased fascial restrictions, increased muscle spasms, impaired UE functional use, postural dysfunction, and pain.    ACTIVITY LIMITATIONS: carrying, lifting, bending, and reach over head   PERSONAL FACTORS: Age, Fitness, and Time since onset of injury/illness/exacerbation are also affecting patient's functional outcome.    REHAB POTENTIAL: Good   CLINICAL DECISION MAKING: Stable/uncomplicated   EVALUATION COMPLEXITY: Low     GOALS: Goals reviewed with patient? No   SHORT TERM GOALS: Target date: 06/20/2022     Patient to demonstrate independence in HEP  Baseline: [id number] Goal status: Met   2.  Increase B SB to 50% Baseline:  Active ROM A/PROM  (deg) eval 06/24/22  Flexion 90% 90%  Extension 50% 75%  Right lateral flexion 25% 50%  Left lateral flexion 25% 50%  Right rotation 75% 75%  Left rotation 75% 75%   Goal status: Met     LONG TERM GOALS: Target date: 08/15/2022     Increase FOTO score to 74 Baseline: 52 Goal status: INITIAL   2.  Decrease pain to 2/10 Baseline: 4/10 Goal status: INITIAL   3.  Patient to bend forward to tie shoes w/o pain/symptoms Baseline: LUE symptoms when tying shoes Goal status: INITIAL     PLAN:   PT FREQUENCY: 2x/week   PT DURATION: 4 weeks   PLANNED INTERVENTIONS: Therapeutic exercises, Therapeutic activity, Neuromuscular re-education, Balance training, Gait training, Patient/Family education, Self Care, Joint mobilization, Dry Needling, Manual therapy, and Re-evaluation   PLAN FOR NEXT SESSION: HEP review and update, ROM  and stretching postural training, manual techniques as indicated   Lanice Shirts, PT 06/24/2022, 9:59 AM

## 2022-06-29 ENCOUNTER — Ambulatory Visit: Payer: Medicare Other

## 2022-06-29 ENCOUNTER — Telehealth: Payer: Self-pay | Admitting: *Deleted

## 2022-06-29 ENCOUNTER — Ambulatory Visit: Payer: Medicare Other | Attending: Surgery

## 2022-06-29 DIAGNOSIS — M5412 Radiculopathy, cervical region: Secondary | ICD-10-CM | POA: Diagnosis not present

## 2022-06-29 DIAGNOSIS — M542 Cervicalgia: Secondary | ICD-10-CM | POA: Diagnosis not present

## 2022-06-29 NOTE — Patient Outreach (Signed)
  Care Coordination   06/29/2022 Name: ANDRIY SHERK MRN: [id number] DOB: Oct 06, 1950   Care Coordination Outreach Attempts:  An unsuccessful telephone outreach was attempted today to offer the patient information about available care coordination services as a benefit of their health plan.   Follow Up Plan:  Additional outreach attempts will be made to offer the patient care coordination information and services.   Encounter Outcome:  No Answer   Care Coordination Interventions:  No, not indicated    Raina Mina, RN Care Management Coordinator [place] Office [phone]

## 2022-06-29 NOTE — Therapy (Signed)
OUTPATIENT PHYSICAL THERAPY TREATMENT NOTE   Patient Name: [name] [name] MRN: [id number] DOB:1950-07-28, 72 y.o., male Today's Date: 06/29/2022  PCP: Vivi Barrack, MD   REFERRING PROVIDER: Lanae Crumbly, PA-C    END OF SESSION:   PT End of Session - 06/29/22 1046     Visit Number 6    Number of Visits 8    Date for PT Re-Evaluation 08/01/22    Authorization Type MCR    PT Start Time 1045    PT Stop Time 1130    PT Time Calculation (min) 45 min    Activity Tolerance Patient tolerated treatment well    Behavior During Therapy [facility] for tasks assessed/performed              Past Medical History:  Diagnosis Date   Adenomatous colon polyp 09/07/2004   Amblyopia    CAD (coronary artery disease)    cardiac CT with a 50% LAD lesion in 2007   Diverticulosis    DM2 (diabetes mellitus, type 2) (HCC)    Elevated LFTs    secondary to labetalol   Esophageal stricture 2003   External hemorrhoids 2012   lanced by Dr Rise Patience   Family history of anesthesia complication    "sister had PONV"   GERD (gastroesophageal reflux disease)    esophageal stricture, pmh   Hiatal hernia 01/04/2000   History of kidney stones 2022   HLD (hyperlipidemia)    HTN (hypertension)    Hypopotassemia    Past Surgical History:  Procedure Laterality Date   BIOPSY  06/05/2021   Procedure: BIOPSY;  Surgeon: Gatha Mayer, MD;  Location: [facility];  Service: Endoscopy;;   BIOPSY  09/02/2021   Procedure: BIOPSY;  Surgeon: [name]., MD;  Location: [place];  Service: Gastroenterology;;   COLONOSCOPY  06/27/2004   tics , ADENOMATOUS polyps. F/U due 2011   COLONOSCOPY  06/27/2010   Tics   COLONOSCOPY WITH PROPOFOL N/A 09/02/2021   Procedure: COLONOSCOPY WITH PROPOFOL;  Surgeon: Rush Landmark Telford Nab., MD;  Location: [place];  Service: Gastroenterology;  Laterality: N/A;   COLONOSCOPY WITH PROPOFOL N/A 04/14/2022   Procedure: COLONOSCOPY WITH PROPOFOL;  Surgeon:  Rush Landmark Telford Nab., MD;  Location: [facility];  Service: Gastroenterology;  Laterality: N/A;   CORONARY ANGIOPLASTY WITH STENT PLACEMENT  04/01/2014   "2"   ENDOSCOPIC MUCOSAL RESECTION  09/02/2021   Procedure: ENDOSCOPIC MUCOSAL RESECTION;  Surgeon: Rush Landmark Telford Nab., MD;  Location: [place];  Service: Gastroenterology;;   ENDOSCOPIC MUCOSAL RESECTION N/A 04/14/2022   Procedure: ENDOSCOPIC MUCOSAL RESECTION;  Surgeon: [name]., MD;  Location: [facility];  Service: Gastroenterology;  Laterality: N/A;   ESOPHAGEAL BRUSHING  04/14/2022   Procedure: ESOPHAGEAL BRUSHING;  Surgeon: [name]., MD;  Location: [facility];  Service: Gastroenterology;;   ESOPHAGEAL DILATION  06/27/2001   ESOPHAGOGASTRODUODENOSCOPY  06/27/2001   ERD, stricture dilated   ESOPHAGOGASTRODUODENOSCOPY (EGD) WITH PROPOFOL N/A 06/05/2021   Procedure: ESOPHAGOGASTRODUODENOSCOPY (EGD) WITH PROPOFOL;  Surgeon: Gatha Mayer, MD;  [facility];  Service: Endoscopy;  Laterality: N/A;   ESOPHAGOGASTRODUODENOSCOPY (EGD) WITH PROPOFOL N/A 09/02/2021   Procedure: ESOPHAGOGASTRODUODENOSCOPY (EGD) WITH PROPOFOL;  Surgeon: Rush Landmark Telford Nab., MD;  Location: [place];  Service: Gastroenterology;  Laterality: N/A;   ESOPHAGOGASTRODUODENOSCOPY (EGD) WITH PROPOFOL N/A 04/14/2022   Procedure: ESOPHAGOGASTRODUODENOSCOPY (EGD) WITH PROPOFOL;  Surgeon: Rush Landmark Telford Nab., MD;  [facility]: [facility];  Service: Gastroenterology;  Laterality: N/A;   EXCISIONAL HEMORRHOIDECTOMY  ~ 2008  HEMOSTASIS CLIP PLACEMENT  09/02/2021   Procedure: HEMOSTASIS CLIP PLACEMENT;  Surgeon: [name]., MD;  Location: [place];  Service: Gastroenterology;;   HEMOSTASIS CONTROL  04/14/2022   Procedure: HEMOSTASIS CONTROL;  Surgeon: [name]., MD;  Location[facility];  Service: Gastroenterology;;   HOT HEMOSTASIS N/A 09/02/2021   Procedure: HOT HEMOSTASIS (ARGON  PLASMA COAGULATION/BICAP);  Surgeon: [name]., MD;  Location: [place];  Service: Gastroenterology;  Laterality: N/A;   HOT HEMOSTASIS N/A 04/14/2022   Procedure: HOT HEMOSTASIS (ARGON PLASMA COAGULATION/BICAP);  Surgeon: [name]., MD;  Location: [facility];  Service: Gastroenterology;  Laterality: N/A;   LEFT HEART CATHETERIZATION WITH CORONARY ANGIOGRAM N/A 03/26/2014   Procedure: LEFT HEART CATHETERIZATION WITH CORONARY ANGIOGRAM;  Surgeon: Peter M Martinique, MD;  Location: [facility];  Service: Cardiovascular;  Laterality: N/A;   PERCUTANEOUS CORONARY STENT INTERVENTION (PCI-S) N/A 04/01/2014   Procedure: PERCUTANEOUS CORONARY STENT INTERVENTION (PCI-S);  Surgeon: Peter M Martinique, MD;  Location[facility];  Service: Cardiovascular;  Laterality: N/A;   POSTERIOR LAMINECTOMY / DECOMPRESSION LUMBAR SPINE  12/25/2009   L4 , Dr Sherwood Gambler   SHOULDER ARTHROSCOPY WITH ROTATOR CUFF REPAIR AND OPEN BICEPS TENODESIS Left 02/04/2019   Procedure: LEFT SHOULDER ARTHROSCOPY, BICEPS TENODESIS, MINI OPEN ROTATOR CUFF TEAR REPAIR;  Surgeon: Meredith Pel, MD;  Location: [place];  Service: Orthopedics;  Laterality: Left;   SUBMUCOSAL INJECTION  09/02/2021   Procedure: SUBMUCOSAL INJECTION;  Surgeon: Rush Landmark Telford Nab., MD;  Location: [place];  Service: Gastroenterology;;   SUBMUCOSAL LIFTING INJECTION  04/14/2022   Procedure: SUBMUCOSAL LIFTING INJECTION;  Surgeon: [name]., MD;  Location: [facility];  Service: Gastroenterology;;   TOTAL KNEE ARTHROPLASTY Right 05/11/2021   Procedure: TOTAL KNEE ARTHROPLASTY;  Surgeon: Paralee Cancel, MD;  Location: [facility];  Service: Orthopedics;  Laterality: Right;   Patient Active Problem List   Diagnosis Date Noted   IDA (iron deficiency anemia) 08/31/2021   Abnormal colonoscopy 08/29/2021   Tubular adenoma of ascending colon 08/29/2021   Hx of adenomatous colonic polyps 08/29/2021   PUD  (peptic ulcer disease) 08/10/2021   S/P total knee arthroplasty, 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   Nephrolithiasis 03/05/2020   Labral tear of long head of biceps tendon, left, sequela    Complete tear of left rotator cuff    Antiplatelet or antithrombotic long-term use 01/17/2017   Diverticulosis of colon (without mention of hemorrhage) 04/25/2011   Adenomatous colon polyp 03/17/2011   LOW BACK PAIN, CHRONIC 12/13/2007   Hyperlipidemia associated with type 2 diabetes mellitus ([place]) 06/08/2007   T2DM (type 2 diabetes mellitus) ([place]) 02/27/2007   Hypertension associated with diabetes ([place]) 02/27/2007   AMBLYOPIA 12/06/2006   GERD 12/06/2006    REFERRING DIAG: M47.22 (ICD-10-CM) - Other spondylosis with radiculopathy, cervical region   THERAPY DIAG:  Left arm pain   Other spondylosis with radiculopathy, cervical region      Rationale for Evaluation and Treatment Rehabilitation  PERTINENT HISTORY:  1. Left arm pain   2. Other spondylosis with radiculopathy, cervical region       Plan: I am somewhat limited as to what medications I can give patient.  He is unable to take oral NSAIDs due to being on Plavix and I do not want to give a prednisone taper with his history of diabetes.  I did advise him that he can contact his primary care provider to see if they are comfortable prescribing a prednisone taper to see if this helps  relieve his radicular symptoms.  I sent in prescription for Robaxin for spasms.  Will order formal PT for a few weeks.  Follow-up in 3 weeks with Dr. Laurance Flatten for recheck.  If he continues to be symptomatic he will likely need cervical spine MRI throughout HNP/stenosis.   Follow-Up Instructions: Return in about 3 weeks (around 06/16/2022) for WITH DR MOORE RECHECK NECK PAIN AND LEFT UE RADICULOPATHY.     PRECAUTIONS: None  SUBJECTIVE:                                                                                                                                                                                       SUBJECTIVE STATEMENT:  Overall improved but symptom persist at night presenting a posterior arm pain extending from triceps to wrist.   PAIN:  Are you having pain? Yes: NPRS scale: 2-3/10 Pain location: LUE Pain description: ache Aggravating factors: bending forward Relieving factors: rest   OBJECTIVE: (objective measures completed at initial evaluation unless otherwise dated)   DIAGNOSTIC FINDINGS:  none   PATIENT SURVEYS:  FOTO 63(74 predicted); 06/29/22 59   COGNITION: Overall cognitive status: Within functional limits for tasks assessed   SENSATION: Not tested   POSTURE:  elevated L shoulder   PALPATION: TTP L scalenes, levator scapula and infraspinatus          CERVICAL ROM:    Active ROM A/PROM (deg) eval 06/24/22  Flexion 90% 90%  Extension 50% 75%  Right lateral flexion 25% 50%  Left lateral flexion 25% 50%  Right rotation 75% 75%  Left rotation 75% 75%   (Blank rows = not tested)   UPPER EXTREMITY ROM: WFL   Active ROM Right eval Left eval  Shoulder flexion      Shoulder extension      Shoulder abduction      Shoulder adduction      Shoulder extension      Shoulder internal rotation      Shoulder external rotation      Elbow flexion      Elbow extension      Wrist flexion      Wrist extension      Wrist ulnar deviation      Wrist radial deviation      Wrist pronation      Wrist supination       (Blank rows = not tested)   UPPER EXTREMITY MMT:   MMT Right eval Left eval  Shoulder flexion 5 4+  Shoulder extension 5 4+  Shoulder abduction 5 4+  Shoulder adduction      Shoulder extension      Shoulder internal rotation  Shoulder external rotation      Middle trapezius      Lower trapezius      Elbow flexion      Elbow extension      Wrist flexion      Wrist extension      Wrist ulnar deviation      Wrist radial deviation      Wrist pronation      Wrist supination      Grip strength 83 66   Pinch key 17 20                                                                CERVICAL SPECIAL TESTS:  Spurling's test: Negative   TODAY'S TREATMENT:     [facility] Adult PT Treatment:                                                DATE: 06/29/22 Therapeutic Exercise: Nustep L4 8 min Manual Therapy: ULTT to LUE focus on ulnar nerve L first rib mobilization L scalene stretch 30s x3 STM L paracervicals    [facility] Adult PT Treatment:                                                DATE: 06/24/22 Therapeutic Exercise: Nustep L4 8 min Chin tuck over 1/2 roll 3s hold 2x10 Supine flexion w/cane focus on inspiration 15x 3# Supine alternating shoulder flexion 1# 15/15  Supine hor abd YTB 15x Prone L shoulder extension 1# 15x Prone L shoulder flexion 1# 15x Prone L hor abduction IR/ER 1# 10/10 Scapular wall slides with YTB resistance 15x Corner stretch 30s x3 Manual Therapy: 4 way scapula 10x   [facility] Adult PT Treatment:                                                DATE: 06/22/22 Therapeutic Exercise: Nustep L4 8 min Chin tuck over 1/2 roll 3s hold 2x10 Supine flexion w/cane focus on inspiration 15x 2# Supine alternating shoulder flexion 1# 15/15  L UT stretch 30s x2 L levator stretch 30s x2 Scapular wall slides with YTB resistance 15x Corner stretch 30s x3 Manual Therapy: L pec minor STM Manual scalene stretch 30s hold First rib mob L 10xx followed by MWM of shoulder flexion 10x   [facility] Adult PT Treatment:                                                DATE: 06/17/22 Therapeutic Exercise: Nustep L2 8 min Chin tuck over 1/2 roll 3s hold 2x10 Supine flexion w/cane focus on inspiration 15x  L UT stretch 30s x2 L levator stretch 30s x2 Scapular wall slides with pillow case resistance 15x Manual Therapy: Manual  scalene stretch 30s hold L ULTT to determine symptoms, only trace symptom reproduction        [facility] Adult PT Treatment:                                                DATE:  06/14/2022 Therapeutic Exercise: UBE level 2 3/3 fwd/bwd Rows GTB 3x10 Shoulder extension GTB 3x10 Pball roll up wall with end range stretch x10 Doorway pec stretch 2x30" Seated horizontal abduction RTB 2x10 Upper trap stretch 2x30" BIL Levator scap stretch 2x30" BIL Manual Therapy: STM BIL upper traps Positional release BIL upper traps                                                                                                                 DATE: Eval and HEP   PATIENT EDUCATION:  Education details: Patient to demonstrate independence in HEP  Person educated: Patient Education method: Explanation Education comprehension: verbalized understanding and needs further education   HOME EXERCISE PROGRAM: Access Code: [email or web address] URL: [email or web address] Date: 06/24/2022 Prepared by: Sharlynn Oliphant  Exercises - Seated Cervical Sidebending Stretch  - 2 x daily - 5 x weekly - 1 sets - 30s hold - Seated Levator Scapulae Stretch on Wall  - 2 x daily - 5 x weekly - 1 sets - 3 reps - 30s hold - Scapular Wall Slides  - 2 x daily - 5 x weekly - 1 sets - 15 reps - Corner Pec Major Stretch  - 2 x daily - 5 x weekly - 1 sets - 3 reps - 30s hold  ASSESSMENT:   CLINICAL IMPRESSION: Unable to elicit symptoms with nerve tension testing or first rib mobilization.  Continued stretching of L scalene group as well as L first rib mobilization.  STM mobilization to L paracervical musculature.  Unable to elicit symptoms with cervical motions.     OBJECTIVE IMPAIRMENTS: decreased activity tolerance, decreased knowledge of condition, decreased mobility, decreased ROM, decreased strength, increased fascial restrictions, increased muscle spasms, impaired UE functional use, postural dysfunction, and pain.    ACTIVITY LIMITATIONS: carrying, lifting, bending, and reach over head   PERSONAL FACTORS: Age, Fitness, and Time since onset of injury/illness/exacerbation are also affecting  patient's functional outcome.    REHAB POTENTIAL: Good   CLINICAL DECISION MAKING: Stable/uncomplicated   EVALUATION COMPLEXITY: Low     GOALS: Goals reviewed with patient? No   SHORT TERM GOALS: Target date: 06/20/2022     Patient to demonstrate independence in HEP  Baseline: [id number] Goal status: Met   2.  Increase B SB to 50% Baseline:  Active ROM A/PROM (deg) eval 06/24/22  Flexion 90% 90%  Extension 50% 75%  Right lateral flexion 25% 50%  Left lateral flexion 25% 50%  Right rotation 75% 75%  Left rotation 75% 75%   Goal status: Met     LONG TERM GOALS: Target date: 08/15/2022  Increase FOTO score to 74 Baseline: 52 Goal status: INITIAL   2.  Decrease pain to 2/10 Baseline: 4/10 Goal status: INITIAL   3.  Patient to bend forward to tie shoes w/o pain/symptoms Baseline: LUE symptoms when tying shoes Goal status: INITIAL     PLAN:   PT FREQUENCY: 2x/week   PT DURATION: 4 weeks   PLANNED INTERVENTIONS: Therapeutic exercises, Therapeutic activity, Neuromuscular re-education, Balance training, Gait training, Patient/Family education, Self Care, Joint mobilization, Dry Needling, Manual therapy, and Re-evaluation   PLAN FOR NEXT SESSION: HEP review and update, ROM and stretching postural training, manual techniques as indicated   Lanice Shirts, PT 06/29/2022, 12:09 PM

## 2022-07-01 ENCOUNTER — Ambulatory Visit: Payer: Medicare Other

## 2022-07-01 DIAGNOSIS — M542 Cervicalgia: Secondary | ICD-10-CM

## 2022-07-01 DIAGNOSIS — M5412 Radiculopathy, cervical region: Secondary | ICD-10-CM | POA: Diagnosis not present

## 2022-07-01 NOTE — Therapy (Signed)
OUTPATIENT PHYSICAL THERAPY TREATMENT NOTE   Patient Name: [name] [name] MRN: [id number] DOB:19-Mar-1951, 72 y.o., male Today's Date: 07/01/2022  PCP: Vivi Barrack, MD   REFERRING PROVIDER: Lanae Crumbly, PA-C    END OF SESSION:   PT End of Session - 07/01/22 1046     Visit Number 7    Number of Visits 8    Date for PT Re-Evaluation 08/01/22    Authorization Type MCR    PT Start Time 1045    PT Stop Time 1125    PT Time Calculation (min) 40 min    Activity Tolerance Patient tolerated treatment well    Behavior During Therapy [facility] for tasks assessed/performed              Past Medical History:  Diagnosis Date   Adenomatous colon polyp 09/07/2004   Amblyopia    CAD (coronary artery disease)    cardiac CT with a 50% LAD lesion in 2007   Diverticulosis    DM2 (diabetes mellitus, type 2) (HCC)    Elevated LFTs    secondary to labetalol   Esophageal stricture 2003   External hemorrhoids 2012   lanced by Dr Rise Patience   Family history of anesthesia complication    "sister had PONV"   GERD (gastroesophageal reflux disease)    esophageal stricture, pmh   Hiatal hernia 01/04/2000   History of kidney stones 2022   HLD (hyperlipidemia)    HTN (hypertension)    Hypopotassemia    Past Surgical History:  Procedure Laterality Date   BIOPSY  06/05/2021   Procedure: BIOPSY;  Surgeon: Gatha Mayer, MD;  Location: [facility];  Service: Endoscopy;;   BIOPSY  09/02/2021   Procedure: BIOPSY;  Surgeon: [name]., MD;  Location: [place];  Service: Gastroenterology;;   COLONOSCOPY  06/27/2004   tics , ADENOMATOUS polyps. F/U due 2011   COLONOSCOPY  06/27/2010   Tics   COLONOSCOPY WITH PROPOFOL N/A 09/02/2021   Procedure: COLONOSCOPY WITH PROPOFOL;  Surgeon: Rush Landmark Telford Nab., MD;  Location: [place];  Service: Gastroenterology;  Laterality: N/A;   COLONOSCOPY WITH PROPOFOL N/A 04/14/2022   Procedure: COLONOSCOPY WITH PROPOFOL;  Surgeon:  Rush Landmark Telford Nab., MD;  Location: [facility];  Service: Gastroenterology;  Laterality: N/A;   CORONARY ANGIOPLASTY WITH STENT PLACEMENT  04/01/2014   "2"   ENDOSCOPIC MUCOSAL RESECTION  09/02/2021   Procedure: ENDOSCOPIC MUCOSAL RESECTION;  Surgeon: Rush Landmark Telford Nab., MD;  Location: [place];  Service: Gastroenterology;;   ENDOSCOPIC MUCOSAL RESECTION N/A 04/14/2022   Procedure: ENDOSCOPIC MUCOSAL RESECTION;  Surgeon: [name]., MD;  Location: [facility];  Service: Gastroenterology;  Laterality: N/A;   ESOPHAGEAL BRUSHING  04/14/2022   Procedure: ESOPHAGEAL BRUSHING;  Surgeon: [name]., MD;  Location: [facility];  Service: Gastroenterology;;   ESOPHAGEAL DILATION  06/27/2001   ESOPHAGOGASTRODUODENOSCOPY  06/27/2001   ERD, stricture dilated   ESOPHAGOGASTRODUODENOSCOPY (EGD) WITH PROPOFOL N/A 06/05/2021   Procedure: ESOPHAGOGASTRODUODENOSCOPY (EGD) WITH PROPOFOL;  Surgeon: Gatha Mayer, MD;  [facility];  Service: Endoscopy;  Laterality: N/A;   ESOPHAGOGASTRODUODENOSCOPY (EGD) WITH PROPOFOL N/A 09/02/2021   Procedure: ESOPHAGOGASTRODUODENOSCOPY (EGD) WITH PROPOFOL;  Surgeon: Rush Landmark Telford Nab., MD;  Location: [place];  Service: Gastroenterology;  Laterality: N/A;   ESOPHAGOGASTRODUODENOSCOPY (EGD) WITH PROPOFOL N/A 04/14/2022   Procedure: ESOPHAGOGASTRODUODENOSCOPY (EGD) WITH PROPOFOL;  Surgeon: Rush Landmark Telford Nab., MD;  [facility]: [facility];  Service: Gastroenterology;  Laterality: N/A;   EXCISIONAL HEMORRHOIDECTOMY  ~ 2008  HEMOSTASIS CLIP PLACEMENT  09/02/2021   Procedure: HEMOSTASIS CLIP PLACEMENT;  Surgeon: [name]., MD;  Location: [place];  Service: Gastroenterology;;   HEMOSTASIS CONTROL  04/14/2022   Procedure: HEMOSTASIS CONTROL;  Surgeon: [name]., MD;  Location[facility];  Service: Gastroenterology;;   HOT HEMOSTASIS N/A 09/02/2021   Procedure: HOT HEMOSTASIS (ARGON  PLASMA COAGULATION/BICAP);  Surgeon: [name]., MD;  Location: [place];  Service: Gastroenterology;  Laterality: N/A;   HOT HEMOSTASIS N/A 04/14/2022   Procedure: HOT HEMOSTASIS (ARGON PLASMA COAGULATION/BICAP);  Surgeon: [name]., MD;  Location: [facility];  Service: Gastroenterology;  Laterality: N/A;   LEFT HEART CATHETERIZATION WITH CORONARY ANGIOGRAM N/A 03/26/2014   Procedure: LEFT HEART CATHETERIZATION WITH CORONARY ANGIOGRAM;  Surgeon: Peter M Martinique, MD;  Location: [facility];  Service: Cardiovascular;  Laterality: N/A;   PERCUTANEOUS CORONARY STENT INTERVENTION (PCI-S) N/A 04/01/2014   Procedure: PERCUTANEOUS CORONARY STENT INTERVENTION (PCI-S);  Surgeon: Peter M Martinique, MD;  Location: [facility] C[facility];  Service: Cardiovascular;  Laterality: N/A;   POSTERIOR LAMINECTOMY / DECOMPRESSION LUMBAR SPINE  12/25/2009   L4 , Dr Sherwood Gambler   SHOULDER ARTHROSCOPY WITH ROTATOR CUFF REPAIR AND OPEN BICEPS TENODESIS Left 02/04/2019   Procedure: LEFT SHOULDER ARTHROSCOPY, BICEPS TENODESIS, MINI OPEN ROTATOR CUFF TEAR REPAIR;  Surgeon: Meredith Pel, MD;  Location: [place];  Service: Orthopedics;  Laterality: Left;   SUBMUCOSAL INJECTION  09/02/2021   Procedure: SUBMUCOSAL INJECTION;  Surgeon: Rush Landmark Telford Nab., MD;  Location: [place];  Service: Gastroenterology;;   SUBMUCOSAL LIFTING INJECTION  04/14/2022   Procedure: SUBMUCOSAL LIFTING INJECTION;  Surgeon: [name]., MD;  Location: [facility];  Service: Gastroenterology;;   TOTAL KNEE ARTHROPLASTY Right 05/11/2021   Procedure: TOTAL KNEE ARTHROPLASTY;  Surgeon: Paralee Cancel, MD;  Location: [facility];  Service: Orthopedics;  Laterality: Right;   Patient Active Problem List   Diagnosis Date Noted   IDA (iron deficiency anemia) 08/31/2021   Abnormal colonoscopy 08/29/2021   Tubular adenoma of ascending colon 08/29/2021   Hx of adenomatous colonic polyps 08/29/2021   PUD  (peptic ulcer disease) 08/10/2021   S/P total knee arthroplasty, 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   Nephrolithiasis 03/05/2020   Labral tear of long head of biceps tendon, left, sequela    Complete tear of left rotator cuff    Antiplatelet or antithrombotic long-term use 01/17/2017   Diverticulosis of colon (without mention of hemorrhage) 04/25/2011   Adenomatous colon polyp 03/17/2011   LOW BACK PAIN, CHRONIC 12/13/2007   Hyperlipidemia associated with type 2 diabetes mellitus ([place]) 06/08/2007   T2DM (type 2 diabetes mellitus) ([place]) 02/27/2007   Hypertension associated with diabetes ([place]) 02/27/2007   AMBLYOPIA 12/06/2006   GERD 12/06/2006    REFERRING DIAG: M47.22 (ICD-10-CM) - Other spondylosis with radiculopathy, cervical region   THERAPY DIAG:  Left arm pain   Other spondylosis with radiculopathy, cervical region      Rationale for Evaluation and Treatment Rehabilitation  PERTINENT HISTORY:  1. Left arm pain   2. Other spondylosis with radiculopathy, cervical region       Plan: I am somewhat limited as to what medications I can give patient.  He is unable to take oral NSAIDs due to being on Plavix and I do not want to give a prednisone taper with his history of diabetes.  I did advise him that he can contact his primary care provider to see if they are comfortable prescribing a prednisone taper to see if this helps  relieve his radicular symptoms.  I sent in prescription for Robaxin for spasms.  Will order formal PT for a few weeks.  Follow-up in 3 weeks with Dr. Laurance Flatten for recheck.  If he continues to be symptomatic he will likely need cervical spine MRI throughout HNP/stenosis.   Follow-Up Instructions: Return in about 3 weeks (around 06/16/2022) for WITH DR MOORE RECHECK NECK PAIN AND LEFT UE RADICULOPATHY.     PRECAUTIONS: None  SUBJECTIVE:                                                                                                                                                                                       SUBJECTIVE STATEMENT:  Symptoms greatly reduced, almost pain free with aggravating tasks of sleep positions and bending over to tie shoes   PAIN:  Are you having pain? Yes: NPRS scale: 2-3/10 Pain location: LUE Pain description: ache Aggravating factors: bending forward Relieving factors: rest   OBJECTIVE: (objective measures completed at initial evaluation unless otherwise dated)   DIAGNOSTIC FINDINGS:  none   PATIENT SURVEYS:  FOTO 63(74 predicted); 06/29/22 59   COGNITION: Overall cognitive status: Within functional limits for tasks assessed   SENSATION: Not tested   POSTURE:  elevated L shoulder   PALPATION: TTP L scalenes, levator scapula and infraspinatus          CERVICAL ROM:    Active ROM A/PROM (deg) eval 06/24/22  Flexion 90% 90%  Extension 50% 75%  Right lateral flexion 25% 50%  Left lateral flexion 25% 50%  Right rotation 75% 75%  Left rotation 75% 75%   (Blank rows = not tested)   UPPER EXTREMITY ROM: WFL   Active ROM Right eval Left eval  Shoulder flexion      Shoulder extension      Shoulder abduction      Shoulder adduction      Shoulder extension      Shoulder internal rotation      Shoulder external rotation      Elbow flexion      Elbow extension      Wrist flexion      Wrist extension      Wrist ulnar deviation      Wrist radial deviation      Wrist pronation      Wrist supination       (Blank rows = not tested)   UPPER EXTREMITY MMT:   MMT Right eval Left eval  Shoulder flexion 5 4+  Shoulder extension 5 4+  Shoulder abduction 5 4+  Shoulder adduction      Shoulder extension      Shoulder internal rotation  Shoulder external rotation      Middle trapezius      Lower trapezius      Elbow flexion      Elbow extension      Wrist flexion      Wrist extension      Wrist ulnar deviation      Wrist radial deviation      Wrist pronation      Wrist supination      Grip strength 83 66   Pinch key 17 20                                                                CERVICAL SPECIAL TESTS:  Spurling's test: Negative   TODAY'S TREATMENT:     [facility] Adult PT Treatment:                                                DATE: 07/01/22 Therapeutic Exercise: Nustep L4 8 min Supine alternating shoulder flexion 2# 15/15  Supine hor abd YTB 15x2 Supine press and protract 1000g 15x Scapular wall glides w/foam roll 15x Wall pushups w/ball 15x  Manual Therapy: L first rib mob 2x10 STM to L scalenes  4 way scapula 15x each  [facility] Adult PT Treatment:                                                DATE: 06/29/22 Therapeutic Exercise: Nustep L4 8 min Manual Therapy: ULTT to LUE focus on ulnar nerve L first rib mobilization L scalene stretch 30s x3 STM L paracervicals    [facility] Adult PT Treatment:                                                DATE: 06/24/22 Therapeutic Exercise: Nustep L4 8 min Chin tuck over 1/2 roll 3s hold 2x10 Supine flexion w/cane focus on inspiration 15x 3# Supine alternating shoulder flexion 1# 15/15  Supine hor abd YTB 15x Prone L shoulder extension 1# 15x Prone L shoulder flexion 1# 15x Prone L hor abduction IR/ER 1# 10/10 Scapular wall slides with YTB resistance 15x Corner stretch 30s x3 Manual Therapy: 4 way scapula 10x                                                                                                                   DATE: Eval and HEP  PATIENT EDUCATION:  Education details: Patient to demonstrate independence in HEP  Person educated: Patient Education method: Explanation Education comprehension: verbalized understanding and needs further education   HOME EXERCISE PROGRAM: Access Code: [email or web address] URL: [email or web address] Date: 06/24/2022 Prepared by: Sharlynn Oliphant  Exercises - Seated Cervical Sidebending Stretch  - 2 x daily - 5 x weekly - 1 sets - 30s hold - Seated Levator Scapulae Stretch on Wall  - 2 x  daily - 5 x weekly - 1 sets - 3 reps - 30s hold - Scapular Wall Slides  - 2 x daily - 5 x weekly - 1 sets - 15 reps - Corner Pec Major Stretch  - 2 x daily - 5 x weekly - 1 sets - 3 reps - 30s hold  ASSESSMENT:   CLINICAL IMPRESSION: Symptoms markedly improved.  Focus of today was additional strengthening tasks focus on scapular mobility in AP directions.  Added resistance and reps as noted and continued STM to L paracervicals.  Excess scapular winging with wall pushups     OBJECTIVE IMPAIRMENTS: decreased activity tolerance, decreased knowledge of condition, decreased mobility, decreased ROM, decreased strength, increased fascial restrictions, increased muscle spasms, impaired UE functional use, postural dysfunction, and pain.    ACTIVITY LIMITATIONS: carrying, lifting, bending, and reach over head   PERSONAL FACTORS: Age, Fitness, and Time since onset of injury/illness/exacerbation are also affecting patient's functional outcome.    REHAB POTENTIAL: Good   CLINICAL DECISION MAKING: Stable/uncomplicated   EVALUATION COMPLEXITY: Low     GOALS: Goals reviewed with patient? No   SHORT TERM GOALS: Target date: 06/20/2022     Patient to demonstrate independence in HEP  Baseline: [id number] Goal status: Met   2.  Increase B SB to 50% Baseline:  Active ROM A/PROM (deg) eval 06/24/22  Flexion 90% 90%  Extension 50% 75%  Right lateral flexion 25% 50%  Left lateral flexion 25% 50%  Right rotation 75% 75%  Left rotation 75% 75%   Goal status: Met     LONG TERM GOALS: Target date: 08/15/2022     Increase FOTO score to 74 Baseline: 52 Goal status: INITIAL   2.  Decrease pain to 2/10 Baseline: 4/10 Goal status: INITIAL   3.  Patient to bend forward to tie shoes w/o pain/symptoms Baseline: LUE symptoms when tying shoes Goal status: INITIAL     PLAN:   PT FREQUENCY: 2x/week   PT DURATION: 4 weeks   PLANNED INTERVENTIONS: Therapeutic exercises, Therapeutic activity,  Neuromuscular re-education, Balance training, Gait training, Patient/Family education, Self Care, Joint mobilization, Dry Needling, Manual therapy, and Re-evaluation   PLAN FOR NEXT SESSION: HEP review and update, ROM and stretching postural training, manual techniques as indicated   Lanice Shirts, PT 07/01/2022, 11:39 AM

## 2022-07-06 ENCOUNTER — Ambulatory Visit: Payer: Medicare Other

## 2022-07-06 DIAGNOSIS — M542 Cervicalgia: Secondary | ICD-10-CM

## 2022-07-06 DIAGNOSIS — M5412 Radiculopathy, cervical region: Secondary | ICD-10-CM

## 2022-07-06 NOTE — Therapy (Signed)
OUTPATIENT PHYSICAL THERAPY TREATMENT NOTE/DC SUMMARY   Patient Name: [name] [name] MRN: [id number] DOB:05-06-1951, 72 y.o., male Today's Date: 07/06/2022  PCP: Vivi Barrack, MD   REFERRING PROVIDER: Lanae Crumbly, PA-C   PHYSICAL THERAPY DISCHARGE SUMMARY  Visits from Start of Care: 8  Current functional level related to goals / functional outcomes: Rehab goals met   Remaining deficits: none   Education / Equipment: HEP   Patient agrees to discharge. Patient goals were met. Patient is being discharged due to being pleased with the current functional level.  END OF SESSION:   PT End of Session - 07/06/22 1056     Visit Number 8    Number of Visits 8    Date for PT Re-Evaluation 08/01/22    Authorization Type MCR    PT Start Time 7564    PT Stop Time 1130    PT Time Calculation (min) 45 min    Activity Tolerance Patient tolerated treatment well    Behavior During Therapy [facility] for tasks assessed/performed              Past Medical History:  Diagnosis Date   Adenomatous colon polyp 09/07/2004   Amblyopia    CAD (coronary artery disease)    cardiac CT with a 50% LAD lesion in 2007   Diverticulosis    DM2 (diabetes mellitus, type 2) (HCC)    Elevated LFTs    secondary to labetalol   Esophageal stricture 2003   External hemorrhoids 2012   lanced by Dr Rise Patience   Family history of anesthesia complication    "sister had PONV"   GERD (gastroesophageal reflux disease)    esophageal stricture, pmh   Hiatal hernia 01/04/2000   History of kidney stones 2022   HLD (hyperlipidemia)    HTN (hypertension)    Hypopotassemia    Past Surgical History:  Procedure Laterality Date   BIOPSY  06/05/2021   Procedure: BIOPSY;  Surgeon: Gatha Mayer, MD;  Location: [facility];  Service: Endoscopy;;   BIOPSY  09/02/2021   Procedure: BIOPSY;  Surgeon: [name]., MD;  Location: [place];  Service: Gastroenterology;;   COLONOSCOPY  06/27/2004   tics  , ADENOMATOUS polyps. F/U due 2011   COLONOSCOPY  06/27/2010   Tics   COLONOSCOPY WITH PROPOFOL N/A 09/02/2021   Procedure: COLONOSCOPY WITH PROPOFOL;  Surgeon: Rush Landmark Telford Nab., MD;  Location: [place];  Service: Gastroenterology;  Laterality: N/A;   COLONOSCOPY WITH PROPOFOL N/A 04/14/2022   Procedure: COLONOSCOPY WITH PROPOFOL;  Surgeon: Rush Landmark Telford Nab., MD;  [facility];  Service: Gastroenterology;  Laterality: N/A;   CORONARY ANGIOPLASTY WITH STENT PLACEMENT  04/01/2014   "2"   ENDOSCOPIC MUCOSAL RESECTION  09/02/2021   Procedure: ENDOSCOPIC MUCOSAL RESECTION;  Surgeon: Rush Landmark Telford Nab., MD;  Location: [place];  Service: Gastroenterology;;   ENDOSCOPIC MUCOSAL RESECTION N/A 04/14/2022   Procedure: ENDOSCOPIC MUCOSAL RESECTION;  Surgeon: [name]., MD;  [facility];  Service: Gastroenterology;  Laterality: N/A;   ESOPHAGEAL BRUSHING  04/14/2022   Procedure: ESOPHAGEAL BRUSHING;  Surgeon: [name]., MD;  [facility];  Service: Gastroenterology;;   ESOPHAGEAL DILATION  06/27/2001   ESOPHAGOGASTRODUODENOSCOPY  06/27/2001   ERD, stricture dilated   ESOPHAGOGASTRODUODENOSCOPY (EGD) WITH PROPOFOL N/A 06/05/2021   Procedure: ESOPHAGOGASTRODUODENOSCOPY (EGD) WITH PROPOFOL;  Surgeon: Gatha Mayer, MD;  [facility];  Service: Endoscopy;  Laterality: N/A;   ESOPHAGOGASTRODUODENOSCOPY (EGD) WITH PROPOFOL N/A 09/02/2021   Procedure: ESOPHAGOGASTRODUODENOSCOPY (  EGD) WITH PROPOFOL;  Surgeon: Mansouraty, Telford Nab., MD;  Location: [place];  Service: Gastroenterology;  Laterality: N/A;   ESOPHAGOGASTRODUODENOSCOPY (EGD) WITH PROPOFOL N/A 04/14/2022   Procedure: ESOPHAGOGASTRODUODENOSCOPY (EGD) WITH PROPOFOL;  Surgeon: Rush Landmark Telford Nab., MD;  [facility];  Service: Gastroenterology;  Laterality: N/A;   EXCISIONAL HEMORRHOIDECTOMY  ~ 2008   HEMOSTASIS CLIP PLACEMENT  09/02/2021    Procedure: HEMOSTASIS CLIP PLACEMENT;  Surgeon: Rush Landmark Telford Nab., MD;  Location: [place];  Service: Gastroenterology;;   HEMOSTASIS CONTROL  04/14/2022   Procedure: HEMOSTASIS CONTROL;  Surgeon: [name]., MD;  Location: [facility];  Service: Gastroenterology;;   HOT HEMOSTASIS N/A 09/02/2021   Procedure: HOT HEMOSTASIS (ARGON PLASMA COAGULATION/BICAP);  Surgeon: [name]., MD;  Location: [place];  Service: Gastroenterology;  Laterality: N/A;   HOT HEMOSTASIS N/A 04/14/2022   Procedure: HOT HEMOSTASIS (ARGON PLASMA COAGULATION/BICAP);  Surgeon: [name]., MD;  Location: [facility];  Service: Gastroenterology;  Laterality: N/A;   LEFT HEART CATHETERIZATION WITH CORONARY ANGIOGRAM N/A 03/26/2014   Procedure: LEFT HEART CATHETERIZATION WITH CORONARY ANGIOGRAM;  Surgeon: Peter M Martinique, MD;  Location: [facility] CATH [facility];  Service: Cardiovascular;  Laterality: N/A;   PERCUTANEOUS CORONARY STENT INTERVENTION (PCI-S) N/A 04/01/2014   Procedure: PERCUTANEOUS CORONARY STENT INTERVENTION (PCI-S);  Surgeon: Peter M Martinique, MD;  Location: [facility] CATH LAB;  Service: Cardiovascular;  Laterality: N/A;   POSTERIOR LAMINECTOMY / DECOMPRESSION LUMBAR SPINE  12/25/2009   L4 , Dr Sherwood Gambler   SHOULDER ARTHROSCOPY WITH ROTATOR CUFF REPAIR AND OPEN BICEPS TENODESIS Left 02/04/2019   Procedure: LEFT SHOULDER ARTHROSCOPY, BICEPS TENODESIS, MINI OPEN ROTATOR CUFF TEAR REPAIR;  Surgeon: Meredith Pel, MD;  Location: [place];  Service: Orthopedics;  Laterality: Left;   SUBMUCOSAL INJECTION  09/02/2021   Procedure: SUBMUCOSAL INJECTION;  Surgeon: Rush Landmark Telford Nab., MD;  Location: [place];  Service: Gastroenterology;;   SUBMUCOSAL LIFTING INJECTION  04/14/2022   Procedure: SUBMUCOSAL LIFTING INJECTION;  Surgeon: [name]., MD;  Location: [facility];  Service: Gastroenterology;;   TOTAL KNEE ARTHROPLASTY Right 05/11/2021    Procedure: TOTAL KNEE ARTHROPLASTY;  Surgeon: Paralee Cancel, MD;  [facility];  Service: Orthopedics;  Laterality: Right;   Patient Active Problem List   Diagnosis Date Noted   IDA (iron deficiency anemia) 08/31/2021   Abnormal colonoscopy 08/29/2021   Tubular adenoma of ascending colon 08/29/2021   Hx of adenomatous colonic polyps 08/29/2021   PUD (peptic ulcer disease) 08/10/2021   S/P total knee arthroplasty, 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   Nephrolithiasis 03/05/2020   Labral tear of long head of biceps tendon, left, sequela    Complete tear of left rotator cuff    Antiplatelet or antithrombotic long-term use 01/17/2017   Diverticulosis of colon (without mention of hemorrhage) 04/25/2011   Adenomatous colon polyp 03/17/2011   LOW BACK PAIN, CHRONIC 12/13/2007   Hyperlipidemia associated with type 2 diabetes mellitus ([place]) 06/08/2007   T2DM (type 2 diabetes mellitus) ([place]) 02/27/2007   Hypertension associated with diabetes ([place]) 02/27/2007   AMBLYOPIA 12/06/2006   GERD 12/06/2006    REFERRING DIAG: M47.22 (ICD-10-CM) - Other spondylosis with radiculopathy, cervical region   THERAPY DIAG:  Left arm pain   Other spondylosis with radiculopathy, cervical region      Rationale for Evaluation and Treatment Rehabilitation  PERTINENT HISTORY:  1. Left arm pain   2. Other spondylosis with radiculopathy, cervical region       Plan: I am somewhat limited as to what medications I  can give patient.  He is unable to take oral NSAIDs due to being on Plavix and I do not want to give a prednisone taper with his history of diabetes.  I did advise him that he can contact his primary care provider to see if they are comfortable prescribing a prednisone taper to see if this helps relieve his radicular symptoms.  I sent in prescription for Robaxin for spasms.  Will order formal PT for a few weeks.  Follow-up in 3 weeks with Dr. Laurance Flatten for recheck.  If he continues to be symptomatic he will likely need  cervical spine MRI throughout HNP/stenosis.   Follow-Up Instructions: Return in about 3 weeks (around 06/16/2022) for WITH DR MOORE RECHECK NECK PAIN AND LEFT UE RADICULOPATHY.     PRECAUTIONS: None  SUBJECTIVE:                                                                                                                                                                                      SUBJECTIVE STATEMENT:  Continues to do well, able to sleep through the night and tie shoes w/o symptoms.  Still experiences N&T in L 4-5 digits at times.  Feels he is ready for independent management.   PAIN:  Are you having pain? Yes: NPRS scale: 0/10 Pain location: LUE Pain description: ache Aggravating factors: bending forward Relieving factors: rest   OBJECTIVE: (objective measures completed at initial evaluation unless otherwise dated)   DIAGNOSTIC FINDINGS:  none   PATIENT SURVEYS:  FOTO 63(74 predicted); 06/29/22 59   COGNITION: Overall cognitive status: Within functional limits for tasks assessed   SENSATION: Not tested   POSTURE:  elevated L shoulder   PALPATION: TTP L scalenes, levator scapula and infraspinatus          CERVICAL ROM:    Active ROM A/PROM (deg) eval 06/24/22  Flexion 90% 90%  Extension 50% 75%  Right lateral flexion 25% 50%  Left lateral flexion 25% 50%  Right rotation 75% 75%  Left rotation 75% 75%   (Blank rows = not tested)   UPPER EXTREMITY ROM: WFL   Active ROM Right eval Left eval  Shoulder flexion      Shoulder extension      Shoulder abduction      Shoulder adduction      Shoulder extension      Shoulder internal rotation      Shoulder external rotation      Elbow flexion      Elbow extension      Wrist flexion      Wrist extension      Wrist ulnar deviation  Wrist radial deviation      Wrist pronation      Wrist supination       (Blank rows = not tested)   UPPER EXTREMITY MMT:   MMT Right eval Left eval  Shoulder  flexion 5 4+  Shoulder extension 5 4+  Shoulder abduction 5 4+  Shoulder adduction      Shoulder extension      Shoulder internal rotation      Shoulder external rotation      Middle trapezius      Lower trapezius      Elbow flexion      Elbow extension      Wrist flexion      Wrist extension      Wrist ulnar deviation      Wrist radial deviation      Wrist pronation      Wrist supination      Grip strength 83 66  Pinch key 17 20                                                                CERVICAL SPECIAL TESTS:  Spurling's test: Negative   TODAY'S TREATMENT:     [facility] Adult PT Treatment:                                                DATE: 07/06/22 Therapeutic Exercise: Nustep L5 8 min Supine alternating shoulder flexion 2# 15/15  Supine hor abd RTB 15x2 Supine press and protract 2000g 15x2 Scapular wall glides w/foam roll 15x Wall pushups w/ball 15xon airex Manual Therapy: L first rib mob 2x10 STM to L scalenes  4 way scapula 15x each  [facility] Adult PT Treatment:                                                DATE: 07/01/22 Therapeutic Exercise: Nustep L4 8 min Supine alternating shoulder flexion 2# 15/15  Supine hor abd YTB 15x2 Supine press and protract 1000g 15x Scapular wall glides w/foam roll 15x Wall pushups w/ball 15x  Manual Therapy: L first rib mob 2x10 STM to L scalenes  4 way scapula 15x each  [facility] Adult PT Treatment:                                                DATE: 06/29/22 Therapeutic Exercise: Nustep L4 8 min Manual Therapy: ULTT to LUE focus on ulnar nerve L first rib mobilization L scalene stretch 30s x3 STM L paracervicals  DATE: Eval and HEP   PATIENT EDUCATION:  Education details: Patient to demonstrate independence in HEP  Person educated: Patient Education method: Explanation Education comprehension: verbalized  understanding and needs further education   HOME EXERCISE PROGRAM: Access Code: [email or web address] URL: [email or web address] Date: 06/24/2022 Prepared by: Sharlynn Oliphant  Exercises - Seated Cervical Sidebending Stretch  - 2 x daily - 5 x weekly - 1 sets - 30s hold - Seated Levator Scapulae Stretch on Wall  - 2 x daily - 5 x weekly - 1 sets - 3 reps - 30s hold - Scapular Wall Slides  - 2 x daily - 5 x weekly - 1 sets - 15 reps - Corner Pec Major Stretch  - 2 x daily - 5 x weekly - 1 sets - 3 reps - 30s hold  ASSESSMENT:   CLINICAL IMPRESSION: Remains essentially symptom free since last session, able to sleep through the night and tie shoes.  Added resistance to aerobic tasks.     OBJECTIVE IMPAIRMENTS: decreased activity tolerance, decreased knowledge of condition, decreased mobility, decreased ROM, decreased strength, increased fascial restrictions, increased muscle spasms, impaired UE functional use, postural dysfunction, and pain.    ACTIVITY LIMITATIONS: carrying, lifting, bending, and reach over head   PERSONAL FACTORS: Age, Fitness, and Time since onset of injury/illness/exacerbation are also affecting patient's functional outcome.    REHAB POTENTIAL: Good   CLINICAL DECISION MAKING: Stable/uncomplicated   EVALUATION COMPLEXITY: Low     GOALS: Goals reviewed with patient? No   SHORT TERM GOALS: Target date: 06/20/2022     Patient to demonstrate independence in HEP  Baseline: [id number] Goal status: Met   2.  Increase B SB to 50% Baseline:  Active ROM A/PROM (deg) eval 06/24/22  Flexion 90% 90%  Extension 50% 75%  Right lateral flexion 25% 50%  Left lateral flexion 25% 50%  Right rotation 75% 75%  Left rotation 75% 75%   Goal status: Met     LONG TERM GOALS: Target date: 08/15/2022     Increase FOTO score to 74 Baseline: 52 Goal status: Ongoing   2.  Decrease pain to 2/10 Baseline: 4/10; 07/06/22 0/10 Goal status: Met   3.  Patient to bend  forward to tie shoes w/o pain/symptoms Baseline: LUE symptoms when tying shoes; 07/06/22 No pain Goal status: Met     PLAN:   PT FREQUENCY: 2x/week   PT DURATION: 4 weeks   PLANNED INTERVENTIONS: Therapeutic exercises, Therapeutic activity, Neuromuscular re-education, Balance training, Gait training, Patient/Family education, Self Care, Joint mobilization, Dry Needling, Manual therapy, and Re-evaluation   PLAN FOR NEXT SESSION: DC to HEP   Lanice Shirts, PT 07/06/2022, 2:29 PM

## 2022-07-07 ENCOUNTER — Ambulatory Visit: Payer: Medicare Other

## 2022-07-07 ENCOUNTER — Ambulatory Visit (INDEPENDENT_AMBULATORY_CARE_PROVIDER_SITE_OTHER): Payer: Medicare Other | Admitting: Orthopedic Surgery

## 2022-07-07 ENCOUNTER — Encounter: Payer: Self-pay | Admitting: Orthopedic Surgery

## 2022-07-07 VITALS — BP 107/72 | HR 68 | Ht 68.0 in | Wt 188.5 lb

## 2022-07-07 DIAGNOSIS — M4722 Other spondylosis with radiculopathy, cervical region: Secondary | ICD-10-CM

## 2022-07-07 DIAGNOSIS — M79602 Pain in left arm: Secondary | ICD-10-CM

## 2022-07-07 NOTE — Progress Notes (Signed)
Orthopedic Spine Surgery Office Note  Assessment: Patient is a 72 y.o. male with neck pain that radiated into the left upper extremity consistent with cervical radiculopathy.  Had symptomatic improvement with conservative treatment   Plan: -Patient had improvement with physical therapy, so recommended he continue with the exercises he was given by physical therapy -He can use over-the-counter pain medications for his occasional neck pain -Can return to activity as tolerated -Patient should return to office on an as-needed basis   Patient expressed understanding of the plan and all questions were answered to the patient's satisfaction.   ___________________________________________________________________________   History:  Patient is a 72 y.o. male who presents today for cervical spine.  Patient had acute onset of neck pain that radiated into the left upper extremity.  It went along the lateral aspect of the arm into the dorsal aspect of the forearm and into the ulnar 2 digits.  Had no symptoms on the right side.  There is no trauma or injury that brought on the pain.  He was recommended a course of physical therapy.  He attended the sessions and his pain has significantly improved.  He is no longer having any left-sided upper extremity pain.  He occasionally has some pain in his neck and feels a popping sensation but it is not that bothersome.  Has paresthesias in his little finger on the left side.  No other numbness or paresthesias.   Weakness: denies Difficulty with fine motor skills (e.g., buttoning shirts, handwriting): denies Symptoms of imbalance: denies Paresthesias and numbness: denies Bowel or bladder incontinence: denies Saddle anesthesia: denies  Treatments tried: physical therapy, tylenol, activity modification  Review of systems: Denies fevers and chills, night sweats, unexplained weight loss, history of cancer, pain that wakes them at night  Past medical  history: Hypertension Coronary artery disease GERD Neuropathy Diabetes (last A1C was 7.3 on 03/15/2022)  Allergies: aspirin, amlodipine, contrast, oxycodone  Past surgical history:  L4/5 laminectomy Esophageal dilation Hemorrhoidectomy Coronary stent placement Right TKA Left RCR  Social history: Denies use of nicotine product (smoking, vaping, patches, smokeless) Alcohol use: 2 or 3 drinks per week Denies recreational drug use  Physical Exam:  General: no acute distress, appears stated age Neurologic: alert, answering questions appropriately, following commands Respiratory: unlabored breathing on room air, symmetric chest rise Psychiatric: appropriate affect, normal cadence to speech   MSK (spine):  -Strength exam      Left  Right Grip strength                5/5  5/5 Interosseus   5/5   5/5 Wrist extension  5/5  5/5 Wrist flexion   5/5  5/5 Elbow flexion   5/5  5/5 Deltoid    5/5  5/5  -Sensory exam   Sensation intact to light touch in C5-T1 nerve distributions of bilateral upper extremities  -Brachioradialis DTR: 1/4 on the left, 1/4 on the right -Biceps DTR: 1/4 on the left, 1/4 on the right  -Spurling: negative bilaterally -Hoffman sign: negative bilaterally -Clonus: no beats bilaterally -Interosseous wasting: none seen -Grip and release test: negative -Romberg: negative -Gait: normal -Imbalance with tandem gait: no  Left shoulder exam: no pain through range of motion Right shoulder exam: no pain through range of motion  Tinel's at wrist: negative bilaterally Durkan's: negative bilaterally  Tinel's at elbow: negative bilaterally  Imaging: XR of the cervical spine from 05/26/2022 and 07/07/2022 was independently reviewed and interpreted, showing disc height loss at C4-5, C5-6, C6-7.  Neutral alignment.  No fracture or dislocation.  No evidence of instability on flexion/extension views.   Patient name: [name] [name] Patient MRN: [id number] Date  of visit: 07/07/22

## 2022-07-08 ENCOUNTER — Ambulatory Visit: Payer: Medicare Other

## 2022-08-23 ENCOUNTER — Encounter: Payer: Self-pay | Admitting: Gastroenterology

## 2022-08-25 ENCOUNTER — Other Ambulatory Visit: Payer: Self-pay | Admitting: Family Medicine

## 2022-09-01 ENCOUNTER — Telehealth: Payer: Self-pay | Admitting: Gastroenterology

## 2022-09-01 NOTE — Telephone Encounter (Signed)
Patient is calling wondering if he needs to be seen in the office before his hospital colon, if not he would like to go ahead and schedule. Please advise

## 2022-09-02 ENCOUNTER — Other Ambulatory Visit: Payer: Self-pay

## 2022-09-02 DIAGNOSIS — D126 Benign neoplasm of colon, unspecified: Secondary | ICD-10-CM

## 2022-09-02 MED ORDER — NA SULFATE-K SULFATE-MG SULF 17.5-3.13-1.6 GM/177ML PO SOLN: 1.0000 | Freq: Once | ORAL | 0 refills | Status: AC

## 2022-09-02 NOTE — Telephone Encounter (Signed)
The pt has been scheduled for colon FTRD on 10/13/22 at 1 pm with [facility] at [facility]   The pt on Plavix letter to be sent to Dr [name]  Colon scheduled, pt instructed and medications reviewed.  Patient instructions mailed to home.  Patient to call with any questions or concerns.

## 2022-09-14 ENCOUNTER — Encounter: Payer: Self-pay | Admitting: Family Medicine

## 2022-09-14 ENCOUNTER — Ambulatory Visit (INDEPENDENT_AMBULATORY_CARE_PROVIDER_SITE_OTHER): Payer: Medicare Other | Admitting: Family Medicine

## 2022-09-14 VITALS — BP 130/82 | HR 64 | Temp 97.7°F | Ht 68.0 in | Wt 188.6 lb

## 2022-09-14 DIAGNOSIS — E1165 Type 2 diabetes mellitus with hyperglycemia: Secondary | ICD-10-CM | POA: Diagnosis not present

## 2022-09-14 DIAGNOSIS — R351 Nocturia: Secondary | ICD-10-CM | POA: Diagnosis not present

## 2022-09-14 DIAGNOSIS — E1159 Type 2 diabetes mellitus with other circulatory complications: Secondary | ICD-10-CM

## 2022-09-14 DIAGNOSIS — D509 Iron deficiency anemia, unspecified: Secondary | ICD-10-CM | POA: Diagnosis not present

## 2022-09-14 DIAGNOSIS — I152 Hypertension secondary to endocrine disorders: Secondary | ICD-10-CM

## 2022-09-14 LAB — COMPREHENSIVE METABOLIC PANEL
ALT: 17 U/L (ref 0–53)
AST: 14 U/L (ref 0–37)
Albumin: 4.2 g/dL (ref 3.5–5.2)
Alkaline Phosphatase: 94 U/L (ref 39–117)
BUN: 19 mg/dL (ref 6–23)
CO2: 27 mEq/L (ref 19–32)
Calcium: 9.6 mg/dL (ref 8.4–10.5)
Chloride: 100 mEq/L (ref 96–112)
Creatinine, Ser: 1 mg/dL (ref 0.40–1.50)
GFR: 75.12 mL/min (ref >60.00)
Glucose, Bld: 230 mg/dL — ABNORMAL HIGH (ref 70–99)
Potassium: 3.9 mEq/L (ref 3.5–5.1)
Sodium: 138 mEq/L (ref 135–145)
Total Bilirubin: 0.6 mg/dL (ref 0.2–1.2)
Total Protein: 6.8 g/dL (ref 6.0–8.3)

## 2022-09-14 LAB — CBC
HCT: 42.3 % (ref 39.0–52.0)
Hemoglobin: 14.5 g/dL (ref 13.0–17.0)
MCHC: 34.4 g/dL (ref 30.0–36.0)
MCV: 98.9 fl (ref 78.0–100.0)
Platelets: 238 10*3/uL (ref 150.0–400.0)
RBC: 4.28 Mil/uL (ref 4.22–5.81)
RDW: 13.6 % (ref 11.5–15.5)
WBC: 8.6 10*3/uL (ref 4.0–10.5)

## 2022-09-14 LAB — IBC + FERRITIN
Ferritin: 54.9 ng/mL (ref 22.0–322.0)
Iron: 86 ug/dL (ref 42–165)
Saturation Ratios: 23.7 % (ref 20.0–50.0)
TIBC: 362.6 ug/dL (ref 250.0–450.0)
Transferrin: 259 mg/dL (ref 212.0–360.0)

## 2022-09-14 LAB — POCT GLYCOSYLATED HEMOGLOBIN (HGB A1C): Hemoglobin A1C: 8.7 % — AB (ref 4.0–5.6)

## 2022-09-14 LAB — PSA: PSA: 1.84 ng/mL (ref 0.10–4.00)

## 2022-09-14 NOTE — Progress Notes (Signed)
   [name] [name] is a 72 y.o. male who presents today for an office visit.  Assessment/Plan:  Chronic Problems Addressed Today: T2DM (type 2 diabetes mellitus) (HCC) A1c elevated to 8.7 today. We dicussed adding on medications. He does not want to try any injectible medication at this point. He is also concerned about cost. We also discussed SGLT-2i such as jardiance. We discussed benefits of this including better glycemic control, weight loss, and overall reduction in CV events. We discussed potential side effects as well.   He does admit to several dietary indiscretions and would like to hold off on starting medications at this point. He will work hard on diet and exercise. He will come back in 3 months to recheck A1c. If still not at goal will add on jardiance.   Hypertension associated with diabetes ([place]) At goal today. Continue current regimen coreg 3.125mg  twice daily, HCTZ 25mg  daily,  irbesartan 300mg  daily, and spironolactone 25mg  daily.   IDA (iron deficiency anemia) Check iron panel today per patient request.     Subjective:  HPI:  See A/P for status of chronic conditions.  Patient is here today for follow-up.  We last saw him about 3 months ago.  At that time A1c was 7.3.  He was continued on metformin 1000 mg twice daily.  He has been compliant with his medications. He does admit to several dietary indiscretions.   He would also like to get blood work done today to check for his iron levels. He has not noticed any signs of GI bleeding.        Objective:  Physical Exam: BP 130/82   Pulse 64   Temp 97.7 F (36.5 C) (Temporal)   Ht 5\' 8"  (1.727 m)   Wt 188 lb 9.6 oz (85.5 kg)   SpO2 99%   BMI 28.68 kg/m   Gen: No acute distress, resting comfortably CV: Regular rate and rhythm with no murmurs appreciated Pulm: Normal work of breathing, clear to auscultation bilaterally with no crackles, wheezes, or rhonchi Neuro: Grossly normal, moves all extremities Psych: Normal  affect and thought content      [name] [name] M. [name] Pain, MD 09/14/2022 9:51 AM

## 2022-09-14 NOTE — Assessment & Plan Note (Addendum)
At goal today. Continue current regimen coreg 3.125mg  twice daily, HCTZ 25mg  daily,  irbesartan 300mg  daily, and spironolactone 25mg  daily.

## 2022-09-14 NOTE — Assessment & Plan Note (Signed)
A1c elevated to 8.7 today. We dicussed adding on medications. He does not want to try any injectible medication at this point. He is also concerned about cost. We also discussed SGLT-2i such as jardiance. We discussed benefits of this including better glycemic control, weight loss, and overall reduction in CV events. We discussed potential side effects as well.   He does admit to several dietary indiscretions and would like to hold off on starting medications at this point. He will work hard on diet and exercise. He will come back in 3 months to recheck A1c. If still not at goal will add on jardiance.

## 2022-09-14 NOTE — Assessment & Plan Note (Signed)
Check iron panel today per patient request.

## 2022-09-14 NOTE — Patient Instructions (Signed)
It was very nice to see you today!  Your A1c is too high today. Please keep working on diet and exercise today.   We will check blood work today.  Please come back in 3 months. Come back sooner if needed.   Take care, Dr [name]  PLEASE NOTE:  If you had any lab tests, please let us know if you have not heard back within a few days. You may see your results on mychart before we have a chance to review them but we will give you a call once they are reviewed by [place].   If we ordered any referrals today, please let us know if you have not heard from their office within the next week.   If you had any urgent prescriptions sent in today, please check with the pharmacy within an hour of our visit to make sure the prescription was transmitted appropriately.   Please try these tips to maintain a healthy lifestyle:  Eat at least 3 REAL meals and 1-2 snacks per day.  Aim for no more than 5 hours between eating.  If you eat breakfast, please do so within one hour of getting up.   Each meal should contain half fruits/vegetables, one quarter protein, and one quarter carbs (no bigger than a computer mouse)  Cut down on sweet beverages. This includes juice, soda, and sweet tea.   Drink at least 1 glass of water with each meal and aim for at least 8 glasses per day  Exercise at least 150 minutes every week.

## 2022-09-16 NOTE — Progress Notes (Signed)
Please inform patient of the following:  Labs are all stable compared to last time we checked.  Do not need to make any changes to treatment plan at this time.  We can recheck again 6 to 12 months.  I would like to see him back in 3 months to recheck his A1c.

## 2022-09-26 ENCOUNTER — Telehealth: Payer: Self-pay

## 2022-09-26 NOTE — Telephone Encounter (Signed)
-----   Message from [name], RN sent at 09/02/2022  2:28 PM EST ----- Make sure to have plavix hold

## 2022-09-26 NOTE — Telephone Encounter (Signed)
I have re sent the letter to Dr [name] and called the pt to inquire if he has received the plavix hold. I have resent the letter to Dr [name] and the pt will also  call.

## 2022-09-27 ENCOUNTER — Telehealth: Payer: Self-pay

## 2022-09-27 NOTE — Telephone Encounter (Signed)
-----   Message from Vivi Barrack, MD sent at 09/27/2022  1:03 PM EDT ----- It is ok for him to stop it 5 days prior to his procedure. Thanks! -CMP ----- Message ----- From: [name], RN Sent: 09/26/2022  11:48 AM EDT To: Vivi Barrack, MD  Please respond ASAP for plavix hold. Pt colon on 4/18-thank you

## 2022-09-27 NOTE — Telephone Encounter (Signed)
The pt has been advised of the plavix hold for 5 days.

## 2022-10-06 ENCOUNTER — Encounter (HOSPITAL_COMMUNITY): Payer: Self-pay | Admitting: Gastroenterology

## 2022-10-13 ENCOUNTER — Ambulatory Visit (HOSPITAL_COMMUNITY)
Admission: RE | Admit: 2022-10-13 | Discharge: 2022-10-13 | Disposition: A | Discharge status: Home / Self Care | Payer: Medicare Other | Source: Ambulatory Visit | Attending: Gastroenterology | Admitting: Gastroenterology

## 2022-10-13 ENCOUNTER — Encounter (HOSPITAL_COMMUNITY): Payer: Self-pay | Admitting: Gastroenterology

## 2022-10-13 ENCOUNTER — Encounter (HOSPITAL_COMMUNITY)
Admission: RE | Disposition: A | Discharge status: Home / Self Care | Payer: Self-pay | Source: Ambulatory Visit | Attending: Gastroenterology

## 2022-10-13 ENCOUNTER — Ambulatory Visit (HOSPITAL_BASED_OUTPATIENT_CLINIC_OR_DEPARTMENT_OTHER): Payer: Medicare Other | Admitting: Anesthesiology

## 2022-10-13 ENCOUNTER — Ambulatory Visit (HOSPITAL_COMMUNITY): Payer: Medicare Other | Admitting: Anesthesiology

## 2022-10-13 ENCOUNTER — Other Ambulatory Visit: Payer: Self-pay

## 2022-10-13 DIAGNOSIS — E1149 Type 2 diabetes mellitus with other diabetic neurological complication: Secondary | ICD-10-CM | POA: Diagnosis not present

## 2022-10-13 DIAGNOSIS — Z8719 Personal history of other diseases of the digestive system: Secondary | ICD-10-CM | POA: Insufficient documentation

## 2022-10-13 DIAGNOSIS — Z955 Presence of coronary angioplasty implant and graft: Secondary | ICD-10-CM | POA: Diagnosis not present

## 2022-10-13 DIAGNOSIS — D126 Benign neoplasm of colon, unspecified: Secondary | ICD-10-CM

## 2022-10-13 DIAGNOSIS — K644 Residual hemorrhoidal skin tags: Secondary | ICD-10-CM | POA: Diagnosis not present

## 2022-10-13 DIAGNOSIS — Z8711 Personal history of peptic ulcer disease: Secondary | ICD-10-CM | POA: Diagnosis not present

## 2022-10-13 DIAGNOSIS — Z1211 Encounter for screening for malignant neoplasm of colon: Secondary | ICD-10-CM | POA: Diagnosis not present

## 2022-10-13 DIAGNOSIS — K641 Second degree hemorrhoids: Secondary | ICD-10-CM

## 2022-10-13 DIAGNOSIS — Z86010 Personal history of colonic polyps: Secondary | ICD-10-CM | POA: Diagnosis not present

## 2022-10-13 DIAGNOSIS — D123 Benign neoplasm of transverse colon: Secondary | ICD-10-CM | POA: Diagnosis not present

## 2022-10-13 DIAGNOSIS — D122 Benign neoplasm of ascending colon: Secondary | ICD-10-CM | POA: Insufficient documentation

## 2022-10-13 DIAGNOSIS — I10 Essential (primary) hypertension: Secondary | ICD-10-CM | POA: Diagnosis not present

## 2022-10-13 DIAGNOSIS — I2510 Atherosclerotic heart disease of native coronary artery without angina pectoris: Secondary | ICD-10-CM | POA: Insufficient documentation

## 2022-10-13 DIAGNOSIS — Z8249 Family history of ischemic heart disease and other diseases of the circulatory system: Secondary | ICD-10-CM | POA: Diagnosis not present

## 2022-10-13 DIAGNOSIS — Z9889 Other specified postprocedural states: Secondary | ICD-10-CM | POA: Diagnosis not present

## 2022-10-13 HISTORY — PX: POLYPECTOMY: SHX5525

## 2022-10-13 HISTORY — PX: COLONOSCOPY WITH PROPOFOL: SHX5780

## 2022-10-13 HISTORY — PX: FULL THICKNESS RESECTION (FTRD): SHX6864

## 2022-10-13 HISTORY — PX: HEMOSTASIS CLIP PLACEMENT: SHX6857

## 2022-10-13 HISTORY — PX: SCLEROTHERAPY: SHX6841

## 2022-10-13 HISTORY — PX: HEMOSTASIS CONTROL: SHX6838

## 2022-10-13 LAB — GLUCOSE, CAPILLARY: Glucose-Capillary: 125 mg/dL — ABNORMAL HIGH (ref 70–99)

## 2022-10-13 SURGERY — COLONOSCOPY WITH PROPOFOL
Anesthesia: Monitor Anesthesia Care

## 2022-10-13 MED ORDER — SODIUM CHLORIDE (PF) 0.9 % IJ SOLN
PREFILLED_SYRINGE | INTRAMUSCULAR | Status: DC | PRN
  Administered 2022-10-13: 5 mL

## 2022-10-13 MED ORDER — SODIUM CHLORIDE 0.9 % IV SOLN: INTRAVENOUS | Status: DC

## 2022-10-13 MED ORDER — CLOPIDOGREL BISULFATE 75 MG PO TABS: 75.0000 mg | ORAL_TABLET | Freq: Every day | ORAL | 1 refills | Status: DC

## 2022-10-13 MED ORDER — OFLOXACIN 0.3 % OP SOLN: 1.0000 [drp] | Freq: Four times a day (QID) | OPHTHALMIC | 0 refills | Status: AC

## 2022-10-13 MED ORDER — PROPOFOL 1000 MG/100ML IV EMUL
INTRAVENOUS | Status: AC
  Filled 2022-10-13: qty 100

## 2022-10-13 MED ORDER — PROPOFOL 10 MG/ML IV BOLUS
INTRAVENOUS | Status: DC | PRN
  Administered 2022-10-13: 10 mg via INTRAVENOUS
  Administered 2022-10-13 (×2): 20 mg via INTRAVENOUS

## 2022-10-13 MED ORDER — LACTATED RINGERS IV SOLN: Freq: Once | INTRAVENOUS | Status: AC

## 2022-10-13 MED ORDER — LACTATED RINGERS IV SOLN: INTRAVENOUS | Status: DC | PRN

## 2022-10-13 MED ORDER — PROPOFOL 500 MG/50ML IV EMUL
INTRAVENOUS | Status: DC | PRN
  Administered 2022-10-13: 130 ug/kg/min via INTRAVENOUS

## 2022-10-13 MED ORDER — HYDRALAZINE HCL 20 MG/ML IJ SOLN
10.0000 mg | Freq: Once | INTRAMUSCULAR | Status: AC
  Administered 2022-10-13: 10 mg via INTRAVENOUS

## 2022-10-13 MED ORDER — HYDRALAZINE HCL 20 MG/ML IJ SOLN
INTRAMUSCULAR | Status: AC
  Filled 2022-10-13: qty 1

## 2022-10-13 SURGICAL SUPPLY — 22 items

## 2022-10-13 NOTE — Op Note (Signed)
[facility] Patient Name: [name] [name] Procedure Date: 10/13/2022 MRN: [id number] Attending MD: Corliss Parish , MD, [id number] Date of Birth: 08/14/1950 CSN: [id number] Age: 72 Admit Type: Outpatient Procedure:                Colonoscopy Indications:              Surveillance: Personal history of piecemeal removal                            of adenoma on last colonoscopy 6 months ago Providers:                Corliss Parish, MD, Margaree Mackintosh, RN,                            Faustina Mbumina, Technician, Harrington Challenger,                            Technician Referring MD:             Iva Boop, MD Medicines:                Monitored Anesthesia Care Complications:            No immediate complications. Estimated Blood Loss:     Estimated blood loss was minimal. Procedure:                Pre-Anesthesia Assessment:                           - Prior to the procedure, a History and Physical                            was performed, and patient medications and                            allergies were reviewed. The patient's tolerance of                            previous anesthesia was also reviewed. The risks                            and benefits of the procedure and the sedation                            options and risks were discussed with the patient.                            All questions were answered, and informed consent                            was obtained. Prior Anticoagulants: The patient has                            taken Plavix (clopidogrel), last dose was 5 days  prior to procedure. ASA Grade Assessment: III - A                            patient with severe systemic disease. After                            reviewing the risks and benefits, the patient was                            deemed in satisfactory condition to undergo the                            procedure.                           After  obtaining informed consent, the colonoscope                            was passed under direct vision. Throughout the                            procedure, the patient's blood pressure, pulse, and                            oxygen saturations were monitored continuously. The                            CF-HQ190L ([id number]) Olympus colonoscope was                            introduced through the anus and advanced to the the                            cecum, identified by appendiceal orifice and                            ileocecal valve. The colonoscopy was technically                            difficult and complex. Successful completion of the                            procedure was aided by changing the patient's                            position, using manual pressure, straightening and                            shortening the scope to obtain bowel loop reduction                            and using scope torsion. The patient tolerated the  procedure. The quality of the bowel preparation was                            adequate. The ileocecal valve, appendiceal orifice,                            and rectum were photographed. Scope In: 1:57:40 PM Scope Out: 3:25:39 PM Scope Withdrawal Time: 1 hour 23 minutes 12 seconds  Total Procedure Duration: 1 hour 27 minutes 59 seconds  Findings:      The digital rectal exam findings include hemorrhoids. Pertinent       negatives include no palpable rectal lesions.      A moderate amount of semi-liquid stool was found in the entire colon,       interfering with visualization. Lavage of the area was performed using       copious amounts, resulting in clearance with adequate visualization.      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       There was evidence of unfortunate recurrent polypoid tissue.       Preparations were made for mucosal resection. Demarcation of the lesion       was  performed with high-definition white light and narrow band imaging       to clearly identify the boundaries of the lesion. Water immersion left       mucosal resection was attempted was injected with partial lift of the       lesion from the muscularis propria. Piecemeal mucosal resection using a       snare with suction (via the working channel) retrieval was performed,       more so to debride the lesion further in an effort of trying to then       allow and FTRD. A 45 mm area was resected. Resection was incomplete. The       resected tissue was retrieved. I placed a OVESCO prove it But       unfortunately even though I could get to the area, due to fibrosis I       could not grasp the recurrent polyp adequately into the cap to pursue       FTRD. I subsequently transition to Endorotor to perform powered       endoscopic resection/dissection. I placed 5 mL of 1:100,000 epinephrine       into the lesion base. Subsequently I used the Endorotor to remove tissue       to the deep scar. To prevent bleeding after mucosal resection, two       hemostatic clips were successfully placed (MR conditional). Clip       manufacturer: [employer]. However I could not close the defect       completely. Subsequently, the area was successfully injected with 3 mL       PuraStat for additional hemostasis.      Two sessile polyps were found in the transverse colon. The polyps were 3       to 6 mm in size. These polyps were removed with a cold snare. Resection       and retrieval were complete.      Non-bleeding non-thrombosed external and internal hemorrhoids were found       during retroflexion, during perianal exam and during digital exam. The  hemorrhoids were Grade II (internal hemorrhoids that prolapse but reduce       spontaneously). Impression:               - Hemorrhoids found on digital rectal exam.                           - Stool in the entire examined colon.                           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 Underwater EMR was only                            partially successful. The rest of the lesion did                            not lift and we attempted an OVESCO prove it But                            using the grasper we were not able to grasp the                            lesion into the Due to fibrosis. Subsequently                            decision was made to pursue Endorotor powered                            endoscopic resection in an effort of trying to                            remove the lesion further. 2 clips (MR conditional)                            were placed but we could not close the defect                            completely. Purastat placed for additional                            hemostasis                           - Two 3 to 6 mm polyps in the transverse colon,                            removed with a cold snare. Resected and retrieved.                           -  Non-bleeding non-thrombosed external and internal                            hemorrhoids. Moderate Sedation:      Not Applicable - Patient had care per Anesthesia. Recommendation:           - The patient will be observed post-procedure,                            until all discharge criteria are met.                           - Discharge patient to home.                           - Patient has a contact number available for                            emergencies. The signs and symptoms of potential                            delayed complications were discussed with the                            patient. Return to normal activities tomorrow.                            Written discharge instructions were provided to the                            patient.                           - High fiber diet.                            - Use FiberCon 1-2 tablets PO daily.                           - May restart Plavix in 48 hours to decrease risk                            of post interventional bleeding risk.                           - Continue present medications.                           - Await pathology results.                           - Repeat colonoscopy for surveillance based on                            pathology results.                           -  Discussion with patient further about the role of                            consideration of colorectal surgery evaluation                            before next colonoscopy to be prepared for                            potential needs of surgery should patient have                            recurrence once again.                           - The findings and recommendations were discussed                            with the patient.                           - The findings and recommendations were discussed                            with the patient's family. Procedure Code(s):        --- Professional ---                           [id number], Colonoscopy, flexible; with endoscopic                            mucosal resection                           45385, 59, Colonoscopy, flexible; with removal of                            tumor(s), polyp(s), or other lesion(s) by snare                            technique Diagnosis Code(s):        --- Professional ---                           K64.1, Second degree hemorrhoids                           Z98.890, Other specified postprocedural states                           D12.3, Benign neoplasm of transverse colon (hepatic                            flexure or splenic flexure)                           Z09, Encounter for follow-up examination  after                            completed treatment for conditions other than                            malignant neoplasm                           Z86.010, Personal history of  colonic polyps CPT copyright 2022 [employer]. All rights reserved. The codes documented in this report are preliminary and upon coder review may  be revised to meet current compliance requirements. Corliss Parish, MD 10/13/2022 3:51:55 PM Number of Addenda: 0

## 2022-10-13 NOTE — Discharge Instructions (Signed)
YOU HAD AN ENDOSCOPIC PROCEDURE TODAY: Refer to the procedure report and other information in the discharge instructions given to you for any specific questions about what was found during the examination. If this information does not answer your questions, please call [place] office at [phone] to clarify.  ° °YOU SHOULD EXPECT: Some feelings of bloating in the abdomen. Passage of more gas than usual. Walking can help get rid of the air that was put into your GI tract during the procedure and reduce the bloating. If you had a lower endoscopy (such as a colonoscopy or flexible sigmoidoscopy) you may notice spotting of blood in your stool or on the toilet paper. Some abdominal soreness may be present for a day or two, also. ° °DIET: Your first meal following the procedure should be a light meal and then it is ok to progress to your normal diet. A half-sandwich or bowl of soup is an example of a good first meal. Heavy or fried foods are harder to digest and may make you feel nauseous or bloated. Drink plenty of fluids but you should avoid alcoholic beverages for 24 hours. If you had a esophageal dilation, please see attached instructions for diet.   ° °ACTIVITY: Your care partner should take you home directly after the procedure. You should plan to take it easy, moving slowly for the rest of the day. You can resume normal activity the day after the procedure however YOU SHOULD NOT DRIVE, use power tools, machinery or perform tasks that involve climbing or major physical exertion for 24 hours (because of the sedation medicines used during the test).  ° °SYMPTOMS TO REPORT IMMEDIATELY: °A gastroenterologist can be reached at any hour. Please call [phone]  for any of the following symptoms:  °Following lower endoscopy (colonoscopy, flexible sigmoidoscopy) °Excessive amounts of blood in the stool  °Significant tenderness, worsening of abdominal pains  °Swelling of the abdomen that is new, acute  °Fever of 100° or  higher  °Following upper endoscopy (EGD, EUS, ERCP, esophageal dilation) °Vomiting of blood or coffee ground material  °New, significant abdominal pain  °New, significant chest pain or pain under the shoulder blades  °Painful or persistently difficult swallowing  °New shortness of breath  °Black, tarry-looking or red, bloody stools ° °FOLLOW UP:  °If any biopsies were taken you will be contacted by phone or by letter within the next 1-3 weeks. Call [phone]  if you have not heard about the biopsies in 3 weeks.  °Please also call with any specific questions about appointments or follow up tests. ° °

## 2022-10-13 NOTE — Transfer of Care (Signed)
Immediate Anesthesia Transfer of Care Note  Patient: [name] [name]  Procedure(s) Performed: COLONOSCOPY WITH PROPOFOL FULL THICKNESS RESECTION (FTRD) POLYPECTOMY HEMOSTASIS CLIP PLACEMENT HEMOSTASIS CONTROL SCLEROTHERAPY  Patient Location: PACU  Anesthesia Type:MAC combined with regional for post-op pain  Level of Consciousness: awake and alert   Airway & Oxygen Therapy: Patient Spontanous Breathing and Patient connected to face mask oxygen  Post-op Assessment: Report given to RN and Post -op Vital signs reviewed and stable  Post vital signs: Reviewed and stable  Last Vitals:  Vitals Value Taken Time  BP 171/128 10/13/22 1533  Temp    Pulse 49 10/13/22 1536  Resp 11 10/13/22 1536  SpO2 100 % 10/13/22 1536  Vitals shown include unvalidated device data.  Last Pain:  Vitals:   10/13/22 1207  TempSrc: Temporal  PainSc: 0-No pain         Complications: No notable events documented.

## 2022-10-13 NOTE — Anesthesia Preprocedure Evaluation (Addendum)
Anesthesia Evaluation  Patient identified by MRN, date of birth, ID band Patient awake    Reviewed: Allergy & Precautions, NPO status , Patient's Chart, lab work & pertinent test results  History of Anesthesia Complications (+) Family history of anesthesia reactionNegative for: history of anesthetic complications  Airway Mallampati: III  TM Distance: >3 FB Neck ROM: Full    Dental  (+) Teeth Intact, Dental Advisory Given   Pulmonary neg pulmonary ROS, neg shortness of breath, neg sleep apnea, neg COPD, neg recent URI   breath sounds clear to auscultation       Cardiovascular hypertension, Pt. on medications and Pt. on home beta blockers + CAD and + Cardiac Stents  (-) CHF  Rhythm:Regular  2022 stress: ? The left ventricular ejection fraction is mildly decreased (45-54%). ? Nuclear stress EF: 49%. Mid anterior wall hypokinesis. ? There was no ST segment deviation noted during stress. ? Defect 1: There is a medium defect of severe severity present in the mid anterior location. ? Findings consistent with prior myocardial infarction. ? This is an intermediate risk study. Old infarct pattern noted in the mid inferior wall distribution. Prior LAD stent placed. No ischemia identified.   Donato Schultz, MD     Neuro/Psych  Neuromuscular disease  negative psych ROS   GI/Hepatic Neg liver ROS, hiatal hernia, PUD,GERD  Medicated,,Colon adenoma   Endo/Other  diabetes, Well Controlled, Type 2  Hyperlipidemia   Renal/GU Renal disease  negative genitourinary   Musculoskeletal negative musculoskeletal ROS (+)    Abdominal   Peds  Hematology  (+) Blood dyscrasia, anemia   Anesthesia Other Findings   Reproductive/Obstetrics                             Anesthesia Physical Anesthesia Plan  ASA: 2  Anesthesia Plan: MAC   Post-op Pain Management: Minimal or no pain anticipated   Induction:  Intravenous  PONV Risk Score and Plan: 1 and Propofol infusion and Treatment may vary due to age or medical condition  Airway Management Planned: Nasal Cannula and Natural Airway  Additional Equipment:   Intra-op Plan:   Post-operative Plan:   Informed Consent: I have reviewed the patients History and Physical, chart, labs and discussed the procedure including the risks, benefits and alternatives for the proposed anesthesia with the patient or authorized representative who has indicated his/her understanding and acceptance.     Dental advisory given  Plan Discussed with: CRNA and Anesthesiologist  Anesthesia Plan Comments:         Anesthesia Quick Evaluation

## 2022-10-13 NOTE — Anesthesia Postprocedure Evaluation (Signed)
Anesthesia Post Note  Patient: [name] [name]  Procedure(s) Performed: COLONOSCOPY WITH PROPOFOL FULL THICKNESS RESECTION (FTRD) POLYPECTOMY HEMOSTASIS CLIP PLACEMENT HEMOSTASIS CONTROL SCLEROTHERAPY     Patient location during evaluation: PACU Anesthesia Type: MAC Level of consciousness: awake and alert Pain management: pain level controlled Vital Signs Assessment: post-procedure vital signs reviewed and stable Respiratory status: spontaneous breathing, nonlabored ventilation, respiratory function stable and patient connected to nasal cannula oxygen Cardiovascular status: stable and blood pressure returned to baseline Postop Assessment: no apparent nausea or vomiting Anesthetic complications: no   No notable events documented.  Last Vitals:  Vitals:   10/13/22 1630 10/13/22 1635  BP: (!) 167/90   Pulse:  (!) 54  Resp: 14 13  Temp:    SpO2:  100%    Last Pain:  Vitals:   10/13/22 1540  TempSrc: Axillary  PainSc: 0-No pain                 Marilla Boddy S

## 2022-10-13 NOTE — H&P (Signed)
GASTROENTEROLOGY PROCEDURE H&P NOTE   Primary Care Physician: Ardith Dark, MD  HPI: [name] [name] is a 72 y.o. male who presents for Colonoscopy for follow up of TA/TVA with HGD and recurrence.  Past Medical History:  Diagnosis Date   Adenomatous colon polyp 09/07/2004   Amblyopia    CAD (coronary artery disease)    cardiac CT with a 50% LAD lesion in 2007   Diverticulosis    DM2 (diabetes mellitus, type 2)    Elevated LFTs    secondary to labetalol   Esophageal stricture 2003   External hemorrhoids 2012   lanced by Dr Zachery Dakins   Family history of anesthesia complication    "sister had PONV"   GERD (gastroesophageal reflux disease)    esophageal stricture, pmh   Hiatal hernia 01/04/2000   History of kidney stones 2022   HLD (hyperlipidemia)    HTN (hypertension)    Hypopotassemia    Past Surgical History:  Procedure Laterality Date   BIOPSY  06/05/2021   Procedure: BIOPSY;  Surgeon: Iva Boop, MD;  Location: [facility] ENDOSCOPY;  Service: Endoscopy;;   BIOPSY  09/02/2021   Procedure: BIOPSY;  Surgeon: Lemar Lofty., MD;  Location: [facility] ENDOSCOPY;  Service: Gastroenterology;;   COLONOSCOPY  06/27/2004   tics , ADENOMATOUS polyps. F/U due 2011   COLONOSCOPY  06/27/2010   Tics   COLONOSCOPY WITH PROPOFOL N/A 09/02/2021   Procedure: COLONOSCOPY WITH PROPOFOL;  Surgeon: Meridee Score Netty Starring., MD;  Location: [facility] ENDOSCOPY;  Service: Gastroenterology;  Laterality: N/A;   COLONOSCOPY WITH PROPOFOL N/A 04/14/2022   Procedure: COLONOSCOPY WITH PROPOFOL;  Surgeon: Meridee Score Netty Starring., MD;  Location: [facility] ENDOSCOPY;  Service: Gastroenterology;  Laterality: N/A;   CORONARY ANGIOPLASTY WITH STENT PLACEMENT  04/01/2014   "2"   ENDOSCOPIC MUCOSAL RESECTION  09/02/2021   Procedure: ENDOSCOPIC MUCOSAL RESECTION;  Surgeon: Meridee Score Netty Starring., MD;  Location: [facility] ENDOSCOPY;  Service: Gastroenterology;;   ENDOSCOPIC MUCOSAL RESECTION N/A 04/14/2022   Procedure:  ENDOSCOPIC MUCOSAL RESECTION;  Surgeon: Lemar Lofty., MD;  Location: [facility] ENDOSCOPY;  Service: Gastroenterology;  Laterality: N/A;   ESOPHAGEAL BRUSHING  04/14/2022   Procedure: ESOPHAGEAL BRUSHING;  Surgeon: Lemar Lofty., MD;  Location: [facility] ENDOSCOPY;  Service: Gastroenterology;;   ESOPHAGEAL DILATION  06/27/2001   ESOPHAGOGASTRODUODENOSCOPY  06/27/2001   ERD, stricture dilated   ESOPHAGOGASTRODUODENOSCOPY (EGD) WITH PROPOFOL N/A 06/05/2021   Procedure: ESOPHAGOGASTRODUODENOSCOPY (EGD) WITH PROPOFOL;  Surgeon: Iva Boop, MD;  Location: [facility] ENDOSCOPY;  Service: Endoscopy;  Laterality: N/A;   ESOPHAGOGASTRODUODENOSCOPY (EGD) WITH PROPOFOL N/A 09/02/2021   Procedure: ESOPHAGOGASTRODUODENOSCOPY (EGD) WITH PROPOFOL;  Surgeon: Meridee Score Netty Starring., MD;  Location: [facility] ENDOSCOPY;  Service: Gastroenterology;  Laterality: N/A;   ESOPHAGOGASTRODUODENOSCOPY (EGD) WITH PROPOFOL N/A 04/14/2022   Procedure: ESOPHAGOGASTRODUODENOSCOPY (EGD) WITH PROPOFOL;  Surgeon: Meridee Score Netty Starring., MD;  Location: [facility] ENDOSCOPY;  Service: Gastroenterology;  Laterality: N/A;   EXCISIONAL HEMORRHOIDECTOMY  ~ 2008   HEMOSTASIS CLIP PLACEMENT  09/02/2021   Procedure: HEMOSTASIS CLIP PLACEMENT;  Surgeon: Meridee Score Netty Starring., MD;  Location: [facility] ENDOSCOPY;  Service: Gastroenterology;;   HEMOSTASIS CONTROL  04/14/2022   Procedure: HEMOSTASIS CONTROL;  Surgeon: Lemar Lofty., MD;  Location: [facility] ENDOSCOPY;  Service: Gastroenterology;;   HOT HEMOSTASIS N/A 09/02/2021   Procedure: HOT HEMOSTASIS (ARGON PLASMA COAGULATION/BICAP);  Surgeon: Lemar Lofty., MD;  Location: [facility] ENDOSCOPY;  Service: Gastroenterology;  Laterality: N/A;   HOT HEMOSTASIS N/A 04/14/2022   Procedure: HOT HEMOSTASIS (ARGON PLASMA COAGULATION/BICAP);  Surgeon: Corliss Parish  Montez Hageman., MD;  Location: [facility] ENDOSCOPY;  Service: Gastroenterology;  Laterality: N/A;   LEFT HEART CATHETERIZATION WITH CORONARY ANGIOGRAM N/A  03/26/2014   Procedure: LEFT HEART CATHETERIZATION WITH CORONARY ANGIOGRAM;  Surgeon: Peter M Swaziland, MD;  Location: [facility] CATH LAB;  Service: Cardiovascular;  Laterality: N/A;   PERCUTANEOUS CORONARY STENT INTERVENTION (PCI-S) N/A 04/01/2014   Procedure: PERCUTANEOUS CORONARY STENT INTERVENTION (PCI-S);  Surgeon: Peter M Swaziland, MD;  Location: [facility] CATH LAB;  Service: Cardiovascular;  Laterality: N/A;   POSTERIOR LAMINECTOMY / DECOMPRESSION LUMBAR SPINE  12/25/2009   L4 , Dr Newell Coral   SHOULDER ARTHROSCOPY WITH ROTATOR CUFF REPAIR AND OPEN BICEPS TENODESIS Left 02/04/2019   Procedure: LEFT SHOULDER ARTHROSCOPY, BICEPS TENODESIS, MINI OPEN ROTATOR CUFF TEAR REPAIR;  Surgeon: Cammy Copa, MD;  Location: [facility];  Service: Orthopedics;  Laterality: Left;   SUBMUCOSAL INJECTION  09/02/2021   Procedure: SUBMUCOSAL INJECTION;  Surgeon: Meridee Score Netty Starring., MD;  Location: [facility] ENDOSCOPY;  Service: Gastroenterology;;   SUBMUCOSAL LIFTING INJECTION  04/14/2022   Procedure: SUBMUCOSAL LIFTING INJECTION;  Surgeon: Lemar Lofty., MD;  Location: [facility] ENDOSCOPY;  Service: Gastroenterology;;   TOTAL KNEE ARTHROPLASTY Right 05/11/2021   Procedure: TOTAL KNEE ARTHROPLASTY;  Surgeon: Durene Romans, MD;  Location: [facility] ORS;  Service: Orthopedics;  Laterality: Right;   No current facility-administered medications for this encounter.   No current facility-administered medications for this encounter. Allergies  Allergen Reactions   Aspirin Hives, Swelling and Other (See Comments)    Angioedema   Oxycodone Nausea Only   Contrast Media [Iodinated Contrast Media] Hives, Itching, Rash and Other (See Comments)    Developed hives despite pre-treatment   Norvasc [Amlodipine Besylate] Other (See Comments) and Cough    09/12/2013 gingival hyperplasia , Dr Lucky Cowboy ,DDS   Family History  Problem Relation Age of Onset   Heart attack Mother 75   Stroke Mother 70   Prostate cancer Father     Diabetes Father    Heart disease Father 11       CBAG    Heart disease Sister    Diabetes Brother    Heart attack Brother    Breast cancer Maternal Aunt    Heart attack Maternal Grandfather 57   Diabetes Paternal Grandmother    Stroke Paternal Grandfather 53   Stomach cancer Neg Hx    Rectal cancer Neg Hx    Esophageal cancer Neg Hx    Colon cancer Neg Hx    Inflammatory bowel disease Neg Hx    Liver disease Neg Hx    Pancreatic cancer Neg Hx    Social History   Socioeconomic History   Marital status: Single    Spouse name: Not on file   Number of children: 0   Years of education: 16   Highest education level: Not on file  Occupational History   Occupation: Retired   Tobacco Use   Smoking status: Never   Smokeless tobacco: Never  Vaping Use   Vaping Use: Never used  Substance and Sexual Activity   Alcohol use: Yes    Alcohol/week: 2.0 standard drinks of alcohol    Types: 2 Cans of beer per week    Comment: social   Drug use: No   Sexual activity: Not Currently  Other Topics Concern   Not on file  Social History Narrative   Fun: Water quality scientist.    Social Determinants of Health   Financial Resource Strain: Low Risk  (03/24/2022)   Overall Financial Resource  Strain (CARDIA)    Difficulty of Paying Living Expenses: Not hard at all  Food Insecurity: No Food Insecurity (03/24/2022)   Hunger Vital Sign    Worried About Running Out of Food in the Last Year: Never true    Ran Out of Food in the Last Year: Never true  Transportation Needs: No Transportation Needs (03/24/2022)   PRAPARE - Administrator, Civil Service (Medical): No    Lack of Transportation (Non-Medical): No  Physical Activity: Sufficiently Active (03/24/2022)   Exercise Vital Sign    Days of Exercise per Week: 3 days    Minutes of Exercise per Session: 60 min  Stress: No Stress Concern Present (03/24/2022)   [employer] of Occupational Health - Occupational Stress Questionnaire     Feeling of Stress : Not at all  Social Connections: Socially Isolated (03/24/2022)   Social Connection and Isolation Panel [NHANES]    Frequency of Communication with Friends and Family: More than three times a week    Frequency of Social Gatherings with Friends and Family: More than three times a week    Attends Religious Services: Never    Database administrator or Organizations: No    Attends Banker Meetings: Never    Marital Status: Never married  Intimate Partner Violence: Not At Risk (03/24/2022)   Humiliation, Afraid, Rape, and Kick questionnaire    Fear of Current or Ex-Partner: No    Emotionally Abused: No    Physically Abused: No    Sexually Abused: No    Physical Exam: Today's Vitals   10/06/22 1304 10/13/22 1207  BP:  (!) 148/80  Pulse:  61  Resp:  11  Temp:  98.2 F (36.8 C)  TempSrc:  Temporal  SpO2:  99%  Weight: 85.5 kg 80.3 kg  PainSc:  0-No pain   Body mass index is 26.91 kg/m. GEN: NAD EYE: Sclerae anicteric ENT: MMM CV: Non-tachycardic GI: Soft, NT/ND NEURO:  Alert & Oriented x 3  Lab Results: No results for input(s): "WBC", "HGB", "HCT", "PLT" in the last 72 hours. BMET No results for input(s): "NA", "K", "CL", "CO2", "GLUCOSE", "BUN", "CREATININE", "CALCIUM" in the last 72 hours. LFT No results for input(s): "PROT", "ALBUMIN", "AST", "ALT", "ALKPHOS", "BILITOT", "BILIDIR", "IBILI" in the last 72 hours. PT/INR No results for input(s): "LABPROT", "INR" in the last 72 hours.   Impression / Plan: This is a 72 y.o.male who presents for Colonoscopy for follow up of TA/TVA with HGD and recurrence.  The risks and benefits of endoscopic evaluation/treatment were discussed with the patient and/or family; these include but are not limited to the risk of perforation, infection, bleeding, missed lesions, lack of diagnosis, severe illness requiring hospitalization, as well as anesthesia and sedation related illnesses.  The patient's history has  been reviewed, patient examined, no change in status, and deemed stable for procedure.  The patient and/or family is agreeable to proceed.    Corliss Parish, MD [facility] Advanced Endoscopy Office # [phone]

## 2022-10-17 ENCOUNTER — Telehealth: Payer: Self-pay

## 2022-10-17 ENCOUNTER — Encounter (HOSPITAL_COMMUNITY): Payer: Self-pay | Admitting: Gastroenterology

## 2022-10-17 ENCOUNTER — Encounter: Payer: Self-pay | Admitting: Gastroenterology

## 2022-10-17 LAB — SURGICAL PATHOLOGY

## 2022-10-17 NOTE — Telephone Encounter (Signed)
Mansouraty, Netty Starring., MD  Loretha Stapler, RN Please send letter. Please try to reach out to the patient later this week if he does not call back sooner. Tentative colonoscopy recall 3 months. If he gives the okay, then we will place a referral to colorectal surgery. Please keep me up-to-date on the telephone message. Thanks. GM

## 2022-10-18 NOTE — Telephone Encounter (Signed)
The pt returned call and has agreed to the referral to [facility].  Records faxed. Recall colon has been entered for 3 months.

## 2022-10-18 NOTE — Telephone Encounter (Signed)
Patient returned call, stated he was interested in placing referral to surgery. Would like to discuss next steps with nurse or Dr. Meridee Score.

## 2022-11-14 ENCOUNTER — Ambulatory Visit: Payer: Self-pay | Admitting: Surgery

## 2022-11-14 ENCOUNTER — Telehealth: Payer: Self-pay | Admitting: Gastroenterology

## 2022-11-14 DIAGNOSIS — Z7901 Long term (current) use of anticoagulants: Secondary | ICD-10-CM | POA: Diagnosis not present

## 2022-11-14 DIAGNOSIS — D122 Benign neoplasm of ascending colon: Secondary | ICD-10-CM | POA: Diagnosis not present

## 2022-11-14 DIAGNOSIS — K429 Umbilical hernia without obstruction or gangrene: Secondary | ICD-10-CM | POA: Diagnosis not present

## 2022-11-14 DIAGNOSIS — M6208 Separation of muscle (nontraumatic), other site: Secondary | ICD-10-CM | POA: Diagnosis not present

## 2022-11-14 NOTE — Telephone Encounter (Signed)
Patient referred to colorectal surgery to discuss potential role of surgery if polyp which we have worked on on 3 separate occasions continues to recur. He has been evaluated by Dr. Michaell Cowing who is willing to consider surgical resection. The question is whether patient feels comfortable moving forward with surgery versus 1 last colonoscopy to evaluate the site. I tried calling the patient but was not able to reach him. I will send him a MyChart message since it looks like he does review his MyChart. In either case I am happy to assist with repeat colonoscopy or if he is ready to move forward with surgery that is okay too.   Corliss Parish, MD [facility] Advanced Endoscopy Office # [phone]

## 2022-11-15 ENCOUNTER — Telehealth: Payer: Self-pay

## 2022-11-15 NOTE — Telephone Encounter (Signed)
   Name: [name] [name]  DOB: 1951-03-26  MRN: [id number]  Primary Cardiologist: None  Chart reviewed as part of pre-operative protocol coverage. Because of [name] [name]'s past medical history and time since last visit, he will require a follow-up in-office visit in order to better assess preoperative cardiovascular risk.  Pre-op covering staff: - Please schedule appointment and call patient to inform them. If patient already had an upcoming appointment within acceptable timeframe, please add "pre-op clearance" to the appointment notes so provider is aware. - Please contact requesting surgeon's office via preferred method (i.e, phone, fax) to inform them of need for appointment prior to surgery.  Per office protocol, if patient is without any new symptoms or concerns at the time of their visit, he may hold Plavix for 5 days prior to procedure. Please resume Plavix as soon as possible postprocedure, at the discretion of the surgeon.    Joylene Grapes, NP  11/15/2022, 11:45 AM

## 2022-11-15 NOTE — Telephone Encounter (Signed)
   Pre-operative Risk Assessment    Patient Name: [name] [name]  DOB: [birth year] MRN: [id number]      Request for Surgical Clearance    Procedure:   ROBOTIC COLECTOMY   Date of Surgery:  Clearance TBD                                 Surgeon:  DR. Karie Soda Surgeon's Group or Practice Name:  [facility]  Phone number:  [phone] Fax number:  [phone]   Type of Clearance Requested:   - Pharmacy:  Hold Clopidogrel (Plavix) NEEDS HOLD INSTRUCTIONS    Type of Anesthesia:  General    Additional requests/questions:    SignedMichaelle Copas   11/15/2022, 10:18 AM

## 2022-11-15 NOTE — Telephone Encounter (Signed)
Spoke with patient who is agreeable to see Azalee Course, PA-C in office on 5/29 at 8:50 am. I will fax updates to requesting provider's office.

## 2022-11-16 NOTE — Telephone Encounter (Signed)
The pt has been advised via My Chart and recall entered

## 2022-11-16 NOTE — Telephone Encounter (Signed)
Patty, Please let the patient know that I am okay with this. Pending the patient does well with his surgery I would recommend a 3-year follow-up colonoscopy. The recall can be transition to 3 years. Thanks. GM

## 2022-11-23 ENCOUNTER — Other Ambulatory Visit: Payer: Self-pay | Admitting: Family Medicine

## 2022-11-23 ENCOUNTER — Encounter: Payer: Self-pay | Admitting: Physician Assistant

## 2022-11-23 ENCOUNTER — Ambulatory Visit: Payer: Medicare Other | Attending: Physician Assistant | Admitting: Physician Assistant

## 2022-11-23 VITALS — BP 140/68 | HR 51 | Ht 68.5 in | Wt 184.4 lb

## 2022-11-23 DIAGNOSIS — E785 Hyperlipidemia, unspecified: Secondary | ICD-10-CM | POA: Insufficient documentation

## 2022-11-23 DIAGNOSIS — Z01818 Encounter for other preprocedural examination: Secondary | ICD-10-CM | POA: Insufficient documentation

## 2022-11-23 DIAGNOSIS — E119 Type 2 diabetes mellitus without complications: Secondary | ICD-10-CM | POA: Diagnosis not present

## 2022-11-23 DIAGNOSIS — I25118 Atherosclerotic heart disease of native coronary artery with other forms of angina pectoris: Secondary | ICD-10-CM | POA: Diagnosis not present

## 2022-11-23 DIAGNOSIS — I10 Essential (primary) hypertension: Secondary | ICD-10-CM | POA: Insufficient documentation

## 2022-11-23 DIAGNOSIS — Z7984 Long term (current) use of oral hypoglycemic drugs: Secondary | ICD-10-CM

## 2022-11-23 NOTE — Telephone Encounter (Signed)
   Patient Name: [name] [name]  DOB: 03/06/1951 MRN: [id number]  Primary Cardiologist: Peter Swaziland, MD  Chart reviewed as part of pre-operative protocol coverage.   Patient was seen in the cardiology office on 11/23/2022 for preop clearance prior to robotic colectomy surgery.  His last nuclear stress test was in March 2022 prior to his knee surgery.  Talking with the patient, he has been able to ambulate for 1 to 2 miles recently without any exertional chest discomfort.  He is clearly able to accomplish more than 4 METS of activity.  Given past medical history and time since last visit, based on ACC/AHA guidelines, [name] [name] is at acceptable risk for the planned procedure without further cardiovascular testing.  Patient may hold Plavix for 5 to 7 days prior to the surgery and restart as soon as possible afterward at the surgeon's discretion.  His RCRI perioperative risk is class II, and 0.9% risk of major cardiac event.  The patient was advised that if he develops new symptoms prior to surgery to contact our office to arrange for a follow-up visit, and he verbalized understanding.  I will route this recommendation to the requesting party via Epic fax function and remove from pre-op pool.  Please call with questions.  [place], [place] 11/23/2022, 9:07 AM

## 2022-11-23 NOTE — Patient Instructions (Addendum)
Medication Instructions:   No changes  *If you need a refill on your cardiac medications before your next appointment, please call your pharmacy*   Lab Work: Not needed    Testing/Procedures: Not needed   Follow-Up: At [facility], you and your health needs are our priority.  As part of our continuing mission to provide you with exceptional heart care, we have created designated Provider Care Teams.  These Care Teams include your primary Cardiologist (physician) and Advanced Practice Providers (APPs -  Physician Assistants and Nurse Practitioners) who all work together to provide you with the care you need, when you need it.     Your next appointment:   6 month(s)  The format for your next appointment:   In Person  Provider:   Peter Swaziland, MD    Other Instructions   You are cleared for  surgery by Dr Gordy Savers. You can hold Clopidogrel 5 to 7 days  prior to surgery  and restart at the discretion of the surgeon

## 2022-11-23 NOTE — Progress Notes (Signed)
Patient dropped his HCTZ bottle in water therefore has not taken HCTZ, he will get refill of HCTZ soon

## 2022-11-23 NOTE — Progress Notes (Signed)
Cardiology Office Note:    Date:  11/23/2022   ID:  [name] [name], DOB Aug 28, 1950, MRN [id number]  PCP:  Ardith Dark, MD    [facility] Providers Cardiologist:  Peter Swaziland, MD     Referring MD: Ardith Dark, MD   Chief Complaint  Patient presents with   Follow-up    Seen for Dr. Swaziland    History of Present Illness:    [name] [name] is a 72 y.o. male with a hx of CAD, hypertension, hyperlipidemia, and DM2.  He was initially diagnosed with CAD with cardiac CTA in 2007.  He was admitted in September 2015 with chest pain, subsequent Myoview demonstrated anterior wall ischemia.  Cardiac catheterization demonstrated severe mid LAD and D1 disease, 50 to 70% disease in OM 2.  He underwent successful stenting of LAD and the diagonal using drug-eluting stent on 04/02/2014.  FFR of OM1 was normal.  Despite aggressive pretreatment for contrast dye allergy with steroid, Pepcid and Benadryl, he still developed hives and diarrhea postprocedure.  He was seen in 2022 for preop clearance prior to knee surgery, Myoview was low risk with some anterior wall scar but no ischemia, EF 49%.  EGD performed in December 2022 showed clean-based ulcer, Plavix was held and that he was treated with PPI.  Repeat EGD in March 2023 showed a bleeding angiodysplastic lesion that was treated, clean-based duodenal ulcer and some duodenitis.  He also had a colonoscopy with removal of one large 60 mm nodule and 2 small sessile polyps.  He was last seen by Dr. Swaziland in December 2023 at which time he was doing well from the cardiac perspective.  He is on long-term Plavix monotherapy since he is allergic to aspirin.  He recently underwent colonoscopy procedure in April 2024, this demonstrated recurrent polyps.  He was seen by [facility] surgery who plan to proceed with a robotic colectomy.  Patient presents today for preoperative clearance prior to robotic right colectomy surgery by Dr. Karie Soda of  [facility] surgery.  He just returned from the beach and he was able to walk 1 to 2 miles without any exertional chest pain or worsening dyspnea.  Overall, he is doing well from the cardiac perspective.  Blood pressure on initial arrival was elevated, on repeat manual recheck, his blood pressure came down to 140/68.  He says he has not been able to take his hydrochlorothiazide for the past 10 days as he accidentally dropped the bottle into water.  I offered to send him a another prescription with he declined, he says he will pick up the next prescription in early June.  He is clearly able to accomplish more than 4 METS of activity.  His RCRI perioperative risk is class II, 0.9% risk of major cardiac event.  He is at acceptable risk to proceed with upcoming surgery from the cardiac perspective without further workup.  He can follow-up with Dr. Orinda Kenner in 6 months.   Past Medical History:  Diagnosis Date   Adenomatous colon polyp 09/07/2004   Amblyopia    CAD (coronary artery disease)    cardiac CT with a 50% LAD lesion in 2007   Diverticulosis    DM2 (diabetes mellitus, type 2) (HCC)    Elevated LFTs    secondary to labetalol   Esophageal stricture 2003   External hemorrhoids 2012   lanced by Dr Zachery Dakins   Family history of anesthesia complication    "sister had PONV"   GERD (  gastroesophageal reflux disease)    esophageal stricture, pmh   Hiatal hernia 01/04/2000   History of kidney stones 2022   HLD (hyperlipidemia)    HTN (hypertension)    Hypopotassemia     Past Surgical History:  Procedure Laterality Date   BIOPSY  06/05/2021   Procedure: BIOPSY;  Surgeon: Iva Boop, MD;  Location: [facility] ENDOSCOPY;  Service: Endoscopy;;   BIOPSY  09/02/2021   Procedure: BIOPSY;  Surgeon: Lemar Lofty., MD;  Location: [facility] ENDOSCOPY;  Service: Gastroenterology;;   COLONOSCOPY  06/27/2004   tics , ADENOMATOUS polyps. F/U due 2011   COLONOSCOPY  06/27/2010   Tics   COLONOSCOPY WITH  PROPOFOL N/A 09/02/2021   Procedure: COLONOSCOPY WITH PROPOFOL;  Surgeon: Meridee Score Netty Starring., MD;  Location: [name] ENDOSCOPY;  Service: Gastroenterology;  Laterality: N/A;   COLONOSCOPY WITH PROPOFOL N/A 04/14/2022   Procedure: COLONOSCOPY WITH PROPOFOL;  Surgeon: Meridee Score Netty Starring., MD;  Location: [facility] ENDOSCOPY;  Service: Gastroenterology;  Laterality: N/A;   COLONOSCOPY WITH PROPOFOL N/A 10/13/2022   Procedure: COLONOSCOPY WITH PROPOFOL;  Surgeon: Meridee Score Netty Starring., MD;  Location: [facility] ENDOSCOPY;  Service: Gastroenterology;  Laterality: N/A;   CORONARY ANGIOPLASTY WITH STENT PLACEMENT  04/01/2014   "2"   ENDOSCOPIC MUCOSAL RESECTION  09/02/2021   Procedure: ENDOSCOPIC MUCOSAL RESECTION;  Surgeon: Meridee Score Netty Starring., MD;  Location: [facility] ENDOSCOPY;  Service: Gastroenterology;;   ENDOSCOPIC MUCOSAL RESECTION N/A 04/14/2022   Procedure: ENDOSCOPIC MUCOSAL RESECTION;  Surgeon: Lemar Lofty., MD;  Location: [facility] ENDOSCOPY;  Service: Gastroenterology;  Laterality: N/A;   ESOPHAGEAL BRUSHING  04/14/2022   Procedure: ESOPHAGEAL BRUSHING;  Surgeon: Lemar Lofty., MD;  Location: [facility] ENDOSCOPY;  Service: Gastroenterology;;   ESOPHAGEAL DILATION  06/27/2001   ESOPHAGOGASTRODUODENOSCOPY  06/27/2001   ERD, stricture dilated   ESOPHAGOGASTRODUODENOSCOPY (EGD) WITH PROPOFOL N/A 06/05/2021   Procedure: ESOPHAGOGASTRODUODENOSCOPY (EGD) WITH PROPOFOL;  Surgeon: Iva Boop, MD;  Location: [facility] ENDOSCOPY;  Service: Endoscopy;  Laterality: N/A;   ESOPHAGOGASTRODUODENOSCOPY (EGD) WITH PROPOFOL N/A 09/02/2021   Procedure: ESOPHAGOGASTRODUODENOSCOPY (EGD) WITH PROPOFOL;  Surgeon: Meridee Score Netty Starring., MD;  Location: [facility] ENDOSCOPY;  Service: Gastroenterology;  Laterality: N/A;   ESOPHAGOGASTRODUODENOSCOPY (EGD) WITH PROPOFOL N/A 04/14/2022   Procedure: ESOPHAGOGASTRODUODENOSCOPY (EGD) WITH PROPOFOL;  Surgeon: Meridee Score Netty Starring., MD;  Location: [facility] ENDOSCOPY;  Service: Gastroenterology;   Laterality: N/A;   EXCISIONAL HEMORRHOIDECTOMY  ~ 2008   FULL THICKNESS RESECTION (FTRD) N/A 10/13/2022   Procedure: FULL THICKNESS RESECTION (FTRD);  Surgeon: Lemar Lofty., MD;  Location: [facility] ENDOSCOPY;  Service: Gastroenterology;  Laterality: N/A;   HEMOSTASIS CLIP PLACEMENT  09/02/2021   Procedure: HEMOSTASIS CLIP PLACEMENT;  Surgeon: Lemar Lofty., MD;  Location: [facility] ENDOSCOPY;  Service: Gastroenterology;;   HEMOSTASIS CLIP PLACEMENT  10/13/2022   Procedure: HEMOSTASIS CLIP PLACEMENT;  Surgeon: Lemar Lofty., MD;  Location: [facility] ENDOSCOPY;  Service: Gastroenterology;;   HEMOSTASIS CONTROL  04/14/2022   Procedure: HEMOSTASIS CONTROL;  Surgeon: Lemar Lofty., MD;  Location: [facility] ENDOSCOPY;  Service: Gastroenterology;;   HEMOSTASIS CONTROL  10/13/2022   Procedure: HEMOSTASIS CONTROL;  Surgeon: Lemar Lofty., MD;  Location: [facility] ENDOSCOPY;  Service: Gastroenterology;;   HOT HEMOSTASIS N/A 09/02/2021   Procedure: HOT HEMOSTASIS (ARGON PLASMA COAGULATION/BICAP);  Surgeon: Lemar Lofty., MD;  Location: [facility] ENDOSCOPY;  Service: Gastroenterology;  Laterality: N/A;   HOT HEMOSTASIS N/A 04/14/2022   Procedure: HOT HEMOSTASIS (ARGON PLASMA COAGULATION/BICAP);  Surgeon: Lemar Lofty., MD;  Location: [facility] ENDOSCOPY;  Service: Gastroenterology;  Laterality: N/A;  LEFT HEART CATHETERIZATION WITH CORONARY ANGIOGRAM N/A 03/26/2014   Procedure: LEFT HEART CATHETERIZATION WITH CORONARY ANGIOGRAM;  Surgeon: Peter M Swaziland, MD;  Location: [facility] CATH LAB;  Service: Cardiovascular;  Laterality: N/A;   PERCUTANEOUS CORONARY STENT INTERVENTION (PCI-S) N/A 04/01/2014   Procedure: PERCUTANEOUS CORONARY STENT INTERVENTION (PCI-S);  Surgeon: Peter M Swaziland, MD;  Location: [facility] CATH LAB;  Service: Cardiovascular;  Laterality: N/A;   POLYPECTOMY  10/13/2022   Procedure: POLYPECTOMY;  Surgeon: Meridee Score Netty Starring., MD;  Location: [facility] ENDOSCOPY;  Service: Gastroenterology;;    POSTERIOR LAMINECTOMY / DECOMPRESSION LUMBAR SPINE  12/25/2009   L4 , Dr Newell Coral   SCLEROTHERAPY  10/13/2022   Procedure: SCLEROTHERAPY;  Surgeon: Lemar Lofty., MD;  Location: [facility] ENDOSCOPY;  Service: Gastroenterology;;   SHOULDER ARTHROSCOPY WITH ROTATOR CUFF REPAIR AND OPEN BICEPS TENODESIS Left 02/04/2019   Procedure: LEFT SHOULDER ARTHROSCOPY, BICEPS TENODESIS, MINI OPEN ROTATOR CUFF TEAR REPAIR;  Surgeon: Cammy Copa, MD;  Location: [facility];  Service: Orthopedics;  Laterality: Left;   SUBMUCOSAL INJECTION  09/02/2021   Procedure: SUBMUCOSAL INJECTION;  Surgeon: Meridee Score Netty Starring., MD;  Location: [facility] ENDOSCOPY;  Service: Gastroenterology;;   SUBMUCOSAL LIFTING INJECTION  04/14/2022   Procedure: SUBMUCOSAL LIFTING INJECTION;  Surgeon: Lemar Lofty., MD;  Location: [facility] ENDOSCOPY;  Service: Gastroenterology;;   TOTAL KNEE ARTHROPLASTY Right 05/11/2021   Procedure: TOTAL KNEE ARTHROPLASTY;  Surgeon: Durene Romans, MD;  Location: [facility] ORS;  Service: Orthopedics;  Laterality: Right;    Current Medications: Current Meds  Medication Sig   Accu-Chek Softclix Lancets lancets 1 each by Other route 3 (three) times daily.   Blood Glucose Monitoring Suppl (ACCU-CHEK AVIVA PLUS) w/Device KIT Check glucose TID or as needed E11.9   cetirizine (ZYRTEC) 10 MG tablet Take 10 mg by mouth daily as needed for allergies.   Coenzyme Q10 (COQ10) 100 MG CAPS Take 200 mg by mouth daily.   famotidine-calcium carbonate-magnesium hydroxide (PEPCID COMPLETE) 10-800-165 MG chewable tablet Chew 1 tablet by mouth daily as needed (acid reflux).   glucose blood test strip Use as instructed   hydrochlorothiazide (HYDRODIURIL) 25 MG tablet Take 1 tablet (25 mg total) by mouth daily.   metFORMIN (GLUCOPHAGE-XR) 500 MG 24 hr tablet TAKE 2 TABLETS DAILY AFTER SUPPER   Multiple Vitamins-Minerals (MULTIVITAMIN WITH MINERALS) tablet Take 1 tablet by mouth daily.   nitroGLYCERIN  (NITROSTAT) 0.4 MG SL tablet Place 1 tablet (0.4 mg total) under the tongue every 5 (five) minutes as needed for chest pain.   pantoprazole (PROTONIX) 40 MG tablet TAKE 1 TABLET TWICE DAILY  BEFORE MEALS (Patient taking differently: Take 40 mg by mouth daily.)   Propylene Glycol, PF, (SYSTANE COMPLETE PF) 0.6 % SOLN Place 1 drop into both eyes daily as needed (dry eyes).   sildenafil (REVATIO) 20 MG tablet Take 1-5 tablets by mouth daily as needed.   sodium chloride (OCEAN) 0.65 % SOLN nasal spray Place 1 spray into both nostrils as needed for congestion.   Undecylenic Ac-Zn Undecylenate (FUNGI-NAIL TOE & FOOT EX) Apply 1 application  topically daily as needed (fungus).   vitamin B-12 (CYANOCOBALAMIN) 1000 MCG tablet Take 1,000 mcg by mouth daily.   [DISCONTINUED] atorvastatin (LIPITOR) 80 MG tablet TAKE 1 TABLET DAILY AT     6:00PM   [DISCONTINUED] carvedilol (COREG) 3.125 MG tablet TAKE 1 TABLET TWICE DAILY  WITH MEALS   [DISCONTINUED] clopidogrel (PLAVIX) 75 MG tablet Take 1 tablet (75 mg total) by mouth daily.   [DISCONTINUED] irbesartan (AVAPRO) 300  MG tablet TAKE 1 TABLET DAILY   [DISCONTINUED] spironolactone (ALDACTONE) 25 MG tablet TAKE 1 TABLET DAILY     Allergies:   Aspirin, Oxycodone, Contrast media [iodinated contrast media], and Norvasc [amlodipine besylate]   Social History   Socioeconomic History   Marital status: Single    Spouse name: Not on file   Number of children: 0   Years of education: 16   Highest education level: Not on file  Occupational History   Occupation: Retired   Tobacco Use   Smoking status: Never   Smokeless tobacco: Never  Vaping Use   Vaping Use: Never used  Substance and Sexual Activity   Alcohol use: Yes    Alcohol/week: 2.0 standard drinks of alcohol    Types: 2 Cans of beer per week    Comment: social   Drug use: No   Sexual activity: Not Currently  Other Topics Concern   Not on file  Social History Narrative   Fun: Water quality scientist.     Social Determinants of Health   Financial Resource Strain: Low Risk  (03/24/2022)   Overall Financial Resource Strain (CARDIA)    Difficulty of Paying Living Expenses: Not hard at all  Food Insecurity: No Food Insecurity (03/24/2022)   Hunger Vital Sign    Worried About Running Out of Food in the Last Year: Never true    Ran Out of Food in the Last Year: Never true  Transportation Needs: No Transportation Needs (03/24/2022)   PRAPARE - Administrator, Civil Service (Medical): No    Lack of Transportation (Non-Medical): No  Physical Activity: Sufficiently Active (03/24/2022)   Exercise Vital Sign    Days of Exercise per Week: 3 days    Minutes of Exercise per Session: 60 min  Stress: No Stress Concern Present (03/24/2022)   [employer] of Occupational Health - Occupational Stress Questionnaire    Feeling of Stress : Not at all  Social Connections: Socially Isolated (03/24/2022)   Social Connection and Isolation Panel [NHANES]    Frequency of Communication with Friends and Family: More than three times a week    Frequency of Social Gatherings with Friends and Family: More than three times a week    Attends Religious Services: Never    Database administrator or Organizations: No    Attends Engineer, structural: Never    Marital Status: Never married     Family History: The patient's family history includes Breast cancer in his maternal aunt; Diabetes in his brother, father, and paternal grandmother; Heart attack in his brother; Heart attack (age of onset: 75) in his maternal grandfather; Heart attack (age of onset: 66) in his mother; Heart disease in his sister; Heart disease (age of onset: 81) in his father; Prostate cancer in his father; Stroke (age of onset: 30) in his mother; Stroke (age of onset: 22) in his paternal grandfather. There is no history of Stomach cancer, Rectal cancer, Esophageal cancer, Colon cancer, Inflammatory bowel disease, Liver disease, or  Pancreatic cancer.  ROS:   Please see the history of present illness.     All other systems reviewed and are negative.  EKGs/Labs/Other Studies Reviewed:    The following studies were reviewed today:  Myoview 09/23/2020 The left ventricular ejection fraction is mildly decreased (45-54%). Nuclear stress EF: 49%. Mid anterior wall hypokinesis. There was no ST segment deviation noted during stress. Defect 1: There is a medium defect of severe severity present in the mid anterior  location. Findings consistent with prior myocardial infarction. This is an intermediate risk study. Old infarct pattern noted in the mid inferior wall distribution. Prior LAD stent placed. No ischemia identified.   EKG:  EKG is ordered today.  The ekg ordered today demonstrates sinus bradycardia, heart rate 51 bpm.  Recent Labs: 03/15/2022: TSH 1.04 09/14/2022: ALT 17; BUN 19; Creatinine, Ser 1.00; Hemoglobin 14.5; Platelets 238.0; Potassium 3.9; Sodium 138  Recent Lipid Panel    Component Value Date/Time   CHOL 110 09/02/2020 1010   CHOL 132 08/18/2016 0814   TRIG 100.0 09/02/2020 1010   TRIG 82 04/11/2006 0804   HDL 35.80 (L) 09/02/2020 1010   HDL 41 08/18/2016 0814   CHOLHDL 3 09/02/2020 1010   VLDL 20.0 09/02/2020 1010   LDLCALC 54 09/02/2020 1010   LDLCALC 68 03/05/2020 1009     Risk Assessment/Calculations:           Physical Exam:    VS:  BP (!) 140/68   Pulse (!) 51   Ht 5' 8.5" (1.74 m)   Wt 184 lb 6.4 oz (83.6 kg)   SpO2 97%   BMI 27.63 kg/m     HYPERTENSION CONTROL Vitals:   11/23/22 0834 11/23/22 0906  BP: (!) 152/78 (!) 140/68    The patient's blood pressure is elevated above target today.  In order to address the patient's elevated BP: A current anti-hypertensive medication was adjusted today.      Wt Readings from Last 3 Encounters:  11/23/22 184 lb 6.4 oz (83.6 kg)  10/13/22 177 lb (80.3 kg)  09/14/22 188 lb 9.6 oz (85.5 kg)     GEN:  Well nourished, well  developed in no acute distress HEENT: Normal NECK: No JVD; No carotid bruits LYMPHATICS: No lymphadenopathy CARDIAC: RRR, no murmurs, rubs, gallops RESPIRATORY:  Clear to auscultation without rales, wheezing or rhonchi  ABDOMEN: Soft, non-tender, non-distended MUSCULOSKELETAL:  No edema; No deformity  SKIN: Warm and dry NEUROLOGIC:  Alert and oriented x 3 PSYCHIATRIC:  Normal affect   ASSESSMENT:    1. Pre-op examination   2. Coronary artery disease of native artery of native heart with stable angina pectoris (HCC)   3. Essential hypertension   4. Hyperlipidemia LDL goal <70   5. Controlled type 2 diabetes mellitus without complication, without long-term current use of insulin (HCC)    PLAN:    In order of problems listed above:  Preop clearance: Upcoming robotic right colectomy surgery by Dr. Karie Soda.  Patient has no problem accomplish more than 4 METS of activity.  His RCRI perioperative risk is class II, 0.9% risk of major cardiac event.  He may hold Plavix for 5 to 7 days prior to the surgery and restart as soon as possible afterward at the surgeon's discretion.  CAD: Denies any recent chest pain.  Last Myoview was in 2022.  Hypertension: Blood pressure elevated today even on manual repeat.  He apparently dropped his bottle of hydrochlorothiazide in water, therefore has not taken any hydrochlorothiazide in the past 10 days.  He plans to get a refill.  Hyperlipidemia: On Lipitor  DM2: Managed by primary care provider.           Medication Adjustments/Labs and Tests Ordered: Current medicines are reviewed at length with the patient today.  Concerns regarding medicines are outlined above.  Orders Placed This Encounter  Procedures   EKG 12-Lead   No orders of the defined types were placed in this encounter.   Patient  Instructions  Medication Instructions:   No changes  *If you need a refill on your cardiac medications before your next appointment, please call  your pharmacy*   Lab Work: Not needed    Testing/Procedures: Not needed   Follow-Up: At [facility], you and your health needs are our priority.  As part of our continuing mission to provide you with exceptional heart care, we have created designated Provider Care Teams.  These Care Teams include your primary Cardiologist (physician) and Advanced Practice Providers (APPs -  Physician Assistants and Nurse Practitioners) who all work together to provide you with the care you need, when you need it.     Your next appointment:   6 month(s)  The format for your next appointment:   In Person  Provider:   Peter Swaziland, MD    Other Instructions   You are cleared for  surgery by Dr Gordy Savers. You can hold Clopidogrel 5 to 7 days  prior to surgery  and restart at the discretion of the surgeon    Signed, Azalee Course, PA  11/23/2022 10:13 AM    [facility]

## 2022-11-29 ENCOUNTER — Telehealth: Payer: Self-pay | Admitting: Cardiology

## 2022-11-29 ENCOUNTER — Encounter (HOSPITAL_COMMUNITY): Payer: Self-pay

## 2022-11-29 NOTE — Telephone Encounter (Signed)
Patient stated he needed refill on hydrochlorothiazide because he dropped his pill bottle in the sink.   Spoke with pharmacy and he has not picked up a refill in a while and they were going to fill for him today. Patient is aware

## 2022-11-29 NOTE — Telephone Encounter (Signed)
Pt c/o medication issue:  1. Name of Medication: hydrochlorothiazide (HYDRODIURIL) 25 MG tablet   2. How are you currently taking this medication (dosage and times per day)? As prescribed   3. Are you having a reaction (difficulty breathing--STAT)?   4. What is your medication issue? Patient states that he spilled his medication down the sink and would need another prescription called in for him. Requesting medication be called into:  [facility] [phone] - [place], [place] - [address]

## 2022-12-15 ENCOUNTER — Encounter: Payer: Self-pay | Admitting: Family Medicine

## 2022-12-15 ENCOUNTER — Ambulatory Visit (INDEPENDENT_AMBULATORY_CARE_PROVIDER_SITE_OTHER): Payer: Medicare Other | Admitting: Family Medicine

## 2022-12-15 VITALS — BP 125/82 | HR 61 | Temp 97.5°F | Ht 68.5 in | Wt 176.8 lb

## 2022-12-15 DIAGNOSIS — I152 Hypertension secondary to endocrine disorders: Secondary | ICD-10-CM | POA: Diagnosis not present

## 2022-12-15 DIAGNOSIS — D489 Neoplasm of uncertain behavior, unspecified: Secondary | ICD-10-CM | POA: Diagnosis not present

## 2022-12-15 DIAGNOSIS — Z7984 Long term (current) use of oral hypoglycemic drugs: Secondary | ICD-10-CM | POA: Diagnosis not present

## 2022-12-15 DIAGNOSIS — E1165 Type 2 diabetes mellitus with hyperglycemia: Secondary | ICD-10-CM | POA: Diagnosis not present

## 2022-12-15 DIAGNOSIS — E1159 Type 2 diabetes mellitus with other circulatory complications: Secondary | ICD-10-CM | POA: Diagnosis not present

## 2022-12-15 LAB — POCT GLYCOSYLATED HEMOGLOBIN (HGB A1C): Hemoglobin A1C: 7.2 % — AB (ref 4.0–5.6)

## 2022-12-15 MED ORDER — GLUCOSE BLOOD VI STRP: ORAL_STRIP | 12 refills | Status: DC

## 2022-12-15 MED ORDER — SILDENAFIL CITRATE 20 MG PO TABS: ORAL_TABLET | ORAL | 0 refills | Status: AC

## 2022-12-15 MED ORDER — ACCU-CHEK SOFTCLIX LANCETS MISC: 1.0000 | Freq: Three times a day (TID) | 11 refills | Status: DC

## 2022-12-15 NOTE — Patient Instructions (Addendum)
It was very nice to see you today!  Your A1c is much better today.  We may consider synjardy in the future if your sugar goes back up.   Return in about 3 months (around 03/17/2023) for Follow Up.   Take care, Dr Jimmey Ralph  PLEASE NOTE:  If you had any lab tests, please let us know if you have not heard back within a few days. You may see your results on mychart before we have a chance to review them but we will give you a call once they are reviewed by [place].   If we ordered any referrals today, please let us know if you have not heard from their office within the next week.   If you had any urgent prescriptions sent in today, please check with the pharmacy within an hour of our visit to make sure the prescription was transmitted appropriately.   Please try these tips to maintain a healthy lifestyle:  Eat at least 3 REAL meals and 1-2 snacks per day.  Aim for no more than 5 hours between eating.  If you eat breakfast, please do so within one hour of getting up.   Each meal should contain half fruits/vegetables, one quarter protein, and one quarter carbs (no bigger than a computer mouse)  Cut down on sweet beverages. This includes juice, soda, and sweet tea.   Drink at least 1 glass of water with each meal and aim for at least 8 glasses per day  Exercise at least 150 minutes every week.

## 2022-12-15 NOTE — Assessment & Plan Note (Signed)
A1c stable at 7.2. He is doing a great job with diet and exercise. We will recheck in 3 months. Continue metformin 1000mg  daily. May consider synjardy in the future if A1c is not at goal.

## 2022-12-15 NOTE — Progress Notes (Signed)
   [name] [name] is a 72 y.o. male who presents today for an office visit.  Assessment/Plan:  Chronic Problems Addressed Today: T2DM (type 2 diabetes mellitus) (HCC) A1c stable at 7.2. He is doing a great job with diet and exercise. We will recheck in 3 months. Continue metformin 1000mg  daily. May consider synjardy in the future if A1c is not at goal.   Villous adenoma Continue management per gastroenterology and general surgery.   Hypertension associated with diabetes (HCC) Blood pressure at goal today on current regimen Coreg 3.125 mg twice daily, HCTZ 25 mg daily, irbesartan 300 mg daily, and spironolactone 25 mg daily.     Subjective:  HPI:  See Assessment / plan for status of chronic conditions.  Patient here today for 40-month follow-up.  3 months ago A1c was 8.7.  We had discussed adding on medications at that time however he declined.  He has been working on lifestyle modifications.  Cutting down on sugar intake.  Since our last visit he did have colonoscopy which was significant for villous adenoma. He was not able to have this completely removed during his colonoscopy.  He is planning on robotic partial colectomy with general surgery.       Objective:  Physical Exam: BP 125/82   Pulse 61   Temp (!) 97.5 F (36.4 C) (Temporal)   Ht 5' 8.5" (1.74 m)   Wt 176 lb 12.8 oz (80.2 kg)   SpO2 99%   BMI 26.49 kg/m   Gen: No acute distress, resting comfortably CV: Regular rate and rhythm with no murmurs appreciated Pulm: Normal work of breathing, clear to auscultation bilaterally with no crackles, wheezes, or rhonchi Neuro: Grossly normal, moves all extremities Psych: Normal affect and thought content      [name] Cervone M. Jimmey Ralph, MD 12/15/2022 9:59 AM

## 2022-12-15 NOTE — Assessment & Plan Note (Addendum)
Continue management per gastroenterology and general surgery.

## 2022-12-15 NOTE — Assessment & Plan Note (Signed)
Blood pressure at goal today on current regimen Coreg 3.125 mg twice daily, HCTZ 25 mg daily, irbesartan 300 mg daily, and spironolactone 25 mg daily.

## 2022-12-19 ENCOUNTER — Telehealth: Payer: Self-pay | Admitting: Family Medicine

## 2022-12-19 NOTE — Telephone Encounter (Signed)
[name] [name] needs clarification on an RX sent to them. Please call them back at [phone]. Please advise.

## 2022-12-20 NOTE — Telephone Encounter (Signed)
Called pharmacy, Dx code given E11.9

## 2022-12-22 NOTE — Patient Instructions (Addendum)
SURGICAL WAITING ROOM VISITATION Patients having surgery or a procedure may have no more than 2 support people in the waiting area - these visitors may rotate.    Children under the age of 98 must have an adult with them who is not the patient.  If the patient needs to stay at the hospital during part of their recovery, the visitor guidelines for inpatient rooms apply. Pre-op nurse will coordinate an appropriate time for 1 support person to accompany patient in pre-op.  This support person may not rotate.    Please refer to the [facility] website for the visitor guidelines for Inpatients (after your surgery is over and you are in a regular room).       Your procedure is scheduled on: 01-05-23   Report to [facility] Main Entrance    Report to admitting at 8:30 AM   Call this number if you have problems the morning of surgery [phone]   Follow a clear liquid diet the day before to prevent dehydration   After Midnight you may have the following liquids until 7:45 AM DAY OF SURGERY  Water Non-Citrus Juices (without pulp, NO RED-Apple, White grape, White cranberry) Black Coffee (NO MILK/CREAM OR CREAMERS, sugar ok)  Clear Tea (NO MILK/CREAM OR CREAMERS, sugar ok) regular and decaf                             Plain Jell-O (NO RED)                                           Fruit ices (not with fruit pulp, NO RED)                                     Popsicles (NO RED)                                                               Sports drinks like Gatorade (NO RED)  Drink 2 Pre-surgery G2 the evening before surgery (have completed by 10 PM)                   The day of surgery:  Drink ONE (1) Pre-Surgery G2 at 7:45 AM the morning of surgery. Drink in one sitting. Do not sip.  This drink was given to you during your hospital  pre-op appointment visit. Nothing else to drink after completing the Pre-Surgery G2.          If you have questions, please contact your  surgeon's office.   FOLLOW BOWEL PREP AND ANY ADDITIONAL PRE OP INSTRUCTIONS YOU RECEIVED FROM YOUR SURGEON'S OFFICE!!!  -Clear liquids day of prep to prevent dehydration  - Bisacodyl (Dulcolax) 20 mg - Give with water the day prior to surgery.   - Miralax 255g - Mix with 64 oz Gatorade/Powerade.  Drink gradually over the next few hours (8 oz glass every 15-30 minutes) until gone the day prior to surgery.   -Metronidazole (Flagyl) 1000 mg - At 2 pm, 3 pm and 10 pm after  Miralax bowel prep the day prior to surgery.     -Neomycin 1000 mg - At 2 pm, 3 pm and 10 pm after Miralax  bowel prep the day      prior to surgery.      Oral Hygiene is also important to reduce your risk of infection.                                    Remember - BRUSH YOUR TEETH THE MORNING OF SURGERY WITH YOUR REGULAR TOOTHPASTE   Do NOT smoke after Midnight   Take these medicines the morning of surgery with A SIP OF WATER:   Carvedilol  Pantoprazole   Hold Plavix 5 days before surgery (do not take after 12-30-22)  How to Manage Your Diabetes Before and After Surgery  Why is it important to control my blood sugar before and after surgery? Improving blood sugar levels before and after surgery helps healing and can limit problems. A way of improving blood sugar control is eating a healthy diet by:  Eating less sugar and carbohydrates  Increasing activity/exercise  Talking with your doctor about reaching your blood sugar goals High blood sugars (greater than 180 mg/dL) can raise your risk of infections and slow your recovery, so you will need to focus on controlling your diabetes during the weeks before surgery. Make sure that the doctor who takes care of your diabetes knows about your planned surgery including the date and location.  How do I manage my blood sugar before surgery? Check your blood sugar at least 4 times a day, starting 2 days before surgery, to make sure that the level is not too high or  low. Check your blood sugar the morning of your surgery when you wake up and every 2 hours until you get to the Short Stay unit. If your blood sugar is less than 70 mg/dL, you will need to treat for low blood sugar: Do not take insulin. Treat a low blood sugar (less than 70 mg/dL) with  cup of clear juice (cranberry or apple), 4 glucose tablets, OR glucose gel. Recheck blood sugar in 15 minutes after treatment (to make sure it is greater than 70 mg/dL). If your blood sugar is not greater than 70 mg/dL on recheck, call [phone] for further instructions. Report your blood sugar to the short stay nurse when you get to Short Stay.  If you are admitted to the hospital after surgery: Your blood sugar will be checked by the staff and you will probably be given insulin after surgery (instead of oral diabetes medicines) to make sure you have good blood sugar levels. The goal for blood sugar control after surgery is 80-180 mg/dL.   WHAT DO I DO ABOUT MY DIABETES MEDICATION?  Do not take oral diabetes medicines (pills) the morning of surgery.(Do not take Metformin the morning of surgery)  DO NOT TAKE THE FOLLOWING 7 DAYS PRIOR TO SURGERY: Ozempic, Wegovy, Rybelsus (Semaglutide), Byetta (exenatide), Bydureon (exenatide ER), Victoza, Saxenda (liraglutide), or Trulicity (dulaglutide) Mounjaro (Tirzepatide) Adlyxin (Lixisenatide), Polyethylene Glycol Loxenatide.   Reviewed and Endorsed by [facility] Patient Education Committee, August 2015                              You may not have any metal on your body including  jewelry, and body piercing  Do not wear make-up, lotions, powders, cologne, or deodorant              Men may shave face and neck.   Do not bring valuables to the hospital. [facility] IS NOT RESPONSIBLE   FOR VALUABLES.   Contacts, dentures or bridgework may not be worn into surgery.   Bring small overnight bag day of surgery.   DO NOT BRING YOUR HOME MEDICATIONS TO  THE HOSPITAL. PHARMACY WILL DISPENSE MEDICATIONS LISTED ON YOUR MEDICATION LIST TO YOU DURING YOUR ADMISSION IN THE HOSPITAL!    Special Instructions: Bring a copy of your healthcare power of attorney and living will documents the day of surgery if you haven't scanned them before.              Please read over the following fact sheets you were given: IF YOU HAVE QUESTIONS ABOUT YOUR PRE-OP INSTRUCTIONS PLEASE CALL [phone] [facility]  If you received a COVID test during your pre-op visit  it is requested that you wear a mask when out in public, stay away from anyone that may not be feeling well and notify your surgeon if you develop symptoms. If you test positive for Covid or have been in contact with anyone that has tested positive in the last 10 days please notify you surgeon.  [place] - Preparing for Surgery Before surgery, you can play an important role.  Because skin is not sterile, your skin needs to be as free of germs as possible.  You can reduce the number of germs on your skin by washing with CHG (chlorahexidine gluconate) soap before surgery.  CHG is an antiseptic cleaner which kills germs and bonds with the skin to continue killing germs even after washing. Please DO NOT use if you have an allergy to CHG or antibacterial soaps.  If your skin becomes reddened/irritated stop using the CHG and inform your nurse when you arrive at Short Stay. Do not shave (including legs and underarms) for at least 48 hours prior to the first CHG shower.  You may shave your face/neck.  Please follow these instructions carefully:  1.  Shower with CHG Soap the night before surgery and the  morning of surgery.  2.  If you choose to wash your hair, wash your hair first as usual with your normal  shampoo.  3.  After you shampoo, rinse your hair and body thoroughly to remove the shampoo.                             4.  Use CHG as you would any other liquid soap.  You can apply chg directly to the skin and  wash.  Gently with a scrungie or clean washcloth.  5.  Apply the CHG Soap to your body ONLY FROM THE NECK DOWN.   Do   not use on face/ open                           Wound or open sores. Avoid contact with eyes, ears mouth and   genitals (private parts).                       Wash face,  Genitals (private parts) with your normal soap.             6.  Wash thoroughly, paying special attention to the area where your  surgery  will be performed.  7.  Thoroughly rinse your body with warm water from the neck down.  8.  DO NOT shower/wash with your normal soap after using and rinsing off the CHG Soap.                9.  Pat yourself dry with a clean towel.            10.  Wear clean pajamas.            11.  Place clean sheets on your bed the night of your first shower and do not  sleep with pets. Day of Surgery : Do not apply any lotions/deodorants the morning of surgery.  Please wear clean clothes to the hospital/surgery center.  FAILURE TO FOLLOW THESE INSTRUCTIONS MAY RESULT IN THE CANCELLATION OF YOUR SURGERY  PATIENT SIGNATURE_________________________________  NURSE SIGNATURE__________________________________  ________________________________________________________________________     Rogelia Mire  An incentive spirometer is a tool that can help keep your lungs clear and active. This tool measures how well you are filling your lungs with each breath. Taking long deep breaths may help reverse or decrease the chance of developing breathing (pulmonary) problems (especially infection) following: A long period of time when you are unable to move or be active. BEFORE THE PROCEDURE  If the spirometer includes an indicator to show your best effort, your nurse or respiratory therapist will set it to a desired goal. If possible, sit up straight or lean slightly forward. Try not to slouch. Hold the incentive spirometer in an upright position. INSTRUCTIONS FOR USE  Sit on the edge of  your bed if possible, or sit up as far as you can in bed or on a chair. Hold the incentive spirometer in an upright position. Breathe out normally. Place the mouthpiece in your mouth and seal your lips tightly around it. Breathe in slowly and as deeply as possible, raising the piston or the ball toward the top of the column. Hold your breath for 3-5 seconds or for as long as possible. Allow the piston or ball to fall to the bottom of the column. Remove the mouthpiece from your mouth and breathe out normally. Rest for a few seconds and repeat Steps 1 through 7 at least 10 times every 1-2 hours when you are awake. Take your time and take a few normal breaths between deep breaths. The spirometer may include an indicator to show your best effort. Use the indicator as a goal to work toward during each repetition. After each set of 10 deep breaths, practice coughing to be sure your lungs are clear. If you have an incision (the cut made at the time of surgery), support your incision when coughing by placing a pillow or rolled up towels firmly against it. Once you are able to get out of bed, walk around indoors and cough well. You may stop using the incentive spirometer when instructed by your caregiver.  RISKS AND COMPLICATIONS Take your time so you do not get dizzy or light-headed. If you are in pain, you may need to take or ask for pain medication before doing incentive spirometry. It is harder to take a deep breath if you are having pain. AFTER USE Rest and breathe slowly and easily. It can be helpful to keep track of a log of your progress. Your caregiver can provide you with a simple table to help with this. If you are using the spirometer at home, follow these instructions: [facility] MEDICAL CARE  IF:  You are having difficultly using the spirometer. You have trouble using the spirometer as often as instructed. Your pain medication is not giving enough relief while using the spirometer. You develop  fever of 100.5 F (38.1 C) or higher. SEEK IMMEDIATE MEDICAL CARE IF:  You cough up bloody sputum that had not been present before. You develop fever of 102 F (38.9 C) or greater. You develop worsening pain at or near the incision site. MAKE SURE YOU:  Understand these instructions. Will watch your condition. Will get help right away if you are not doing well or get worse. Document Released: 10/24/2006 Document Revised: 09/05/2011 Document Reviewed: 12/25/2006 [employer] Patient Information 2014 [employer], [place].   ________________________________________________________________________ WHAT IS A BLOOD TRANSFUSION? Blood Transfusion Information  A transfusion is the replacement of blood or some of its parts. Blood is made up of multiple cells which provide different functions. Red blood cells carry oxygen and are used for blood loss replacement. White blood cells fight against infection. Platelets control bleeding. Plasma helps clot blood. Other blood products are available for specialized needs, such as hemophilia or other clotting disorders. BEFORE THE TRANSFUSION  Who gives blood for transfusions?  Healthy volunteers who are fully evaluated to make sure their blood is safe. This is blood bank blood. Transfusion therapy is the safest it has ever been in the practice of medicine. Before blood is taken from a donor, a complete history is taken to make sure that person has no history of diseases nor engages in risky social behavior (examples are intravenous drug use or sexual activity with multiple partners). The donor's travel history is screened to minimize risk of transmitting infections, such as malaria. The donated blood is tested for signs of infectious diseases, such as HIV and hepatitis. The blood is then tested to be sure it is compatible with you in order to minimize the chance of a transfusion reaction. If you or a relative donates blood, this is often done in anticipation of  surgery and is not appropriate for emergency situations. It takes many days to process the donated blood. RISKS AND COMPLICATIONS Although transfusion therapy is very safe and saves many lives, the main dangers of transfusion include:  Getting an infectious disease. Developing a transfusion reaction. This is an allergic reaction to something in the blood you were given. Every precaution is taken to prevent this. The decision to have a blood transfusion has been considered carefully by your caregiver before blood is given. Blood is not given unless the benefits outweigh the risks. AFTER THE TRANSFUSION Right after receiving a blood transfusion, you will usually feel much better and more energetic. This is especially true if your red blood cells have gotten low (anemic). The transfusion raises the level of the red blood cells which carry oxygen, and this usually causes an energy increase. The nurse administering the transfusion will monitor you carefully for complications. HOME CARE INSTRUCTIONS  No special instructions are needed after a transfusion. You may find your energy is better. Speak with your caregiver about any limitations on activity for underlying diseases you may have. SEEK MEDICAL CARE IF:  Your condition is not improving after your transfusion. You develop redness or irritation at the intravenous (IV) site. SEEK IMMEDIATE MEDICAL CARE IF:  Any of the following symptoms occur over the next 12 hours: Shaking chills. You have a temperature by mouth above 102 F (38.9 C), not controlled by medicine. Chest, back, or muscle pain. People around you feel you are  not acting correctly or are confused. Shortness of breath or difficulty breathing. Dizziness and fainting. You get a rash or develop hives. You have a decrease in urine output. Your urine turns a dark color or changes to pink, red, or brown. Any of the following symptoms occur over the next 10 days: You have a temperature by  mouth above 102 F (38.9 C), not controlled by medicine. Shortness of breath. Weakness after normal activity. The white part of the eye turns yellow (jaundice). You have a decrease in the amount of urine or are urinating less often. Your urine turns a dark color or changes to pink, red, or brown. Document Released: 06/10/2000 Document Revised: 09/05/2011 Document Reviewed: 01/28/2008 [facility] Patient Information 2014 [place], [place].  _______________________________________________________________________

## 2022-12-22 NOTE — Progress Notes (Addendum)
COVID Vaccine Completed:  Yes  Date of COVID positive in last 90 days:  No  PCP - Jacquiline Doe, MD Cardiologist - Peter [place], MD  Cardiac clearance in Epic dated 11-23-22 by Azalee Course, PA  Chest x-ray - N/A EKG - 11-23-22 Epic Stress Test -09-23-20 Epic  ECHO - Unsure Cardiac Cath - 2015 Pacemaker/ICD device last checked: Spinal Cord Stimulator:  N/A  Bowel Prep - Yes  Sleep Study - N/A CPAP -   CBG 247 at PAT, pt states that he ate fruit before bed and had cereal this morning Fasting Blood Sugar - 150 to 165 Checks Blood Sugar - 1 time a day  Last dose of GLP1 agonist-  N/A GLP1 instructions:  N/A   Last dose of SGLT-2 inhibitors-  N/A SGLT-2 instructions: N/A   Blood Thinner Instructions:  Plavix .  Pt states that he is to hold 5 days before surgery  Aspirin Instructions: Last Dose:  Activity level:  Can go up a flight of stairs and perform activities of daily living without stopping and without symptoms of chest pain or shortness of breath.  Able to exercise without symptoms  Anesthesia review:  CAD with history of angioplasty, HTN, DM  Patient denies shortness of breath, fever, cough and chest pain at PAT appointment  Patient verbalized understanding of instructions that were given to them at the PAT appointment. Patient was also instructed that they will need to review over the PAT instructions again at home before surgery.

## 2022-12-26 ENCOUNTER — Other Ambulatory Visit: Payer: Self-pay

## 2022-12-26 ENCOUNTER — Encounter (HOSPITAL_COMMUNITY)
Admission: RE | Admit: 2022-12-26 | Discharge: 2022-12-26 | Disposition: A | Discharge status: Home / Self Care | Payer: Medicare Other | Source: Ambulatory Visit | Attending: Surgery | Admitting: Surgery

## 2022-12-26 ENCOUNTER — Encounter (HOSPITAL_COMMUNITY): Payer: Self-pay

## 2022-12-26 VITALS — BP 122/71 | HR 57 | Temp 98.0°F | Resp 16 | Ht 68.5 in | Wt 176.4 lb

## 2022-12-26 DIAGNOSIS — Z01812 Encounter for preprocedural laboratory examination: Secondary | ICD-10-CM | POA: Diagnosis not present

## 2022-12-26 DIAGNOSIS — K219 Gastro-esophageal reflux disease without esophagitis: Secondary | ICD-10-CM | POA: Insufficient documentation

## 2022-12-26 DIAGNOSIS — K635 Polyp of colon: Secondary | ICD-10-CM | POA: Diagnosis not present

## 2022-12-26 DIAGNOSIS — E119 Type 2 diabetes mellitus without complications: Secondary | ICD-10-CM | POA: Insufficient documentation

## 2022-12-26 DIAGNOSIS — Z955 Presence of coronary angioplasty implant and graft: Secondary | ICD-10-CM | POA: Insufficient documentation

## 2022-12-26 DIAGNOSIS — I10 Essential (primary) hypertension: Secondary | ICD-10-CM | POA: Diagnosis not present

## 2022-12-26 DIAGNOSIS — I2510 Atherosclerotic heart disease of native coronary artery without angina pectoris: Secondary | ICD-10-CM | POA: Insufficient documentation

## 2022-12-26 DIAGNOSIS — Z01818 Encounter for other preprocedural examination: Secondary | ICD-10-CM

## 2022-12-26 HISTORY — DX: Anemia, unspecified: D64.9

## 2022-12-26 HISTORY — DX: Unspecified osteoarthritis, unspecified site: M19.90

## 2022-12-26 LAB — CBC
HCT: 43 % (ref 39.0–52.0)
Hemoglobin: 14.6 g/dL (ref 13.0–17.0)
MCH: 33.4 pg (ref 26.0–34.0)
MCHC: 34 g/dL (ref 30.0–36.0)
MCV: 98.4 fL (ref 80.0–100.0)
Platelets: 212 10*3/uL (ref 150–400)
RBC: 4.37 MIL/uL (ref 4.22–5.81)
RDW: 12.8 % (ref 11.5–15.5)
WBC: 10.1 10*3/uL (ref 4.0–10.5)
nRBC: 0 % (ref 0.0–0.2)

## 2022-12-26 LAB — BASIC METABOLIC PANEL
Anion gap: 10 (ref 5–15)
BUN: 22 mg/dL (ref 8–23)
CO2: 24 mmol/L (ref 22–32)
Calcium: 9.6 mg/dL (ref 8.9–10.3)
Chloride: 102 mmol/L (ref 98–111)
Creatinine, Ser: 1.07 mg/dL (ref 0.61–1.24)
GFR, Estimated: >60 mL/min (ref >60)
Glucose, Bld: 221 mg/dL — ABNORMAL HIGH (ref 70–99)
Potassium: 4.1 mmol/L (ref 3.5–5.1)
Sodium: 136 mmol/L (ref 135–145)

## 2022-12-26 LAB — GLUCOSE, CAPILLARY: Glucose-Capillary: 247 mg/dL — ABNORMAL HIGH (ref 70–99)

## 2022-12-26 LAB — TYPE AND SCREEN: ABO/RH(D): O POS

## 2022-12-27 NOTE — Progress Notes (Signed)
Anesthesia Chart Review   Case: [id number] Date/Time: 01/05/23 1030   Procedure: ROBOTIC COLECTOMY PROXIMAL   Anesthesia type: General   Pre-op diagnosis: COLON POLYP UNRESECTABLE BY COLONSCOPY   Location: [facility]OM 02 / [facility]   Surgeons: Karie Soda, MD       DISCUSSION:72 y.o. never smoker with h/o GERD, HTN, DM II (A1C 7.2), CAD s/p stent, colon polyp unresectable by colonoscopy scheduled for above procedure 01/05/23 with Dr. Karie Soda.   Pt seen by cardiology 11/23/2022. Per OV note, "Preop clearance: Upcoming robotic right colectomy surgery by Dr. Karie Soda.  Patient has no problem accomplish more than 4 METS of activity.  His RCRI perioperative risk is class II, 0.9% risk of major cardiac event.  He may hold Plavix for 5 to 7 days prior to the surgery and restart as soon as possible afterward at the surgeon's discretion."  Anticipate pt can proceed with planned procedure barring acute status change.   VS: BP 122/71   Pulse (!) 57   Temp 36.7 C (Oral)   Resp 16   Ht 5' 8.5" (1.74 m)   Wt 80 kg   SpO2 100%   BMI 26.43 kg/m   PROVIDERS: Ardith Dark, MD is PCP   Cardiologist - Peter Swaziland, MD  LABS:  blood glucose non-fasting, evaluate CBG DOS (all labs ordered are listed, but only abnormal results are displayed)  Labs Reviewed  BASIC METABOLIC PANEL - Abnormal; Notable for the following components:      Result Value   Glucose, Bld 221 (*)    All other components within normal limits  GLUCOSE, CAPILLARY - Abnormal; Notable for the following components:   Glucose-Capillary 247 (*)    All other components within normal limits  CBC  TYPE AND SCREEN     IMAGES:   EKG:   CV: Myocardial Perfusion 09/23/2020 The left ventricular ejection fraction is mildly decreased (45-54%). Nuclear stress EF: 49%. Mid anterior wall hypokinesis. There was no ST segment deviation noted during stress. Defect 1: There is a medium defect of severe severity present in the mid  anterior location. Findings consistent with prior myocardial infarction. This is an intermediate risk study. Old infarct pattern noted in the mid inferior wall distribution. Prior LAD stent placed. No ischemia identified. Past Medical History:  Diagnosis Date   Adenomatous colon polyp 09/07/2004   Amblyopia    Anemia    Arthritis    CAD (coronary artery disease)    cardiac CT with a 50% LAD lesion in 2007   Diverticulosis    DM2 (diabetes mellitus, type 2) (HCC)    Elevated LFTs    secondary to labetalol   Esophageal stricture 2003   External hemorrhoids 2012   lanced by Dr Zachery Dakins   Family history of anesthesia complication    "sister had PONV"   GERD (gastroesophageal reflux disease)    esophageal stricture, pmh   Hiatal hernia 01/04/2000   History of kidney stones 2022   HLD (hyperlipidemia)    HTN (hypertension)    Hypopotassemia     Past Surgical History:  Procedure Laterality Date   BIOPSY  06/05/2021   Procedure: BIOPSY;  Surgeon: Iva Boop, MD;  [facility];  Service: Endoscopy;;   BIOPSY  09/02/2021   Procedure: BIOPSY;  Surgeon: Lemar Lofty., MD;  Location: [facility] ENDOSCOPY;  Service: Gastroenterology;;   COLONOSCOPY  06/27/2004   tics , ADENOMATOUS polyps. F/U due 2011   COLONOSCOPY  06/27/2010  Tics   COLONOSCOPY WITH PROPOFOL N/A 09/02/2021   Procedure: COLONOSCOPY WITH PROPOFOL;  Surgeon: Meridee Score Netty Starring., MD;  Location: [facility] ENDOSCOPY;  Service: Gastroenterology;  Laterality: N/A;   COLONOSCOPY WITH PROPOFOL N/A 04/14/2022   Procedure: COLONOSCOPY WITH PROPOFOL;  Surgeon: Meridee Score Netty Starring., MD;  [facility];  Service: Gastroenterology;  Laterality: N/A;   COLONOSCOPY WITH PROPOFOL N/A 10/13/2022   Procedure: COLONOSCOPY WITH PROPOFOL;  Surgeon: Meridee Score Netty Starring., MD;  [facility];  Service: Gastroenterology;  Laterality: N/A;   CORONARY ANGIOPLASTY WITH STENT PLACEMENT  04/01/2014   "2"    ENDOSCOPIC MUCOSAL RESECTION  09/02/2021   Procedure: ENDOSCOPIC MUCOSAL RESECTION;  Surgeon: Meridee Score Netty Starring., MD;  Location: [facility] ENDOSCOPY;  Service: Gastroenterology;;   ENDOSCOPIC MUCOSAL RESECTION N/A 04/14/2022   Procedure: ENDOSCOPIC MUCOSAL RESECTION;  Surgeon: Lemar Lofty., MD;  [facility];  Service: Gastroenterology;  Laterality: N/A;   ESOPHAGEAL BRUSHING  04/14/2022   Procedure: ESOPHAGEAL BRUSHING;  Surgeon: Lemar Lofty., MD;  [facility];  Service: Gastroenterology;;   ESOPHAGEAL DILATION  06/27/2001   ESOPHAGOGASTRODUODENOSCOPY  06/27/2001   ERD, stricture dilated   ESOPHAGOGASTRODUODENOSCOPY (EGD) WITH PROPOFOL N/A 06/05/2021   Procedure: ESOPHAGOGASTRODUODENOSCOPY (EGD) WITH PROPOFOL;  Surgeon: Iva Boop, MD;  [facility];  Service: Endoscopy;  Laterality: N/A;   ESOPHAGOGASTRODUODENOSCOPY (EGD) WITH PROPOFOL N/A 09/02/2021   Procedure: ESOPHAGOGASTRODUODENOSCOPY (EGD) WITH PROPOFOL;  Surgeon: Meridee Score Netty Starring., MD;  Location: [facility] ENDOSCOPY;  Service: Gastroenterology;  Laterality: N/A;   ESOPHAGOGASTRODUODENOSCOPY (EGD) WITH PROPOFOL N/A 04/14/2022   Procedure: ESOPHAGOGASTRODUODENOSCOPY (EGD) WITH PROPOFOL;  Surgeon: Meridee Score Netty Starring., MD;  [facility];  Service: Gastroenterology;  Laterality: N/A;   EXCISIONAL HEMORRHOIDECTOMY  ~ 2008   FULL THICKNESS RESECTION (FTRD) N/A 10/13/2022   Procedure: FULL THICKNESS RESECTION (FTRD);  Surgeon: Lemar Lofty., MD;  Location: [facility] ENDOSCOPY;  Service: Gastroenterology;  Laterality: N/A;   HEMOSTASIS CLIP PLACEMENT  09/02/2021   Procedure: HEMOSTASIS CLIP PLACEMENT;  Surgeon: Lemar Lofty., MD;  Location: [name] ENDOSCOPY;  Service: Gastroenterology;;   HEMOSTASIS CLIP PLACEMENT  10/13/2022   Procedure: HEMOSTASIS CLIP PLACEMENT;  Surgeon: Lemar Lofty., MD;  Location: [facility] ENDOSCOPY;  Service: Gastroenterology;;   HEMOSTASIS  CONTROL  04/14/2022   Procedure: HEMOSTASIS CONTROL;  Surgeon: Lemar Lofty., MD;  Location: [facility] ENDOSCOPY;  Service: Gastroenterology;;   HEMOSTASIS CONTROL  10/13/2022   Procedure: HEMOSTASIS CONTROL;  Surgeon: Lemar Lofty., MD;  [facility];  Service: Gastroenterology;;   HOT HEMOSTASIS N/A 09/02/2021   Procedure: HOT HEMOSTASIS (ARGON PLASMA COAGULATION/BICAP);  Surgeon: Lemar Lofty., MD;  Location: [facility] ENDOSCOPY;  Service: Gastroenterology;  Laterality: N/A;   HOT HEMOSTASIS N/A 04/14/2022   Procedure: HOT HEMOSTASIS (ARGON PLASMA COAGULATION/BICAP);  Surgeon: Lemar Lofty., MD;  Location: [facility] ENDOSCOPY;  Service: Gastroenterology;  Laterality: N/A;   LEFT HEART CATHETERIZATION WITH CORONARY ANGIOGRAM N/A 03/26/2014   Procedure: LEFT HEART CATHETERIZATION WITH CORONARY ANGIOGRAM;  Surgeon: Peter M Swaziland, MD;  Location: [facility] CATH LAB;  Service: Cardiovascular;  Laterality: N/A;   PERCUTANEOUS CORONARY STENT INTERVENTION (PCI-S) N/A 04/01/2014   Procedure: PERCUTANEOUS CORONARY STENT INTERVENTION (PCI-S);  Surgeon: Peter M Swaziland, MD;  Location: [facility] CATH LAB;  Service: Cardiovascular;  Laterality: N/A;   POLYPECTOMY  10/13/2022   Procedure: POLYPECTOMY;  Surgeon: Meridee Score Netty Starring., MD;  Location: [facility] ENDOSCOPY;  Service: Gastroenterology;;   POSTERIOR LAMINECTOMY / DECOMPRESSION LUMBAR SPINE  12/25/2009   L4 , Dr Berniece Salines  10/13/2022   Procedure: Susa Day;  Surgeon: Mansouraty, Netty Starring., MD;  Location: [facility] ENDOSCOPY;  Service: Gastroenterology;;   SHOULDER ARTHROSCOPY WITH ROTATOR CUFF REPAIR AND OPEN BICEPS TENODESIS Left 02/04/2019   Procedure: LEFT SHOULDER ARTHROSCOPY, BICEPS TENODESIS, MINI OPEN ROTATOR CUFF TEAR REPAIR;  Surgeon: Cammy Copa, MD;  Location: [facility];  Service: Orthopedics;  Laterality: Left;   SUBMUCOSAL INJECTION  09/02/2021   Procedure: SUBMUCOSAL INJECTION;  Surgeon:  Meridee Score Netty Starring., MD;  Location: [facility] ENDOSCOPY;  Service: Gastroenterology;;   SUBMUCOSAL LIFTING INJECTION  04/14/2022   Procedure: SUBMUCOSAL LIFTING INJECTION;  Surgeon: Lemar Lofty., MD;  Location: [facility] ENDOSCOPY;  Service: Gastroenterology;;   TOTAL KNEE ARTHROPLASTY Right 05/11/2021   Procedure: TOTAL KNEE ARTHROPLASTY;  Surgeon: Durene Romans, MD;  [facility];  Service: Orthopedics;  Laterality: Right;    MEDICATIONS:  Accu-Chek Softclix Lancets lancets   atorvastatin (LIPITOR) 80 MG tablet   Blood Glucose Monitoring Suppl (ACCU-CHEK AVIVA PLUS) w/Device KIT   carvedilol (COREG) 3.125 MG tablet   clopidogrel (PLAVIX) 75 MG tablet   Coenzyme Q10 (COQ10) 100 MG CAPS   ferrous sulfate 325 (65 FE) MG tablet   FIBER PO   glucose blood test strip   glucose blood test strip   hydrochlorothiazide (HYDRODIURIL) 25 MG tablet   irbesartan (AVAPRO) 300 MG tablet   metFORMIN (GLUCOPHAGE-XR) 500 MG 24 hr tablet   Multiple Vitamins-Minerals (MULTIVITAMIN WITH MINERALS) tablet   nitroGLYCERIN (NITROSTAT) 0.4 MG SL tablet   pantoprazole (PROTONIX) 40 MG tablet   Propylene Glycol, PF, (SYSTANE COMPLETE PF) 0.6 % SOLN   sildenafil (REVATIO) 20 MG tablet   spironolactone (ALDACTONE) 25 MG tablet   Undecylenic Ac-Zn Undecylenate (FUNGI-NAIL TOE & FOOT EX)   vitamin B-12 (CYANOCOBALAMIN) 1000 MCG tablet   Whey Protein POWD   No current facility-administered medications for this encounter.     Jodell Cipro Ward, PA-C [facility] [phone]

## 2022-12-27 NOTE — Anesthesia Preprocedure Evaluation (Addendum)
Anesthesia Evaluation  Patient identified by MRN, date of birth, ID band Patient awake    Reviewed: Allergy & Precautions, NPO status , Patient's Chart, lab work & pertinent test results, reviewed documented beta blocker date and time   Airway Mallampati: III  TM Distance: >3 FB Neck ROM: Full    Dental no notable dental hx. (+) Teeth Intact, Dental Advisory Given   Pulmonary neg pulmonary ROS   Pulmonary exam normal breath sounds clear to auscultation       Cardiovascular hypertension, Pt. on home beta blockers and Pt. on medications + CAD and + Cardiac Stents  Normal cardiovascular exam Rhythm:Regular Rate:Normal  Myocardial Perfusion 09/23/2020  The left ventricular ejection fraction is mildly decreased (45-54%).  Nuclear stress EF: 49%. Mid anterior wall hypokinesis.  There was no ST segment deviation noted during stress.  Defect 1: There is a medium defect of severe severity present in the mid anterior location.  Findings consistent with prior myocardial infarction.  This is an intermediate risk study. Old infarct pattern noted in the mid inferior wall distribution. Prior LAD stent placed. No ischemia identified.    Neuro/Psych negative neurological ROS  negative psych ROS   GI/Hepatic Neg liver ROS, hiatal hernia, PUD,GERD  ,,  Endo/Other  diabetes, Type 2, Oral Hypoglycemic Agents    Renal/GU negative Renal ROS  negative genitourinary   Musculoskeletal  (+) Arthritis ,    Abdominal   Peds  Hematology  (+) Blood dyscrasia (plavix, LD 7/7)   Anesthesia Other Findings   Reproductive/Obstetrics                             Anesthesia Physical Anesthesia Plan  ASA: 3  Anesthesia Plan: General   Post-op Pain Management: Tylenol PO (pre-op)*   Induction: Intravenous  PONV Risk Score and Plan: 2 and Dexamethasone, Ondansetron and Treatment may vary due to age or medical  condition  Airway Management Planned: Oral ETT  Additional Equipment:   Intra-op Plan:   Post-operative Plan: Extubation in OR  Informed Consent: I have reviewed the patients History and Physical, chart, labs and discussed the procedure including the risks, benefits and alternatives for the proposed anesthesia with the patient or authorized representative who has indicated his/her understanding and acceptance.     Dental advisory given  Plan Discussed with: CRNA  Anesthesia Plan Comments: (2 IVs)       Anesthesia Quick Evaluation

## 2022-12-28 DIAGNOSIS — D1801 Hemangioma of skin and subcutaneous tissue: Secondary | ICD-10-CM | POA: Diagnosis not present

## 2022-12-28 DIAGNOSIS — D2271 Melanocytic nevi of right lower limb, including hip: Secondary | ICD-10-CM | POA: Diagnosis not present

## 2022-12-28 DIAGNOSIS — D2372 Other benign neoplasm of skin of left lower limb, including hip: Secondary | ICD-10-CM | POA: Diagnosis not present

## 2022-12-28 DIAGNOSIS — D225 Melanocytic nevi of trunk: Secondary | ICD-10-CM | POA: Diagnosis not present

## 2022-12-28 DIAGNOSIS — D2272 Melanocytic nevi of left lower limb, including hip: Secondary | ICD-10-CM | POA: Diagnosis not present

## 2022-12-28 DIAGNOSIS — L738 Other specified follicular disorders: Secondary | ICD-10-CM | POA: Diagnosis not present

## 2022-12-28 DIAGNOSIS — L821 Other seborrheic keratosis: Secondary | ICD-10-CM | POA: Diagnosis not present

## 2023-01-04 NOTE — Progress Notes (Signed)
Spoke with Pt twice regarding his potential COVID exposure 01/03/23.  His friend tested positive 01/04/23, after Pt spent one hour with him, in same room, on 01/03/23.  Consulted anesthesia and Dr Michaell Cowing, who both stated that they would not cancel case based on this information.  Pt had a negative home COVID result today.  Relayed information to Pt, and also explained it would be his decision if he wanted to reschedule case, Dr Michaell Cowing informed that it may be up to 2 months before he could be rescheduled.  Pt decided to plan on coming at scheduled time and would notify us if he became symptomatic.

## 2023-01-05 ENCOUNTER — Other Ambulatory Visit: Payer: Self-pay

## 2023-01-05 ENCOUNTER — Inpatient Hospital Stay (HOSPITAL_COMMUNITY): Payer: Medicare Other | Admitting: Anesthesiology

## 2023-01-05 ENCOUNTER — Encounter (HOSPITAL_COMMUNITY): Payer: Self-pay | Admitting: Surgery

## 2023-01-05 ENCOUNTER — Encounter (HOSPITAL_COMMUNITY)
Admission: RE | Disposition: A | Discharge status: Home / Self Care | Payer: Self-pay | Source: Ambulatory Visit | Attending: Surgery

## 2023-01-05 ENCOUNTER — Inpatient Hospital Stay (HOSPITAL_COMMUNITY)
Admission: RE | Admit: 2023-01-05 | Discharge: 2023-01-11 | DRG: 331 | Disposition: A | Discharge status: Home / Self Care | Payer: Medicare Other | Source: Ambulatory Visit | Attending: Surgery | Admitting: Surgery

## 2023-01-05 ENCOUNTER — Inpatient Hospital Stay (HOSPITAL_COMMUNITY): Payer: Medicare Other | Admitting: Physician Assistant

## 2023-01-05 DIAGNOSIS — Z7901 Long term (current) use of anticoagulants: Secondary | ICD-10-CM | POA: Diagnosis not present

## 2023-01-05 DIAGNOSIS — Z96651 Presence of right artificial knee joint: Secondary | ICD-10-CM | POA: Diagnosis present

## 2023-01-05 DIAGNOSIS — Z8042 Family history of malignant neoplasm of prostate: Secondary | ICD-10-CM

## 2023-01-05 DIAGNOSIS — E1169 Type 2 diabetes mellitus with other specified complication: Secondary | ICD-10-CM | POA: Diagnosis present

## 2023-01-05 DIAGNOSIS — E119 Type 2 diabetes mellitus without complications: Secondary | ICD-10-CM | POA: Diagnosis not present

## 2023-01-05 DIAGNOSIS — Z8719 Personal history of other diseases of the digestive system: Secondary | ICD-10-CM | POA: Diagnosis not present

## 2023-01-05 DIAGNOSIS — I152 Hypertension secondary to endocrine disorders: Secondary | ICD-10-CM | POA: Diagnosis present

## 2023-01-05 DIAGNOSIS — Z7984 Long term (current) use of oral hypoglycemic drugs: Secondary | ICD-10-CM | POA: Diagnosis not present

## 2023-01-05 DIAGNOSIS — Z8249 Family history of ischemic heart disease and other diseases of the circulatory system: Secondary | ICD-10-CM

## 2023-01-05 DIAGNOSIS — K635 Polyp of colon: Secondary | ICD-10-CM | POA: Diagnosis not present

## 2023-01-05 DIAGNOSIS — K429 Umbilical hernia without obstruction or gangrene: Secondary | ICD-10-CM | POA: Diagnosis present

## 2023-01-05 DIAGNOSIS — Z8711 Personal history of peptic ulcer disease: Secondary | ICD-10-CM | POA: Diagnosis not present

## 2023-01-05 DIAGNOSIS — E785 Hyperlipidemia, unspecified: Secondary | ICD-10-CM | POA: Diagnosis present

## 2023-01-05 DIAGNOSIS — K279 Peptic ulcer, site unspecified, unspecified as acute or chronic, without hemorrhage or perforation: Secondary | ICD-10-CM | POA: Diagnosis present

## 2023-01-05 DIAGNOSIS — E1159 Type 2 diabetes mellitus with other circulatory complications: Secondary | ICD-10-CM | POA: Diagnosis present

## 2023-01-05 DIAGNOSIS — D509 Iron deficiency anemia, unspecified: Secondary | ICD-10-CM | POA: Diagnosis present

## 2023-01-05 DIAGNOSIS — D122 Benign neoplasm of ascending colon: Secondary | ICD-10-CM | POA: Diagnosis present

## 2023-01-05 DIAGNOSIS — D490 Neoplasm of unspecified behavior of digestive system: Secondary | ICD-10-CM | POA: Diagnosis not present

## 2023-01-05 DIAGNOSIS — K219 Gastro-esophageal reflux disease without esophagitis: Secondary | ICD-10-CM | POA: Diagnosis present

## 2023-01-05 DIAGNOSIS — Z955 Presence of coronary angioplasty implant and graft: Secondary | ICD-10-CM | POA: Diagnosis not present

## 2023-01-05 DIAGNOSIS — Z7902 Long term (current) use of antithrombotics/antiplatelets: Secondary | ICD-10-CM | POA: Diagnosis not present

## 2023-01-05 DIAGNOSIS — Z86010 Personal history of colonic polyps: Secondary | ICD-10-CM | POA: Diagnosis not present

## 2023-01-05 DIAGNOSIS — I2510 Atherosclerotic heart disease of native coronary artery without angina pectoris: Secondary | ICD-10-CM | POA: Diagnosis not present

## 2023-01-05 DIAGNOSIS — Z79899 Other long term (current) drug therapy: Secondary | ICD-10-CM

## 2023-01-05 DIAGNOSIS — I10 Essential (primary) hypertension: Secondary | ICD-10-CM | POA: Diagnosis not present

## 2023-01-05 DIAGNOSIS — Z823 Family history of stroke: Secondary | ICD-10-CM | POA: Diagnosis not present

## 2023-01-05 DIAGNOSIS — Z833 Family history of diabetes mellitus: Secondary | ICD-10-CM

## 2023-01-05 DIAGNOSIS — D126 Benign neoplasm of colon, unspecified: Principal | ICD-10-CM | POA: Diagnosis present

## 2023-01-05 HISTORY — PX: PARTIAL COLECTOMY: SHX5273

## 2023-01-05 LAB — GLUCOSE, CAPILLARY
Glucose-Capillary: 182 mg/dL — ABNORMAL HIGH (ref 70–99)
Glucose-Capillary: 186 mg/dL — ABNORMAL HIGH (ref 70–99)
Glucose-Capillary: 176 mg/dL — ABNORMAL HIGH (ref 70–99)
Glucose-Capillary: 207 mg/dL — ABNORMAL HIGH (ref 70–99)

## 2023-01-05 LAB — TYPE AND SCREEN: Antibody Screen: NEGATIVE

## 2023-01-05 SURGERY — COLECTOMY, SIGMOID, ROBOT-ASSISTED
Anesthesia: General

## 2023-01-05 MED ORDER — SODIUM CHLORIDE 0.9% FLUSH: 3.0000 mL | INTRAVENOUS | Status: DC | PRN

## 2023-01-05 MED ORDER — HYDROMORPHONE HCL 1 MG/ML IJ SOLN
INTRAMUSCULAR | Status: DC | PRN
  Administered 2023-01-05 (×2): .5 mg via INTRAVENOUS

## 2023-01-05 MED ORDER — PHENOL 1.4 % MT LIQD: 2.0000 | OROMUCOSAL | Status: DC | PRN

## 2023-01-05 MED ORDER — PHENYLEPHRINE 80 MCG/ML (10ML) SYRINGE FOR IV PUSH (FOR BLOOD PRESSURE SUPPORT)
PREFILLED_SYRINGE | INTRAVENOUS | Status: AC
  Filled 2023-01-05: qty 10

## 2023-01-05 MED ORDER — ACETAMINOPHEN 500 MG PO TABS
1000.0000 mg | ORAL_TABLET | Freq: Four times a day (QID) | ORAL | Status: DC
  Administered 2023-01-05 – 2023-01-11 (×20): 1000 mg via ORAL
  Filled 2023-01-05 (×23): qty 2

## 2023-01-05 MED ORDER — NEOMYCIN SULFATE 500 MG PO TABS: 1000.0000 mg | ORAL_TABLET | ORAL | Status: DC

## 2023-01-05 MED ORDER — ENSURE PRE-SURGERY PO LIQD: 296.0000 mL | Freq: Once | ORAL | Status: DC

## 2023-01-05 MED ORDER — ENSURE SURGERY PO LIQD
237.0000 mL | Freq: Two times a day (BID) | ORAL | Status: DC
  Administered 2023-01-06 – 2023-01-09 (×5): 237 mL via ORAL

## 2023-01-05 MED ORDER — DIPHENHYDRAMINE HCL 50 MG/ML IJ SOLN: 12.5000 mg | Freq: Four times a day (QID) | INTRAMUSCULAR | Status: DC | PRN

## 2023-01-05 MED ORDER — ALVIMOPAN 12 MG PO CAPS
12.0000 mg | ORAL_CAPSULE | ORAL | Status: AC
  Administered 2023-01-05: 12 mg via ORAL
  Filled 2023-01-05: qty 1

## 2023-01-05 MED ORDER — EPHEDRINE SULFATE-NACL 50-0.9 MG/10ML-% IV SOSY
PREFILLED_SYRINGE | INTRAVENOUS | Status: DC | PRN
  Administered 2023-01-05: 5 mg via INTRAVENOUS
  Administered 2023-01-05: 10 mg via INTRAVENOUS

## 2023-01-05 MED ORDER — HYDRALAZINE HCL 20 MG/ML IJ SOLN: 10.0000 mg | INTRAMUSCULAR | Status: DC | PRN

## 2023-01-05 MED ORDER — BUPIVACAINE-EPINEPHRINE 0.25% -1:200000 IJ SOLN
INTRAMUSCULAR | Status: AC
  Filled 2023-01-05: qty 1

## 2023-01-05 MED ORDER — HYDROMORPHONE HCL 2 MG/ML IJ SOLN
INTRAMUSCULAR | Status: AC
  Filled 2023-01-05: qty 1

## 2023-01-05 MED ORDER — SUGAMMADEX SODIUM 200 MG/2ML IV SOLN
INTRAVENOUS | Status: DC | PRN
  Administered 2023-01-05: 200 mg via INTRAVENOUS

## 2023-01-05 MED ORDER — DIPHENHYDRAMINE HCL 12.5 MG/5ML PO ELIX: 12.5000 mg | ORAL_SOLUTION | Freq: Four times a day (QID) | ORAL | Status: DC | PRN

## 2023-01-05 MED ORDER — FENTANYL CITRATE PF 50 MCG/ML IJ SOSY
25.0000 ug | PREFILLED_SYRINGE | INTRAMUSCULAR | Status: DC | PRN
  Administered 2023-01-05: 25 ug via INTRAVENOUS

## 2023-01-05 MED ORDER — DEXAMETHASONE SODIUM PHOSPHATE 10 MG/ML IJ SOLN
INTRAMUSCULAR | Status: AC
  Filled 2023-01-05: qty 1

## 2023-01-05 MED ORDER — PHENYLEPHRINE 80 MCG/ML (10ML) SYRINGE FOR IV PUSH (FOR BLOOD PRESSURE SUPPORT)
PREFILLED_SYRINGE | INTRAVENOUS | Status: DC | PRN
  Administered 2023-01-05: 80 ug via INTRAVENOUS
  Administered 2023-01-05 (×3): 160 ug via INTRAVENOUS
  Administered 2023-01-05: 80 ug via INTRAVENOUS
  Administered 2023-01-05 (×2): 160 ug via INTRAVENOUS

## 2023-01-05 MED ORDER — LACTATED RINGERS IV SOLN: INTRAVENOUS | Status: DC | PRN

## 2023-01-05 MED ORDER — SIMETHICONE 80 MG PO CHEW
40.0000 mg | CHEWABLE_TABLET | Freq: Four times a day (QID) | ORAL | Status: DC | PRN
  Administered 2023-01-08: 40 mg via ORAL
  Filled 2023-01-05 (×2): qty 1

## 2023-01-05 MED ORDER — BUPIVACAINE LIPOSOME 1.3 % IJ SUSP
INTRAMUSCULAR | Status: AC
  Filled 2023-01-05: qty 20

## 2023-01-05 MED ORDER — ONDANSETRON HCL 4 MG PO TABS: 4.0000 mg | ORAL_TABLET | Freq: Four times a day (QID) | ORAL | Status: DC | PRN

## 2023-01-05 MED ORDER — PROCHLORPERAZINE EDISYLATE 10 MG/2ML IJ SOLN: 5.0000 mg | Freq: Four times a day (QID) | INTRAMUSCULAR | Status: DC | PRN

## 2023-01-05 MED ORDER — PHENYLEPHRINE HCL-NACL 20-0.9 MG/250ML-% IV SOLN
INTRAVENOUS | Status: DC | PRN
  Administered 2023-01-05: 40 ug/min via INTRAVENOUS

## 2023-01-05 MED ORDER — PANTOPRAZOLE SODIUM 40 MG PO TBEC
40.0000 mg | DELAYED_RELEASE_TABLET | Freq: Every day | ORAL | Status: DC
  Administered 2023-01-05 – 2023-01-11 (×7): 40 mg via ORAL
  Filled 2023-01-05 (×7): qty 1

## 2023-01-05 MED ORDER — ONDANSETRON HCL 4 MG/2ML IJ SOLN
INTRAMUSCULAR | Status: AC
  Filled 2023-01-05: qty 2

## 2023-01-05 MED ORDER — HYDROMORPHONE HCL 1 MG/ML IJ SOLN
0.5000 mg | INTRAMUSCULAR | Status: DC | PRN
  Administered 2023-01-06: 1 mg via INTRAVENOUS
  Filled 2023-01-05: qty 1

## 2023-01-05 MED ORDER — ENOXAPARIN SODIUM 40 MG/0.4ML IJ SOSY
40.0000 mg | PREFILLED_SYRINGE | INTRAMUSCULAR | Status: DC
  Administered 2023-01-06 – 2023-01-11 (×6): 40 mg via SUBCUTANEOUS
  Filled 2023-01-05 (×6): qty 0.4

## 2023-01-05 MED ORDER — DEXAMETHASONE SODIUM PHOSPHATE 10 MG/ML IJ SOLN
INTRAMUSCULAR | Status: DC | PRN
  Administered 2023-01-05: 6 mg via INTRAVENOUS

## 2023-01-05 MED ORDER — ENOXAPARIN SODIUM 40 MG/0.4ML IJ SOSY
40.0000 mg | PREFILLED_SYRINGE | Freq: Once | INTRAMUSCULAR | Status: AC
  Administered 2023-01-05: 40 mg via SUBCUTANEOUS
  Filled 2023-01-05: qty 0.4

## 2023-01-05 MED ORDER — SODIUM CHLORIDE 0.9% FLUSH
3.0000 mL | Freq: Two times a day (BID) | INTRAVENOUS | Status: DC
  Administered 2023-01-05 – 2023-01-11 (×12): 3 mL via INTRAVENOUS

## 2023-01-05 MED ORDER — TRAMADOL HCL 50 MG PO TABS
50.0000 mg | ORAL_TABLET | Freq: Four times a day (QID) | ORAL | Status: DC | PRN
  Administered 2023-01-05: 50 mg via ORAL
  Administered 2023-01-06 – 2023-01-10 (×11): 100 mg via ORAL
  Filled 2023-01-05 (×8): qty 2
  Filled 2023-01-05: qty 1
  Filled 2023-01-05 (×3): qty 2

## 2023-01-05 MED ORDER — LACTATED RINGERS IV BOLUS: 1000.0000 mL | Freq: Three times a day (TID) | INTRAVENOUS | Status: AC | PRN

## 2023-01-05 MED ORDER — PROPOFOL 10 MG/ML IV BOLUS
INTRAVENOUS | Status: DC | PRN
Start: 2023-01-05 — End: 2023-01-05
  Administered 2023-01-05: 130 mg via INTRAVENOUS

## 2023-01-05 MED ORDER — METHOCARBAMOL 500 MG PO TABS
1000.0000 mg | ORAL_TABLET | Freq: Four times a day (QID) | ORAL | Status: DC | PRN
  Administered 2023-01-06: 1000 mg via ORAL
  Filled 2023-01-05: qty 2

## 2023-01-05 MED ORDER — BISACODYL 5 MG PO TBEC: 20.0000 mg | DELAYED_RELEASE_TABLET | Freq: Once | ORAL | Status: DC

## 2023-01-05 MED ORDER — SODIUM CHLORIDE 0.9 % IV SOLN
2.0000 g | INTRAVENOUS | Status: AC
  Administered 2023-01-05: 2 g via INTRAVENOUS
  Filled 2023-01-05: qty 2

## 2023-01-05 MED ORDER — METOPROLOL TARTRATE 5 MG/5ML IV SOLN: 5.0000 mg | Freq: Four times a day (QID) | INTRAVENOUS | Status: DC | PRN

## 2023-01-05 MED ORDER — TRAMADOL HCL 50 MG PO TABS: 50.0000 mg | ORAL_TABLET | Freq: Four times a day (QID) | ORAL | 0 refills | Status: DC | PRN

## 2023-01-05 MED ORDER — LACTATED RINGERS IR SOLN
Status: DC | PRN
  Administered 2023-01-05: 1000 mL

## 2023-01-05 MED ORDER — PROCHLORPERAZINE MALEATE 10 MG PO TABS: 10.0000 mg | ORAL_TABLET | Freq: Four times a day (QID) | ORAL | Status: DC | PRN

## 2023-01-05 MED ORDER — LACTATED RINGERS IV SOLN: INTRAVENOUS | Status: AC

## 2023-01-05 MED ORDER — LACTATED RINGERS IV SOLN: INTRAVENOUS | Status: DC

## 2023-01-05 MED ORDER — CARVEDILOL 3.125 MG PO TABS
3.1250 mg | ORAL_TABLET | Freq: Two times a day (BID) | ORAL | Status: DC
  Administered 2023-01-05 – 2023-01-11 (×12): 3.125 mg via ORAL
  Filled 2023-01-05 (×12): qty 1

## 2023-01-05 MED ORDER — LIDOCAINE HCL (PF) 2 % IJ SOLN
INTRAMUSCULAR | Status: AC
  Filled 2023-01-05: qty 5

## 2023-01-05 MED ORDER — ACETAMINOPHEN 500 MG PO TABS
1000.0000 mg | ORAL_TABLET | ORAL | Status: AC
  Administered 2023-01-05: 1000 mg via ORAL
  Filled 2023-01-05: qty 2

## 2023-01-05 MED ORDER — NITROGLYCERIN 0.4 MG SL SUBL: 0.4000 mg | SUBLINGUAL_TABLET | SUBLINGUAL | Status: DC | PRN

## 2023-01-05 MED ORDER — MAGIC MOUTHWASH: 15.0000 mL | Freq: Four times a day (QID) | ORAL | Status: DC | PRN

## 2023-01-05 MED ORDER — CHLORHEXIDINE GLUCONATE 0.12 % MT SOLN
15.0000 mL | Freq: Once | OROMUCOSAL | Status: AC
  Administered 2023-01-05: 15 mL via OROMUCOSAL

## 2023-01-05 MED ORDER — ONDANSETRON HCL 4 MG/2ML IJ SOLN
INTRAMUSCULAR | Status: DC | PRN
  Administered 2023-01-05: 4 mg via INTRAVENOUS

## 2023-01-05 MED ORDER — INSULIN ASPART 100 UNIT/ML IJ SOLN
0.0000 [IU] | Freq: Every day | INTRAMUSCULAR | Status: DC
  Administered 2023-01-05: 2 [IU] via SUBCUTANEOUS

## 2023-01-05 MED ORDER — ENALAPRILAT 1.25 MG/ML IV SOLN: 0.6250 mg | Freq: Four times a day (QID) | INTRAVENOUS | Status: DC | PRN

## 2023-01-05 MED ORDER — ONDANSETRON HCL 4 MG/2ML IJ SOLN
4.0000 mg | Freq: Four times a day (QID) | INTRAMUSCULAR | Status: DC | PRN
  Administered 2023-01-06: 4 mg via INTRAVENOUS
  Filled 2023-01-05: qty 2

## 2023-01-05 MED ORDER — CALCIUM POLYCARBOPHIL 625 MG PO TABS
625.0000 mg | ORAL_TABLET | Freq: Two times a day (BID) | ORAL | Status: DC
  Administered 2023-01-05 – 2023-01-11 (×13): 625 mg via ORAL
  Filled 2023-01-05 (×13): qty 1

## 2023-01-05 MED ORDER — ROCURONIUM BROMIDE 10 MG/ML (PF) SYRINGE
PREFILLED_SYRINGE | INTRAVENOUS | Status: DC | PRN
  Administered 2023-01-05: 100 mg via INTRAVENOUS

## 2023-01-05 MED ORDER — BUPIVACAINE LIPOSOME 1.3 % IJ SUSP: 20.0000 mL | Freq: Once | INTRAMUSCULAR | Status: DC

## 2023-01-05 MED ORDER — SODIUM CHLORIDE 0.9 % IV SOLN: 250.0000 mL | INTRAVENOUS | Status: DC | PRN

## 2023-01-05 MED ORDER — POLYETHYLENE GLYCOL 3350 17 GM/SCOOP PO POWD: 1.0000 | Freq: Once | ORAL | Status: DC

## 2023-01-05 MED ORDER — FENTANYL CITRATE (PF) 100 MCG/2ML IJ SOLN
INTRAMUSCULAR | Status: DC | PRN
  Administered 2023-01-05 (×2): 50 ug via INTRAVENOUS
  Administered 2023-01-05: 100 ug via INTRAVENOUS

## 2023-01-05 MED ORDER — SPIRONOLACTONE 25 MG PO TABS
25.0000 mg | ORAL_TABLET | Freq: Every day | ORAL | Status: DC
  Administered 2023-01-05 – 2023-01-11 (×7): 25 mg via ORAL
  Filled 2023-01-05 (×7): qty 1

## 2023-01-05 MED ORDER — LIDOCAINE HCL (CARDIAC) PF 100 MG/5ML IV SOSY
PREFILLED_SYRINGE | INTRAVENOUS | Status: DC | PRN
  Administered 2023-01-05: 60 mg via INTRATRACHEAL

## 2023-01-05 MED ORDER — SODIUM CHLORIDE 0.9 % IV SOLN
2.0000 g | Freq: Two times a day (BID) | INTRAVENOUS | Status: AC
  Administered 2023-01-05: 2 g via INTRAVENOUS
  Filled 2023-01-05: qty 2

## 2023-01-05 MED ORDER — BUPIVACAINE LIPOSOME 1.3 % IJ SUSP
INTRAMUSCULAR | Status: DC | PRN
  Administered 2023-01-05: 20 mL

## 2023-01-05 MED ORDER — ALUM & MAG HYDROXIDE-SIMETH 200-200-20 MG/5ML PO SUSP: 30.0000 mL | Freq: Four times a day (QID) | ORAL | Status: DC | PRN

## 2023-01-05 MED ORDER — MELATONIN 3 MG PO TABS
3.0000 mg | ORAL_TABLET | Freq: Every evening | ORAL | Status: DC | PRN
  Administered 2023-01-08: 3 mg via ORAL
  Filled 2023-01-05 (×2): qty 1

## 2023-01-05 MED ORDER — 0.9 % SODIUM CHLORIDE (POUR BTL) OPTIME
TOPICAL | Status: DC | PRN
  Administered 2023-01-05: 2000 mL

## 2023-01-05 MED ORDER — ENSURE PRE-SURGERY PO LIQD: 592.0000 mL | Freq: Once | ORAL | Status: DC

## 2023-01-05 MED ORDER — INSULIN ASPART 100 UNIT/ML IJ SOLN
0.0000 [IU] | Freq: Three times a day (TID) | INTRAMUSCULAR | Status: DC
  Administered 2023-01-05: 3 [IU] via SUBCUTANEOUS
  Administered 2023-01-06 (×2): 5 [IU] via SUBCUTANEOUS
  Administered 2023-01-06: 2 [IU] via SUBCUTANEOUS
  Administered 2023-01-07: 5 [IU] via SUBCUTANEOUS
  Administered 2023-01-07: 2 [IU] via SUBCUTANEOUS
  Administered 2023-01-07: 5 [IU] via SUBCUTANEOUS
  Administered 2023-01-08 (×3): 3 [IU] via SUBCUTANEOUS
  Administered 2023-01-09: 2 [IU] via SUBCUTANEOUS
  Administered 2023-01-10: 3 [IU] via SUBCUTANEOUS

## 2023-01-05 MED ORDER — METHOCARBAMOL 1000 MG/10ML IJ SOLN: 1000.0000 mg | Freq: Four times a day (QID) | INTRAVENOUS | Status: DC | PRN

## 2023-01-05 MED ORDER — FENTANYL CITRATE (PF) 100 MCG/2ML IJ SOLN
INTRAMUSCULAR | Status: AC
  Filled 2023-01-05: qty 2

## 2023-01-05 MED ORDER — BUPIVACAINE-EPINEPHRINE (PF) 0.25% -1:200000 IJ SOLN
INTRAMUSCULAR | Status: DC | PRN
  Administered 2023-01-05: 50 mL

## 2023-01-05 MED ORDER — GABAPENTIN 300 MG PO CAPS
300.0000 mg | ORAL_CAPSULE | ORAL | Status: AC
  Administered 2023-01-05: 300 mg via ORAL
  Filled 2023-01-05: qty 1

## 2023-01-05 MED ORDER — PROPOFOL 10 MG/ML IV BOLUS
INTRAVENOUS | Status: AC
  Filled 2023-01-05: qty 20

## 2023-01-05 MED ORDER — FENTANYL CITRATE PF 50 MCG/ML IJ SOSY
PREFILLED_SYRINGE | INTRAMUSCULAR | Status: AC
  Administered 2023-01-05: 25 ug via INTRAVENOUS
  Filled 2023-01-05: qty 1

## 2023-01-05 MED ORDER — ORAL CARE MOUTH RINSE: 15.0000 mL | Freq: Once | OROMUCOSAL | Status: AC

## 2023-01-05 MED ORDER — PHENYLEPHRINE HCL-NACL 20-0.9 MG/250ML-% IV SOLN
INTRAVENOUS | Status: AC
  Filled 2023-01-05: qty 250

## 2023-01-05 MED ORDER — ALVIMOPAN 12 MG PO CAPS
12.0000 mg | ORAL_CAPSULE | Freq: Two times a day (BID) | ORAL | Status: DC
  Administered 2023-01-06 – 2023-01-09 (×6): 12 mg via ORAL
  Filled 2023-01-05 (×8): qty 1

## 2023-01-05 MED ORDER — MENTHOL 3 MG MT LOZG: 1.0000 | LOZENGE | OROMUCOSAL | Status: DC | PRN

## 2023-01-05 MED ORDER — VITAMIN B-12 1000 MCG PO TABS
1000.0000 ug | ORAL_TABLET | Freq: Every day | ORAL | Status: DC
  Administered 2023-01-05 – 2023-01-11 (×7): 1000 ug via ORAL
  Filled 2023-01-05 (×7): qty 1

## 2023-01-05 MED ORDER — ROCURONIUM BROMIDE 10 MG/ML (PF) SYRINGE
PREFILLED_SYRINGE | INTRAVENOUS | Status: AC
  Filled 2023-01-05: qty 10

## 2023-01-05 MED ORDER — METRONIDAZOLE 500 MG PO TABS: 1000.0000 mg | ORAL_TABLET | ORAL | Status: DC

## 2023-01-05 MED ORDER — ATORVASTATIN CALCIUM 20 MG PO TABS
80.0000 mg | ORAL_TABLET | Freq: Every evening | ORAL | Status: DC
  Administered 2023-01-05 – 2023-01-10 (×6): 80 mg via ORAL
  Filled 2023-01-05 (×7): qty 4

## 2023-01-05 SURGICAL SUPPLY — 112 items
APL PRP STRL LF DISP 70% ISPRP (MISCELLANEOUS)
APPLIER CLIP 5 13 M/L LIGAMAX5 (MISCELLANEOUS)
APPLIER CLIP ROT 10 11.4 M/L (STAPLE)
APR CLP MED LRG 11.4X10 (STAPLE)
APR CLP MED LRG 5 ANG JAW (MISCELLANEOUS)
BAG COUNTER SPONGE SURGICOUNT (BAG) ×1 IMPLANT
BAG SPNG CNTER NS LX DISP (BAG) ×1
BLADE EXTENDED COATED 6.5IN (ELECTRODE) IMPLANT
CANNULA REDUCER 12-8 DVNC XI (CANNULA) IMPLANT
CELLS DAT CNTRL 66122 CELL SVR (MISCELLANEOUS) ×1 IMPLANT
CHLORAPREP W/TINT 26 (MISCELLANEOUS) IMPLANT
CLIP APPLIE 5 13 M/L LIGAMAX5 (MISCELLANEOUS) IMPLANT
CLIP APPLIE ROT 10 11.4 M/L (STAPLE) IMPLANT
COVER SURGICAL LIGHT HANDLE (MISCELLANEOUS) ×2 IMPLANT
COVER TIP SHEARS 8 DVNC (MISCELLANEOUS) ×1 IMPLANT
DEVICE TROCAR PUNCTURE CLOSURE (ENDOMECHANICALS) IMPLANT
DRAIN CHANNEL 19F RND (DRAIN) IMPLANT
DRAPE ARM DVNC X/XI (DISPOSABLE) ×4 IMPLANT
DRAPE COLUMN DVNC XI (DISPOSABLE) ×1 IMPLANT
DRAPE SURG IRRIG POUCH 19X23 (DRAPES) ×1 IMPLANT
DRIVER NDL LRG 8 DVNC XI (INSTRUMENTS) ×1 IMPLANT
DRIVER NDL MEGA SUTCUT DVNCXI (INSTRUMENTS) IMPLANT
DRIVER NDLE LRG 8 DVNC XI (INSTRUMENTS) IMPLANT
DRIVER NDLE MEGA SUTCUT DVNCXI (INSTRUMENTS) ×1 IMPLANT
DRSG OPSITE POSTOP 4X10 (GAUZE/BANDAGES/DRESSINGS) IMPLANT
DRSG OPSITE POSTOP 4X6 (GAUZE/BANDAGES/DRESSINGS) IMPLANT
DRSG OPSITE POSTOP 4X8 (GAUZE/BANDAGES/DRESSINGS) IMPLANT
DRSG TEGADERM 2-3/8X2-3/4 SM (GAUZE/BANDAGES/DRESSINGS) ×5 IMPLANT
DRSG TEGADERM 4X4.75 (GAUZE/BANDAGES/DRESSINGS) IMPLANT
ELECT PENCIL ROCKER SW 15FT (MISCELLANEOUS) ×1 IMPLANT
ELECT REM PT RETURN 15FT ADLT (MISCELLANEOUS) ×1 IMPLANT
ENDOLOOP SUT PDS II 0 18 (SUTURE) IMPLANT
EVACUATOR SILICONE 100CC (DRAIN) IMPLANT
FORCEPS BPLR FENES DVNC XI (FORCEP) IMPLANT
GAUZE SPONGE 2X2 8PLY STRL LF (GAUZE/BANDAGES/DRESSINGS) ×1 IMPLANT
GLOVE ECLIPSE 8.0 STRL XLNG CF (GLOVE) ×3 IMPLANT
GLOVE INDICATOR 8.0 STRL GRN (GLOVE) ×3 IMPLANT
GOWN SRG XL LVL 4 BRTHBL STRL (GOWNS) ×1 IMPLANT
GOWN STRL NON-REIN XL LVL4 (GOWNS) ×1
GOWN STRL REUS W/ TWL XL LVL3 (GOWN DISPOSABLE) ×4 IMPLANT
GOWN STRL REUS W/TWL XL LVL3 (GOWN DISPOSABLE) ×4
GRASPER SUT TROCAR 14GX15 (MISCELLANEOUS) IMPLANT
GRASPER TIP-UP FEN DVNC XI (INSTRUMENTS) ×1 IMPLANT
HOLDER FOLEY CATH W/STRAP (MISCELLANEOUS) ×1 IMPLANT
IRRIG SUCT STRYKERFLOW 2 WTIP (MISCELLANEOUS) ×1
IRRIGATION SUCT STRKRFLW 2 WTP (MISCELLANEOUS) ×1 IMPLANT
KIT PROCEDURE DVNC SI (MISCELLANEOUS) IMPLANT
KIT SIGMOIDOSCOPE (SET/KITS/TRAYS/PACK) IMPLANT
KIT TURNOVER KIT A (KITS) IMPLANT
NDL INSUFFLATION 14GA 120MM (NEEDLE) ×1 IMPLANT
NEEDLE INSUFFLATION 14GA 120MM (NEEDLE) ×1 IMPLANT
PACK CARDIOVASCULAR III (CUSTOM PROCEDURE TRAY) ×1 IMPLANT
PACK COLON (CUSTOM PROCEDURE TRAY) ×1 IMPLANT
PAD POSITIONING PINK XL (MISCELLANEOUS) ×1 IMPLANT
PROTECTOR NERVE ULNAR (MISCELLANEOUS) ×2 IMPLANT
RELOAD STAPLE 45 3.5 BLU DVNC (STAPLE) IMPLANT
RELOAD STAPLE 45 4.3 GRN DVNC (STAPLE) IMPLANT
RELOAD STAPLE 60 2.5 WHT DVNC (STAPLE) IMPLANT
RELOAD STAPLE 60 3.5 BLU DVNC (STAPLE) IMPLANT
RELOAD STAPLE 60 4.3 GRN DVNC (STAPLE) IMPLANT
RELOAD STAPLER 2.5X60 WHT DVNC (STAPLE) ×1 IMPLANT
RELOAD STAPLER 3.5X45 BLU DVNC (STAPLE) IMPLANT
RELOAD STAPLER 3.5X60 BLU DVNC (STAPLE) ×2 IMPLANT
RELOAD STAPLER 4.3X45 GRN DVNC (STAPLE) IMPLANT
RELOAD STAPLER 4.3X60 GRN DVNC (STAPLE) IMPLANT
RETRACTOR WND ALEXIS 18 MED (MISCELLANEOUS) IMPLANT
RTRCTR WOUND ALEXIS 18CM MED (MISCELLANEOUS) ×1
SCISSORS LAP 5X35 DISP (ENDOMECHANICALS) ×1 IMPLANT
SCISSORS MNPLR CVD DVNC XI (INSTRUMENTS) ×1 IMPLANT
SEAL UNIV 5-12 XI (MISCELLANEOUS) ×4 IMPLANT
SEALER VESSEL EXT DVNC XI (MISCELLANEOUS) ×1 IMPLANT
SOL ELECTROSURG ANTI STICK (MISCELLANEOUS) ×1
SOLUTION ELECTROSURG ANTI STCK (MISCELLANEOUS) ×1 IMPLANT
SPIKE FLUID TRANSFER (MISCELLANEOUS) ×1 IMPLANT
STAPLER 45 SUREFORM DVNC (STAPLE) IMPLANT
STAPLER 60 SUREFORM DVNC (STAPLE) IMPLANT
STAPLER ECHELON POWER CIR 29 (STAPLE) IMPLANT
STAPLER ECHELON POWER CIR 31 (STAPLE) IMPLANT
STAPLER RELOAD 2.5X60 WHT DVNC (STAPLE) ×1
STAPLER RELOAD 3.5X45 BLU DVNC (STAPLE)
STAPLER RELOAD 3.5X60 BLU DVNC (STAPLE) ×2
STAPLER RELOAD 4.3X45 GRN DVNC (STAPLE)
STAPLER RELOAD 4.3X60 GRN DVNC (STAPLE)
STOPCOCK 4 WAY LG BORE MALE ST (IV SETS) ×2 IMPLANT
SURGILUBE 2OZ TUBE FLIPTOP (MISCELLANEOUS) IMPLANT
SUT MNCRL AB 4-0 PS2 18 (SUTURE) ×1 IMPLANT
SUT PDS AB 1 CT1 27 (SUTURE) ×2 IMPLANT
SUT PROLENE 0 CT 2 (SUTURE) IMPLANT
SUT PROLENE 2 0 KS (SUTURE) IMPLANT
SUT PROLENE 2 0 SH DA (SUTURE) IMPLANT
SUT SILK 2 0 (SUTURE)
SUT SILK 2 0 SH CR/8 (SUTURE) IMPLANT
SUT SILK 2-0 18XBRD TIE 12 (SUTURE) IMPLANT
SUT SILK 3 0 (SUTURE)
SUT SILK 3 0 SH CR/8 (SUTURE) ×1 IMPLANT
SUT SILK 3-0 18XBRD TIE 12 (SUTURE) IMPLANT
SUT V-LOC BARB 180 2/0GR6 GS22 (SUTURE)
SUT VIC AB 3-0 SH 18 (SUTURE) IMPLANT
SUT VIC AB 3-0 SH 27 (SUTURE)
SUT VIC AB 3-0 SH 27XBRD (SUTURE) IMPLANT
SUT VICRYL 0 UR6 27IN ABS (SUTURE) ×1 IMPLANT
SUTURE V-LC BRB 180 2/0GR6GS22 (SUTURE) IMPLANT
SYR 20ML ECCENTRIC (SYRINGE) ×1 IMPLANT
SYS LAPSCP GELPORT 120MM (MISCELLANEOUS)
SYS WOUND ALEXIS 18CM MED (MISCELLANEOUS)
SYSTEM LAPSCP GELPORT 120MM (MISCELLANEOUS) IMPLANT
SYSTEM WOUND ALEXIS 18CM MED (MISCELLANEOUS) ×1 IMPLANT
TOWEL OR NON WOVEN STRL DISP B (DISPOSABLE) ×1 IMPLANT
TRAY FOLEY MTR SLVR 16FR STAT (SET/KITS/TRAYS/PACK) ×1 IMPLANT
TROCAR ADV FIXATION 5X100MM (TROCAR) ×1 IMPLANT
TUBING CONNECTING 10 (TUBING) ×2 IMPLANT
TUBING INSUFFLATION 10FT LAP (TUBING) ×1 IMPLANT

## 2023-01-05 NOTE — Op Note (Signed)
01/05/2023  1:21 PM  PATIENT:  [name] [name]  72 y.o. male  Patient Care Team: Ardith Dark, MD as PCP - General (Family Medicine) Swazil[place], Peter M, MD as PCP - Cardiology (Cardiology) Hanley Seamen Dustin Folks, MD as Consulting Physician (Optometry) August Saucer, Corrie Mckusick, MD as Consulting Physician (Orthopedic Surgery) [facility] as Consulting Physician Erroll Luna, [facility] (Inactive) (Pharmacist) Karie Soda, MD as Consulting Physician (General Surgery) Mansouraty, Netty Starring., MD as Consulting Physician (Gastroenterology) [place]azil[place], Peter M, MD as Consulting Physician (Cardiology)  PRE-OPERATIVE DIAGNOSIS:   RECURRENT ASCENDING COLON POLYP UMBILICAL HERNIA WITH PAIN 1X1CM  POST-OPERATIVE DIAGNOSIS:   RECURRENT ASCENDING COLON POLYP UMBILICAL HERNIA WITH PAIN 1X1CM  PROCEDURE:   ROBOTIC PROXIMAL "RIGHT" COLECTOMY PRIMARY UMBILICAL HERNIA REPAIR TRANSVERSUS ABDOMINIS PLANE (TAP) BLOCK - BILATERAL  SURGEON:  Ardeth Sportsman, MD  ASSISTANT:  Romie Levee, MD  An experienced assistant was required given the standard of surgical care given the complexity of the case.  This assistant was needed for exposure, dissection, suction, tissue approximation, retraction, perception, etc  ANESTHESIA:  General endotracheal intubation anesthesia (GETA) and Regional TRANSVERSUS ABDOMINIS PLANE (TAP) nerve block -BILATERAL for perioperative & postoperative pain control at the level of the transverse abdominis & preperitoneal spaces along the flank at the anterior axillary line, from subcostal ridge to iliac crest under laparoscopic guidance provided with liposomal bupivacaine (Experel) 20mL mixed with 50 mL of bupivicaine 0.25% with epinephrine  Estimated Blood Loss (EBL):   Total I/O In: 1950 [I.V.:1850; IV Piggyback:100] Out: 220 [Urine:200; Blood:20].   (See anesthesia record)  Delay start of Pharmacological VTE agent (>24hrs) due to concerns of significant anemia,  surgical blood loss, or risk of bleeding?:  no  DRAINS: (None)  SPECIMEN:  Colon - proximal right  DISPOSITION OF SPECIMEN:  Pathology  COUNTS:  Sponge, needle, & instrument counts CORRECT  PLAN OF CARE: Admit to inpatient   PATIENT DISPOSITION:  PACU - hemodynamically stable.  INDICATION:    Pleasant male with history of colon polyps.  Found to have large polyp in ascending colon.  Recurrent persistent despite attempted polypectomies and endoscopic resections.  Surgical consultation requested.  I recommended segmental resection:  The anatomy & physiology of the digestive tract was discussed.  The pathophysiology was discussed.  Natural history risks without surgery was discussed.   I worked to give an overview of the disease and the frequent need to have multispecialty involvement.  I feel the risks of no intervention will lead to serious problems that outweigh the operative risks; therefore, I recommended a partial colectomy to remove the pathology.  Laparoscopic & open techniques were discussed.   Risks such as bleeding, infection, abscess, leak, reoperation, possible ostomy, hernia, heart attack, death, and other risks were discussed.  I noted a good likelihood this will help address the problem.   Goals of post-operative recovery were discussed as well.  We will work to minimize complications.  An educational handout on the pathology was given as well.  Questions were answered.  Patient was to have umbilical hernia repair done of his chronic umbilical hernia since it and gotten larger, is out of the time, and is sensitive.  The patient expresses understanding & wishes to proceed with surgery.  OR FINDINGS:   Patient had chronic tattooing in the posterior ascending colon.  In the proximal ascending colon mesenteric side was a firm fibrotic mass consistent with the base of the recurrent polyp.  No obvious metastatic disease on visceral parietal peritoneum  or liver.  It is an   isoperistaltic ileocolonic anastomosis that rests in the periumbilical region.  1 x 1 cm umbilical hernia with some sensitivity but reducible.  Primarily repaired.  CASE DATA:  Type of patient?: Elective [facility] Private Case  Status of Case? Elective Scheduled  Infection Present At Time Of Surgery (PATOS)?  NO    DESCRIPTION:   Informed consent was confirmed.  The patient underwent general anaesthesia without difficulty.  The patient was positioned with arms tucked & secured appropriately.  VTE prevention in place.  The patient's abdomen was clipped, prepped, & draped in a sterile fashion.  Surgical timeout confirmed our plan.  The patient was positioned in reverse Trendelenburg.  Abdominal entry was gained using Varess technique at the left subcostal ridge on the anterior abdominal wall.  No elevated EtCO2 noted.  Port placed.  Camera inspection revealed no injury.  Extra ports were carefully placed under direct laparoscopic visualization.  We docked the [employer] Vinci robot carefully and placed intstruments under visualization.  Did laparoscopic exploration and confirmed tattooing along the right paracolic gutter correlating with the ascending colon prior tattooing and resections.  No obvious metastatic disease.  No tattooing or other concerns elsewhere.  I mobilized & reflected the greater omentum in the upper abdomen.  Position the small bowel into the left lower quadrant and pelvis.   I was able to elevate the proximal colon to isolate the ileocolonic pedicle.  I scored the ileal mesentery just proximal to that.   I carried that further dissection in a medial to lateral fashion.  I was able to bluntly get into the retro-mesenteric plane on the right side.  I freed the proximal right sided colonic mesentery off the retroperitoneum including the duodenal sweep, pancreatic head, & Gerota's fascia of the right kidney. I was able to get underneath the hepatic flexure.  I was able to get underneath  the proximal and mid transverse colon.  I isolated the proximal ileocecal pedicle.  I skeletonized it & transected the vessels.    I then proceeded to mobilize the terminal ileum & proximal "right" colon in a lateral to medial fashion.  I mobilized the distal ileal mesentery off its retroperitoneal and pelvic attachments.  I mobilized the ascending colon off It is side wall attachments to the paracolic gutter and retroperitoneum.  There was some scarring and thickness along the retroperitoneum and especially Gerota's fascia consistent with prior endoscopic resections.  However no concerning tumor deposits nodularity or carcinomatosis.  I also mobilized the greater omentum off the mid transverse colon and mobilized the mid to proximal transverse colon in a superior to inferior fashion.  This allowed me to mobilize the hepatic flexure and get a complete mobilization of the proximal "right" colon to the mid-transverse colon.   I could isolate the pathology.   Chose an area in the distal ileum and transected the mesentery radially chose an area in the transverse colon just proximal to a dominant middle colic artery pedicle and transected that in a radial fashion to good location.  We confirmed good viability of the ileum and transverse colon plan for anastomosis. When he went ahead and proceeded with transection.  We transected at the distal ileum with a robotic stapler 60mm blue load.  We then transected transverse colon with a robotic stapler 60mm blue load.  We confirmed hemostasis.     I did a side-to-side stapled anastomosis of ileum to mid-transverse colon using a 60mm white load in an isoperistaltic  fashion.  (Distal stump of ileum to mid transverse colon for the distal end of the anastomosis.  Proximal end of colon stump to more proximal ileum for the proximal end of the anastomosis).  I sewed the common staple channel wound with an absorbable suture (V-lock) in a running [place] fashion from each corner  and meeting in the center.  I did meticulous inspection prove an airtight closure.  I protected the anastomosis line with an anterior omentopexy of greater omentum using V lock suture.    We did reinspection of the abdomen.  Hemostasis was good.   Ureters, retroperitoneum, and bowel uninjured.  The anastomosis looked healthy.   Endoluminal gas was evacuated.  We placed the wound protector through the suprapubic 12mm port site after it was enlarged in a Pfannenstiel fashion.  Specimen removed without incident.   Ports & wound protector removed.  Hemostasis was good.  Sterile unused instruments were used from this point.  I closed the skin at the port sites using Monocryl stitch and sterile dressing.  As he had a sensitive umbilical hernia and proceed with repair.  Made a curvilinear incision along the superior umbilical fold and freed off the umbilical stalk to confirm a 1 x 1 cm hernia.  Primarily closed transversely with #1 PDS interrupted sutures.  Excess skin was trimmed down and umbilicus 3 tacked down with a 0 Vicryl suture and closed the skin with Monocryl suture.  I closed the extraction wound using a 0 Vicryl vertical peritoneal closure and a #1 PDS transverse anterior rectal fascial closure like a small Pfannenstiel closure. I closed the skin with some interrupted Monocryl stitches.  I placed sterile dressings.     Patient is being extubated go to recovery room. I discussed postop care with the patient in detail the office & in the holding area. Instructions are written. I discussed operative findings, updated the patient's status, discussed probable steps to recovery, and gave postoperative recommendations to the patient's brother, Azel Gumina .  Recommendations were made.  Questions were answered.  He expressed understanding & appreciation.   Ardeth Sportsman, M.D., F.A.C.S. Gastrointestinal and Minimally Invasive Surgery [facility] [address] [place], [place] [address] [phone] Main / Paging

## 2023-01-05 NOTE — Anesthesia Procedure Notes (Signed)
Procedure Name: Intubation Date/Time: 01/05/2023 11:01 AM  Performed by: Oletha Cruel, CRNAPre-anesthesia Checklist: Patient identified, Emergency Drugs available, Suction available, Patient being monitored and Timeout performed Oxygen Delivery Method: Circle system utilized Preoxygenation: Pre-oxygenation with 100% oxygen Induction Type: IV induction Ventilation: Mask ventilation without difficulty Laryngoscope Size: Mac and 4 Grade View: Grade II Tube type: Oral Tube size: 7.5 mm Number of attempts: 1 Airway Equipment and Method: Stylet Placement Confirmation: ETT inserted through vocal cords under direct vision, positive ETCO2 and breath sounds checked- equal and bilateral Secured at: 23 (23 cm at lower lip) cm Tube secured with: Tape Dental Injury: Teeth and Oropharynx as per pre-operative assessment

## 2023-01-05 NOTE — Transfer of Care (Addendum)
Immediate Anesthesia Transfer of Care Note  Patient: [name] [name]  Procedure(s) Performed: ROBOTIC COLECTOMY PROXIMAL, BILATERAL TAP BLOCK, PRIMARY UMBICIAL HERNIA REPAIR  Patient Location: PACU  Anesthesia Type:General  Level of Consciousness: drowsy and patient cooperative  Airway & Oxygen Therapy: Patient Spontanous Breathing and Patient connected to face mask oxygen  Post-op Assessment: Report given to RN and Post -op Vital signs reviewed and stable  Post vital signs: Reviewed and stable  Last Vitals:  Vitals Value Taken Time  BP 171/125 01/05/23 1311  Temp 37 01/05/23   1315  Pulse 63 01/05/23 1313  Resp 14 01/05/23 1313  SpO2 100 % 01/05/23 1313  Vitals shown include unfiled device data.  Last Pain:  Vitals:   01/05/23 0847  TempSrc:   PainSc: 0-No pain         Complications: No notable events documented.

## 2023-01-05 NOTE — Discharge Instructions (Addendum)
SURGERY: POST OP INSTRUCTIONS (Surgery for small bowel obstruction, colon resection, etc)   ######################################################################  EAT Gradually transition to a high fiber diet with a fiber supplement over the next few days after discharge  WALK Walk an hour a day.  Control your pain to do that.    CONTROL PAIN Control pain so that you can walk, sleep, tolerate sneezing/coughing, go up/down stairs.  HAVE A BOWEL MOVEMENT DAILY Keep your bowels regular to avoid problems.  OK to try a laxative to override constipation.  OK to use an antidairrheal to slow down diarrhea.  Call if not better after 2 tries  CALL IF YOU HAVE PROBLEMS/CONCERNS Call if you are still struggling despite following these instructions. Call if you have concerns not answered by these instructions  ######################################################################   DIET Follow a light diet the first few days at home.  Start with a bland diet such as soups, liquids, starchy foods, low fat foods, etc.  If you feel full, bloated, or constipated, stay on a ful liquid or pureed/blenderized diet for a few days until you feel better and no longer constipated. Be sure to drink plenty of fluids every day to avoid getting dehydrated (feeling dizzy, not urinating, etc.). Gradually add a fiber supplement to your diet over the next week.  Gradually get back to a regular solid diet.  Avoid fast food or heavy meals the first week as you are more likely to get nauseated. It is expected for your digestive tract to need a few months to get back to normal.  It is common for your bowel movements and stools to be irregular.  You will have occasional bloating and cramping that should eventually fade away.  Until you are eating solid food normally, off all pain medications, and back to regular activities; your bowels will not be normal. Focus on eating a low-fat, high fiber diet the rest of your life  (See Getting to Good Bowel Health, below).  CARE of your INCISION or WOUND  It is good for closed incisions and even open wounds to be washed every day.  Shower every day.  Short baths are fine.  Wash the incisions and wounds clean with soap & water.    You may leave closed incisions open to air if it is dry.   You may cover the incision with clean gauze & replace it after your daily shower for comfort.  TEGADERM:  You have clear gauze band-aid dressings over your closed incision(s).  Remove the dressings 3 days after surgery.= 7/14 Sunday    If you have an open wound with a wound vac, see wound vac care instructions.    ACTIVITIES as tolerated Start light daily activities --- self-care, walking, climbing stairs-- beginning the day after surgery.  Gradually increase activities as tolerated.  Control your pain to be active.  Stop when you are tired.  Ideally, walk several times a day, eventually an hour a day.   Most people are back to most day-to-day activities in a few weeks.  It takes 4-8 weeks to get back to unrestricted, intense activity. If you can walk 30 minutes without difficulty, it is safe to try more intense activity such as jogging, treadmill, bicycling, low-impact aerobics, swimming, etc. Save the most intensive and strenuous activity for last (Usually 4-8 weeks after surgery) such as sit-ups, heavy lifting, contact sports, etc.  Refrain from any intense heavy lifting or straining until you are off narcotics for pain control.  You will have off  days, but things should improve week-by-week. DO NOT PUSH THROUGH PAIN.  Let pain be your guide: If it hurts to do something, don't do it.  Pain is your body warning you to avoid that activity for another week until the pain goes down. You may drive when you are no longer taking narcotic prescription pain medication, you can comfortably wear a seatbelt, and you can safely make sudden turns/stops to protect yourself without hesitating due to  pain. You may have sexual intercourse when it is comfortable. If it hurts to do something, stop.   MEDICATIONS Take your usually prescribed home medications unless otherwise directed.   Blood thinners:  You can restart any strong blood thinners after the second postoperative day.  It is OK to continue aspirin before & after surgery..    Some blood in BMs the first 1-2 weeks is common but should taper down & be small volume.  If you are passing many large clots, call your surgeon    PAIN CONTROL Pain after surgery or related to activity is often due to strain/injury to muscle, tendon, nerves and/or incisions.  This pain is usually short-term and will improve in a few months.  To help speed the process of healing and to get back to regular activity more quickly, DO THE FOLLOWING THINGS TOGETHER: Increase activity gradually.  DO NOT PUSH THROUGH PAIN Use Ice and/or Heat Try Gentle Massage and/or Stretching Take over the counter pain medication Take Narcotic prescription pain medication for more severe pain  Good pain control = faster recovery.  It is better to take more medicine to be more active than to stay in bed all day to avoid medications.  Increase activity gradually Avoid heavy lifting at first, then increase to lifting as tolerated over the next 6 weeks. Do not "push through" the pain.  Listen to your body and avoid positions and maneuvers than reproduce the pain.  Wait a few days before trying something more intense Walking an hour a day is encouraged to help your body recover faster and more safely.  Start slowly and stop when getting sore.  If you can walk 30 minutes without stopping or pain, you can try more intense activity (running, jogging, aerobics, cycling, swimming, treadmill, sex, sports, weightlifting, etc.) Remember: If it hurts to do it, then don't do it! Use Ice and/or Heat You will have swelling and bruising around the incisions.  This will take several weeks to  resolve. Ice packs or heating pads (6-8 times a day, 30-60 minutes at a time) will help sooth soreness & bruising. Some people prefer to use ice alone, heat alone, or alternate between ice & heat.  Experiment and see what works best for you.  Consider trying ice for the first few days to help decrease swelling and bruising; then, switch to heat to help relax sore spots and speed recovery. Shower every day.  Short baths are fine.  It feels good!  Keep the incisions and wounds clean with soap & water.   Try Gentle Massage and/or Stretching Massage at the area of pain many times a day Stop if you feel pain - do not overdo it Take over the counter pain medication This helps the muscle and nerve tissues become less irritable and calm down faster Choose ONE of the following over-the-counter anti-inflammatory medications: Acetaminophen 500mg  tabs (Tylenol) 1-2 pills with every meal and just before bedtime (avoid if you have liver problems or if you have acetaminophen in you narcotic prescription) Naproxen  220mg  tabs (ex. Aleve, Naprosyn) 1-2 pills twice a day (avoid if you have kidney, stomach, IBD, or bleeding problems) Ibuprofen 200mg  tabs (ex. Advil, Motrin) 3-4 pills with every meal and just before bedtime (avoid if you have kidney, stomach, IBD, or bleeding problems) Take with food/snack several times a day as directed for at least 2 weeks to help keep pain / soreness down & more manageable. Take Narcotic prescription pain medication for more severe pain A prescription for strong pain control is often given to you upon discharge (for example: oxycodone/Percocet, hydrocodone/Norco/Vicodin, or tramadol/Ultram) Take your pain medication as prescribed. Be mindful that most narcotic prescriptions contain Tylenol (acetaminophen) as well - avoid taking too much Tylenol. If you are having problems/concerns with the prescription medicine (does not control pain, nausea, vomiting, rash, itching, etc.), please  call us [phone] to see if we need to switch you to a different pain medicine that will work better for you and/or control your side effects better. If you need a refill on your pain medication, you must call the office before 4 pm and on weekdays only.  By federal law, prescriptions for narcotics cannot be called into a pharmacy.  They must be filled out on paper & picked up from our office by the patient or authorized caretaker.  Prescriptions cannot be filled after 4 pm nor on weekends.    WHEN TO CALL us [phone] Severe uncontrolled or worsening pain  Fever over 101 F (38.5 C) Concerns with the incision: Worsening pain, redness, rash/hives, swelling, bleeding, or drainage Reactions / problems with new medications (itching, rash, hives, nausea, etc.) Nausea and/or vomiting Difficulty urinating Difficulty breathing Worsening fatigue, dizziness, lightheadedness, blurred vision Other concerns If you are not getting better after two weeks or are noticing you are getting worse, contact our office [phone] for further advice.  We may need to adjust your medications, re-evaluate you in the office, send you to the emergency room, or see what other things we can do to help. The clinic staff is available to answer your questions during regular business hours (8:30am-5pm).  Please don't hesitate to call and ask to speak to one of our nurses for clinical concerns.    A surgeon from [facility] is always on call at the hospitals 24 hours/day If you have a medical emergency, go to the nearest emergency room or call 911.  FOLLOW UP in our office One the day of your discharge from the hospital (or the next business weekday), please call [facility] to set up or confirm an appointment to see your surgeon in the office for a follow-up appointment.  Usually it is 2-3 weeks after your surgery.   If you have skin staples at your incision(s), let the office know so we can  set up a time in the office for the nurse to remove them (usually around 10 days after surgery). Make sure that you call for appointments the day of discharge (or the next business weekday) from the hospital to ensure a convenient appointment time. IF YOU HAVE DISABILITY OR FAMILY LEAVE FORMS, BRING THEM TO THE OFFICE FOR PROCESSING.  DO NOT GIVE THEM TO YOUR DOCTOR.  [facility] [address], [place], [place]  [address] ? [phone] - Main [phone] - Toll Free,  [phone] - Fax [email or web address]    GETTING TO GOOD BOWEL HEALTH. It is expected for your digestive tract to need a few months to get  back to normal.  It is common for your bowel movements and stools to be irregular.  You will have occasional bloating and cramping that should eventually fade away.  Until you are eating solid food normally, off all pain medications, and back to regular activities; your bowels will not be normal.   Avoiding constipation The goal: ONE SOFT BOWEL MOVEMENT A DAY!    Drink plenty of fluids.  Choose water first. TAKE A FIBER SUPPLEMENT EVERY DAY THE REST OF YOUR LIFE During your first week back home, gradually add back a fiber supplement every day Experiment which form you can tolerate.   There are many forms such as powders, tablets, wafers, gummies, etc Psyllium bran (Metamucil), methylcellulose (Citrucel), Miralax or Glycolax, Benefiber, Flax Seed.  Adjust the dose week-by-week (1/2 dose/day to 6 doses a day) until you are moving your bowels 1-2 times a day.  Cut back the dose or try a different fiber product if it is giving you problems such as diarrhea or bloating. Sometimes a laxative is needed to help jump-start bowels if constipated until the fiber supplement can help regulate your bowels.  If you are tolerating eating & you are farting, it is okay to try a gentle laxative such as double dose MiraLax, prune juice, or Milk of Magnesia.   Avoid using laxatives too often. Stool softeners can sometimes help counteract the constipating effects of narcotic pain medicines.  It can also cause diarrhea, so avoid using for too long. If you are still constipated despite taking fiber daily, eating solids, and a few doses of laxatives, call our office.  Have a bowel movement every day  Until you have a bowel movement after surgery, you will struggle with urinating and controlling her pain.  Take a fiber supplement (such as Metamucil, Citrucel, FiberCon, MiraLax, etc) 2-4 doses a day to help prevent constipation from occurring.     If you have not had a bowel movement by the second postoperative day:  -switch to drinking liquids only -take MiraLAX double dose every 2 hours x 3 or until you have a BM -If that does not work x 3 doses, increase to 4 doses every 2 hours (Like a small bowel prep) until you have a bowel movement. -Avoid enemas or suppositories (can harm sutures/repair) -Once you start having bowel movements, adjust your fiber supplement or Miralax dosing back down until you are having 1-2 soft well formed BMs a day.  (Start at 2 doses a day & adjust up or down to avoid diarrhea or recurrent constipation)   Watch out for diarrhea.  If you have many loose bowel movements, simplify your diet to bland foods & liquids for a few days.  Stop any stool softeners and decrease your fiber supplement.  Switching to mild anti-diarrheal medications (Kayopectate, Pepto Bismol) can help.  Can try an imodium/loperamide dose.  If this worsens or does not improve, please call us.   Controlling diarrhea Try drinking liquids and eating bland foods for a few days to avoid stressing your intestines further. Avoid dairy products (especially milk & ice cream) for a short time.  The intestines often can lose the ability to digest lactose when stressed. Avoid foods that cause gassiness or bloating.  Typical foods include beans and other legumes,  cabbage, broccoli, and dairy foods.  Avoid greasy, spicy, fast foods.  Every person has some sensitivity to other foods, so listen to your body and avoid those foods that trigger problems for you. Probiotics (such as  active yogurt, Align, etc) may help repopulate the intestines and colon with normal bacteria and calm down a sensitive digestive tract Adding a fiber supplement gradually can help thicken stools by absorbing excess fluid and retrain the intestines to act more normally.  Slowly increase the dose over a few weeks.  Too much fiber too soon can backfire and cause cramping & bloating. It is okay to try and slow down diarrhea with a few doses of antidiarrheal medicines.   Bismuth subsalicylate (ex. Kayopectate, Pepto Bismol) for a few doses can help control diarrhea.  Avoid if pregnant.   Loperamide (Imodium) can slow down diarrhea.  Start with one tablet (2mg ) first.  Avoid if you are having fevers or severe pain.   TROUBLESHOOTING IRREGULAR BOWELS 1) Start with a soft & bland diet. No spicy, greasy, or fried foods.  2) Avoid gluten/wheat or dairy products from diet to see if symptoms improve. 3) Miralax 17gm or flax seed mixed in 8oz. water or juice-daily. May use 2-4 times a day as needed. 4) Gas-X, Phazyme, etc. as needed for gas & bloating.  5) Prilosec (omeprazole) over-the-counter as needed 6)  Consider probiotics (Align, Activa, etc) to help calm the bowels down  Call your doctor if you are getting worse or not getting better.  Sometimes further testing (cultures, endoscopy, X-ray studies, CT scans, bloodwork, etc.) may be needed to help diagnose and treat the cause of the diarrhea. [facility] [address], [address], [place], [place]  [address] [phone] - Main.    [phone]  - Toll Free.   [phone] - Fax [email or web address]

## 2023-01-05 NOTE — Anesthesia Postprocedure Evaluation (Signed)
Anesthesia Post Note  Patient: [name] [name]  Procedure(s) Performed: ROBOTIC COLECTOMY PROXIMAL, BILATERAL TAP BLOCK, PRIMARY UMBICIAL HERNIA REPAIR     Patient location during evaluation: PACU Anesthesia Type: General Level of consciousness: awake and alert Pain management: pain level controlled Vital Signs Assessment: post-procedure vital signs reviewed and stable Respiratory status: spontaneous breathing, nonlabored ventilation, respiratory function stable and patient connected to nasal cannula oxygen Cardiovascular status: blood pressure returned to baseline and stable Postop Assessment: no apparent nausea or vomiting Anesthetic complications: no  No notable events documented.  Last Vitals:  Vitals:   01/05/23 1345 01/05/23 1517  BP: 116/60 122/65  Pulse: (!) 58 66  Resp: 12 16  Temp:  37 C  SpO2: 90%     Last Pain:  Vitals:   01/05/23 1531  TempSrc:   PainSc: 5                  Draven Laine L Lewie Deman

## 2023-01-05 NOTE — H&P (Signed)
01/05/2023    REFERRING PHYSICIAN: Leta Baptist, PA  Patient Care Team: Ardith Dark, MD as PCP - General (Family Medicine) Swaziland, Peter Manning, MD (Cardiovascular Disease) Mansouraty, Netty Starring., MD (Gastroenterology)  PROVIDER: Jarrett Soho, MD  [facility] MRN: [id number] DOB: 03-01-1951  SUBJECTIVE   Chief Complaint: New Consultation (adenoma polyp)   [name] [name] is a 72 y.o. male  who is seen today as an office consultation  at the request of DrMeridee Score for evaluation of persistent/recurrent polyp.  History of Present Illness:  Pleasant active gentleman. History of coronary disease status post stenting 2015 - on Plavix. Followed by Dr. Swaziland. He has had colonoscopies done. Evidence of polyps. Noted to have a large carpeting polyp in the ascending colon involving 50% of the circumference by Dr. Leone Payor in January 2023. Internal referral made. Dr. Meridee Score has been doing some EMR resections of the polyp. Did 1st in March 2023. Repeat scope in October 2023 with recurrence again resected. 17-month follow-up noted recurrence again. Attempt made to reresection but was concerned that he could not have complete resection. Biopsies consistent with adenomatous tissue without any definite high-grade polyps. Given persistence and recurrence in last episode not able to be safely resected, request made to consider segmental colonic resection.  She has some diabetes. Hemoglobin A1c usually around 7 but had an increase. Therefore his metformin was increased. Not on insulin. No sleep apnea. No abdominal surgeries. Does not smoke. Can walk at least half hour without difficulty. Tolerated knee surgery and endoscopies in the past few years without issue.  Medical History:  Past Medical History:  Diagnosis Date  Diabetes mellitus without complication (CMS/HHS-HCC)  Hypertension   Patient Active Problem List  Diagnosis  Adenomatous polyp of ascending colon  Chronic  anticoagulation  Umbilical hernia without obstruction and without gangrene  Diastasis recti   Past Surgical History:  Procedure Laterality Date  ARTHROSCOPIC ROTATOR CUFF REPAIR  2020  Coronary Stent  2015  REPLACEMENT TOTAL KNEE  2022    Allergies  Allergen Reactions  Amlodipine Besylate Cough  Asprin Ec Low Dose [Aspirin] Hives and Swelling  Oxycodone Nausea   Current Outpatient Medications on File Prior to Visit  Medication Sig Dispense Refill  atorvastatin (LIPITOR) 80 MG tablet TAKE 1 TABLET DAILY AT 6:00PM  clopidogreL (PLAVIX) 75 mg tablet Take 75 mg by mouth once daily  hydroCHLOROthiazide (HYDRODIURIL) 25 MG tablet Take 1 tablet by mouth once daily  irbesartan (AVAPRO) 300 MG tablet Take 1 tablet by mouth once daily  metFORMIN (GLUCOPHAGE-XR) 500 MG XR tablet TAKE 2 TABLETS DAILY AFTER SUPPER  pantoprazole (PROTONIX) 40 MG DR tablet Take 1 tablet by mouth 2 (two) times daily before meals  spironolactone (ALDACTONE) 25 MG tablet Take 1 tablet by mouth once daily   No current facility-administered medications on file prior to visit.   Family History  Problem Relation Age of Onset  Coronary Artery Disease (Blocked arteries around heart) Mother  High blood pressure (Hypertension) Father  Diabetes Father  Stroke Sister    Social History   Tobacco Use  Smoking Status Never  Smokeless Tobacco Never    Social History   Socioeconomic History  Marital status: Single  Tobacco Use  Smoking status: Never  Smokeless tobacco: Never  Vaping Use  Vaping status: Never Used  Substance and Sexual Activity  Alcohol use: Yes  Drug use: Never   Social Determinants of Health   Financial Resource Strain: Low Risk (03/24/2022)  Received [facility]  Overall Financial Resource Strain (CARDIA)  Difficulty of Paying Living Expenses: Not hard at all  Food Insecurity: No Food Insecurity (03/24/2022)  Received from [facility]  Hunger Vital Sign  Worried About Running  Out of Food in the Last Year: Never true  Ran Out of Food in the Last Year: Never true  Transportation Needs: No Transportation Needs (03/24/2022)  [facility] - Transportation  Lack of Transportation (Medical): No  Lack of Transportation (Non-Medical): No  Physical Activity: Sufficiently Active (03/24/2022)  Received from [facility]  Exercise Vital Sign  Days of Exercise per Week: 3 days  Minutes of Exercise per Session: 60 min  Stress: No Stress Concern Present (03/24/2022)  Received from [facility] - Occupational Stress Questionnaire  Feeling of Stress : Not at all  Social Connections: Socially Isolated (03/24/2022)  Received from [facility]  Social Connection and Isolation Panel [NHANES]  Frequency of Communication with Friends and Family: More than three times a week  Frequency of Social Gatherings with Friends and Family: More than three times a week  Attends Religious Services: Never  Database administrator or Organizations: No  Attends Banker Meetings: Never  Marital Status: Never married   ############################################################  Review of Systems: A complete review of systems (ROS) was obtained from the patient.  We have reviewed this information and discussed as appropriate with the patient.  See HPI as well for other pertinent ROS.  Constitutional: No fevers, chills, sweats. Weight stable Eyes: No vision changes, No discharge HENT: No sore throats, nasal drainage Lymph: No neck swelling, No bruising easily Pulmonary: No cough, productive sputum CV: No orthopnea, PND . No exertional chest/neck/shoulder/arm pain. Patient can walk 2-3 miles without difficulty.   GI: No personal nor family history of GI/colon cancer, inflammatory bowel disease, irritable bowel syndrome, allergy such as Celiac Sprue, dietary/dairy problems, colitis, ulcers nor gastritis. No recent sick  contacts/gastroenteritis. No travel outside the country. No changes in diet.  Renal: No UTIs, No hematuria Genital: No drainage, bleeding, masses Musculoskeletal: No severe joint pain. Good ROM major joints Skin: No sores or lesions Heme/Lymph: No easy bleeding. No swollen lymph nodes Neuro: No active seizures. No facial droop Psych: No hallucinations. No agitation  OBJECTIVE   Vitals:  11/14/22 0924  BP: 136/78  Pulse: 60  Temp: 36.7 C (98 F)  SpO2: 98%  Weight: 81.5 kg (179 lb 9.6 oz)  Height: 174 cm (5' 8.5")  PainSc: 0-No pain   Body mass index is 26.91 kg/m.  PHYSICAL EXAM:  Constitutional: Not cachectic. Hygeine adequate. Vitals signs as above.  Eyes: Wears glasses - vision corrected,Pupils reactive, normal extraocular movements. Sclera nonicteric. Mild right eye inward gaze. Neuro: CN II-XII intact. No major focal sensory defects. No major motor deficits. Lymph: No head/neck/groin lymphadenopathy Psych: No severe agitation. No severe anxiety. Judgment & insight Adequate, Oriented x4, HENT: Normocephalic, Mucus membranes moist. No thrush. Hearing: adequate Neck: Supple, No tracheal deviation. No obvious thyromegaly Chest: No pain to chest wall compression. Good respiratory excursion. No audible wheezing CV: Pulses intact. regular. No major extremity edema Ext: No obvious deformity or contracture. Edema: Not present. No cyanosis Skin: No major subcutaneous nodules. Warm and dry Musculoskeletal: Severe joint rigidity not present. No obvious clubbing. No digital petechiae. Mobility: no assist device moving easily without restrictions  Abdomen: Flat Soft. Nondistended. Nontender. Hernia: Present at: umbilicus, size 1x1cm. Diastasis recti: Large supraumbilical midline. No hepatomegaly. No splenomegaly.  Genital/Pelvic: Inguinal hernia: Not present. Inguinal lymph nodes: without lymphadenopathy nor hidradenitis.   Rectal:  (Deferred)    ###################################################################  Labs, Imaging and Diagnostic Testing:  Located in 'Care Everywhere' section of Epic EMR chart  PRIOR [facility] CLINIC NOTES:  Not applicable  SURGERY NOTES:  Not applicable  PATHOLOGY:  Located in 'Care Everywhere' section of Epic EMR chart  Assessment and Plan:  DIAGNOSES:  Diagnoses and all orders for this visit:  Adenomatous polyp of ascending colon  Chronic anticoagulation  Umbilical hernia without obstruction and without gangrene  Diastasis recti    ASSESSMENT/PLAN  Pleasant male with history of colon polyps with persistently recurrent ascending colon polyp.  I think he would benefit from segmental colonic resection. He seems like a good candidate for robotic proximal right colectomy with intracorporeal anastomosis. The anatomy & physiology of the digestive tract was discussed. The pathophysiology of the colon was discussed. Natural history risks without surgery was discussed. I feel the risks of no intervention will lead to serious problems that outweigh the operative risks; therefore, I recommended a partial colectomy to remove the pathology. Minimally invasive (Robotic/Laparoscopic) & open techniques were discussed.   Risks such as bleeding, infection, abscess, leak, reoperation, injury to other organs, need for repair of tissues / organs, possible ostomy, hernia, heart attack, stroke, death, and other risks were discussed. I noted a good likelihood this will help address the problem. Goals of post-operative recovery were discussed as well. Need for adequate nutrition, daily bowel regimen and healthy physical activity, to optimize recovery was noted as well. We will work to minimize complications. Educational materials were available as well. Questions were answered. The patient expresses understanding & wishes to proceed with surgery.  Umbilical hernia. May consider primary repair at the  time but most likely will ignore for now since he is asymptomatic.  He is chronically anticoagulant Plavix for coronary disease status post stenting. Pretty sure Dr. Swaziland will be okay with holding his Plavix 4-5 days preop per standard but will double check that he has no other concerns. Last seen by Dr. Swaziland in December 2023.   Ardeth Sportsman, MD, FACS, MASCRS Esophageal, Gastrointestinal & Colorectal Surgery Robotic and Minimally Invasive Surgery  [facility] [address] [place], [place] [address] [phone] Fax [phone] Main  CONTACT INFORMATION:  Weekday (9AM-5PM): Call [facility] main office at [phone]  Weeknight (5PM-9AM) or Weekend/Holiday: Check [email or web address] (password " [email or web address]") for General Surgery [facility] coverage  (Please, do not use SecureChat as it is not reliable communication to reach operating surgeons for immediate patient care given surgeries/outpatient duties/clinic/cross-coverage/off post-call which would lead to a delay in care.  Epic staff messaging available for outptient concerns, but may not be answered for 48 hours or more).     01/05/2023

## 2023-01-05 NOTE — Interval H&P Note (Signed)
History and Physical Interval Note:  01/05/2023 9:43 AM  [name] [name]  has presented today for surgery, with the diagnosis of COLON POLYP UNRESECTABLE BY COLONSCOPY.  The various methods of treatment have been discussed with the patient and family. After consideration of risks, benefits and other options for treatment, the patient has consented to  Procedure(s): ROBOTIC COLECTOMY PROXIMAL (N/A) as a surgical intervention.  The patient's history has been reviewed, patient examined, no change in status, stable for surgery.  I have reviewed the patient's chart and labs.  Questions were answered to the patient's satisfaction.    I have re-reviewed the the patient's records, history, medications, and allergies.  I have re-examined the patient.  I again discussed intraoperative plans and goals of post-operative recovery.  The patient agrees to proceed.  [name] [name]  Jan 06, 1951 [id number]  Patient Care Team: Ardith Dark, MD as PCP - General (Family Medicine) [place], Peter M, MD as PCP - Cardiology (Cardiology) Hanley Seamen Dustin Folks, MD as Consulting Physician (Optometry) August Saucer, Corrie Mckusick, MD as Consulting Physician (Orthopedic Surgery) [facility] 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   IDA (iron deficiency anemia) 08/31/2021   Abnormal colonoscopy 08/29/2021   Tubular adenoma of ascending colon 08/29/2021   Hx of adenomatous colonic polyps 08/29/2021   PUD (peptic ulcer disease) 08/10/2021   S/P total knee arthroplasty, 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   Nephrolithiasis 03/05/2020   Labral tear of long head of biceps tendon, left, sequela    Complete tear of left rotator cuff     Antiplatelet or antithrombotic long-term use 01/17/2017   Diverticulosis of colon (without mention of hemorrhage) 04/25/2011   Adenomatous colon polyp 03/17/2011   LOW BACK PAIN, CHRONIC 12/13/2007   Hyperlipidemia associated with type 2 diabetes mellitus (HCC) 06/08/2007   T2DM (type 2 diabetes mellitus) (HCC) 02/27/2007   Hypertension associated with diabetes (HCC) 02/27/2007   AMBLYOPIA 12/06/2006   GERD 12/06/2006    Past Medical History:  Diagnosis Date   Adenomatous colon polyp 09/07/2004   Amblyopia    Anemia    Arthritis    CAD (coronary artery disease)    cardiac CT with a 50% LAD lesion in 2007   Diverticulosis    DM2 (diabetes mellitus, type 2) (HCC)    Elevated LFTs    secondary to labetalol   Esophageal stricture 2003   External hemorrhoids 2012   lanced by Dr Zachery Dakins   Family history of anesthesia complication    "sister had PONV"   GERD (gastroesophageal reflux disease)    esophageal stricture, pmh   Hiatal hernia 01/04/2000   History of kidney stones 2022   HLD (hyperlipidemia)    HTN (hypertension)    Hypopotassemia     Past Surgical History:  Procedure Laterality Date   BIOPSY  06/05/2021   Procedure: BIOPSY;  Surgeon: Iva Boop, MD;  Location: [facility];  Service: Endoscopy;;   BIOPSY  09/02/2021   Procedure: BIOPSY;  Surgeon: Lemar Lofty., MD;  Location: [facility] ENDOSCOPY;  Service: Gastroenterology;;   COLONOSCOPY  06/27/2004   tics , ADENOMATOUS polyps. F/U due 2011   COLONOSCOPY  06/27/2010   Tics   COLONOSCOPY WITH PROPOFOL N/A 09/02/2021   Procedure: COLONOSCOPY WITH PROPOFOL;  Surgeon: Meridee Score,  Netty Starring., MD;  Location: [facility] ENDOSCOPY;  Service: Gastroenterology;  Laterality: N/A;   COLONOSCOPY WITH PROPOFOL N/A 04/14/2022   Procedure: COLONOSCOPY WITH PROPOFOL;  Surgeon: Meridee Score Netty Starring., MD;  Location: [facility];  Service: Gastroenterology;  Laterality: N/A;   COLONOSCOPY WITH PROPOFOL N/A 10/13/2022   Procedure:  COLONOSCOPY WITH PROPOFOL;  Surgeon: Meridee Score Netty Starring., MD;  Location: [facility];  Service: Gastroenterology;  Laterality: N/A;   CORONARY ANGIOPLASTY WITH STENT PLACEMENT  04/01/2014   "2"   ENDOSCOPIC MUCOSAL RESECTION  09/02/2021   Procedure: ENDOSCOPIC MUCOSAL RESECTION;  Surgeon: Meridee Score Netty Starring., MD;  Location: [facility] ENDOSCOPY;  Service: Gastroenterology;;   ENDOSCOPIC MUCOSAL RESECTION N/A 04/14/2022   Procedure: ENDOSCOPIC MUCOSAL RESECTION;  Surgeon: Lemar Lofty., MD;  Location: [facility];  Service: Gastroenterology;  Laterality: N/A;   ESOPHAGEAL BRUSHING  04/14/2022   Procedure: ESOPHAGEAL BRUSHING;  Surgeon: Lemar Lofty., MD;  Location: [facility];  Service: Gastroenterology;;   ESOPHAGEAL DILATION  06/27/2001   ESOPHAGOGASTRODUODENOSCOPY  06/27/2001   ERD, stricture dilated   ESOPHAGOGASTRODUODENOSCOPY (EGD) WITH PROPOFOL N/A 06/05/2021   Procedure: ESOPHAGOGASTRODUODENOSCOPY (EGD) WITH PROPOFOL;  Surgeon: Iva Boop, MD;  Location: [facility];  Service: Endoscopy;  Laterality: N/A;   ESOPHAGOGASTRODUODENOSCOPY (EGD) WITH PROPOFOL N/A 09/02/2021   Procedure: ESOPHAGOGASTRODUODENOSCOPY (EGD) WITH PROPOFOL;  Surgeon: Meridee Score Netty Starring., MD;  Location: [facility] ENDOSCOPY;  Service: Gastroenterology;  Laterality: N/A;   ESOPHAGOGASTRODUODENOSCOPY (EGD) WITH PROPOFOL N/A 04/14/2022   Procedure: ESOPHAGOGASTRODUODENOSCOPY (EGD) WITH PROPOFOL;  Surgeon: Meridee Score Netty Starring., MD;  Location: [facility];  Service: Gastroenterology;  Laterality: N/A;   EXCISIONAL HEMORRHOIDECTOMY  ~ 2008   FULL THICKNESS RESECTION (FTRD) N/A 10/13/2022   Procedure: FULL THICKNESS RESECTION (FTRD);  Surgeon: Lemar Lofty., MD;  Location: [facility] ENDOSCOPY;  Service: Gastroenterology;  Laterality: N/A;   HEMOSTASIS CLIP PLACEMENT  09/02/2021   Procedure: HEMOSTASIS CLIP PLACEMENT;  Surgeon: Lemar Lofty., MD;  Location[facility] ENDOSCOPY;  Service:  Gastroenterology;;   HEMOSTASIS CLIP PLACEMENT  10/13/2022   Procedure: HEMOSTASIS CLIP PLACEMENT;  Surgeon: Lemar Lofty., MD;  Location: [facility] ENDOSCOPY;  Service: Gastroenterology;;   HEMOSTASIS CONTROL  04/14/2022   Procedure: HEMOSTASIS CONTROL;  Surgeon: Lemar Lofty., MD;  Location: [facility] ENDOSCOPY;  Service: Gastroenterology;;   HEMOSTASIS CONTROL  10/13/2022   Procedure: HEMOSTASIS CONTROL;  Surgeon: Lemar Lofty., MD;  Location: [facility];  Service: Gastroenterology;;   HOT HEMOSTASIS N/A 09/02/2021   Procedure: HOT HEMOSTASIS (ARGON PLASMA COAGULATION/BICAP);  Surgeon: Lemar Lofty., MD;  Location: [facility] ENDOSCOPY;  Service: Gastroenterology;  Laterality: N/A;   HOT HEMOSTASIS N/A 04/14/2022   Procedure: HOT HEMOSTASIS (ARGON PLASMA COAGULATION/BICAP);  Surgeon: Lemar Lofty., MD;  Location: [facility] ENDOSCOPY;  Service: Gastroenterology;  Laterality: N/A;   LEFT HEART CATHETERIZATION WITH CORONARY ANGIOGRAM N/A 03/26/2014   Procedure: LEFT HEART CATHETERIZATION WITH CORONARY ANGIOGRAM;  Surgeon: Peter M Swaziland, MD;  Location: [facility] CATH LAB;  Service: Cardiovascular;  Laterality: N/A;   PERCUTANEOUS CORONARY STENT INTERVENTION (PCI-S) N/A 04/01/2014   Procedure: PERCUTANEOUS CORONARY STENT INTERVENTION (PCI-S);  Surgeon: Peter M [place], MD;  Location: [facility] C[facility] LAB;  Service: Cardiovascular;  Laterality: N/A;   POLYPECTOMY  10/13/2022   Procedure: POLYPECTOMY;  Surgeon: Meridee Score Netty Starring., MD;  Location: [facility] ENDOSCOPY;  Service: Gastroenterology;;   POSTERIOR LAMINECTOMY / DECOMPRESSION LUMBAR SPINE  12/25/2009   L4 , Dr Newell Coral   SCLEROTHERAPY  10/13/2022   Procedure: SCLEROTHERAPY;  Surgeon: Lemar Lofty., MD;  Location: [facility];  Service:  Gastroenterology;;   SHOULDER ARTHROSCOPY WITH ROTATOR CUFF REPAIR AND OPEN BICEPS TENODESIS Left 02/04/2019   Procedure: LEFT SHOULDER ARTHROSCOPY, BICEPS TENODESIS, MINI OPEN ROTATOR CUFF TEAR  REPAIR;  Surgeon: Cammy Copa, MD;  Location: [facility];  Service: Orthopedics;  Laterality: Left;   SUBMUCOSAL INJECTION  09/02/2021   Procedure: SUBMUCOSAL INJECTION;  Surgeon: Meridee Score Netty Starring., MD;  Location: [facility] ENDOSCOPY;  Service: Gastroenterology;;   SUBMUCOSAL LIFTING INJECTION  04/14/2022   Procedure: SUBMUCOSAL LIFTING INJECTION;  Surgeon: Lemar Lofty., MD;  Location: [facility];  Service: Gastroenterology;;   TOTAL KNEE ARTHROPLASTY Right 05/11/2021   Procedure: TOTAL KNEE ARTHROPLASTY;  Surgeon: Durene Romans, MD;  Location: [facility];  Service: Orthopedics;  Laterality: Right;    Social History   Socioeconomic History   Marital status: Single    Spouse name: Not on file   Number of children: 0   Years of education: 16   Highest education level: Bachelor's degree (e.g., BA, AB, BS)  Occupational History   Occupation: Retired   Tobacco Use   Smoking status: Never   Smokeless tobacco: Never  Vaping Use   Vaping status: Never Used  Substance and Sexual Activity   Alcohol use: Not Currently    Comment: social   Drug use: No   Sexual activity: Not Currently  Other Topics Concern   Not on file  Social History Narrative   Fun: Water quality scientist.    Social Determinants of Health   Financial Resource Strain: Low Risk  (12/11/2022)   Overall Financial Resource Strain (CARDIA)    Difficulty of Paying Living Expenses: Not hard at all  Food Insecurity: No Food Insecurity (12/11/2022)   Hunger Vital Sign    Worried About Running Out of Food in the Last Year: Never true    Ran Out of Food in the Last Year: Never true  Transportation Needs: No Transportation Needs (12/11/2022)   PRAPARE - Administrator, Civil Service (Medical): No    Lack of Transportation (Non-Medical): No  Physical Activity: Sufficiently Active (12/11/2022)   Exercise Vital Sign    Days of Exercise per Week: 4 days    Minutes of Exercise per Session: 60 min   Stress: No Stress Concern Present (12/11/2022)   [employer] of Occupational Health - Occupational Stress Questionnaire    Feeling of Stress : Not at all  Social Connections: Socially Isolated (12/11/2022)   Social Connection and Isolation Panel [NHANES]    Frequency of Communication with Friends and Family: More than three times a week    Frequency of Social Gatherings with Friends and Family: Twice a week    Attends Religious Services: Never    Database administrator or Organizations: No    Attends Banker Meetings: Never    Marital Status: Never married  Intimate Partner Violence: Not At Risk (03/24/2022)   Humiliation, Afraid, Rape, and Kick questionnaire    Fear of Current or Ex-Partner: No    Emotionally Abused: No    Physically Abused: No    Sexually Abused: No    Family History  Problem Relation Age of Onset   Heart attack Mother 71   Stroke Mother 70   Prostate cancer Father    Diabetes Father    Heart disease Father 107       CBAG    Heart disease Sister    Diabetes Brother    Heart attack Brother    Breast cancer Maternal Aunt  Heart attack Maternal Grandfather 61   Diabetes Paternal Grandmother    Stroke Paternal Grandfather 57   Stomach cancer Neg Hx    Rectal cancer Neg Hx    Esophageal cancer Neg Hx    Colon cancer Neg Hx    Inflammatory bowel disease Neg Hx    Liver disease Neg Hx    Pancreatic cancer Neg Hx     Medications Prior to Admission  Medication Sig Dispense Refill Last Dose   atorvastatin (LIPITOR) 80 MG tablet TAKE 1 TABLET DAILY AT     6:00PM 90 tablet 0 01/04/2023   carvedilol (COREG) 3.125 MG tablet TAKE 1 TABLET TWICE DAILY  WITH MEALS 180 tablet 0 01/05/2023 at 0630   clopidogrel (PLAVIX) 75 MG tablet TAKE 1 TABLET DAILY 90 tablet 1 01/01/2023   Coenzyme Q10 (COQ10) 100 MG CAPS Take 200 mg by mouth daily.   01/04/2023   ferrous sulfate 325 (65 FE) MG tablet Take 325 mg by mouth daily.   01/04/2023   FIBER PO Take 3  capsules by mouth daily.      hydrochlorothiazide (HYDRODIURIL) 25 MG tablet Take 1 tablet (25 mg total) by mouth daily. 90 tablet 3 01/04/2023   irbesartan (AVAPRO) 300 MG tablet TAKE 1 TABLET DAILY 90 tablet 0 01/04/2023   metFORMIN (GLUCOPHAGE-XR) 500 MG 24 hr tablet TAKE 2 TABLETS DAILY AFTER SUPPER 180 tablet 3 01/04/2023   Multiple Vitamins-Minerals (MULTIVITAMIN WITH MINERALS) tablet Take 1 tablet by mouth daily.   01/04/2023   pantoprazole (PROTONIX) 40 MG tablet TAKE 1 TABLET TWICE DAILY  BEFORE MEALS (Patient taking differently: Take 40 mg by mouth daily.) 180 tablet 1 01/04/2023   Propylene Glycol, PF, (SYSTANE COMPLETE PF) 0.6 % SOLN Place 1 drop into both eyes daily as needed (dry eyes).   01/04/2023   sildenafil (REVATIO) 20 MG tablet Take 1-5 tablets by mouth daily as needed. 100 tablet 0    spironolactone (ALDACTONE) 25 MG tablet TAKE 1 TABLET DAILY 90 tablet 0 01/04/2023   Undecylenic Ac-Zn Undecylenate (FUNGI-NAIL TOE & FOOT EX) Apply 1 application  topically daily as needed (fungus).      vitamin B-12 (CYANOCOBALAMIN) 1000 MCG tablet Take 1,000 mcg by mouth daily.   01/04/2023   Whey Protein POWD Take 2 Scoops by mouth daily.      Accu-Chek Softclix Lancets lancets 1 each by Other route 3 (three) times daily. 102 each 11    Blood Glucose Monitoring Suppl (ACCU-CHEK AVIVA PLUS) w/Device KIT Check glucose TID or as needed E11.9 1 kit 0    glucose blood test strip Use as instructed 100 each 12    glucose blood test strip Use as instructed 100 each 12    nitroGLYCERIN (NITROSTAT) 0.4 MG SL tablet Place 1 tablet (0.4 mg total) under the tongue every 5 (five) minutes as needed for chest pain. 100 tablet 1     Current Facility-Administered Medications  Medication Dose Route Frequency Provider Last Rate Last Admin   bupivacaine liposome (EXPAREL) 1.3 % injection 266 mg  20 mL Infiltration Once Karie Soda, MD       cefoTEtan (CEFOTAN) 2 g in sodium chloride 0.9 % 100 mL IVPB  2 g  Intravenous On Call to OR Karie Soda, MD       lactated ringers infusion   Intravenous Continuous Linton Rump, MD 10 mL/hr at 01/05/23 0934 Continued from Pre-op at 01/05/23 0934     Allergies  Allergen Reactions   Aspirin Hives, Swelling  and Other (See Comments)    Angioedema   Oxycodone Nausea Only   Contrast Media [Iodinated Contrast Media] Hives, Itching, Rash and Other (See Comments)    Developed hives despite pre-treatment   Norvasc [Amlodipine Besylate] Other (See Comments) and Cough    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   Comment 1 Notify RN    Comment 2 Document in Chart     Imaging / Studies: No results found.   Ardeth Sportsman, M.D., F.A.C.S. Gastrointestinal and Minimally Invasive Surgery [facility] [address], [address] [place], [place] [address] [phone] Main / Paging  01/05/2023 9:43 AM     Ardeth Sportsman

## 2023-01-06 LAB — GLUCOSE, CAPILLARY
Glucose-Capillary: 146 mg/dL — ABNORMAL HIGH (ref 70–99)
Glucose-Capillary: 211 mg/dL — ABNORMAL HIGH (ref 70–99)
Glucose-Capillary: 223 mg/dL — ABNORMAL HIGH (ref 70–99)
Glucose-Capillary: 158 mg/dL — ABNORMAL HIGH (ref 70–99)

## 2023-01-06 LAB — BASIC METABOLIC PANEL
Anion gap: 8 (ref 5–15)
BUN: 17 mg/dL (ref 8–23)
CO2: 26 mmol/L (ref 22–32)
Calcium: 8.5 mg/dL — ABNORMAL LOW (ref 8.9–10.3)
Chloride: 99 mmol/L (ref 98–111)
Creatinine, Ser: 1.05 mg/dL (ref 0.61–1.24)
GFR, Estimated: >60 mL/min (ref >60)
Glucose, Bld: 144 mg/dL — ABNORMAL HIGH (ref 70–99)
Potassium: 4.4 mmol/L (ref 3.5–5.1)
Sodium: 133 mmol/L — ABNORMAL LOW (ref 135–145)

## 2023-01-06 LAB — CBC
HCT: 38 % — ABNORMAL LOW (ref 39.0–52.0)
Hemoglobin: 13.2 g/dL (ref 13.0–17.0)
MCH: 33.3 pg (ref 26.0–34.0)
MCHC: 34.7 g/dL (ref 30.0–36.0)
MCV: 96 fL (ref 80.0–100.0)
Platelets: 186 10*3/uL (ref 150–400)
RBC: 3.96 MIL/uL — ABNORMAL LOW (ref 4.22–5.81)
RDW: 12.9 % (ref 11.5–15.5)
WBC: 11.4 10*3/uL — ABNORMAL HIGH (ref 4.0–10.5)
nRBC: 0 % (ref 0.0–0.2)

## 2023-01-06 LAB — MAGNESIUM: Magnesium: 1.5 mg/dL — ABNORMAL LOW (ref 1.7–2.4)

## 2023-01-06 MED ORDER — HYDROCHLOROTHIAZIDE 25 MG PO TABS
25.0000 mg | ORAL_TABLET | Freq: Every day | ORAL | Status: DC
  Administered 2023-01-06 – 2023-01-08 (×3): 25 mg via ORAL
  Filled 2023-01-06 (×3): qty 1

## 2023-01-06 MED ORDER — METFORMIN HCL ER 500 MG PO TB24
500.0000 mg | ORAL_TABLET | Freq: Every day | ORAL | Status: DC
  Administered 2023-01-06 – 2023-01-10 (×5): 500 mg via ORAL
  Filled 2023-01-06 (×5): qty 1

## 2023-01-06 MED ORDER — POLYVINYL ALCOHOL 1.4 % OP SOLN: 1.0000 [drp] | Freq: Every day | OPHTHALMIC | Status: DC | PRN

## 2023-01-06 MED ORDER — ADULT MULTIVITAMIN W/MINERALS CH
1.0000 | ORAL_TABLET | Freq: Every day | ORAL | Status: DC
  Administered 2023-01-06 – 2023-01-11 (×6): 1 via ORAL
  Filled 2023-01-06 (×6): qty 1

## 2023-01-06 NOTE — Progress Notes (Signed)
   01/06/23 1146  TOC Brief Assessment  Insurance and Status Reviewed  Patient has primary care physician Yes  Home environment has been reviewed home alone  Prior level of function: indepedent  Prior/Current Home Services No current home services  Social Determinants of Health Reivew SDOH reviewed no interventions necessary  Readmission risk has been reviewed Yes  Transition of care needs no transition of care needs at this time

## 2023-01-06 NOTE — Progress Notes (Signed)
01/06/2023  [name] [name] [id number] 06/18/1951  CARE TEAM: PCP: Ardith Dark, MD  Outpatient Care Team: Patient Care Team: Ardith Dark, MD as PCP - General (Family Medicine) [place], Peter M, MD as PCP - Cardiology (Cardiology) Hanley Seamen Dustin Folks, MD as Consulting Physician (Optometry) August Saucer, Corrie Mckusick, MD as Consulting Physician (Orthopedic Surgery) [facility] as Consulting Physician Erroll Luna, [facility] (Inactive) (Pharmacist) Karie Soda, MD as Consulting Physician (General Surgery) Mansouraty, Netty Starring., MD as Consulting Physician (Gastroenterology) [place], Peter M, MD as Consulting Physician (Cardiology)  Inpatient Treatment Team: Treatment Team:  Karie Soda, MD [place], [place], [place] Sabino Snipes, RN Norva Pavlov, [facility] [place], [place] M   Problem List:   Principal Problem:   Ascending colon mass - recurrent Active Problems:   T2DM (type 2 diabetes mellitus) (HCC)   Hyperlipidemia associated with type 2 diabetes mellitus (HCC)   Hypertension associated with diabetes (HCC)   GERD   Antiplatelet or antithrombotic long-term use   PUD (peptic ulcer disease)   IDA (iron deficiency anemia)   Neoplasm of ascending colon   01/05/2023  POST-OPERATIVE DIAGNOSIS:   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  In the proximal ascending colon mesenteric side was a firm fibrotic mass consistent with the base of the recurrent polyp.  No obvious metastatic disease on visceral parietal peritoneum or liver.   It is an  isoperistaltic ileocolonic anastomosis that rests in the periumbilical region.   1 x 1 cm umbilical hernia with some sensitivity but reducible.   Primarily repaired.    Assessment [facility] Stay = 1 days) 1 Day Post-Op    Recovering well so far    Plan:  ERAS protocol.  Advance diet.  Stop IV fluids with.  Backup.  Foley removed today.  Follow-up in pathology.  Diabetic control.  Sliding scale insulin.  Restart half dose metformin.  Follow hypertension.  Okay to go back on diuretics.  Hold irbesartan for now.  -VTE prophylaxis- SCDs, etc  -mobilize as tolerated to help recovery  -Disposition:  Disposition:  The patient is from: Home Anticipate discharge to:  Home Anticipated Date of Discharge is:  July 13,2024   Barriers to discharge:  Pending Clinical improvement (more likely than not)  Patient currently is NOT MEDICALLY STABLE for discharge from the hospital from a surgery standpoint.      I reviewed nursing notes, last 24 h vitals and pain scores, last 48 h intake and output, last 24 h labs and trends, and last 24 h imaging results.  I have reviewed this patient's available data, including medical history, events of note, test results, etc as part of my evaluation.   A significant portion of that time was spent in counseling. Care during the described time interval was provided by me.  This care required moderate level of medical decision making.  01/06/2023    Subjective: (Chief complaint)  Feeling pretty good.  Pain controlled  Passing gas.  Tolerating liquids.  Objective:  Vital signs:  Vitals:   01/05/23 2212 01/06/23 0158 01/06/23 0500 01/06/23 0517  BP: 111/67 112/69  110/67  Pulse: 65 (!) 59  (!) 56  Resp: 17 18  15   Temp: 98.3 F (36.8  C) 97.8 F (36.6 C)  97.6 F (36.4 C)  TempSrc:  Oral  Oral  SpO2: 98% 96%  96%  Weight:   81 kg   Height:        Last BM Date : 01/05/23  Intake/Output   Yesterday:  07/11 0701 - 07/12 0700 In: 2974.7 [P.O.:300; I.V.:2474.7; IV Piggyback:200] Out: 2070 [Urine:2050; Blood:20] This shift:  No intake/output data  recorded.  Bowel function:  Flatus: YES  BM:  No  Drain: (No drain)   Physical Exam:  General: Pt awake/alert in no acute distress Eyes: PERRL, normal EOM.  Sclera clear.  No icterus Neuro: CN II-XII intact w/o focal sensory/motor deficits. Lymph: No head/neck/groin lymphadenopathy Psych:  No delerium/psychosis/paranoia.  Oriented x 4 HENT: Normocephalic, Mucus membranes moist.  No thrush Neck: Supple, No tracheal deviation.  No obvious thyromegaly Chest: No pain to chest wall compression.  Good respiratory excursion.  No audible wheezing CV:  Pulses intact.  Regular rhythm.  No major extremity edema MS: Normal AROM mjr joints.  No obvious deformity  Abdomen: Soft.  Nondistended.  Nontender.  dressings clean dry and intact  no evidence of peritonitis.  No incarcerated hernias.  Ext:   No deformity.  No mjr edema.  No cyanosis Skin: No petechiae / purpurea.  No major sores.  Warm and dry    Results:   Cultures: No results found for this or any previous visit (from the past 720 hour(s)).  Labs: Results for orders placed or performed during the hospital encounter of 01/05/23 (from the past 48 hour(s))  Glucose, capillary     Status: Abnormal   Collection Time: 01/05/23  8:36 AM  Result Value Ref Range   Glucose-Capillary 182 (H) 70 - 99 mg/dL    Comment: Glucose reference range applies only to samples taken after fasting for at least 8 hours.   Comment 1 Notify RN    Comment 2 Document in Chart   Glucose, capillary     Status: Abnormal   Collection Time: 01/05/23  1:24 PM  Result Value Ref Range   Glucose-Capillary 186 (H) 70 - 99 mg/dL    Comment: Glucose reference range applies only to samples taken after fasting for at least 8 hours.  Glucose, capillary     Status: Abnormal   Collection Time: 01/05/23  4:32 PM  Result Value Ref Range   Glucose-Capillary 176 (H) 70 - 99 mg/dL    Comment: Glucose reference range applies only to samples taken after fasting for at  least 8 hours.  Glucose, capillary     Status: Abnormal   Collection Time: 01/05/23 10:19 PM  Result Value Ref Range   Glucose-Capillary 207 (H) 70 - 99 mg/dL    Comment: Glucose reference range applies only to samples taken after fasting for at least 8 hours.  Basic metabolic panel     Status: Abnormal   Collection Time: 01/06/23  4:10 AM  Result Value Ref Range   Sodium 133 (L) 135 - 145 mmol/L   Potassium 4.4 3.5 - 5.1 mmol/L    Comment: HEMOLYSIS AT THIS LEVEL MAY AFFECT RESULT   Chloride 99 98 - 111 mmol/L   CO2 26 22 - 32 mmol/L   Glucose, Bld 144 (H) 70 - 99 mg/dL    Comment: Glucose reference range applies only to samples taken after fasting for at least 8 hours.   BUN 17 8 - 23 mg/dL   Creatinine, Ser 1.19 0.61 - 1.24 mg/dL   Calcium  8.5 (L) 8.9 - 10.3 mg/dL   GFR, Estimated >40 >98 mL/min    Comment: (NOTE) Calculated using the CKD-EPI Creatinine Equation (2021)    Anion gap 8 5 - 15    Comment: Performed at [facility], [address] [place], [place] [address]  CBC     Status: Abnormal   Collection Time: 01/06/23  4:10 AM  Result Value Ref Range   WBC 11.4 (H) 4.0 - 10.5 K/uL   RBC 3.96 (L) 4.22 - 5.81 MIL/uL   Hemoglobin 13.2 13.0 - 17.0 g/dL   HCT 78.2 (L) 95.6 - 21.3 %   MCV 96.0 80.0 - 100.0 fL   MCH 33.3 26.0 - 34.0 pg   MCHC 34.7 30.0 - 36.0 g/dL   RDW 08.6 57.8 - 46.9 %   Platelets 186 150 - 400 K/uL   nRBC 0.0 0.0 - 0.2 %    Comment: Performed at [facility], [address], [place], [place] [address]  Magnesium     Status: Abnormal   Collection Time: 01/06/23  4:10 AM  Result Value Ref Range   Magnesium 1.5 (L) 1.7 - 2.4 mg/dL    Comment: Performed at [facility] [address], [place], [place] [address]  Glucose, capillary     Status: Abnormal   Collection Time: 01/06/23  7:49 AM  Result Value Ref Range   Glucose-Capillary 146 (H) 70 - 99 mg/dL    Comment: Glucose reference range  applies only to samples taken after fasting for at least 8 hours.    Imaging / Studies: No results found.  Medications / Allergies: per chart  Antibiotics: Anti-infectives (From admission, onward)    Start     Dose/Rate Route Frequency Ordered Stop   01/05/23 2300  cefoTEtan (CEFOTAN) 2 g in sodium chloride 0.9 % 100 mL IVPB        2 g 200 mL/hr over 30 Minutes Intravenous Every 12 hours 01/05/23 1410 01/05/23 2258   01/05/23 1400  neomycin (MYCIFRADIN) tablet 1,000 mg  Status:  Discontinued       Placed in "And" Linked Group   1,000 mg Oral 3 times per day 01/05/23 0841 01/05/23 0846   01/05/23 1400  metroNIDAZOLE (FLAGYL) tablet 1,000 mg  Status:  Discontinued       Placed in "And" Linked Group   1,000 mg Oral 3 times per day 01/05/23 0841 01/05/23 0846   01/05/23 0845  cefoTEtan (CEFOTAN) 2 g in sodium chloride 0.9 % 100 mL IVPB        2 g 200 mL/hr over 30 Minutes Intravenous On call to O.R. 01/05/23 0841 01/05/23 1124         Note: Portions of this report may have been transcribed using voice recognition software. Every effort was made to ensure accuracy; however, inadvertent computerized transcription errors may be present.   Any transcriptional errors that result from this process are unintentional.    Ardeth Sportsman, MD, FACS, MASCRS Esophageal, Gastrointestinal & Colorectal Surgery Robotic and Minimally Invasive Surgery  [facility] [address] [place], [place] [address] [phone] Fax [phone] Main  CONTACT INFORMATION:  Weekday (9AM-5PM): Call [facility] main office at [phone]  Weeknight (5PM-9AM) or Weekend/Holiday: Check [email or web address] (password " [email or web address]") for General Surgery [facility] coverage  (Please, do not use SecureChat as it is not reliable communication to reach operating surgeons for immediate patient care given surgeries/outpatient duties/clinic/cross-coverage/off  post-call which  would lead to a delay in care.  Epic staff messaging available for outptient concerns, but may not be answered for 48 hours or more).     01/06/2023  7:52 AM

## 2023-01-07 LAB — GLUCOSE, CAPILLARY
Glucose-Capillary: 134 mg/dL — ABNORMAL HIGH (ref 70–99)
Glucose-Capillary: 224 mg/dL — ABNORMAL HIGH (ref 70–99)
Glucose-Capillary: 221 mg/dL — ABNORMAL HIGH (ref 70–99)
Glucose-Capillary: 142 mg/dL — ABNORMAL HIGH (ref 70–99)

## 2023-01-07 MED ORDER — METHOCARBAMOL 500 MG PO TABS: 750.0000 mg | ORAL_TABLET | Freq: Four times a day (QID) | ORAL | Status: DC | PRN

## 2023-01-07 MED ORDER — OXYCODONE HCL 5 MG PO TABS
10.0000 mg | ORAL_TABLET | Freq: Four times a day (QID) | ORAL | Status: DC | PRN
  Administered 2023-01-07 – 2023-01-08 (×2): 10 mg via ORAL
  Filled 2023-01-07 (×2): qty 2

## 2023-01-07 NOTE — Plan of Care (Signed)
  Problem: Education: Goal: Understanding of discharge needs will improve Outcome: Progressing   Problem: Activity: Goal: Ability to tolerate increased activity will improve Outcome: Progressing   Problem: Nutritional: Goal: Will attain and maintain optimal nutritional status will improve Outcome: Progressing   Problem: Education: Goal: Ability to describe self-care measures that may prevent or decrease complications (Diabetes Survival Skills Education) will improve Outcome: Progressing

## 2023-01-07 NOTE — Progress Notes (Signed)
2 Days Post-Op   Subjective/Chief Complaint: Patient's pain control has been an issue but better this morning.  Bowels are working.  No nausea vomiting.  Still feels a little bloated.   Objective: Vital signs in last 24 hours: Temp:  [97.6 F (36.4 C)-98.7 F (37.1 C)] 97.6 F (36.4 C) (07/13 0546) Pulse Rate:  [56-76] 56 (07/13 0546) Resp:  [17-18] 18 (07/13 0546) BP: (121-126)/(63-76) 121/76 (07/13 0546) SpO2:  [95 %-98 %] 95 % (07/13 0546) Last BM Date : 01/06/23  Intake/Output from previous day: 07/12 0701 - 07/13 0700 In: 300 [P.O.:300] Out: 150 [Urine:150] Intake/Output this shift: No intake/output data recorded.  Incision: Port sites clean dry intact, lower incision clean dry intact.  Mild distention but no rebound or guarding.  Appropriately tender  Lab Results:  Recent Labs    01/06/23 0410  WBC 11.4*  HGB 13.2  HCT 38.0*  PLT 186   BMET Recent Labs    01/06/23 0410  NA 133*  K 4.4  CL 99  CO2 26  GLUCOSE 144*  BUN 17  CREATININE 1.05  CALCIUM 8.5*   PT/INR No results for input(s): "LABPROT", "INR" in the last 72 hours. ABG No results for input(s): "PHART", "HCO3" in the last 72 hours.  Invalid input(s): "PCO2", "PO2"  Studies/Results: No results found.  Anti-infectives: Anti-infectives (From admission, onward)    Start     Dose/Rate Route Frequency Ordered Stop   01/05/23 2300  cefoTEtan (CEFOTAN) 2 g in sodium chloride 0.9 % 100 mL IVPB        2 g 200 mL/hr over 30 Minutes Intravenous Every 12 hours 01/05/23 1410 01/05/23 2258   01/05/23 1400  neomycin (MYCIFRADIN) tablet 1,000 mg  Status:  Discontinued       Placed in "And" Linked Group   1,000 mg Oral 3 times per day 01/05/23 0841 01/05/23 0846   01/05/23 1400  metroNIDAZOLE (FLAGYL) tablet 1,000 mg  Status:  Discontinued       Placed in "And" Linked Group   1,000 mg Oral 3 times per day 01/05/23 0841 01/05/23 0846   01/05/23 0845  cefoTEtan (CEFOTAN) 2 g in sodium chloride 0.9 %  100 mL IVPB        2 g 200 mL/hr over 30 Minutes Intravenous On call to O.R. 01/05/23 0841 01/05/23 1124       Assessment/Plan: s/p Procedure(s): ROBOTIC COLECTOMY PROXIMAL, BILATERAL TAP BLOCK, PRIMARY UMBICIAL HERNIA REPAIR (N/A) Progressing well  Work on pain control today  Ambulate  Add muscle relaxant  DVT prophylaxis  LOS: 2 days    Dortha Schwalbe MD 01/07/2023

## 2023-01-08 LAB — GLUCOSE, CAPILLARY
Glucose-Capillary: 166 mg/dL — ABNORMAL HIGH (ref 70–99)
Glucose-Capillary: 191 mg/dL — ABNORMAL HIGH (ref 70–99)
Glucose-Capillary: 200 mg/dL — ABNORMAL HIGH (ref 70–99)
Glucose-Capillary: 186 mg/dL — ABNORMAL HIGH (ref 70–99)

## 2023-01-08 MED ORDER — POLYETHYLENE GLYCOL 3350 17 G PO PACK
17.0000 g | PACK | Freq: Every day | ORAL | Status: DC
  Administered 2023-01-08 – 2023-01-11 (×4): 17 g via ORAL
  Filled 2023-01-08 (×4): qty 1

## 2023-01-08 NOTE — Progress Notes (Signed)
3 Days Post-Op   Subjective/Chief Complaint: Patient feels bloated.  Having very small bowel movements.  Still distended.   Objective: Vital signs in last 24 hours: Temp:  [98.2 F (36.8 C)-98.6 F (37 C)] 98.2 F (36.8 C) (07/14 0523) Pulse Rate:  [58-76] 58 (07/14 0523) Resp:  [16-18] 18 (07/14 0523) BP: (111-140)/(64-86) 111/64 (07/14 0523) SpO2:  [94 %-99 %] 94 % (07/14 0523) Last BM Date : 01/06/23  Intake/Output from previous day: 07/13 0701 - 07/14 0700 In: 1260 [P.O.:1260] Out: 0  Intake/Output this shift: No intake/output data recorded.  General appearance: alert and cooperative Resp: clear to auscultation bilaterally Cardio: Normal sinus rhythm Incision/Wound: Port sites clean dry intact.  Incision clean dry intact.  He has more distention today than yesterday  Lab Results:  Recent Labs    01/06/23 0410  WBC 11.4*  HGB 13.2  HCT 38.0*  PLT 186   BMET Recent Labs    01/06/23 0410  NA 133*  K 4.4  CL 99  CO2 26  GLUCOSE 144*  BUN 17  CREATININE 1.05  CALCIUM 8.5*   PT/INR No results for input(s): "LABPROT", "INR" in the last 72 hours. ABG No results for input(s): "PHART", "HCO3" in the last 72 hours.  Invalid input(s): "PCO2", "PO2"  Studies/Results: No results found.  Anti-infectives: Anti-infectives (From admission, onward)    Start     Dose/Rate Route Frequency Ordered Stop   01/05/23 2300  cefoTEtan (CEFOTAN) 2 g in sodium chloride 0.9 % 100 mL IVPB        2 g 200 mL/hr over 30 Minutes Intravenous Every 12 hours 01/05/23 1410 01/05/23 2258   01/05/23 1400  neomycin (MYCIFRADIN) tablet 1,000 mg  Status:  Discontinued       Placed in "And" Linked Group   1,000 mg Oral 3 times per day 01/05/23 0841 01/05/23 0846   01/05/23 1400  metroNIDAZOLE (FLAGYL) tablet 1,000 mg  Status:  Discontinued       Placed in "And" Linked Group   1,000 mg Oral 3 times per day 01/05/23 0841 01/05/23 0846   01/05/23 0845  cefoTEtan (CEFOTAN) 2 g in sodium  chloride 0.9 % 100 mL IVPB        2 g 200 mL/hr over 30 Minutes Intravenous On call to O.R. 01/05/23 0841 01/05/23 1124       Assessment/Plan: s/p Procedure(s): ROBOTIC COLECTOMY PROXIMAL, BILATERAL TAP BLOCK, PRIMARY UMBICIAL HERNIA REPAIR (N/A) Patient with more distention.  Having very spotty bowel movements.  Will add MiraLAX.  Pain control better than previous days.  If patient has nausea or vomiting, check KUB  LOS: 3 days    Dortha Schwalbe MD 01/08/2023

## 2023-01-08 NOTE — Plan of Care (Signed)
  Problem: Education: Goal: Understanding of discharge needs will improve Outcome: Progressing Goal: Verbalization of understanding of the causes of altered bowel function will improve Outcome: Progressing   Problem: Skin Integrity: Goal: Risk for impaired skin integrity will decrease Outcome: Progressing

## 2023-01-09 LAB — CBC
HCT: 35.8 % — ABNORMAL LOW (ref 39.0–52.0)
Hemoglobin: 12.2 g/dL — ABNORMAL LOW (ref 13.0–17.0)
MCH: 32.7 pg (ref 26.0–34.0)
MCHC: 34.1 g/dL (ref 30.0–36.0)
MCV: 96 fL (ref 80.0–100.0)
Platelets: 237 10*3/uL (ref 150–400)
RBC: 3.73 MIL/uL — ABNORMAL LOW (ref 4.22–5.81)
RDW: 13.1 % (ref 11.5–15.5)
WBC: 12.4 10*3/uL — ABNORMAL HIGH (ref 4.0–10.5)
nRBC: 0 % (ref 0.0–0.2)

## 2023-01-09 LAB — GLUCOSE, CAPILLARY
Glucose-Capillary: 140 mg/dL — ABNORMAL HIGH (ref 70–99)
Glucose-Capillary: 139 mg/dL — ABNORMAL HIGH (ref 70–99)
Glucose-Capillary: 141 mg/dL — ABNORMAL HIGH (ref 70–99)
Glucose-Capillary: 136 mg/dL — ABNORMAL HIGH (ref 70–99)

## 2023-01-09 LAB — BASIC METABOLIC PANEL
Anion gap: 9 (ref 5–15)
BUN: 22 mg/dL (ref 8–23)
CO2: 30 mmol/L (ref 22–32)
Calcium: 8.8 mg/dL — ABNORMAL LOW (ref 8.9–10.3)
Chloride: 96 mmol/L — ABNORMAL LOW (ref 98–111)
Creatinine, Ser: 0.99 mg/dL (ref 0.61–1.24)
GFR, Estimated: >60 mL/min (ref >60)
Glucose, Bld: 151 mg/dL — ABNORMAL HIGH (ref 70–99)
Potassium: 3.2 mmol/L — ABNORMAL LOW (ref 3.5–5.1)
Sodium: 135 mmol/L (ref 135–145)

## 2023-01-09 LAB — MAGNESIUM: Magnesium: 1.6 mg/dL — ABNORMAL LOW (ref 1.7–2.4)

## 2023-01-09 LAB — SURGICAL PATHOLOGY

## 2023-01-09 MED ORDER — LACTATED RINGERS IV BOLUS: 1000.0000 mL | Freq: Three times a day (TID) | INTRAVENOUS | Status: AC | PRN

## 2023-01-09 MED ORDER — LACTATED RINGERS IV BOLUS
1000.0000 mL | Freq: Once | INTRAVENOUS | Status: AC
  Administered 2023-01-09: 1000 mL via INTRAVENOUS

## 2023-01-09 MED ORDER — METHOCARBAMOL 500 MG PO TABS
500.0000 mg | ORAL_TABLET | Freq: Four times a day (QID) | ORAL | Status: DC | PRN
  Administered 2023-01-09 – 2023-01-11 (×4): 500 mg via ORAL
  Filled 2023-01-09 (×5): qty 1

## 2023-01-09 MED ORDER — SIMETHICONE 80 MG PO CHEW
80.0000 mg | CHEWABLE_TABLET | Freq: Four times a day (QID) | ORAL | Status: AC
  Administered 2023-01-09 – 2023-01-10 (×4): 80 mg via ORAL
  Filled 2023-01-09 (×7): qty 1

## 2023-01-09 NOTE — Care Management Important Message (Signed)
Important Message  Patient Details IM Letter given. Name: [name] [name] MRN: [id number] Date of Birth: [birth year]   Medicare Important Message Given:  Yes     Caren Macadam 01/09/2023, 10:23 AM

## 2023-01-09 NOTE — Progress Notes (Signed)
01/09/2023  [name] [name] [id number] 1951-03-18  CARE TEAM: PCP: Ardith Dark, MD  Outpatient Care Team: Patient Care Team: Ardith Dark, MD as PCP - General (Family Medicine) [place], Peter M, MD as PCP - Cardiology (Cardiology) Hanley Seamen Dustin Folks, MD as Consulting Physician (Optometry) August Saucer, Corrie Mckusick, MD as Consulting Physician (Orthopedic Surgery) [facility] as Consulting Physician Erroll Luna, [facility] (Inactive) (Pharmacist) Karie Soda, MD as Consulting Physician (General Surgery) Mansouraty, Netty Starring., MD as Consulting Physician (Gastroenterology) [place], Peter M, MD as Consulting Physician (Cardiology)  Inpatient Treatment Team: Treatment Team:  Karie Soda, MD Magier-Nyok, Christene Slates, NT Sabino Snipes, RN Pham, Dyann Ruddle, RPH [facility], Md, MD Chalmers Guest, RN Anselm Pancoast, LCSW   Problem List:   Principal Problem:   Ascending colon mass - recurrent Active Problems:   T2DM (type 2 diabetes mellitus) (HCC)   Hyperlipidemia associated with type 2 diabetes mellitus (HCC)   Hypertension associated with diabetes (HCC)   GERD   Antiplatelet or antithrombotic long-term use   PUD (peptic ulcer disease)   IDA (iron deficiency anemia)   Neoplasm of ascending colon   01/05/2023  POST-OPERATIVE DIAGNOSIS:   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  In the proximal ascending colon mesenteric side was a firm fibrotic mass consistent with the base of the recurrent polyp.  No obvious metastatic disease on visceral parietal peritoneum or liver.   It is an  isoperistaltic ileocolonic anastomosis that rests in the periumbilical region.   1 x 1 cm umbilical hernia with  some sensitivity but reducible.  Primarily repaired.    Assessment [name] = 4 days) 4 Days Post-Op    Bloated    Plan:  ERAS protocol.  Suspect patient developing ileus.  Do scheduled simethicone and soluble fiber.  Back down to dysphagia 1/liquid diet.  NG tube if worse.  Try and keep on the dry side.  IV fluid bolus x 1.  Follow-up in pathology.  Remove dressings  Check labs.  Diabetic control.  Sliding scale insulin.  Restart half dose metformin.  Follow hypertension.  Hold on diuretics for now.  Hold irbesartan for now.  -VTE prophylaxis- SCDs, etc  -mobilize as tolerated to help recovery  -Disposition:  Disposition:  The patient is from: Home Anticipate discharge to:  Home Anticipated Date of Discharge is:  July 13,2024   Barriers to discharge:  Pending Clinical improvement (more likely than not)  Patient currently is NOT MEDICALLY STABLE for discharge from the hospital from a surgery standpoint.      I reviewed nursing notes, last 24 h vitals and pain scores, last 48 h intake and output, last 24 h labs and trends, and last 24 h imaging results.  I have reviewed this patient's available data, including medical history, events of note, test results, etc as part of my evaluation.   A significant portion of that time was spent in counseling. Care during the described time interval was provided by me.  This care required moderate level of medical decision making.  01/09/2023    Subjective: (Chief complaint)  Felt more bloated over weekend.  Scant bowel movement.  Feeling full and bloated on soft foods.  Walking  more.  Pain controlled.  Not nauseated or vomiting.  Objective:  Vital signs:  Vitals:   01/08/23 1327 01/08/23 2112 01/09/23 0436 01/09/23 0500  BP: 123/88 134/89 (!) 142/70   Pulse: 68 75 (!) 58   Resp: 18 17 15    Temp: 98.3 F (36.8 C) 97.6 F (36.4 C) 98.3 F (36.8 C)   TempSrc: Oral Oral Oral   SpO2: 100% 99%  98%   Weight:    79.5 kg  Height:        Last BM Date : 01/06/23  Intake/Output   Yesterday:  07/14 0701 - 07/15 0700 In: 720 [P.O.:720] Out: 450 [Urine:450] This shift:  No intake/output data recorded.  Bowel function:  Flatus:  Scant  BM:   Small smear  Drain: (No drain)   Physical Exam:  General: Pt awake/alert in no acute distress Eyes: PERRL, normal EOM.  Sclera clear.  No icterus Neuro: CN II-XII intact w/o focal sensory/motor deficits. Lymph: No head/neck/groin lymphadenopathy Psych:  No delerium/psychosis/paranoia.  Oriented x 4 HENT: Normocephalic, Mucus membranes moist.  No thrush Neck: Supple, No tracheal deviation.  No obvious thyromegaly Chest: No pain to chest wall compression.  Good respiratory excursion.  No audible wheezing CV:  Pulses intact.  Regular rhythm.  No major extremity edema MS: Normal AROM mjr joints.  No obvious deformity  Abdomen: Soft.  Moderately distended.  Nontender.  dressings clean dry and intact  no evidence of peritonitis.  No incarcerated hernias.  Ext:   No deformity.  No mjr edema.  No cyanosis Skin: No petechiae / purpurea.  No major sores.  Warm and dry    Results:   Cultures: No results found for this or any previous visit (from the past 720 hour(s)).  Labs: Results for orders placed or performed during the hospital encounter of 01/05/23 (from the past 48 hour(s))  Glucose, capillary     Status: Abnormal   Collection Time: 01/07/23 11:30 AM  Result Value Ref Range   Glucose-Capillary 224 (H) 70 - 99 mg/dL    Comment: Glucose reference range applies only to samples taken after fasting for at least 8 hours.  Glucose, capillary     Status: Abnormal   Collection Time: 01/07/23  3:53 PM  Result Value Ref Range   Glucose-Capillary 221 (H) 70 - 99 mg/dL    Comment: Glucose reference range applies only to samples taken after fasting for at least 8 hours.  Glucose, capillary     Status: Abnormal   Collection Time:  01/07/23  9:18 PM  Result Value Ref Range   Glucose-Capillary 142 (H) 70 - 99 mg/dL    Comment: Glucose reference range applies only to samples taken after fasting for at least 8 hours.  Glucose, capillary     Status: Abnormal   Collection Time: 01/08/23  7:20 AM  Result Value Ref Range   Glucose-Capillary 166 (H) 70 - 99 mg/dL    Comment: Glucose reference range applies only to samples taken after fasting for at least 8 hours.  Glucose, capillary     Status: Abnormal   Collection Time: 01/08/23 11:49 AM  Result Value Ref Range   Glucose-Capillary 191 (H) 70 - 99 mg/dL    Comment: Glucose reference range applies only to samples taken after fasting for at least 8 hours.  Glucose, capillary     Status: Abnormal   Collection Time: 01/08/23  4:34 PM  Result Value Ref Range   Glucose-Capillary 200 (H) 70 - 99 mg/dL  Comment: Glucose reference range applies only to samples taken after fasting for at least 8 hours.  Glucose, capillary     Status: Abnormal   Collection Time: 01/08/23  9:12 PM  Result Value Ref Range   Glucose-Capillary 186 (H) 70 - 99 mg/dL    Comment: Glucose reference range applies only to samples taken after fasting for at least 8 hours.  Glucose, capillary     Status: Abnormal   Collection Time: 01/09/23  7:29 AM  Result Value Ref Range   Glucose-Capillary 140 (H) 70 - 99 mg/dL    Comment: Glucose reference range applies only to samples taken after fasting for at least 8 hours.    Imaging / Studies: No results found.  Medications / Allergies: per chart  Antibiotics: Anti-infectives (From admission, onward)    Start     Dose/Rate Route Frequency Ordered Stop   01/05/23 2300  cefoTEtan (CEFOTAN) 2 g in sodium chloride 0.9 % 100 mL IVPB        2 g 200 mL/hr over 30 Minutes Intravenous Every 12 hours 01/05/23 1410 01/05/23 2258   01/05/23 1400  neomycin (MYCIFRADIN) tablet 1,000 mg  Status:  Discontinued       Placed in "And" Linked Group   1,000 mg Oral 3  times per day 01/05/23 0841 01/05/23 0846   01/05/23 1400  metroNIDAZOLE (FLAGYL) tablet 1,000 mg  Status:  Discontinued       Placed in "And" Linked Group   1,000 mg Oral 3 times per day 01/05/23 0841 01/05/23 0846   01/05/23 0845  cefoTEtan (CEFOTAN) 2 g in sodium chloride 0.9 % 100 mL IVPB        2 g 200 mL/hr over 30 Minutes Intravenous On call to O.R. 01/05/23 0841 01/05/23 1124         Note: Portions of this report may have been transcribed using voice recognition software. Every effort was made to ensure accuracy; however, inadvertent computerized transcription errors may be present.   Any transcriptional errors that result from this process are unintentional.    Ardeth Sportsman, MD, FACS, MASCRS Esophageal, Gastrointestinal & Colorectal Surgery Robotic and Minimally Invasive Surgery  [facility] [address], [address] [place], [place] [address] [phone] Fax [phone] Main  CONTACT INFORMATION:  Weekday (9AM-5PM): Call [facility] main office at [phone]  Weeknight (5PM-9AM) or Weekend/Holiday: Check [email or web address] (password " [email or web address]") for General Surgery [facility] coverage  (Please, do not use SecureChat as it is not reliable communication to reach operating surgeons for immediate patient care given surgeries/outpatient duties/clinic/cross-coverage/off post-call which would lead to a delay in care.  Epic staff messaging available for outptient concerns, but may not be answered for 48 hours or more).     01/09/2023  8:55 AM

## 2023-01-10 LAB — GLUCOSE, CAPILLARY
Glucose-Capillary: 116 mg/dL — ABNORMAL HIGH (ref 70–99)
Glucose-Capillary: 198 mg/dL — ABNORMAL HIGH (ref 70–99)
Glucose-Capillary: 129 mg/dL — ABNORMAL HIGH (ref 70–99)

## 2023-01-10 MED ORDER — POTASSIUM CHLORIDE CRYS ER 20 MEQ PO TBCR
40.0000 meq | EXTENDED_RELEASE_TABLET | Freq: Two times a day (BID) | ORAL | Status: DC
  Administered 2023-01-10 – 2023-01-11 (×3): 40 meq via ORAL
  Filled 2023-01-10 (×3): qty 2

## 2023-01-10 MED ORDER — GLUCERNA SHAKE PO LIQD
237.0000 mL | Freq: Two times a day (BID) | ORAL | Status: DC
  Administered 2023-01-10 – 2023-01-11 (×3): 237 mL via ORAL
  Filled 2023-01-10 (×4): qty 237

## 2023-01-10 MED ORDER — MAGNESIUM SULFATE 4 GM/100ML IV SOLN
4.0000 g | Freq: Once | INTRAVENOUS | Status: AC
  Administered 2023-01-10: 4 g via INTRAVENOUS
  Filled 2023-01-10: qty 100

## 2023-01-10 NOTE — Plan of Care (Signed)
Patient walking frequently in hallway.

## 2023-01-10 NOTE — Plan of Care (Signed)
  Problem: Activity: Goal: Ability to tolerate increased activity will improve Outcome: Progressing   Problem: Fluid Volume: Goal: Ability to maintain a balanced intake and output will improve Outcome: Progressing

## 2023-01-10 NOTE — Progress Notes (Signed)
S/p robotic colectomy for recurrent colon mass Path benign - adenoma polyp I told the pt the good news

## 2023-01-10 NOTE — Plan of Care (Signed)
  Problem: Education: Goal: Understanding of discharge needs will improve Outcome: Progressing   Problem: Activity: Goal: Ability to tolerate increased activity will improve Outcome: Progressing   Problem: Bowel/Gastric: Goal: Gastrointestinal status for postoperative course will improve Outcome: Progressing   Problem: Nutritional: Goal: Will attain and maintain optimal nutritional status will improve Outcome: Progressing   Problem: Clinical Measurements: Goal: Postoperative complications will be avoided or minimized Outcome: Progressing   Problem: Respiratory: Goal: Respiratory status will improve Outcome: Progressing   Problem: Skin Integrity: Goal: Will show signs of wound healing Outcome: Progressing   Problem: Education: Goal: Ability to describe self-care measures that may prevent or decrease complications (Diabetes Survival Skills Education) will improve Outcome: Progressing

## 2023-01-10 NOTE — Progress Notes (Addendum)
Pt refusing SS insulin this evening. States he "only wants it when they're high". Provided education.

## 2023-01-10 NOTE — Progress Notes (Signed)
01/10/2023  [name] [name] [id number] 1950-10-15  CARE TEAM: PCP: Ardith Dark, MD  Outpatient Care Team: Patient Care Team: Ardith Dark, MD as PCP - General (Family Medicine) [place], Peter M, MD as PCP - Cardiology (Cardiology) Hanley Seamen Dustin Folks, MD as Consulting Physician (Optometry) August Saucer, Corrie Mckusick, MD as Consulting Physician (Orthopedic Surgery) [facility] as Consulting Physician Erroll Luna, [facility] (Inactive) (Pharmacist) Karie Soda, MD as Consulting Physician (General Surgery) Mansouraty, Netty Starring., MD as Consulting Physician (Gastroenterology) [place], Peter M, MD as Consulting Physician (Cardiology)  Inpatient Treatment Team: Treatment Team:  Karie Soda, MD [facility], Md, MD Lucia Gaskins, [facility] Jamesetta Orleans, LPN Chalmers Guest, RN Anselm Pancoast, LCSW   Problem List:   Principal Problem:   Ascending colon mass - recurrent Active Problems:   T2DM (type 2 diabetes mellitus) (HCC)   Hyperlipidemia associated with type 2 diabetes mellitus (HCC)   Hypertension associated with diabetes (HCC)   GERD   Antiplatelet or antithrombotic long-term use   PUD (peptic ulcer disease)   IDA (iron deficiency anemia)   Neoplasm of ascending colon   01/05/2023  POST-OPERATIVE DIAGNOSIS:   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  In the proximal ascending colon mesenteric side was a firm fibrotic mass consistent with the base of the recurrent polyp.  No obvious metastatic disease on visceral parietal peritoneum or liver.   It is an  isoperistaltic ileocolonic anastomosis that rests in the periumbilical region.   1 x 1 cm umbilical hernia with some sensitivity but  reducible.  Primarily repaired.  FINAL MICROSCOPIC DIAGNOSIS:  A. PROXIMAL RIGHT COLON, RESECTION: Tubular adenoma Adenoma measures 2.0 x 1.5 x 1.0 cm Negative for high-grade dysplasia and carcinoma Margins free of adenomatous change Benign appendix Thirty benign pericolic lymph nodes (0/30)  Assessment [facility] Stay = 5 days) 5 Days Post-Op    Improving    Plan:  ERAS protocol.  With bowel movements and flatus, start advancing diet.  Dysphagia 1 diet since patient does not like sugary foods especially with his diabetes.  Hopefully can advance to soft diet this evening.   NG tube if worse -hopefully less likely  Pathology consistent with adenomatous polyp with no dysplasia or malignancy.  Discussed with patient.  Copy of pathology report in chart.  Reassuring.  Try and keep on the dry side.  IV fluid bolus x 1.  Hypomagnesemia and hypokalemia.  Replacing.  Recheck labs tomorrow.  Diabetic control.  Sliding scale insulin.  Stable on half dose metformin.  Follow hypertension.  Hold on diuretics for now.  Hold irbesartan for now.  -VTE prophylaxis- SCDs, etc  -mobilize as tolerated to help recovery  -Disposition:  Disposition:  The patient is from: Home Anticipate discharge to:  Home Anticipated Date of Discharge is:  July 17,2024   Barriers to discharge:  Pending Clinical improvement (more likely than not)  Patient currently is NOT MEDICALLY STABLE for discharge from the hospital from a surgery standpoint.      I reviewed nursing notes, last 24 h vitals and pain scores, last 48 h intake and output, last 24 h labs and trends, and last 24 h imaging results.  I have reviewed this patient's  available data, including medical history, events of note, test results, etc as part of my evaluation.   A significant portion of that time was spent in counseling. Care during the described time interval was provided by me.  This care required moderate level of medical  decision making.  01/10/2023    Subjective: (Chief complaint)  Patient had bowel movements and flatus feeling much better.  Tolerated liquids.  Wants to try more.  Walking hallways.  Pain controlled.  Objective:  Vital signs:  Vitals:   01/09/23 2154 01/09/23 2155 01/10/23 0500 01/10/23 0512  BP: 137/70 133/72  125/71  Pulse: 65 62  61  Resp: 16   16  Temp: 98.1 F (36.7 C)   98.3 F (36.8 C)  TempSrc: Oral   Oral  SpO2: 99%   97%  Weight:   80.5 kg   Height:        Last BM Date : 01/10/23  Intake/Output   Yesterday:  07/15 0701 - 07/16 0700 In: 750 [P.O.:750] Out: 1000 [Urine:1000] This shift:  No intake/output data recorded.  Bowel function:  Flatus: YES  BM:  YES  Drain: (No drain)   Physical Exam:  General: Pt awake/alert in no acute distress.  Walking in hallways normally without any guarding or hesitancy.  Brisk pace. Eyes: PERRL, normal EOM.  Sclera clear.  No icterus Neuro: CN II-XII intact w/o focal sensory/motor deficits. Lymph: No head/neck/groin lymphadenopathy Psych:  No delerium/psychosis/paranoia.  Oriented x 4 HENT: Normocephalic, Mucus membranes moist.  No thrush Neck: Supple, No tracheal deviation.  No obvious thyromegaly Chest: No pain to chest wall compression.  Good respiratory excursion.  No audible wheezing CV:  Pulses intact.  Regular rhythm.  No major extremity edema MS: Normal AROM mjr joints.  No obvious deformity  Abdomen: Soft.  Moderately distended.  Nontender.  Incisions clean dry and intact  no evidence of peritonitis.  No incarcerated hernias.  Ext:   No deformity.  No mjr edema.  No cyanosis Skin: No petechiae / purpurea.  No major sores.  Warm and dry    Results:    SURGICAL PATHOLOGY CASE: [id number] PATIENT: [name] [name] Surgical Pathology Report     Clinical History: Colon polyps with persistently recurrent ascending colon polyp (crm)     FINAL MICROSCOPIC DIAGNOSIS:  A. PROXIMAL RIGHT  COLON, RESECTION: Tubular adenoma Adenoma measures 2.0 x 1.5 x 1.0 cm Negative for high-grade dysplasia and carcinoma Margins free of adenomatous change Benign appendix Thirty benign pericolic lymph nodes (0/30)   [place] Andy DESCRIPTION:  Specimen: Colon resection clinically identified as "proximal right colon", which includes cecum with attached appendix and portion of terminal ileum, received fresh. Specimen integrity: Intact and unopened Specimen length: There are 4 cm of terminal ileum and colon is 41 cm from cecum to distal margin. Mesorectal intactness: Not applicable Tumor location: Proximal right colon, 1 cm from ileocecal valve. Tumor size: 1.5 cm in length and 2 cm in width tan-red fungating polypoid mass up to 1 cm thick. Percent of bowel circumference involved: Approximately 20% Tumor distance to margins:                      Proximal: 5 cm                      Distal: 34.5 cm                      Radial (posterior ascending): 4  cm to the nonperitonealized pericolonic soft tissue margin. Macroscopic extent of tumor invasion: None Total presumed lymph nodes: Found are 29 possible lymph nodes which range from 0.3 to 0.8 cm. Extramural satellite tumor nodules: None Mucosal polyp(s): None Additional findings: The appendix is 8.3 cm in length, averages 0.6 cm in diameter, has a smooth pink serosa and unremarkable cut surfaces. Block summary: Block 1 = proximal margin Block 2 = distal margin Blocks 3-7 = polypoid mass, entirely submitted Block 8 = appendix Block 9 = 5 possible nodes Block 10 = 5 possible nodes Block 11 = 5 possible nodes Block 12 = 5 possible nodes Block 13 = 5 possible nodes Block 14 = 4 possible nodes  SW 01/06/2023    Final Diagnosis performed by Jerene Bears, MD.   Electronically signed 01/09/2023 Technical component performed at [facility], [address] [place], [place] [address].  Professional component performed  at [employer], [address] [place], [place] [address].  Immunohistochemistry Technical component (if applicable) was performed at [facility]. [address], [place], [place] [address].   IMMUNOHISTOCHEMISTRY DISCLAIMER (if applicable): Some of these immunohistochemical stains may have been developed and the performance characteristics determine by [facility]. Some may not have been cleared or approved by the U.S. Food and Drug Administration. The FDA has determined that such clearance or approval is not necessary. This test is used for clinical purposes. It should not be regarded as investigational or for research. This laboratory is certified under the Clinical Laboratory Improvement Amendments of 1988 (CLIA-88) as qualified to perform high complexity clinical laboratory testing.  The controls stained appropriately.   IHC stains are performed on formalin fixed, paraffin embedded tissue using a 3,3"diaminobenzidine (DAB) chromogen and Leica Bond Autostainer System. The staining intensity of the nucleus is score manually and is reported as the percentage of tumor cell nuclei demonstrating specific nuclear staining. The specimens are fixed in 10% Neutral Formalin for at least 6 hours and up to 72hrs. These tests are validated on decalcified tissue. Results should be interpreted with caution given the possibility of false negative results on decalcified specimens. Antibody Clones are as follows ER-clone 60F, PR-clone 16, Ki67- clone MM1. Some of these immunohistochemical stains may have been developed and the performance characteristics determined by [facility].    Cultures: No results found for this or any previous visit (from the past 720 hour(s)).  Labs: Results for orders placed or performed during the hospital encounter of 01/05/23 (from the past 48 hour(s))  Glucose, capillary     Status: Abnormal    Collection Time: 01/08/23 11:49 AM  Result Value Ref Range   Glucose-Capillary 191 (H) 70 - 99 mg/dL    Comment: Glucose reference range applies only to samples taken after fasting for at least 8 hours.  Glucose, capillary     Status: Abnormal   Collection Time: 01/08/23  4:34 PM  Result Value Ref Range   Glucose-Capillary 200 (H) 70 - 99 mg/dL    Comment: Glucose reference range applies only to samples taken after fasting for at least 8 hours.  Glucose, capillary     Status: Abnormal   Collection Time: 01/08/23  9:12 PM  Result Value Ref Range   Glucose-Capillary 186 (H) 70 - 99 mg/dL    Comment: Glucose reference range applies only to samples taken after fasting for at least 8 hours.  Glucose, capillary     Status: Abnormal  Collection Time: 01/09/23  7:29 AM  Result Value Ref Range   Glucose-Capillary 140 (H) 70 - 99 mg/dL    Comment: Glucose reference range applies only to samples taken after fasting for at least 8 hours.  Magnesium     Status: Abnormal   Collection Time: 01/09/23  9:27 AM  Result Value Ref Range   Magnesium 1.6 (L) 1.7 - 2.4 mg/dL    Comment: Performed at [facility], [address], [place], [place] [address]  Basic metabolic panel     Status: Abnormal   Collection Time: 01/09/23  9:27 AM  Result Value Ref Range   Sodium 135 135 - 145 mmol/L   Potassium 3.2 (L) 3.5 - 5.1 mmol/L   Chloride 96 (L) 98 - 111 mmol/L   CO2 30 22 - 32 mmol/L   Glucose, Bld 151 (H) 70 - 99 mg/dL    Comment: Glucose reference range applies only to samples taken after fasting for at least 8 hours.   BUN 22 8 - 23 mg/dL   Creatinine, Ser 6.04 0.61 - 1.24 mg/dL   Calcium 8.8 (L) 8.9 - 10.3 mg/dL   GFR, Estimated >54 >09 mL/min    Comment: (NOTE) Calculated using the CKD-EPI Creatinine Equation (2021)    Anion gap 9 5 - 15    Comment: Performed at [facility], [address] [place], [place] [address]  CBC     Status: Abnormal   Collection  Time: 01/09/23  9:27 AM  Result Value Ref Range   WBC 12.4 (H) 4.0 - 10.5 K/uL   RBC 3.73 (L) 4.22 - 5.81 MIL/uL   Hemoglobin 12.2 (L) 13.0 - 17.0 g/dL   HCT 47.8 (L) 29.5 - 62.1 %   MCV 96.0 80.0 - 100.0 fL   MCH 32.7 26.0 - 34.0 pg   MCHC 34.1 30.0 - 36.0 g/dL   RDW 30.8 65.7 - 84.6 %   Platelets 237 150 - 400 K/uL   nRBC 0.0 0.0 - 0.2 %    Comment: Performed at [facility], [address] [place], [place] [address]  Glucose, capillary     Status: Abnormal   Collection Time: 01/09/23 11:26 AM  Result Value Ref Range   Glucose-Capillary 139 (H) 70 - 99 mg/dL    Comment: Glucose reference range applies only to samples taken after fasting for at least 8 hours.  Glucose, capillary     Status: Abnormal   Collection Time: 01/09/23  5:44 PM  Result Value Ref Range   Glucose-Capillary 141 (H) 70 - 99 mg/dL    Comment: Glucose reference range applies only to samples taken after fasting for at least 8 hours.  Glucose, capillary     Status: Abnormal   Collection Time: 01/09/23  9:54 PM  Result Value Ref Range   Glucose-Capillary 136 (H) 70 - 99 mg/dL    Comment: Glucose reference range applies only to samples taken after fasting for at least 8 hours.  Glucose, capillary     Status: Abnormal   Collection Time: 01/10/23  7:30 AM  Result Value Ref Range   Glucose-Capillary 116 (H) 70 - 99 mg/dL    Comment: Glucose reference range applies only to samples taken after fasting for at least 8 hours.    Imaging / Studies: No results found.  Medications / Allergies: per chart  Antibiotics: Anti-infectives (From admission, onward)    Start     Dose/Rate Route Frequency Ordered Stop  01/05/23 2300  cefoTEtan (CEFOTAN) 2 g in sodium chloride 0.9 % 100 mL IVPB        2 g 200 mL/hr over 30 Minutes Intravenous Every 12 hours 01/05/23 1410 01/05/23 2258   01/05/23 1400  neomycin (MYCIFRADIN) tablet 1,000 mg  Status:  Discontinued       Placed in "And" Linked Group   1,000  mg Oral 3 times per day 01/05/23 0841 01/05/23 0846   01/05/23 1400  metroNIDAZOLE (FLAGYL) tablet 1,000 mg  Status:  Discontinued       Placed in "And" Linked Group   1,000 mg Oral 3 times per day 01/05/23 0841 01/05/23 0846   01/05/23 0845  cefoTEtan (CEFOTAN) 2 g in sodium chloride 0.9 % 100 mL IVPB        2 g 200 mL/hr over 30 Minutes Intravenous On call to O.R. 01/05/23 0841 01/05/23 1124         Note: Portions of this report may have been transcribed using voice recognition software. Every effort was made to ensure accuracy; however, inadvertent computerized transcription errors may be present.   Any transcriptional errors that result from this process are unintentional.    Ardeth Sportsman, MD, FACS, MASCRS Esophageal, Gastrointestinal & Colorectal Surgery Robotic and Minimally Invasive Surgery  [facility] [address] [place], [place] [address] [phone] Fax [phone] Main  CONTACT INFORMATION:  Weekday (9AM-5PM): Call [facility] main office at [phone]  Weeknight (5PM-9AM) or Weekend/Holiday: Check [email or web address] (password " [email or web address]") for General Surgery [facility] coverage  (Please, do not use SecureChat as it is not reliable communication to reach operating surgeons for immediate patient care given surgeries/outpatient duties/clinic/cross-coverage/off post-call which would lead to a delay in care.  Epic staff messaging available for outptient concerns, but may not be answered for 48 hours or more).     01/10/2023  9:06 AM

## 2023-01-10 NOTE — Progress Notes (Signed)
Pharmacy Brief Note - Alvimopan (Entereg)  The standing order set for alvimopan (Entereg) now includes an automatic order to discontinue the drug after the patient has had a bowel movement. The change was approved by the Pharmacy & Therapeutics Committee and the Medical Executive Committee.   This patient has had bowel movements documented by nursing. Therefore, alvimopan has been discontinued. If there are questions, please contact the pharmacy at [phone].   Thank you-   Dorna Leitz, PharmD, BCPS 01/10/2023 8:50 AM

## 2023-01-11 LAB — GLUCOSE, CAPILLARY
Glucose-Capillary: 162 mg/dL — ABNORMAL HIGH (ref 70–99)
Glucose-Capillary: 100 mg/dL — ABNORMAL HIGH (ref 70–99)

## 2023-01-11 LAB — CBC
HCT: 32.8 % — ABNORMAL LOW (ref 39.0–52.0)
Hemoglobin: 11.3 g/dL — ABNORMAL LOW (ref 13.0–17.0)
MCH: 33 pg (ref 26.0–34.0)
MCHC: 34.5 g/dL (ref 30.0–36.0)
MCV: 95.9 fL (ref 80.0–100.0)
Platelets: 239 10*3/uL (ref 150–400)
RBC: 3.42 MIL/uL — ABNORMAL LOW (ref 4.22–5.81)
RDW: 13.2 % (ref 11.5–15.5)
WBC: 10.3 10*3/uL (ref 4.0–10.5)
nRBC: 0 % (ref 0.0–0.2)

## 2023-01-11 LAB — CREATININE, SERUM
Creatinine, Ser: 0.9 mg/dL (ref 0.61–1.24)
GFR, Estimated: >60 mL/min (ref >60)

## 2023-01-11 LAB — POTASSIUM: Potassium: 3.9 mmol/L (ref 3.5–5.1)

## 2023-01-11 NOTE — Discharge Summary (Signed)
Physician Discharge Summary    Patient ID: [name] [name] MRN: [id number] DOB/AGE: [birth year]  72 y.o.  Patient Care Team: Ardith Dark, MD as PCP - General (Family Medicine) [place], Peter M, MD as PCP - Cardiology (Cardiology) Hanley Seamen Dustin Folks, MD as Consulting Physician (Optometry) August Saucer, Corrie Mckusick, MD as Consulting Physician (Orthopedic Surgery) [facility] 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 01/05/2023 Active Problems:   T2DM (type 2 diabetes mellitus) (HCC)   Hyperlipidemia associated with type 2 diabetes mellitus (HCC)   Hypertension associated with diabetes (HCC)   GERD   Antiplatelet or antithrombotic long-term use   PUD (peptic ulcer disease)   Ascending colon mass - recurrent   IDA (iron deficiency anemia)   Neoplasm of ascending colon   6 Days Post-Op  01/05/2023  POST-OPERATIVE DIAGNOSIS:   RECURRENT ASCENDING COLON POLYP UMBILICAL HERNIA WITH PAIN 1X1CM   PROCEDURE:   ROBOTIC PROXIMAL "RIGHT" COLECTOMY PRIMARY UMBILICAL HERNIA REPAIR TRANSVERSUS ABDOMINIS PLANE (TAP) BLOCK - BILATERAL   SURGEON:  Ardeth Sportsman, MD    OR FINDINGS:    Patient had chronic tattooing in the posterior ascending colon.  In the proximal ascending colon mesenteric side was a firm fibrotic mass consistent with the base of the recurrent polyp.  No obvious metastatic disease on visceral parietal peritoneum or liver.   It is an  isoperistaltic ileocolonic anastomosis that rests in the periumbilical region.   1 x 1 cm umbilical hernia with some sensitivity  but reducible.  Primarily repaired.   FINAL MICROSCOPIC DIAGNOSIS:  A. PROXIMAL RIGHT COLON, RESECTION: Tubular adenoma Adenoma measures 2.0 x 1.5 x 1.0 cm Negative for high-grade dysplasia and carcinoma Margins free of adenomatous change Benign appendix Thirty benign pericolic lymph nodes (0/30)    Consults: Case Management / Social Work and Anesthesia  Hospital Course:   The patient underwent the surgery above.  Postoperatively, the patient gradually mobilized and advanced to a solid diet.  Pain and other symptoms were treated aggressively.    By the time of discharge, the patient was walking well the hallways, eating food, having flatus.  Pain was well-controlled on an oral medications.  Based on meeting discharge criteria and continuing to recover, I felt it was safe for the patient to be discharged from the hospital to further recover with close followup. Postoperative recommendations were discussed in detail.  They are written as well.  Discharged Condition: good  Discharge Exam: Blood pressure 128/72, pulse (!) 59, temperature (!) 97.5 F (36.4 C), temperature source Oral, resp. rate 17, height 5' 8.5" (1.74 m), weight 80.5 kg, SpO2 96%.  General: Pt awake/alert/oriented x4 in No acute distress Eyes: PERRL, normal EOM.  Sclera clear.  No icterus Neuro: CN II-XII intact w/o focal sensory/motor deficits. Lymph: No head/neck/groin lymphadenopathy Psych:  No delerium/psychosis/paranoia HENT: Normocephalic, Mucus membranes moist.  No thrush Neck: Supple, No tracheal deviation Chest:  No chest wall pain w good excursion CV:  Pulses intact.  Regular rhythm MS: Normal AROM mjr joints.  No obvious deformity Abdomen: Soft.  Nondistended.  Nontender.  No evidence of peritonitis.  No incarcerated hernias. Ext:  SCDs BLE.  No mjr  edema.  No cyanosis Skin: No petechiae / purpura   Disposition:    Follow-up Information     Karie Soda, MD Follow up on 02/02/2023.   Specialties:  General Surgery, Colon and Rectal Surgery Contact information: [address] [place] [address] [phone]                 Discharge disposition: 01-Home or Self Care       Discharge Instructions     Call MD for:   Complete by: As directed    FEVER > 101.5 F  (temperatures < 101.5 F are not significant)   Call MD for:  extreme fatigue   Complete by: As directed    Call MD for:  persistant dizziness or light-headedness   Complete by: As directed    Call MD for:  persistant nausea and vomiting   Complete by: As directed    Call MD for:  redness, tenderness, or signs of infection (pain, swelling, redness, odor or green/yellow discharge around incision site)   Complete by: As directed    Call MD for:  severe uncontrolled pain   Complete by: As directed    Diet - low sodium heart healthy   Complete by: As directed    Start with a bland diet such as soups, liquids, starchy foods, low fat foods, etc. the first few days at home. Gradually advance to a solid, low-fat, high fiber diet by the end of the first week at home.   Add a fiber supplement to your diet (Metamucil, etc) If you feel full, bloated, or constipated, stay on a full liquid or pureed/blenderized diet for a few days until you feel better and are no longer constipated.   Discharge instructions   Complete by: As directed    See Discharge Instructions If you are not getting better after two weeks or are noticing you are getting worse, contact our office [phone] for further advice.  We may need to adjust your medications, re-evaluate you in the office, send you to the emergency room, or see what other things we can do to help. The clinic staff is available to answer your questions during regular business hours (8:30am-5pm).  Please don't hesitate to call and ask to speak to one of our nurses for clinical concerns.    A surgeon from [facility] is always on call at the hospitals  24 hours/day If you have a medical emergency, go to the nearest emergency room or call 911.   Driving Restrictions   Complete by: As directed    You may drive when: - you are no longer taking narcotic prescription pain medication - you can comfortably wear a seatbelt - you can safely make sudden turns/stops without pain.   Increase activity slowly   Complete by: As directed    Start light daily activities --- self-care, walking, climbing stairs- beginning the day after surgery.  Gradually increase activities as tolerated.  Control your pain to be active.  Stop when you are tired.  Ideally, walk several times a day, eventually an hour a day.   Most people are back to most day-to-day activities in a few weeks.  It takes 4-6 weeks to get back to unrestricted, intense activity. If you can walk 30 minutes without difficulty, it is safe to try more intense activity such as jogging, treadmill, bicycling, low-impact aerobics, swimming, etc. Save the most intensive and strenuous activity for last (Usually 4-8 weeks after surgery) such  as sit-ups, heavy lifting, contact sports, etc.  Refrain from any intense heavy lifting or straining until you are off narcotics for pain control.  You will have off days, but things should improve week-by-week. DO NOT PUSH THROUGH PAIN.  Let pain be your guide: If it hurts to do something, don't do it.   Lifting restrictions   Complete by: As directed    If you can walk 30 minutes without difficulty, it is safe to try more intense activity such as jogging, treadmill, bicycling, low-impact aerobics, swimming, etc. Save the most intensive and strenuous activity for last (Usually 4-8 weeks after surgery) such as sit-ups, heavy lifting, contact sports, etc.   Refrain from any intense heavy lifting or straining until you are off narcotics for pain control.  You will have off days, but things should improve week-by-week. DO NOT PUSH THROUGH PAIN.  Let pain be your guide: If it  hurts to do something, don't do it.  Pain is your body warning you to avoid that activity for another week until the pain goes down.   May shower / Bathe   Complete by: As directed    May walk up steps   Complete by: As directed    Remove dressing in 72 hours   Complete by: As directed    Make sure all dressings are removed by the third day after surgery.  Leave incisions open to air.  OK to cover incisions with gauze or bandages as desired   Sexual Activity Restrictions   Complete by: As directed    You may have sexual intercourse when it is comfortable. If it hurts to do something, stop.       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   atorvastatin 80 MG tablet Commonly known as: LIPITOR TAKE 1 TABLET DAILY AT     6:00PM   carvedilol 3.125 MG tablet Commonly known as: COREG TAKE 1 TABLET TWICE DAILY  WITH MEALS   clopidogrel 75 MG tablet Commonly known as: PLAVIX TAKE 1 TABLET DAILY   CoQ10 100 MG Caps Take 200 mg by mouth daily.   cyanocobalamin 1000 MCG tablet Commonly known as: VITAMIN B12 Take 1,000 mcg by mouth daily.   ferrous sulfate 325 (65 FE) MG tablet Take 325 mg by mouth daily.   FIBER PO Take 3 capsules by mouth daily.   FUNGI-NAIL TOE & FOOT EX Apply 1 application  topically daily as needed (fungus).   glucose blood test strip Use as instructed   glucose blood test strip Use as instructed   hydrochlorothiazide  25 MG tablet Commonly known as: HYDRODIURIL Take 1 tablet (25 mg total) by mouth daily.   irbesartan 300 MG tablet Commonly known as: AVAPRO TAKE 1 TABLET DAILY   metFORMIN 500 MG 24 hr tablet Commonly known as: GLUCOPHAGE-XR TAKE 2 TABLETS DAILY AFTER SUPPER   multivitamin with minerals tablet Take  1 tablet by mouth daily.   nitroGLYCERIN 0.4 MG SL tablet Commonly known as: NITROSTAT Place 1 tablet (0.4 mg total) under the tongue every 5 (five) minutes as needed for chest pain.   pantoprazole 40 MG tablet Commonly known as: PROTONIX TAKE 1 TABLET TWICE DAILY  BEFORE MEALS What changed: See the new instructions.   sildenafil 20 MG tablet Commonly known as: REVATIO Take 1-5 tablets by mouth daily as needed.   spironolactone 25 MG tablet Commonly known as: ALDACTONE TAKE 1 TABLET DAILY   Systane Complete PF 0.6 % Soln Generic drug: Propylene Glycol (PF) Place 1 drop into both eyes daily as needed (dry eyes).   traMADol 50 MG tablet Commonly known as: ULTRAM Take 1-2 tablets (50-100 mg total) by mouth every 6 (six) hours as needed for moderate pain or severe pain.   Whey Protein Powd Take 2 Scoops by mouth daily.        Significant Diagnostic Studies:  SURGICAL PATHOLOGY CASE: [id number] PATIENT: Isacc Henton Surgical Pathology Report     Clinical History: Colon polyps with persistently recurrent ascending colon polyp (crm)     FINAL MICROSCOPIC DIAGNOSIS:  A. PROXIMAL RIGHT COLON, RESECTION: Tubular adenoma Adenoma measures 2.0 x 1.5 x 1.0 cm Negative for high-grade dysplasia and carcinoma Margins free of adenomatous change Benign appendix Thirty benign pericolic lymph nodes (0/30)   Kariya Lavergne DESCRIPTION:  Specimen: Colon resection clinically identified as "proximal right colon", which includes cecum with attached appendix and portion of terminal ileum, received fresh. Specimen integrity: Intact and unopened Specimen length: There are 4 cm  of terminal ileum and colon is 41 cm from cecum to distal margin. Mesorectal intactness: Not applicable Tumor location: Proximal right colon, 1 cm from ileocecal valve. Tumor size: 1.5 cm in length and 2 cm in width tan-red fungating polypoid mass up to 1 cm thick. Percent of bowel circumference involved: Approximately 20% Tumor distance to margins:                      Proximal: 5 cm                      Distal: 34.5 cm                      Radial (posterior ascending): 4 cm to the nonperitonealized pericolonic soft tissue margin. Macroscopic extent of tumor invasion: None Total presumed lymph nodes: Found are 29 possible lymph nodes which range from 0.3 to 0.8 cm. Extramural satellite tumor nodules: None Mucosal polyp(s): None Additional findings: The appendix is 8.3 cm in length, averages 0.6 cm in diameter, has a smooth pink serosa and unremarkable cut surfaces. Block summary: Block 1 = proximal margin Block 2 = distal margin Blocks 3-7 = polypoid mass, entirely submitted Block 8 = appendix Block 9 = 5 possible nodes Block 10 = 5 possible nodes Block 11 = 5 possible nodes Block 12 = 5 possible nodes Block 13 = 5 possible nodes Block 14 = 4 possible nodes  SW 01/06/2023    Final Diagnosis performed by Jerene Bears, MD.   Electronically signed 01/09/2023 Technical component performed at [facility], [address], [place], [place] [address].  Professional component performed at [employer], [address] [place], [place] [address].  Immunohistochemistry Technical component (if applicable) was performed at [facility]. [address], [place], [place] [address].   IMMUNOHISTOCHEMISTRY DISCLAIMER (if  applicable): Some of these immunohistochemical stains may have been developed and the performance characteristics determine by [facility]. Some may not have been cleared or approved by the  U.S. Food and Drug Administration. The FDA has determined that such clearance or approval is not necessary. This test is used for clinical purposes. It should not be regarded as investigational or for research. This laboratory is certified under the Clinical Laboratory Improvement Amendments of 1988 (CLIA-88) as qualified to perform high complexity clinical laboratory testing.  The controls stained appropriately.   IHC stains are performed on formalin fixed, paraffin embedded tissue using a 3,3"diaminobenzidine (DAB) chromogen and Leica Bond Autostainer System. The staining intensity of the nucleus is score manually and is reported as the percentage of tumor cell nuclei demonstrating specific nuclear staining. The specimens are fixed in 10% Neutral Formalin for at least 6 hours and up to 72hrs. These tests are validated on decalcified tissue. Results should be interpreted with caution given the possibility of false negative results on decalcified specimens. Antibody Clones are as follows ER-clone 24F, PR-clone 16, Ki67- clone MM1. Some of these immunohistochemical stains may have been developed and the performance characteristics determined by [facility].     Results for orders placed or performed during the hospital encounter of 01/05/23 (from the past 72 hour(s))  Glucose, capillary     Status: Abnormal   Collection Time: 01/08/23 11:49 AM  Result Value Ref Range   Glucose-Capillary 191 (H) 70 - 99 mg/dL    Comment: Glucose reference range applies only to samples taken after fasting for at least 8 hours.  Glucose, capillary     Status: Abnormal   Collection Time: 01/08/23  4:34 PM  Result Value Ref Range   Glucose-Capillary 200 (H) 70 - 99 mg/dL    Comment: Glucose reference range applies only to samples taken after fasting for at least 8 hours.  Glucose, capillary     Status: Abnormal   Collection Time: 01/08/23  9:12 PM  Result Value Ref Range   Glucose-Capillary 186  (H) 70 - 99 mg/dL    Comment: Glucose reference range applies only to samples taken after fasting for at least 8 hours.  Glucose, capillary     Status: Abnormal   Collection Time: 01/09/23  7:29 AM  Result Value Ref Range   Glucose-Capillary 140 (H) 70 - 99 mg/dL    Comment: Glucose reference range applies only to samples taken after fasting for at least 8 hours.  Magnesium     Status: Abnormal   Collection Time: 01/09/23  9:27 AM  Result Value Ref Range   Magnesium 1.6 (L) 1.7 - 2.4 mg/dL    Comment: Performed at [facility], [address], [place], [place] [address]  Basic metabolic panel     Status: Abnormal   Collection Time: 01/09/23  9:27 AM  Result Value Ref Range   Sodium 135 135 - 145 mmol/L   Potassium 3.2 (L) 3.5 - 5.1 mmol/L   Chloride 96 (L) 98 - 111 mmol/L   CO2 30 22 - 32 mmol/L   Glucose, Bld 151 (H) 70 - 99 mg/dL    Comment: Glucose reference range applies only to samples taken after fasting for at least 8 hours.   BUN 22 8 - 23 mg/dL   Creatinine, Ser 7.56 0.61 - 1.24 mg/dL   Calcium 8.8 (L) 8.9 - 10.3 mg/dL   GFR, Estimated >43 >32 mL/min    Comment: (NOTE) Calculated using the  CKD-EPI Creatinine Equation (2021)    Anion gap 9 5 - 15    Comment: Performed at [facility], [address] [place], [place] [address]  CBC     Status: Abnormal   Collection Time: 01/09/23  9:27 AM  Result Value Ref Range   WBC 12.4 (H) 4.0 - 10.5 K/uL   RBC 3.73 (L) 4.22 - 5.81 MIL/uL   Hemoglobin 12.2 (L) 13.0 - 17.0 g/dL   HCT 19.1 (L) 47.8 - 29.5 %   MCV 96.0 80.0 - 100.0 fL   MCH 32.7 26.0 - 34.0 pg   MCHC 34.1 30.0 - 36.0 g/dL   RDW 62.1 30.8 - 65.7 %   Platelets 237 150 - 400 K/uL   nRBC 0.0 0.0 - 0.2 %    Comment: Performed at [facility], [address] [place], [place] [address]  Glucose, capillary     Status: Abnormal   Collection Time: 01/09/23 11:26 AM  Result Value Ref Range   Glucose-Capillary 139  (H) 70 - 99 mg/dL    Comment: Glucose reference range applies only to samples taken after fasting for at least 8 hours.  Glucose, capillary     Status: Abnormal   Collection Time: 01/09/23  5:44 PM  Result Value Ref Range   Glucose-Capillary 141 (H) 70 - 99 mg/dL    Comment: Glucose reference range applies only to samples taken after fasting for at least 8 hours.  Glucose, capillary     Status: Abnormal   Collection Time: 01/09/23  9:54 PM  Result Value Ref Range   Glucose-Capillary 136 (H) 70 - 99 mg/dL    Comment: Glucose reference range applies only to samples taken after fasting for at least 8 hours.  Glucose, capillary     Status: Abnormal   Collection Time: 01/10/23  7:30 AM  Result Value Ref Range   Glucose-Capillary 116 (H) 70 - 99 mg/dL    Comment: Glucose reference range applies only to samples taken after fasting for at least 8 hours.  Glucose, capillary     Status: Abnormal   Collection Time: 01/10/23 11:31 AM  Result Value Ref Range   Glucose-Capillary 198 (H) 70 - 99 mg/dL    Comment: Glucose reference range applies only to samples taken after fasting for at least 8 hours.  Glucose, capillary     Status: Abnormal   Collection Time: 01/10/23  4:35 PM  Result Value Ref Range   Glucose-Capillary 129 (H) 70 - 99 mg/dL    Comment: Glucose reference range applies only to samples taken after fasting for at least 8 hours.  Glucose, capillary     Status: Abnormal   Collection Time: 01/10/23  9:20 PM  Result Value Ref Range   Glucose-Capillary 162 (H) 70 - 99 mg/dL    Comment: Glucose reference range applies only to samples taken after fasting for at least 8 hours.  Creatinine, serum     Status: None   Collection Time: 01/11/23  4:37 AM  Result Value Ref Range   Creatinine, Ser 0.90 0.61 - 1.24 mg/dL   GFR, Estimated >29 >52 mL/min    Comment: (NOTE) Calculated using the CKD-EPI Creatinine Equation (2021) Performed at [facility], [address], [place], [place] [address]   Potassium     Status: None   Collection Time: 01/11/23  4:37 AM  Result Value Ref Range   Potassium 3.9 3.5 - 5.1 mmol/L    Comment: Performed  at [name], [address], [place], [place] [address]  CBC     Status: Abnormal   Collection Time: 01/11/23  4:37 AM  Result Value Ref Range   WBC 10.3 4.0 - 10.5 K/uL   RBC 3.42 (L) 4.22 - 5.81 MIL/uL   Hemoglobin 11.3 (L) 13.0 - 17.0 g/dL   HCT 32.9 (L) 51.8 - 84.1 %   MCV 95.9 80.0 - 100.0 fL   MCH 33.0 26.0 - 34.0 pg   MCHC 34.5 30.0 - 36.0 g/dL   RDW 66.0 63.0 - 16.0 %   Platelets 239 150 - 400 K/uL   nRBC 0.0 0.0 - 0.2 %    Comment: Performed at [facility], [address] [place], [place] [address]  Glucose, capillary     Status: Abnormal   Collection Time: 01/11/23  7:09 AM  Result Value Ref Range   Glucose-Capillary 100 (H) 70 - 99 mg/dL    Comment: Glucose reference range applies only to samples taken after fasting for at least 8 hours.    No results found.  Past Medical History:  Diagnosis Date   Adenomatous colon polyp 09/07/2004   Amblyopia    Anemia    Arthritis    CAD (coronary artery disease)    cardiac CT with a 50% LAD lesion in 2007   Diverticulosis    DM2 (diabetes mellitus, type 2) (HCC)    Elevated LFTs    secondary to labetalol   Esophageal stricture 2003   External hemorrhoids 2012   lanced by Dr Zachery Dakins   Family history of anesthesia complication    "sister had PONV"   GERD (gastroesophageal reflux disease)    esophageal stricture, pmh   Hiatal hernia 01/04/2000   History of kidney stones 2022   HLD (hyperlipidemia)    HTN (hypertension)    Hypopotassemia     Past Surgical History:  Procedure Laterality Date   BIOPSY  06/05/2021   Procedure: BIOPSY;  Surgeon: Iva Boop, MD;  Location: [facility] ENDOSCOPY;  Service: Endoscopy;;   BIOPSY  09/02/2021   Procedure: BIOPSY;  Surgeon: Lemar Lofty., MD;  Location:  [facility] ENDOSCOPY;  Service: Gastroenterology;;   COLONOSCOPY  06/27/2004   tics , ADENOMATOUS polyps. F/U due 2011   COLONOSCOPY  06/27/2010   Tics   COLONOSCOPY WITH PROPOFOL N/A 09/02/2021   Procedure: COLONOSCOPY WITH PROPOFOL;  Surgeon: Meridee Score Netty Starring., MD;  Location: [facility] ENDOSCOPY;  Service: Gastroenterology;  Laterality: N/A;   COLONOSCOPY WITH PROPOFOL N/A 04/14/2022   Procedure: COLONOSCOPY WITH PROPOFOL;  Surgeon: Meridee Score Netty Starring., MD;  Location: [facility]COPY;  Service: Gastroenterology;  Laterality: N/A;   COLONOSCOPY WITH PROPOFOL N/A 10/13/2022   Procedure: COLONOSCOPY WITH PROPOFOL;  Surgeon: Meridee Score Netty Starring., MD;  Location: [facility];  Service: Gastroenterology;  Laterality: N/A;   CORONARY ANGIOPLASTY WITH STENT PLACEMENT  04/01/2014   "2"   ENDOSCOPIC MUCOSAL RESECTION  09/02/2021   Procedure: ENDOSCOPIC MUCOSAL RESECTION;  Surgeon: Meridee Score Netty Starring., MD;  Location: [facility] ENDOSCOPY;  Service: Gastroenterology;;   ENDOSCOPIC MUCOSAL RESECTION N/A 04/14/2022   Procedure: ENDOSCOPIC MUCOSAL RESECTION;  Surgeon: Lemar Lofty., MD;  Location: [facility]OS[facility];  Service: Gastroenterology;  Laterality: N/A;   ESOPHAGEAL BRUSHING  04/14/2022   Procedure: ESOPHAGEAL BRUSHING;  Surgeon: Lemar Lofty., MD;  Location: [facility]OS[facility];  Service: Gastroenterology;;   ESOPHAGEAL DILATION  06/27/2001   ESOPHAGOGASTRODUODENOSCOPY  06/27/2001   ERD, stricture dilated   ESOPHAGOGASTRODUODENOSCOPY (EGD) WITH PROPOFOL N/A  06/05/2021   Procedure: ESOPHAGOGASTRODUODENOSCOPY (EGD) WITH PROPOFOL;  Surgeon: Iva Boop, MD;  Location: [facility];  Service: Endoscopy;  Laterality: N/A;   ESOPHAGOGASTRODUODENOSCOPY (EGD) WITH PROPOFOL N/A 09/02/2021   Procedure: ESOPHAGOGASTRODUODENOSCOPY (EGD) WITH PROPOFOL;  Surgeon: Meridee Score Netty Starring., MD;  Location: [facility] ENDOSCOPY;  Service: Gastroenterology;  Laterality: N/A;   ESOPHAGOGASTRODUODENOSCOPY (EGD) WITH PROPOFOL  N/A 04/14/2022   Procedure: ESOPHAGOGASTRODUODENOSCOPY (EGD) WITH PROPOFOL;  Surgeon: Meridee Score Netty Starring., MD;  Location: [facility];  Service: Gastroenterology;  Laterality: N/A;   EXCISIONAL HEMORRHOIDECTOMY  ~ 2008   FULL THICKNESS RESECTION (FTRD) N/A 10/13/2022   Procedure: FULL THICKNESS RESECTION (FTRD);  Surgeon: Lemar Lofty., MD;  Location: [facility];  Service: Gastroenterology;  Laterality: N/A;   HEMOSTASIS CLIP PLACEMENT  09/02/2021   Procedure: HEMOSTASIS CLIP PLACEMENT;  Surgeon: Lemar Lofty., MD;  Location: [facility] ENDOSCOPY;  Service: Gastroenterology;;   HEMOSTASIS CLIP PLACEMENT  10/13/2022   Procedure: HEMOSTASIS CLIP PLACEMENT;  Surgeon: Lemar Lofty., MD;  Location: [facility] ENDOSCOPY;  Service: Gastroenterology;;   HEMOSTASIS CONTROL  04/14/2022   Procedure: HEMOSTASIS CONTROL;  Surgeon: Lemar Lofty., MD;  Location: [facility];  Service: Gastroenterology;;   HEMOSTASIS CONTROL  10/13/2022   Procedure: HEMOSTASIS CONTROL;  Surgeon: Lemar Lofty., MD;  Location: [facility];  Service: Gastroenterology;;   HOT HEMOSTASIS N/A 09/02/2021   Procedure: HOT HEMOSTASIS (ARGON PLASMA COAGULATION/BICAP);  Surgeon: Lemar Lofty., MD;  Location: [facility] ENDOSCOPY;  Service: Gastroenterology;  Laterality: N/A;   HOT HEMOSTASIS N/A 04/14/2022   Procedure: HOT HEMOSTASIS (ARGON PLASMA COAGULATION/BICAP);  Surgeon: Lemar Lofty., MD;  Location: [facility];  Service: Gastroenterology;  Laterality: N/A;   LEFT HEART CATHETERIZATION WITH CORONARY ANGIOGRAM N/A 03/26/2014   Procedure: LEFT HEART CATHETERIZATION WITH CORONARY ANGIOGRAM;  Surgeon: Peter M Swaziland, MD;  Location: [facility] CATH LAB;  Service: Cardiovascular;  Laterality: N/A;   PERCUTANEOUS CORONARY STENT INTERVENTION (PCI-S) N/A 04/01/2014   Procedure: PERCUTANEOUS CORONARY STENT INTERVENTION (PCI-S);  Surgeon: Peter M Swaziland, MD;  Location: [facility] CATH LAB;  Service:  Cardiovascular;  Laterality: N/A;   POLYPECTOMY  10/13/2022   Procedure: POLYPECTOMY;  Surgeon: Meridee Score Netty Starring., MD;  Location: [facility];  Service: Gastroenterology;;   POSTERIOR LAMINECTOMY / DECOMPRESSION LUMBAR SPINE  12/25/2009   L4 , Dr Newell Coral   SCLEROTHERAPY  10/13/2022   Procedure: SCLEROTHERAPY;  Surgeon: Lemar Lofty., MD;  Location: [facility];  Service: Gastroenterology;;   SHOULDER ARTHROSCOPY WITH ROTATOR CUFF REPAIR AND OPEN BICEPS TENODESIS Left 02/04/2019   Procedure: LEFT SHOULDER ARTHROSCOPY, BICEPS TENODESIS, MINI OPEN ROTATOR CUFF TEAR REPAIR;  Surgeon: Cammy Copa, MD;  Location: [facility];  Service: Orthopedics;  Laterality: Left;   SUBMUCOSAL INJECTION  09/02/2021   Procedure: SUBMUCOSAL INJECTION;  Surgeon: Meridee Score Netty Starring., MD;  Location: [facility] ENDOSCOPY;  Service: Gastroenterology;;   SUBMUCOSAL LIFTING INJECTION  04/14/2022   Procedure: SUBMUCOSAL LIFTING INJECTION;  Surgeon: Lemar Lofty., MD;  Location: [facility];  Service: Gastroenterology;;   TOTAL KNEE ARTHROPLASTY Right 05/11/2021   Procedure: TOTAL KNEE ARTHROPLASTY;  Surgeon: Durene Romans, MD;  Location: [facility];  Service: Orthopedics;  Laterality: Right;    Social History   Socioeconomic History   Marital status: Single    Spouse name: Not on file   Number of children: 0   Years of education: 16   Highest education level: Bachelor's degree (e.g., BA, AB, BS)  Occupational History   Occupation: Retired   Tobacco Use   Smoking status:  Never   Smokeless tobacco: Never  Vaping Use   Vaping status: Never Used  Substance and Sexual Activity   Alcohol use: Not Currently    Comment: social   Drug use: No   Sexual activity: Not Currently  Other Topics Concern   Not on file  Social History Narrative   Fun: Golf and poker.    Social Determinants of Health   Financial Resource Strain: Low Risk  (12/11/2022)   Overall Financial Resource  Strain (CARDIA)    Difficulty of Paying Living Expenses: Not hard at all  Food Insecurity: No Food Insecurity (01/05/2023)   Hunger Vital Sign    Worried About Running Out of Food in the Last Year: Never true    Ran Out of Food in the Last Year: Never true  Transportation Needs: No Transportation Needs (01/05/2023)   PRAPARE - Administrator, Civil Service (Medical): No    Lack of Transportation (Non-Medical): No  Physical Activity: Sufficiently Active (12/11/2022)   Exercise Vital Sign    Days of Exercise per Week: 4 days    Minutes of Exercise per Session: 60 min  Stress: No Stress Concern Present (12/11/2022)   [employer] of Occupational Health - Occupational Stress Questionnaire    Feeling of Stress : Not at all  Social Connections: Socially Isolated (12/11/2022)   Social Connection and Isolation Panel [NHANES]    Frequency of Communication with Friends and Family: More than three times a week    Frequency of Social Gatherings with Friends and Family: Twice a week    Attends Religious Services: Never    Database administrator or Organizations: No    Attends Banker Meetings: Never    Marital Status: Never married  Intimate Partner Violence: Not At Risk (01/05/2023)   Humiliation, Afraid, Rape, and Kick questionnaire    Fear of Current or Ex-Partner: No    Emotionally Abused: No    Physically Abused: No    Sexually Abused: No    Family History  Problem Relation Age of Onset   Heart attack Mother 30   Stroke Mother 35   Prostate cancer Father    Diabetes Father    Heart disease Father 33       CBAG    Heart disease Sister    Diabetes Brother    Heart attack Brother    Breast cancer Maternal Aunt    Heart attack Maternal Grandfather 41   Diabetes Paternal Grandmother    Stroke Paternal Grandfather 8   Stomach cancer Neg Hx    Rectal cancer Neg Hx    Esophageal cancer Neg Hx    Colon cancer Neg Hx    Inflammatory bowel disease Neg Hx     Liver disease Neg Hx    Pancreatic cancer Neg Hx     Current Facility-Administered Medications  Medication Dose Route Frequency Provider Last Rate Last Admin   0.9 %  sodium chloride infusion  250 mL Intravenous PRN Karie Soda, MD       acetaminophen (TYLENOL) tablet 1,000 mg  1,000 mg Oral Q6H Karie Soda, MD   1,000 mg at 01/11/23 0157   alum & mag hydroxide-simeth (MAALOX/MYLANTA) 200-200-20 MG/5ML suspension 30 mL  30 mL Oral Q6H PRN Karie Soda, MD       atorvastatin (LIPITOR) tablet 80 mg  80 mg Oral QPM Karie Soda, MD   80 mg at 01/10/23 1709   carvedilol (COREG) tablet 3.125 mg  3.125 mg  Oral BID WC Karie Soda, MD   3.125 mg at 01/10/23 1709   cyanocobalamin (VITAMIN B12) tablet 1,000 mcg  1,000 mcg Oral Daily Karie Soda, MD   1,000 mcg at 01/10/23 0945   diphenhydrAMINE (BENADRYL) 12.5 MG/5ML elixir 12.5 mg  12.5 mg Oral Q6H PRN Karie Soda, MD       Or   diphenhydrAMINE (BENADRYL) injection 12.5 mg  12.5 mg Intravenous Q6H PRN Karie Soda, MD       enalaprilat (VASOTEC) injection 0.625-1.25 mg  0.625-1.25 mg Intravenous Q6H PRN Karie Soda, MD       enoxaparin (LOVENOX) injection 40 mg  40 mg Subcutaneous Q24H Karie Soda, MD   40 mg at 01/10/23 0843   feeding supplement (GLUCERNA SHAKE) (GLUCERNA SHAKE) liquid 237 mL  237 mL Oral BID BM Karie Soda, MD   237 mL at 01/10/23 1426   hydrALAZINE (APRESOLINE) injection 10 mg  10 mg Intravenous Q2H PRN Karie Soda, MD       HYDROmorphone (DILAUDID) injection 0.5-2 mg  0.5-2 mg Intravenous Q4H PRN Karie Soda, MD   1 mg at 01/06/23 2121   insulin aspart (novoLOG) injection 0-15 Units  0-15 Units Subcutaneous TID WC Karie Soda, MD   3 Units at 01/10/23 1146   insulin aspart (novoLOG) injection 0-5 Units  0-5 Units Subcutaneous Laurena Slimmer, MD   2 Units at 01/05/23 2225   lactated ringers bolus 1,000 mL  1,000 mL Intravenous Q8H PRN Karie Soda, MD       magic mouthwash  15 mL Oral QID PRN Karie Soda, MD       melatonin tablet 3 mg  3 mg Oral QHS PRN Karie Soda, MD   3 mg at 01/08/23 0222   menthol-cetylpyridinium (CEPACOL) lozenge 3 mg  1 lozenge Oral PRN Karie Soda, MD       metFORMIN (GLUCOPHAGE-XR) 24 hr tablet 500 mg  500 mg Oral Q supper Karie Soda, MD   500 mg at 01/10/23 1709   methocarbamol (ROBAXIN) 1,000 mg in dextrose 5 % 100 mL IVPB  1,000 mg Intravenous Q6H PRN Karie Soda, MD       methocarbamol (ROBAXIN) tablet 500 mg  500 mg Oral Q6H PRN Karie Soda, MD   500 mg at 01/11/23 0157   metoprolol tartrate (LOPRESSOR) injection 5 mg  5 mg Intravenous Q6H PRN Karie Soda, MD       multivitamin with minerals tablet 1 tablet  1 tablet Oral Daily Karie Soda, MD   1 tablet at 01/10/23 0944   nitroGLYCERIN (NITROSTAT) SL tablet 0.4 mg  0.4 mg Sublingual Q5 min PRN Karie Soda, MD       ondansetron [facility]) tablet 4 mg  4 mg Oral Q6H PRN Karie Soda, MD       Or   ondansetron [facility]) injection 4 mg  4 mg Intravenous Q6H PRN Karie Soda, MD   4 mg at 01/06/23 1523   pantoprazole (PROTONIX) EC tablet 40 mg  40 mg Oral Daily Karie Soda, MD   40 mg at 01/10/23 0945   phenol (CHLORASEPTIC) mouth spray 2 spray  2 spray Mouth/Throat PRN Karie Soda, MD       polycarbophil (FIBERCON) tablet 625 mg  625 mg Oral BID Karie Soda, MD   625 mg at 01/10/23 2110   polyethylene glycol (MIRALAX / GLYCOLAX) packet 17 g  17 g Oral Daily Cornett, Maisie Fus, MD   17 g at 01/10/23 1043   polyvinyl alcohol (LIQUIFILM  TEARS) 1.4 % ophthalmic solution 1 drop  1 drop Both Eyes Daily PRN Karie Soda, MD       potassium chloride SA (KLOR-CON M) CR tablet 40 mEq  40 mEq Oral BID Karie Soda, MD   40 mEq at 01/10/23 2110   prochlorperazine (COMPAZINE) tablet 10 mg  10 mg Oral Q6H PRN Karie Soda, MD       Or   prochlorperazine (COMPAZINE) injection 5-10 mg  5-10 mg Intravenous Q6H PRN Karie Soda, MD       simethicone (MYLICON) chewable tablet 80 mg  80 mg Oral QID Karie Soda, MD   80 mg at 01/10/23 1044   sodium chloride flush (NS) 0.9 % injection 3 mL  3 mL Intravenous Catha Gosselin, MD   3 mL at 01/10/23 2113   sodium chloride flush (NS) 0.9 % injection 3 mL  3 mL Intravenous PRN Karie Soda, MD       spironolactone (ALDACTONE) tablet 25 mg  25 mg Oral Daily Karie Soda, MD   25 mg at 01/10/23 0945   traMADol (ULTRAM) tablet 50-100 mg  50-100 mg Oral Q6H PRN Karie Soda, MD   100 mg at 01/10/23 2110     Allergies  Allergen Reactions   Aspirin Hives, Swelling and Other (See Comments)    Angioedema   Oxycodone Nausea Only   Contrast Media [Iodinated Contrast Media] Hives, Itching, Rash and Other (See Comments)    Developed hives despite pre-treatment   Norvasc [Amlodipine Besylate] Other (See Comments) and Cough    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, [address] [place], [place] [address] [phone] Fax [phone] Main  CONTACT INFORMATION:  Weekday (9AM-5PM): Call [facility] main office at [phone]  Weeknight (5PM-9AM) or Weekend/Holiday: Check [email or web address] (password " [email or web address]") for General Surgery [facility] coverage  (Please, do not use SecureChat as it is not reliable communication to reach operating surgeons for immediate patient care given surgeries/outpatient duties/clinic/cross-coverage/off post-call which would lead to a delay in care.  Epic staff messaging available for outptient concerns, but may not be answered for 48 hours or more).     01/11/2023, 8:08 AM

## 2023-01-11 NOTE — Progress Notes (Signed)
AVS  reviewed w/ patient and his brother. Patient verbalized an understanding. No other questions at this time.Belongings sent home with patient. Pt & brother walkied to exit by  this RN.

## 2023-01-11 NOTE — Progress Notes (Signed)
Both PIVs removed as documented. Pt tol PO diet well. Pt will shower and get dressed, AVS will be reviewed w/ pt after shower. Pt's borther will be here at 11 am - pt is appropriate for the d/c lounge.

## 2023-01-11 NOTE — Progress Notes (Signed)
Patient in shower 

## 2023-01-12 ENCOUNTER — Ambulatory Visit: Payer: Self-pay

## 2023-01-12 ENCOUNTER — Telehealth: Payer: Self-pay

## 2023-01-12 NOTE — Transitions of Care (Post Inpatient/ED Visit) (Signed)
01/12/2023  Name: [name] [name] MRN: [id number] DOB: [birth year]  Today's TOC FU Call Status: Today's TOC FU Call Status:: Successful TOC FU Call Competed TOC FU Call Complete Date: 01/12/23 (Incoming call from pt returning RN CM call.)  Transition Care Management Follow-up Telephone Call Date of Discharge: 01/11/23 Discharge Facility: [facility]) Type of Discharge: Inpatient Admission Primary Inpatient Discharge Diagnosis:: "tubular adenoma of colon" How have you been since you were released from the hospital?: Better (Pt states he is doing good-slept 12hrs last night. Pain 3/10-took pain med last night. Appetite good-having BM  daily.He has been walking several times per day.) Any questions or concerns?: Yes Patient Questions/Concerns:: Pt states he has already spoken to nurse at surgeon office regarding a small dime size blister on his abd-not at incision site. It is not painful and still intact.Nurse instructed him to monitor it-possiblly caused from surgical tape removal. He is doing as advised. Patient Questions/Concerns Addressed: Other: (Reinforced post surgical care instructions as noted on d/c instructions-advised to follow up with surgeon if sxs worsen and/or unresolved)  Items Reviewed: Did you receive and understand the discharge instructions provided?: Yes Medications obtained,verified, and reconciled?: Yes (Medications Reviewed) Any new allergies since your discharge?: No Dietary orders reviewed?: Yes Type of Diet Ordered:: low salt/heart healthy/carb modified Do you have support at home?: Yes People in Home: alone, sibling(s) Name of Support/Comfort Primary Source: brother lives nearby and able to assist as needed  Medications Reviewed Today: Medications Reviewed Today     Reviewed by Charlyn Minerva, RN (Registered Nurse) on 01/12/23 at 1421  Med List Status: <None>   Medication Order Taking? Sig Documenting Provider Last Dose Status Informant   Accu-Chek Softclix Lancets lancets [id number] Yes 1 each by Other route 3 (three) times daily. Ardith Dark, MD Taking Active Self  atorvastatin (LIPITOR) 80 MG tablet [id number] Yes TAKE 1 TABLET DAILY AT     6:00PM Ardith Dark, MD Taking Active Self  Blood Glucose Monitoring Suppl (ACCU-CHEK AVIVA PLUS) w/Device KIT [id number] Yes Check glucose TID or as needed E11.9 Ardith Dark, MD Taking Active Self  carvedilol (COREG) 3.125 MG tablet [id number] Yes TAKE 1 TABLET TWICE DAILY  WITH MEALS Ardith Dark, MD Taking Active Self  clopidogrel (PLAVIX) 75 MG tablet [id number] Yes TAKE 1 TABLET DAILY Ardith Dark, MD Taking Active Self  Coenzyme Q10 (COQ10) 100 MG CAPS [id number] Yes Take 200 mg by mouth daily. [provider] Taking Active Self  ferrous sulfate 325 (65 FE) MG tablet [id number] Yes Take 325 mg by mouth daily. [provider] Taking Active Self  FIBER PO [id number] Yes Take 3 capsules by mouth daily. [provider] Taking Active Self  glucose blood test strip [id number] Yes Use as instructed Ardith Dark, MD Taking Active Self  glucose blood test strip [id number]  Use as instructed Ardith Dark, MD  Active Self  hydrochlorothiazide (HYDRODIURIL) 25 MG tablet [id number] Yes Take 1 tablet (25 mg total) by mouth daily. Swazil[place], Peter M, MD Taking Active Self  irbesartan (AVAPRO) 300 MG tablet [id number] Yes TAKE 1 TABLET DAILY Ardith Dark, MD Taking Active Self  metFORMIN (GLUCOPHAGE-XR) 500 MG 24 hr tablet [id number] Yes TAKE 2 TABLETS DAILY AFTER SUPPER Ardith Dark, MD Taking Active Self  Multiple Vitamins-Minerals (MULTIVITAMIN WITH MINERALS) tablet [id number] Yes Take 1 tablet by mouth daily. [provider] Taking Active Self  nitroGLYCERIN (NITROSTAT) 0.4 MG SL tablet [id number]  Place 1 tablet (  0.4 mg total) under the tongue every 5 (five) minutes as needed for chest pain. Swazil[place], Peter M, MD  Active Self           Med Note Sherrie Mustache,  [place]   Tue Dec 20, 2022 10:16 AM) Expired, needs to get new bottle  pantoprazole (PROTONIX) 40 MG tablet [id number] Yes TAKE 1 TABLET TWICE DAILY  BEFORE MEALS  Patient taking differently: Take 40 mg by mouth daily.   Zehr, Princella Pellegrini, PA-C Taking Active Self  Propylene Glycol, PF, (SYSTANE COMPLETE PF) 0.6 % SOLN [id number] Yes Place 1 drop into both eyes daily as needed (dry eyes). [provider] Taking Active Self  sildenafil (REVATIO) 20 MG tablet [id number] Yes Take 1-5 tablets by mouth daily as needed. Ardith Dark, MD Taking Active Self  spironolactone (ALDACTONE) 25 MG tablet [id number] Yes TAKE 1 TABLET DAILY Ardith Dark, MD Taking Active Self  traMADol (ULTRAM) 50 MG tablet [id number] Yes Take 1-2 tablets (50-100 mg total) by mouth every 6 (six) hours as needed for moderate pain or severe pain. Karie Soda, MD Taking Active   Undecylenic Ac-Zn Undecylenate (FUNGI-NAIL TOE & FOOT EX) [id number] Yes Apply 1 application  topically daily as needed (fungus). [provider] Taking Active Self  vitamin B-12 (CYANOCOBALAMIN) 1000 MCG tablet [id number] Yes Take 1,000 mcg by mouth daily. [provider] Taking Active Self  Whey Protein POWD [id number] Yes Take 2 Scoops by mouth daily. [provider] Taking Active Self            Home Care and Equipment/Supplies: Were Home Health Services Ordered?: NA Any new equipment or medical supplies ordered?: NA  Functional Questionnaire: Do you need assistance with bathing/showering or dressing?: No Do you need assistance with meal preparation?: No Do you need assistance with eating?: No Do you have difficulty maintaining continence: No Do you need assistance with getting out of bed/getting out of a chair/moving?: No Do you have difficulty managing or taking your medications?: No  Follow up appointments reviewed: PCP Follow-up appointment confirmed?: Yes Date of PCP follow-up appointment?: 02/06/23 (pt  requested to make PCP after he has seen surgeon for follow up-care guide assisted with request) Follow-up Provider: Dr. Jimmey Ralph [facility] Follow-up appointment confirmed?: Yes Date of Specialist follow-up appointment?: 02/02/23 Follow-Up Specialty Provider:: Dr. Anne Fu Do you need transportation to your follow-up appointment?: No (pt confirms his brother can take him to appts until he is able to resume driving) Do you understand care options if your condition(s) worsen?: Yes-patient verbalized understanding  SDOH Interventions Today    Flowsheet Row Most Recent Value  SDOH Interventions   Food Insecurity Interventions Intervention Not Indicated  Transportation Interventions Intervention Not Indicated      TOC Interventions Today    Flowsheet Row Most Recent Value  TOC Interventions   TOC Interventions Discussed/Reviewed TOC Interventions Discussed, Post op wound/incision care, Post discharge activity limitations per provider, S/S of infection, Arranged PCP follow up less than 12 days/Care Guide scheduled      Interventions Today    Flowsheet Row Most Recent Value  General Interventions   General Interventions Discussed/Reviewed General Interventions Discussed, Doctor Visits, Durable Medical Equipment (DME)  Doctor Visits Discussed/Reviewed Doctor Visits Discussed, PCP, Specialist  Durable Medical Equipment (DME) Glucomoter  PCP/Specialist Visits Compliance with follow-up visit  Education Interventions   Education Provided Provided Education  Provided Verbal Education On Nutrition, When to see the doctor, Medication, Other  [pain mgmt, bowel mgmt]  Nutrition Interventions  Nutrition Discussed/Reviewed Nutrition Discussed, Adding fruits and vegetables, Decreasing salt, Decreasing fats, Increasing proteins, Decreasing sugar intake, Fluid intake  Pharmacy Interventions   Pharmacy Dicussed/Reviewed Pharmacy Topics Discussed, Medications and their functions  Safety  Interventions   Safety Discussed/Reviewed Safety Discussed       Alessandra Grout [facility] Care Management Care Management Community Coordinator Direct Phone: [phone] Toll Free: [phone] Fax: [phone]

## 2023-01-12 NOTE — Chronic Care Management (AMB) (Signed)
   01/12/2023  [name] [name] Aug 14, 1950 [id number]   Reason for Encounter: Patient is not currently enrolled in the CCM program. CCM enrollment status changed to Previously enrolled.   [name]/Chronic Care Management [phone]

## 2023-01-12 NOTE — Transitions of Care (Post Inpatient/ED Visit) (Signed)
   01/12/2023  Name: [name] [name] MRN: [id number] DOB: [birth year]  Today's TOC FU Call Status: Today's TOC FU Call Status:: Unsuccessul Call (1st Attempt) Unsuccessful Call (1st Attempt) Date: 01/12/23  Attempted to reach the patient regarding the most recent Inpatient/ED visit.  Follow Up Plan: Additional outreach attempts will be made to reach the patient to complete the Transitions of Care (Post Inpatient/ED visit) call.      Antionette Fairy, RN,BSN,CCM [facility] Care Management Care Management Community Coordinator Direct Phone: [phone] Toll Free: [phone] Fax: [phone]

## 2023-02-06 ENCOUNTER — Ambulatory Visit: Payer: Medicare Other | Admitting: Family Medicine

## 2023-02-06 ENCOUNTER — Encounter: Payer: Self-pay | Admitting: Family Medicine

## 2023-02-06 VITALS — BP 109/73 | HR 56 | Temp 98.0°F | Ht 68.5 in | Wt 170.2 lb

## 2023-02-06 DIAGNOSIS — E1169 Type 2 diabetes mellitus with other specified complication: Secondary | ICD-10-CM | POA: Diagnosis not present

## 2023-02-06 DIAGNOSIS — E785 Hyperlipidemia, unspecified: Secondary | ICD-10-CM

## 2023-02-06 DIAGNOSIS — E1165 Type 2 diabetes mellitus with hyperglycemia: Secondary | ICD-10-CM

## 2023-02-06 DIAGNOSIS — D122 Benign neoplasm of ascending colon: Secondary | ICD-10-CM | POA: Diagnosis not present

## 2023-02-06 DIAGNOSIS — Z7984 Long term (current) use of oral hypoglycemic drugs: Secondary | ICD-10-CM

## 2023-02-06 DIAGNOSIS — E1159 Type 2 diabetes mellitus with other circulatory complications: Secondary | ICD-10-CM | POA: Diagnosis not present

## 2023-02-06 DIAGNOSIS — K2100 Gastro-esophageal reflux disease with esophagitis, without bleeding: Secondary | ICD-10-CM

## 2023-02-06 DIAGNOSIS — I152 Hypertension secondary to endocrine disorders: Secondary | ICD-10-CM | POA: Diagnosis not present

## 2023-02-06 NOTE — Assessment & Plan Note (Signed)
Blood pressure on lower side today.  Occasionally getting dizziness with standing.  We will stop his spironolactone.  Continue HCTZ 25 mg daily, Coreg 3.125 mg twice daily, and irbesartan 300 mg daily.  He will continue to monitor at home.  He will let us know if he still has symptomatic lows or if his blood pressure is persistently elevated to 140/90 or higher.

## 2023-02-06 NOTE — Patient Instructions (Addendum)
It was very nice to see you today!  I am glad that you are doing well.  Please stop your spironolactone.  Keep an eye on your blood pressure at home and let us know if it is persistently elevated.  We will see you back in 6 months.  Please come back to see [place] sooner if needed.  Return in about 6 months (around 08/09/2023) for Annual Physical.   Take care, Dr Jimmey Ralph  PLEASE NOTE:  If you had any lab tests, please let us know if you have not heard back within a few days. You may see your results on mychart before we have a chance to review them but we will give you a call once they are reviewed by [place].   If we ordered any referrals today, please let us know if you have not heard from their office within the next week.   If you had any urgent prescriptions sent in today, please check with the pharmacy within an hour of our visit to make sure the prescription was transmitted appropriately.   Please try these tips to maintain a healthy lifestyle:  Eat at least 3 REAL meals and 1-2 snacks per day.  Aim for no more than 5 hours between eating.  If you eat breakfast, please do so within one hour of getting up.   Each meal should contain half fruits/vegetables, one quarter protein, and one quarter carbs (no bigger than a computer mouse)  Cut down on sweet beverages. This includes juice, soda, and sweet tea.   Drink at least 1 glass of water with each meal and aim for at least 8 glasses per day  Exercise at least 150 minutes every week.

## 2023-02-06 NOTE — Assessment & Plan Note (Signed)
Last A1c 7.2.  Too early to recheck today.  Continue metformin 1000 mg daily.  Recheck in 3 to 6 months.  May consider increasing dose of metformin versus addition of Synjardy in the future if not at goal.  He would like to avoid due to pain bananas for now.

## 2023-02-06 NOTE — Assessment & Plan Note (Signed)
Doing well status post colectomy.  Pain is manageable.  Bowel movements are improving.

## 2023-02-06 NOTE — Assessment & Plan Note (Signed)
Stable on Lipitor 80 mg daily.  Check lipids.

## 2023-02-06 NOTE — Progress Notes (Signed)
   [name] [name] is a 72 y.o. male who presents today for an office visit.  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  Pain is manageable.  Bowel movements are improving.  GERD On Protonix 40 mg daily.  He did ask about discontinuing.  We discussed risk and benefits.  He will continue this for now.  We did discuss importance on being on some sort of acid blocker medication to prevent development of Barrett's esophagus or esophageal cancer.  He will discuss further with GI as well.  Hyperlipidemia associated with type 2 diabetes mellitus (HCC) Stable on Lipitor 80 mg daily.  Check lipids.  Hypertension associated with diabetes (HCC) Blood pressure on lower side today.  Occasionally getting dizziness with standing.  We will stop his spironolactone.  Continue HCTZ 25 mg daily, Coreg 3.125 mg twice daily, and irbesartan 300 mg daily.  He will continue to monitor at home.  He will let us know if he still has symptomatic lows or if his blood pressure is persistently elevated to 140/90 or higher.  T2DM (type 2 diabetes mellitus) (HCC) Last A1c 7.2.  Too early to recheck today.  Continue metformin 1000 mg daily.  Recheck in 3 to 6 months.  May consider increasing dose of metformin versus addition of Synjardy in the future if not at goal.  He would like to avoid due to pain bananas for now.     Subjective:  HPI:  See A/P for status of chronic conditions.  Patient is here today for follow-up.  Underwent robotic colectomy with hernia repair due to tubular adenoma of colon on 01/05/2023. He has done well post operatively. Pain is well controlled. Bowel movements have been manageable. HE has having some loose stools. He has started taking fiber supplementation. No fevers or chills.       Objective:  Physical Exam: BP 109/73   Pulse (!) 56   Temp 98 F (36.7 C) (Temporal)   Ht 5' 8.5" (1.74 m)    Wt 170 lb 3.2 oz (77.2 kg)   SpO2 100%   BMI 25.50 kg/m   Gen: No acute distress, resting comfortably CV: Regular rate and rhythm with no murmurs appreciated Pulm: Normal work of breathing, clear to auscultation bilaterally with no crackles, wheezes, or rhonchi Neuro: Grossly normal, moves all extremities Psych: Normal affect and thought content  Time Spent: 45 minutes of total time was spent on the date of the encounter performing the following actions: chart review prior to seeing the patient including recent visits with specialists, obtaining history, performing a medically necessary exam, counseling on the treatment plan, placing orders, and documenting in our EHR.        Katina Degree. Jimmey Ralph, MD 02/06/2023 10:24 AM

## 2023-02-06 NOTE — Assessment & Plan Note (Signed)
On Protonix 40 mg daily.  He did ask about discontinuing.  We discussed risk and benefits.  He will continue this for now.  We did discuss importance on being on some sort of acid blocker medication to prevent development of Barrett's esophagus or esophageal cancer.  He will discuss further with GI as well.

## 2023-02-21 ENCOUNTER — Other Ambulatory Visit: Payer: Self-pay | Admitting: Family Medicine

## 2023-03-16 DIAGNOSIS — Z23 Encounter for immunization: Secondary | ICD-10-CM | POA: Diagnosis not present

## 2023-04-06 ENCOUNTER — Ambulatory Visit: Payer: Medicare Other

## 2023-04-06 VITALS — Wt 170.0 lb

## 2023-04-06 DIAGNOSIS — Z0000 Encounter for general adult medical examination without abnormal findings: Secondary | ICD-10-CM

## 2023-04-06 NOTE — Progress Notes (Signed)
Subjective:   [name] [name] is a 72 y.o. male who presents for Medicare Annual/Subsequent preventive examination.  Visit Complete: Virtual I connected with  [name] [name] on 04/06/23 by a audio enabled telemedicine application and verified that I am speaking with the correct person using two identifiers.  Patient Location: Home  Provider Location: Office/Clinic  I discussed the limitations of evaluation and management by telemedicine. The patient expressed understanding and agreed to proceed.  Vital Signs: Because this visit was a virtual/telehealth visit, some criteria may be missing or patient reported. Any vitals not documented were not able to be obtained and vitals that have been documented are patient reported.  Patient Medicare AWV questionnaire was completed by the patient on 04/03/23; I have confirmed that all information answered by patient is correct and no changes since this date.  Cardiac Risk Factors include: advanced age (>45men, >107 women);diabetes mellitus;dyslipidemia;hypertension;male genderBecause this visit was a virtual/telehealth visit, some criteria may be missing or patient reported. Any vitals not documented were not able to be obtained and vitals that have been documented are patient reported.       Objective:    Today's Vitals   04/06/23 0946  Weight: 170 lb (77.1 kg)   Body mass index is 25.47 kg/m.     04/06/2023    9:51 AM 01/05/2023    8:44 AM 12/26/2022    8:49 AM 10/13/2022   11:58 AM 06/06/2022    1:24 PM 04/14/2022    9:20 AM 03/24/2022    8:19 AM  Advanced Directives  Does Patient Have a Medical Advance Directive? Yes Yes Yes Yes Yes Yes Yes  Type of Estate agent of [place];Living will Healthcare Power of [place];Living will Healthcare Power of [place];Living will Healthcare Power of [employer] of [place];Living will Healthcare Power of [place];Living will  Does patient want to make changes to  medical advance directive? No - Patient declined No - Patient declined   No - Patient declined  No - Patient declined  Copy of Healthcare Power of Attorney in Chart? Yes - validated most recent copy scanned in chart (See row information) No - copy requested No - copy requested   No - copy requested Yes - validated most recent copy scanned in chart (See row information)    Current Medications (verified) Outpatient Encounter Medications as of 04/06/2023  Medication Sig   Accu-Chek Softclix Lancets lancets 1 each by Other route 3 (three) times daily.   atorvastatin (LIPITOR) 80 MG tablet TAKE 1 TABLET DAILY AT     6:00PM   Blood Glucose Monitoring Suppl (ACCU-CHEK AVIVA PLUS) w/Device KIT Check glucose TID or as needed E11.9   carvedilol (COREG) 3.125 MG tablet TAKE 1 TABLET TWICE DAILY  WITH MEALS   clopidogrel (PLAVIX) 75 MG tablet TAKE 1 TABLET DAILY   Coenzyme Q10 (COQ10) 100 MG CAPS Take 200 mg by mouth daily.   ferrous sulfate 325 (65 FE) MG tablet Take 325 mg by mouth daily.   FIBER PO Take 3 capsules by mouth daily.   glucose blood test strip Use as instructed   glucose blood test strip Use as instructed   hydrochlorothiazide (HYDRODIURIL) 25 MG tablet Take 1 tablet (25 mg total) by mouth daily.   irbesartan (AVAPRO) 300 MG tablet TAKE 1 TABLET DAILY   metFORMIN (GLUCOPHAGE-XR) 500 MG 24 hr tablet TAKE 2 TABLETS DAILY AFTER SUPPER   Multiple Vitamins-Minerals (MULTIVITAMIN WITH MINERALS) tablet Take 1 tablet by mouth daily.  nitroGLYCERIN (NITROSTAT) 0.4 MG SL tablet Place 1 tablet (0.4 mg total) under the tongue every 5 (five) minutes as needed for chest pain.   pantoprazole (PROTONIX) 40 MG tablet TAKE 1 TABLET TWICE DAILY  BEFORE MEALS (Patient taking differently: 40 mg daily.)   Propylene Glycol, PF, (SYSTANE COMPLETE PF) 0.6 % SOLN Place 1 drop into both eyes daily as needed (dry eyes).   sildenafil (REVATIO) 20 MG tablet Take 1-5 tablets by mouth daily as needed.   Undecylenic  Ac-Zn Undecylenate (FUNGI-NAIL TOE & FOOT EX) Apply 1 application  topically daily as needed (fungus).   vitamin B-12 (CYANOCOBALAMIN) 1000 MCG tablet Take 1,000 mcg by mouth daily.   Whey Protein POWD Take 2 Scoops by mouth daily.   [DISCONTINUED] Accu-Chek Softclix Lancets lancets 1 each by Other route 3 (three) times daily.   [DISCONTINUED] atorvastatin (LIPITOR) 80 MG tablet TAKE 1 TABLET DAILY AT     6:00PM   [DISCONTINUED] carvedilol (COREG) 3.125 MG tablet TAKE 1 TABLET TWICE DAILY  WITH MEALS   [DISCONTINUED] clopidogrel (PLAVIX) 75 MG tablet TAKE 1 TABLET DAILY   [DISCONTINUED] Coenzyme Q10 200 MG capsule Take 200 mg by mouth daily. gummie   [DISCONTINUED] famotidine-calcium carbonate-magnesium hydroxide (PEPCID COMPLETE) 10-800-165 MG chewable tablet Chew 0.5 tablets by mouth every 6 (six) hours as needed (heartburn).   [DISCONTINUED] ferrous sulfate 325 (65 FE) MG tablet Take 325 mg by mouth daily. (Patient not taking: Reported on 09/14/2022)   [DISCONTINUED] irbesartan (AVAPRO) 300 MG tablet TAKE 1 TABLET DAILY   [DISCONTINUED] sildenafil (REVATIO) 20 MG tablet Take 1-5 tablets by mouth daily as needed.   [DISCONTINUED] sodium chloride (OCEAN) 0.65 % SOLN nasal spray Place 1 spray into both nostrils as needed for congestion.   [DISCONTINUED] spironolactone (ALDACTONE) 25 MG tablet TAKE 1 TABLET DAILY   No facility-administered encounter medications on file as of 04/06/2023.    Allergies (verified) Aspirin, Oxycodone, Contrast media [iodinated contrast media], and Norvasc [amlodipine besylate]   History: Past Medical History:  Diagnosis Date   Adenomatous colon polyp 09/07/2004   Amblyopia    Anemia    Arthritis    CAD (coronary artery disease)    cardiac CT with a 50% LAD lesion in 2007   Diverticulosis    DM2 (diabetes mellitus, type 2) (HCC)    Elevated LFTs    secondary to labetalol   Esophageal stricture 2003   External hemorrhoids 2012   lanced by Dr Zachery Dakins    Family history of anesthesia complication    "sister had PONV"   GERD (gastroesophageal reflux disease)    esophageal stricture, pmh   Hiatal hernia 01/04/2000   History of kidney stones 2022   HLD (hyperlipidemia)    HTN (hypertension)    Hypopotassemia    Past Surgical History:  Procedure Laterality Date   BIOPSY  06/05/2021   Procedure: BIOPSY;  Surgeon: Iva Boop, MD;  Location: [facility] ENDOSCOPY;  Service: Endoscopy;;   BIOPSY  09/02/2021   Procedure: BIOPSY;  Surgeon: Lemar Lofty., MD;  Location[facility] ENDOSCOPY;  Service: Gastroenterology;;   COLONOSCOPY  06/27/2004   tics , ADENOMATOUS polyps. F/U due 2011   COLONOSCOPY  06/27/2010   Tics   COLONOSCOPY WITH PROPOFOL N/A 09/02/2021   Procedure: COLONOSCOPY WITH PROPOFOL;  Surgeon: Meridee Score Netty Starring., MD;  Location: [facility] ENDOSCOPY;  Service: Gastroenterology;  Laterality: N/A;   COLONOSCOPY WITH PROPOFOL N/A 04/14/2022   Procedure: COLONOSCOPY WITH PROPOFOL;  Surgeon: Meridee Score Netty Starring., MD;  Location: [facility]  ENDOSCOPY;  Service: Gastroenterology;  Laterality: N/A;   COLONOSCOPY WITH PROPOFOL N/A 10/13/2022   Procedure: COLONOSCOPY WITH PROPOFOL;  Surgeon: Meridee Score Netty Starring., MD;  Location: [facility] ENDOSCOPY;  Service: Gastroenterology;  Laterality: N/A;   CORONARY ANGIOPLASTY WITH STENT PLACEMENT  04/01/2014   "2"   ENDOSCOPIC MUCOSAL RESECTION  09/02/2021   Procedure: ENDOSCOPIC MUCOSAL RESECTION;  Surgeon: Meridee Score Netty Starring., MD;  Location: [facility] ENDOSCOPY;  Service: Gastroenterology;;   ENDOSCOPIC MUCOSAL RESECTION N/A 04/14/2022   Procedure: ENDOSCOPIC MUCOSAL RESECTION;  Surgeon: Lemar Lofty., MD;  Location: [facility] ENDOSCOPY;  Service: Gastroenterology;  Laterality: N/A;   ESOPHAGEAL BRUSHING  04/14/2022   Procedure: ESOPHAGEAL BRUSHING;  Surgeon: Lemar Lofty., MD;  Location: [facility] ENDOSCOPY;  Service: Gastroenterology;;   ESOPHAGEAL DILATION  06/27/2001   ESOPHAGOGASTRODUODENOSCOPY  06/27/2001    ERD, stricture dilated   ESOPHAGOGASTRODUODENOSCOPY (EGD) WITH PROPOFOL N/A 06/05/2021   Procedure: ESOPHAGOGASTRODUODENOSCOPY (EGD) WITH PROPOFOL;  Surgeon: Iva Boop, MD;  Location: [facility] ENDOSCOPY;  Service: Endoscopy;  Laterality: N/A;   ESOPHAGOGASTRODUODENOSCOPY (EGD) WITH PROPOFOL N/A 09/02/2021   Procedure: ESOPHAGOGASTRODUODENOSCOPY (EGD) WITH PROPOFOL;  Surgeon: Meridee Score Netty Starring., MD;  Location: [facility] ENDOSCOPY;  Service: Gastroenterology;  Laterality: N/A;   ESOPHAGOGASTRODUODENOSCOPY (EGD) WITH PROPOFOL N/A 04/14/2022   Procedure: ESOPHAGOGASTRODUODENOSCOPY (EGD) WITH PROPOFOL;  Surgeon: Meridee Score Netty Starring., MD;  Location: [facility] ENDOSCOPY;  Service: Gastroenterology;  Laterality: N/A;   EXCISIONAL HEMORRHOIDECTOMY  ~ 2008   FULL THICKNESS RESECTION (FTRD) N/A 10/13/2022   Procedure: FULL THICKNESS RESECTION (FTRD);  Surgeon: Lemar Lofty., MD;  Location: [facility] ENDOSCOPY;  Service: Gastroenterology;  Laterality: N/A;   HEMOSTASIS CLIP PLACEMENT  09/02/2021   Procedure: HEMOSTASIS CLIP PLACEMENT;  Surgeon: Lemar Lofty., MD;  Location: [facility] ENDOSCOPY;  Service: Gastroenterology;;   HEMOSTASIS CLIP PLACEMENT  10/13/2022   Procedure: HEMOSTASIS CLIP PLACEMENT;  Surgeon: Lemar Lofty., MD;  Location: [facility] ENDOSCOPY;  Service: Gastroenterology;;   HEMOSTASIS CONTROL  04/14/2022   Procedure: HEMOSTASIS CONTROL;  Surgeon: Lemar Lofty., MD;  Location: [facility] ENDOSCOPY;  Service: Gastroenterology;;   HEMOSTASIS CONTROL  10/13/2022   Procedure: HEMOSTASIS CONTROL;  Surgeon: Lemar Lofty., MD;  Location: [facility] ENDOSCOPY;  Service: Gastroenterology;;   HOT HEMOSTASIS N/A 09/02/2021   Procedure: HOT HEMOSTASIS (ARGON PLASMA COAGULATION/BICAP);  Surgeon: Lemar Lofty., MD;  Location: [facility] ENDOSCOPY;  Service: Gastroenterology;  Laterality: N/A;   HOT HEMOSTASIS N/A 04/14/2022   Procedure: HOT HEMOSTASIS (ARGON PLASMA COAGULATION/BICAP);  Surgeon:  Lemar Lofty., MD;  Location: [facility] ENDOSCOPY;  Service: Gastroenterology;  Laterality: N/A;   LEFT HEART CATHETERIZATION WITH CORONARY ANGIOGRAM N/A 03/26/2014   Procedure: LEFT HEART CATHETERIZATION WITH CORONARY ANGIOGRAM;  Surgeon: Peter M Swaziland, MD;  Location: [facility] CATH LAB;  Service: Cardiovascular;  Laterality: N/A;   PERCUTANEOUS CORONARY STENT INTERVENTION (PCI-S) N/A 04/01/2014   Procedure: PERCUTANEOUS CORONARY STENT INTERVENTION (PCI-S);  Surgeon: Peter M [place]az[place], MD;  Location: [facility] CATH LAB;  Service: Cardiovascular;  Laterality: N/A;   POLYPECTOMY  10/13/2022   Procedure: POLYPECTOMY;  Surgeon: Meridee Score Netty Starring., MD;  Location: [facility] ENDOSCOPY;  Service: Gastroenterology;;   POSTERIOR LAMINECTOMY / DECOMPRESSION LUMBAR SPINE  12/25/2009   L4 , Dr Newell Coral   SCLEROTHERAPY  10/13/2022   Procedure: SCLEROTHERAPY;  Surgeon: Lemar Lofty., MD;  Location: [facility] ENDOSCOPY;  Service: Gastroenterology;;   SHOULDER ARTHROSCOPY WITH ROTATOR CUFF REPAIR AND OPEN BICEPS TENODESIS Left 02/04/2019   Procedure: LEFT SHOULDER ARTHROSCOPY, BICEPS TENODESIS, MINI OPEN ROTATOR CUFF TEAR REPAIR;  Surgeon: Cammy Copa, MD;  Location: [facility];  Service: Orthopedics;  Laterality: Left;   SUBMUCOSAL INJECTION  09/02/2021   Procedure: SUBMUCOSAL INJECTION;  Surgeon: Meridee Score Netty Starring., MD;  Location: [facility] ENDOSCOPY;  Service: Gastroenterology;;   SUBMUCOSAL LIFTING INJECTION  04/14/2022   Procedure: SUBMUCOSAL LIFTING INJECTION;  Surgeon: Lemar Lofty., MD;  Location: [facility] ENDOSCOPY;  Service: Gastroenterology;;   TOTAL KNEE ARTHROPLASTY Right 05/11/2021   Procedure: TOTAL KNEE ARTHROPLASTY;  Surgeon: Durene Romans, MD;  Location: [facility];  Service: Orthopedics;  Laterality: Right;   Family History  Problem Relation Age of Onset   Heart attack Mother 110   Stroke Mother 57   Prostate cancer Father    Diabetes Father    Heart disease Father 4       CBAG     Heart disease Sister    Diabetes Brother    Heart attack Brother    Breast cancer Maternal Aunt    Heart attack Maternal Grandfather 43   Diabetes Paternal Grandmother    Stroke Paternal Grandfather 87   Stomach cancer Neg Hx    Rectal cancer Neg Hx    Esophageal cancer Neg Hx    Colon cancer Neg Hx    Inflammatory bowel disease Neg Hx    Liver disease Neg Hx    Pancreatic cancer Neg Hx    Social History   Socioeconomic History   Marital status: Single    Spouse name: Not on file   Number of children: 0   Years of education: 16   Highest education level: Bachelor's degree (e.g., BA, AB, BS)  Occupational History   Occupation: Retired   Tobacco Use   Smoking status: Never   Smokeless tobacco: Never  Vaping Use   Vaping status: Never Used  Substance and Sexual Activity   Alcohol use: Not Currently    Comment: social   Drug use: No   Sexual activity: Not Currently  Other Topics Concern   Not on file  Social History Narrative   Fun: Water quality scientist.    Social Determinants of Health   Financial Resource Strain: Low Risk  (04/03/2023)   Overall Financial Resource Strain (CARDIA)    Difficulty of Paying Living Expenses: Not hard at all  Food Insecurity: No Food Insecurity (04/03/2023)   Hunger Vital Sign    Worried About Running Out of Food in the Last Year: Never true    Ran Out of Food in the Last Year: Never true  Transportation Needs: No Transportation Needs (04/03/2023)   PRAPARE - Administrator, Civil Service (Medical): No    Lack of Transportation (Non-Medical): No  Physical Activity: Insufficiently Active (04/03/2023)   Exercise Vital Sign    Days of Exercise per Week: 3 days    Minutes of Exercise per Session: 30 min  Stress: No Stress Concern Present (04/03/2023)   [employer] of Occupational Health - Occupational Stress Questionnaire    Feeling of Stress : Not at all  Social Connections: Socially Isolated (04/03/2023)   Social Connection and  Isolation Panel [NHANES]    Frequency of Communication with Friends and Family: More than three times a week    Frequency of Social Gatherings with Friends and Family: Three times a week    Attends Religious Services: Never    Active Member of Clubs or Organizations: No    Attends Banker Meetings: Never    Marital Status: Never married    Tobacco Counseling Counseling given: Not Answered  Clinical Intake:  Pre-visit preparation completed: Yes  Pain : No/denies pain     BMI - recorded: 25.47 Nutritional Status: BMI 25 -29 Overweight Nutritional Risks: None Diabetes: Yes CBG done?: No Did pt. bring in CBG monitor from home?: No  How often do you need to have someone help you when you read instructions, pamphlets, or other written materials from your doctor or pharmacy?: 1 - Never  Interpreter Needed?: No  Information entered by :: Lanier Ensign, LPN   Activities of Daily Living    04/03/2023    7:22 AM 01/05/2023    2:45 PM  In your present state of health, do you have any difficulty performing the following activities:  Hearing? 0 0  Vision? 0 1  Difficulty concentrating or making decisions? 0 0  Walking or climbing stairs? 0 0  Dressing or bathing? 0 0  Doing errands, shopping? 0 0  Preparing Food and eating ? N   Using the Toilet? N   In the past six months, have you accidently leaked urine? N   Do you have problems with loss of bowel control? N   Managing your Medications? N   Managing your Finances? N   Housekeeping or managing your Housekeeping? N     Patient Care Team: Ardith Dark, MD as PCP - General (Family Medicine) [place], Peter M, MD as PCP - Cardiology (Cardiology) Hanley Seamen Dustin Folks, MD as Consulting Physician (Optometry) August Saucer, Corrie Mckusick, MD as Consulting Physician (Orthopedic Surgery) [facility] as Consulting Physician Erroll Luna, [facility] (Inactive) (Pharmacist) Karie Soda, MD as Consulting  Physician (General Surgery) Mansouraty, Netty Starring., MD as Consulting Physician (Gastroenterology) [place], Peter M, MD as Consulting Physician (Cardiology)  Indicate any recent Medical Services you may have received from other than [facility] providers in the past year (date may be approximate).     Assessment:   This is a routine wellness examination for [name] [name].  Hearing/Vision screen Hearing Screening - Comments:: Pt denies any hearing issues  Vision Screening - Comments:: Pt follows up with Dr Juliann Pulse for annual eye exams    Goals Addressed             This Visit's Progress    Patient Stated       Keep weight down        Depression Screen    04/06/2023    9:52 AM 02/06/2023    9:44 AM 09/14/2022    9:01 AM 05/30/2022   10:03 AM 03/24/2022    8:17 AM 03/15/2022    9:36 AM 03/18/2021   10:12 AM  PHQ 2/9 Scores  PHQ - 2 Score 0 0 0 0 0 0 0  PHQ- 9 Score       0    Fall Risk    04/03/2023    7:22 AM 02/06/2023    9:44 AM 09/14/2022    9:01 AM 05/30/2022   10:03 AM 03/24/2022    8:20 AM  Fall Risk   Falls in the past year? 0 0 0 0 0  Number falls in past yr: 0 0 0 0 0  Injury with Fall? 0 0 0 0 0  Risk for fall due to : Impaired vision No Fall Risks No Fall Risks No Fall Risks No Fall Risks;Impaired vision;Impaired balance/gait  Follow up Falls prevention discussed    Falls prevention discussed    MEDICARE RISK AT HOME: Medicare Risk at Home Any stairs in or around the home?: Yes If so,  are there any without handrails?: Yes Home free of loose throw rugs in walkways, pet beds, electrical cords, etc?: No Adequate lighting in your home to reduce risk of falls?: Yes Life alert?: No Use of a cane, walker or w/c?: No Grab bars in the bathroom?: No Shower chair or bench in shower?: Yes Elevated toilet seat or a handicapped toilet?: Yes  TIMED UP AND GO:  Was the test performed?  No    Cognitive Function:        04/06/2023    9:54 AM 03/24/2022    8:21 AM 03/18/2021    10:15 AM 03/12/2020    9:48 AM 01/31/2018   10:22 AM  6CIT Screen  What Year? 0 points 0 points 0 points 0 points 0 points  What month? 0 points 0 points 0 points 0 points 0 points  What time? 0 points 0 points 0 points  0 points  Count back from 20 0 points 0 points 0 points 0 points 0 points  Months in reverse 0 points 0 points 0 points 0 points 0 points  Repeat phrase 0 points 0 points 0 points 0 points   Total Score 0 points 0 points 0 points      Immunizations Immunization History  Administered Date(s) Administered   Fluad Quad(high Dose 65+) 02/21/2019, 04/07/2020, 03/08/2021, 03/15/2022   Influenza,inj,Quad PF,6+ Mos 03/19/2014   Influenza-Unspecified 03/16/2023   PFIZER Comirnaty(Gray Top)Covid-19 Tri-Sucrose Vaccine 03/28/2022   PFIZER(Purple Top)SARS-COV-2 Vaccination 08/01/2019, 08/27/2019, 03/25/2020, 03/12/2021   Pfizer Covid-19 Vaccine Bivalent Booster 41yrs & up 03/16/2023   Pneumococcal Conjugate-13 01/17/2017   Pneumococcal Polysaccharide-23 01/31/2018   Respiratory Syncytial Virus Vaccine,Recomb Aduvanted(Arexvy) 03/28/2022   Rsv, Bivalent, Protein Subunit Rsvpref,pf Verdis Frederickson) 03/28/2022   Smallpox 10/20/1955   Td 12/18/2008   Tdap 04/07/2020   Tetanus 12/13/1963   Vaccinia,smallpox Monkeypox Vaccine Live,pf 10/20/1955   Zoster, Live 12/24/2012    TDAP status: Up to date  Flu Vaccine status: Due, Education has been provided regarding the importance of this vaccine. Advised may receive this vaccine at local pharmacy or Health Dept. Aware to provide a copy of the vaccination record if obtained from local pharmacy or Health Dept. Verbalized acceptance and understanding.  Pneumococcal vaccine status: Up to date  Covid-19 vaccine status: Completed vaccines  Qualifies for Shingles Vaccine? Yes   Zostavax completed No   Shingrix Completed?: No.    Education has been provided regarding the importance of this vaccine. Patient has been advised to call insurance  company to determine out of pocket expense if they have not yet received this vaccine. Advised may also receive vaccine at local pharmacy or Health Dept. Verbalized acceptance and understanding.  Screening Tests Health Maintenance  Topic Date Due   FOOT EXAM  02/21/2020   Diabetic kidney evaluation - Urine ACR  03/16/2023   Zoster Vaccines- Shingrix (1 of 2) 05/09/2023 (Originally 01/22/2000)   OPHTHALMOLOGY EXAM  05/07/2023   HEMOGLOBIN A1C  06/16/2023   Diabetic kidney evaluation - eGFR measurement  01/11/2024   Medicare Annual Wellness (AWV)  04/05/2024   Colonoscopy  10/13/2027   DTaP/Tdap/Td (4 - Td or Tdap) 04/07/2030   Pneumonia Vaccine 21+ Years old  Completed   INFLUENZA VACCINE  Completed   COVID-19 Vaccine  Completed   Hepatitis C Screening  Completed   HPV VACCINES  Aged Out    Health Maintenance  Health Maintenance Due  Topic Date Due   FOOT EXAM  02/21/2020   Diabetic kidney evaluation - Urine ACR  03/16/2023    Colorectal cancer screening: Type of screening: Colonoscopy. Completed 10/13/22. Repeat every 5 years   Additional Screening:  Hepatitis C Screening: Completed 01/31/18  Vision Screening: Recommended annual ophthalmology exams for early detection of glaucoma and other disorders of the eye. Is the patient up to date with their annual eye exam?  Yes  Who is the provider or what is the name of the office in which the patient attends annual eye exams? Dr Juliann Pulse  If pt is not established with a provider, would they like to be referred to a provider to establish care? No .   Dental Screening: Recommended annual dental exams for proper oral hygiene  Diabetic Foot Exam: Diabetic Foot Exam: Overdue, Pt has been advised about the importance in completing this exam. Pt is scheduled for diabetic foot exam on next appt .  Community Resource Referral / Chronic Care Management: CRR required this visit?  No   CCM required this visit?  No     Plan:     I  have personally reviewed and noted the following in the patient's chart:   Medical and social history Use of alcohol, tobacco or illicit drugs  Current medications and supplements including opioid prescriptions. Patient is not currently taking opioid prescriptions. Functional ability and status Nutritional status Physical activity Advanced directives List of other physicians Hospitalizations, surgeries, and ER visits in previous 12 months Vitals Screenings to include cognitive, depression, and falls Referrals and appointments  In addition, I have reviewed and discussed with patient certain preventive protocols, quality metrics, and best practice recommendations. A written personalized care plan for preventive services as well as general preventive health recommendations were provided to patient.     Marzella Schlein, LPN   [id number]/29/[id number]   After Visit Summary: (MyChart) Due to this being a telephonic visit, the after visit summary with patients personalized plan was offered to patient via MyChart   Nurse Notes: none

## 2023-04-06 NOTE — Progress Notes (Signed)
Subjective:   [name] [name] is a 72 y.o. male who presents for Medicare Annual/Subsequent preventive examination.  Visit Complete: Virtual I connected with  [name] [name] on 04/06/23 by a audio enabled telemedicine application and verified that I am speaking with the correct person using two identifiers.  Patient Location: Home  Provider Location: Office/Clinic  I discussed the limitations of evaluation and management by telemedicine. The patient expressed understanding and agreed to proceed.  Vital Signs: Because this visit was a virtual/telehealth visit, some criteria may be missing or patient reported. Any vitals not documented were not able to be obtained and vitals that have been documented are patient reported.  Patient Medicare AWV questionnaire was completed by the patient on 04/03/23; I have confirmed that all information answered by patient is correct and no changes since this date.  Cardiac Risk Factors include: advanced age (>46men, >14 women);diabetes mellitus;dyslipidemia;hypertension;male genderBecause this visit was a virtual/telehealth visit, some criteria may be missing or patient reported. Any vitals not documented were not able to be obtained and vitals that have been documented are patient reported.       Objective:    Today's Vitals   04/06/23 0946  Weight: 170 lb (77.1 kg)   Body mass index is 25.47 kg/m.     04/06/2023    9:51 AM 01/05/2023    8:44 AM 12/26/2022    8:49 AM 10/13/2022   11:58 AM 06/06/2022    1:24 PM 04/14/2022    9:20 AM 03/24/2022    8:19 AM  Advanced Directives  Does Patient Have a Medical Advance Directive? Yes Yes Yes Yes Yes Yes Yes  Type of Estate agent of [place];Living will Healthcare Power of [place];Living will Healthcare Power of [place];Living will Healthcare Power of [employer] of [place];Living will Healthcare Power of [place];Living will  Does patient want to make changes to  medical advance directive? No - Patient declined No - Patient declined   No - Patient declined  No - Patient declined  Copy of Healthcare Power of Attorney in Chart? Yes - validated most recent copy scanned in chart (See row information) No - copy requested No - copy requested   No - copy requested Yes - validated most recent copy scanned in chart (See row information)    Current Medications (verified) Outpatient Encounter Medications as of 04/06/2023  Medication Sig   Accu-Chek Softclix Lancets lancets 1 each by Other route 3 (three) times daily.   atorvastatin (LIPITOR) 80 MG tablet TAKE 1 TABLET DAILY AT     6:00PM   Blood Glucose Monitoring Suppl (ACCU-CHEK AVIVA PLUS) w/Device KIT Check glucose TID or as needed E11.9   carvedilol (COREG) 3.125 MG tablet TAKE 1 TABLET TWICE DAILY  WITH MEALS   clopidogrel (PLAVIX) 75 MG tablet TAKE 1 TABLET DAILY   Coenzyme Q10 (COQ10) 100 MG CAPS Take 200 mg by mouth daily.   ferrous sulfate 325 (65 FE) MG tablet Take 325 mg by mouth daily.   FIBER PO Take 3 capsules by mouth daily.   glucose blood test strip Use as instructed   glucose blood test strip Use as instructed   hydrochlorothiazide (HYDRODIURIL) 25 MG tablet Take 1 tablet (25 mg total) by mouth daily.   irbesartan (AVAPRO) 300 MG tablet TAKE 1 TABLET DAILY   metFORMIN (GLUCOPHAGE-XR) 500 MG 24 hr tablet TAKE 2 TABLETS DAILY AFTER SUPPER   Multiple Vitamins-Minerals (MULTIVITAMIN WITH MINERALS) tablet Take 1 tablet by mouth daily.  nitroGLYCERIN (NITROSTAT) 0.4 MG SL tablet Place 1 tablet (0.4 mg total) under the tongue every 5 (five) minutes as needed for chest pain.   pantoprazole (PROTONIX) 40 MG tablet TAKE 1 TABLET TWICE DAILY  BEFORE MEALS (Patient taking differently: 40 mg daily.)   Propylene Glycol, PF, (SYSTANE COMPLETE PF) 0.6 % SOLN Place 1 drop into both eyes daily as needed (dry eyes).   sildenafil (REVATIO) 20 MG tablet Take 1-5 tablets by mouth daily as needed.   Undecylenic  Ac-Zn Undecylenate (FUNGI-NAIL TOE & FOOT EX) Apply 1 application  topically daily as needed (fungus).   vitamin B-12 (CYANOCOBALAMIN) 1000 MCG tablet Take 1,000 mcg by mouth daily.   Whey Protein POWD Take 2 Scoops by mouth daily.   [DISCONTINUED] Accu-Chek Softclix Lancets lancets 1 each by Other route 3 (three) times daily.   [DISCONTINUED] atorvastatin (LIPITOR) 80 MG tablet TAKE 1 TABLET DAILY AT     6:00PM   [DISCONTINUED] carvedilol (COREG) 3.125 MG tablet TAKE 1 TABLET TWICE DAILY  WITH MEALS   [DISCONTINUED] clopidogrel (PLAVIX) 75 MG tablet TAKE 1 TABLET DAILY   [DISCONTINUED] Coenzyme Q10 200 MG capsule Take 200 mg by mouth daily. gummie   [DISCONTINUED] famotidine-calcium carbonate-magnesium hydroxide (PEPCID COMPLETE) 10-800-165 MG chewable tablet Chew 0.5 tablets by mouth every 6 (six) hours as needed (heartburn).   [DISCONTINUED] ferrous sulfate 325 (65 FE) MG tablet Take 325 mg by mouth daily. (Patient not taking: Reported on 09/14/2022)   [DISCONTINUED] irbesartan (AVAPRO) 300 MG tablet TAKE 1 TABLET DAILY   [DISCONTINUED] sildenafil (REVATIO) 20 MG tablet Take 1-5 tablets by mouth daily as needed.   [DISCONTINUED] sodium chloride (OCEAN) 0.65 % SOLN nasal spray Place 1 spray into both nostrils as needed for congestion.   [DISCONTINUED] spironolactone (ALDACTONE) 25 MG tablet TAKE 1 TABLET DAILY   No facility-administered encounter medications on file as of 04/06/2023.    Allergies (verified) Aspirin, Oxycodone, Contrast media [iodinated contrast media], and Norvasc [amlodipine besylate]   History: Past Medical History:  Diagnosis Date   Adenomatous colon polyp 09/07/2004   Amblyopia    Anemia    Arthritis    CAD (coronary artery disease)    cardiac CT with a 50% LAD lesion in 2007   Diverticulosis    DM2 (diabetes mellitus, type 2) (HCC)    Elevated LFTs    secondary to labetalol   Esophageal stricture 2003   External hemorrhoids 2012   lanced by Dr Zachery Dakins    Family history of anesthesia complication    "sister had PONV"   GERD (gastroesophageal reflux disease)    esophageal stricture, pmh   Hiatal hernia 01/04/2000   History of kidney stones 2022   HLD (hyperlipidemia)    HTN (hypertension)    Hypopotassemia    Past Surgical History:  Procedure Laterality Date   BIOPSY  06/05/2021   Procedure: BIOPSY;  Surgeon: Iva Boop, MD;  Location: [facility] ENDOSCOPY;  Service: Endoscopy;;   BIOPSY  09/02/2021   Procedure: BIOPSY;  Surgeon: Lemar Lofty., MD;  Location: [facility] ENDOSCOPY;  Service: Gastroenterology;;   COLONOSCOPY  06/27/2004   tics , ADENOMATOUS polyps. F/U due 2011   COLONOSCOPY  06/27/2010   Tics   COLONOSCOPY WITH PROPOFOL N/A 09/02/2021   Procedure: COLONOSCOPY WITH PROPOFOL;  Surgeon: Meridee Score Netty Starring., MD;  Location: [facility] ENDOSCOPY;  Service: Gastroenterology;  Laterality: N/A;   COLONOSCOPY WITH PROPOFOL N/A 04/14/2022   Procedure: COLONOSCOPY WITH PROPOFOL;  Surgeon: Meridee Score Netty Starring., MD;  Location: [facility]  ENDOS[facility];  Service: Gastroenterology;  Laterality: N/A;   COLONOSCOPY WITH PROPOFOL N/A 10/13/2022   Procedure: COLONOSCOPY WITH PROPOFOL;  Surgeon: Meridee Score Netty Starring., MD;  Location: [facility]OS[facility];  Service: Gastroenterology;  Laterality: N/A;   CORONARY ANGIOPLASTY WITH STENT PLACEMENT  04/01/2014   "2"   ENDOSCOPIC MUCOSAL RESECTION  09/02/2021   Procedure: ENDOSCOPIC MUCOSAL RESECTION;  Surgeon: Meridee Score Netty Starring., MD;  Location: [facility] ENDOSCOPY;  Service: Gastroenterology;;   ENDOSCOPIC MUCOSAL RESECTION N/A 04/14/2022   Procedure: ENDOSCOPIC MUCOSAL RESECTION;  Surgeon: Lemar Lofty., MD;  Location: [facility] ENDOSCOPY;  Service: Gastroenterology;  Laterality: N/A;   ESOPHAGEAL BRUSHING  04/14/2022   Procedure: ESOPHAGEAL BRUSHING;  Surgeon: Lemar Lofty., MD;  Location: [facility] ENDOSCOPY;  Service: Gastroenterology;;   ESOPHAGEAL DILATION  06/27/2001   ESOPHAGOGASTRODUODENOSCOPY  06/27/2001    ERD, stricture dilated   ESOPHAGOGASTRODUODENOSCOPY (EGD) WITH PROPOFOL N/A 06/05/2021   Procedure: ESOPHAGOGASTRODUODENOSCOPY (EGD) WITH PROPOFOL;  Surgeon: Iva Boop, MD;  Location: [facility]OS[facility];  Service: Endoscopy;  Laterality: N/A;   ESOPHAGOGASTRODUODENOSCOPY (EGD) WITH PROPOFOL N/A 09/02/2021   Procedure: ESOPHAGOGASTRODUODENOSCOPY (EGD) WITH PROPOFOL;  Surgeon: Meridee Score Netty Starring., MD;  Location[facility] ENDOSCOPY;  Service: Gastroenterology;  Laterality: N/A;   ESOPHAGOGASTRODUODENOSCOPY (EGD) WITH PROPOFOL N/A 04/14/2022   Procedure: ESOPHAGOGASTRODUODENOSCOPY (EGD) WITH PROPOFOL;  Surgeon: Meridee Score Netty Starring., MD;  Location: [facility] ENDOSCOPY;  Service: Gastroenterology;  Laterality: N/A;   EXCISIONAL HEMORRHOIDECTOMY  ~ 2008   FULL THICKNESS RESECTION (FTRD) N/A 10/13/2022   Procedure: FULL THICKNESS RESECTION (FTRD);  Surgeon: Lemar Lofty., MD;  Location: [facility] ENDOSCOPY;  Service: Gastroenterology;  Laterality: N/A;   HEMOSTASIS CLIP PLACEMENT  09/02/2021   Procedure: HEMOSTASIS CLIP PLACEMENT;  Surgeon: Lemar Lofty., MD;  Location: [facility] ENDOSCOPY;  Service: Gastroenterology;;   HEMOSTASIS CLIP PLACEMENT  10/13/2022   Procedure: HEMOSTASIS CLIP PLACEMENT;  Surgeon: Lemar Lofty., MD;  Location: [facility] ENDOSCOPY;  Service: Gastroenterology;;   HEMOSTASIS CONTROL  04/14/2022   Procedure: HEMOSTASIS CONTROL;  Surgeon: Lemar Lofty., MD;  Location: [facility] ENDOSCOPY;  Service: Gastroenterology;;   HEMOSTASIS CONTROL  10/13/2022   Procedure: HEMOSTASIS CONTROL;  Surgeon: Lemar Lofty., MD;  Location: [facility] ENDOSCOPY;  Service: Gastroenterology;;   HOT HEMOSTASIS N/A 09/02/2021   Procedure: HOT HEMOSTASIS (ARGON PLASMA COAGULATION/BICAP);  Surgeon: Lemar Lofty., MD;  Location: [facility] ENDOSCOPY;  Service: Gastroenterology;  Laterality: N/A;   HOT HEMOSTASIS N/A 04/14/2022   Procedure: HOT HEMOSTASIS (ARGON PLASMA COAGULATION/BICAP);  Surgeon:  Lemar Lofty., MD;  Location: [facility] ENDOSCOPY;  Service: Gastroenterology;  Laterality: N/A;   LEFT HEART CATHETERIZATION WITH CORONARY ANGIOGRAM N/A 03/26/2014   Procedure: LEFT HEART CATHETERIZATION WITH CORONARY ANGIOGRAM;  Surgeon: Peter M Swaziland, MD;  Location: [facility] CATH LAB;  Service: Cardiovascular;  Laterality: N/A;   PERCUTANEOUS CORONARY STENT INTERVENTION (PCI-S) N/A 04/01/2014   Procedure: PERCUTANEOUS CORONARY STENT INTERVENTION (PCI-S);  Surgeon: Peter M [place]aziland, MD;  Location: [facility] CATH LAB;  Service: Cardiovascular;  Laterality: N/A;   POLYPECTOMY  10/13/2022   Procedure: POLYPECTOMY;  Surgeon: Meridee Score Netty Starring., MD;  Location: [facility] ENDOSCOPY;  Service: Gastroenterology;;   POSTERIOR LAMINECTOMY / DECOMPRESSION LUMBAR SPINE  12/25/2009   L4 , Dr Newell Coral   SCLEROTHERAPY  10/13/2022   Procedure: SCLEROTHERAPY;  Surgeon: Lemar Lofty., MD;  Location: [facility] ENDOSCOPY;  Service: Gastroenterology;;   SHOULDER ARTHROSCOPY WITH ROTATOR CUFF REPAIR AND OPEN BICEPS TENODESIS Left 02/04/2019   Procedure: LEFT SHOULDER ARTHROSCOPY, BICEPS TENODESIS, MINI OPEN ROTATOR CUFF TEAR REPAIR;  Surgeon: Cammy Copa, MD;  Location: [facility];  Service: Orthopedics;  Laterality: Left;   SUBMUCOSAL INJECTION  09/02/2021   Procedure: SUBMUCOSAL INJECTION;  Surgeon: Meridee Score Netty Starring., MD;  Location: [facility] ENDOSCOPY;  Service: Gastroenterology;;   SUBMUCOSAL LIFTING INJECTION  04/14/2022   Procedure: SUBMUCOSAL LIFTING INJECTION;  Surgeon: Lemar Lofty., MD;  Location: [facility] ENDOSCOPY;  Service: Gastroenterology;;   TOTAL KNEE ARTHROPLASTY Right 05/11/2021   Procedure: TOTAL KNEE ARTHROPLASTY;  Surgeon: Durene Romans, MD;  Location: [facility];  Service: Orthopedics;  Laterality: Right;   Family History  Problem Relation Age of Onset   Heart attack Mother 45   Stroke Mother 95   Prostate cancer Father    Diabetes Father    Heart disease Father 20       CBAG     Heart disease Sister    Diabetes Brother    Heart attack Brother    Breast cancer Maternal Aunt    Heart attack Maternal Grandfather 67   Diabetes Paternal Grandmother    Stroke Paternal Grandfather 64   Stomach cancer Neg Hx    Rectal cancer Neg Hx    Esophageal cancer Neg Hx    Colon cancer Neg Hx    Inflammatory bowel disease Neg Hx    Liver disease Neg Hx    Pancreatic cancer Neg Hx    Social History   Socioeconomic History   Marital status: Single    Spouse name: Not on file   Number of children: 0   Years of education: 16   Highest education level: Bachelor's degree (e.g., BA, AB, BS)  Occupational History   Occupation: Retired   Tobacco Use   Smoking status: Never   Smokeless tobacco: Never  Vaping Use   Vaping status: Never Used  Substance and Sexual Activity   Alcohol use: Not Currently    Comment: social   Drug use: No   Sexual activity: Not Currently  Other Topics Concern   Not on file  Social History Narrative   Fun: Water quality scientist.    Social Determinants of Health   Financial Resource Strain: Low Risk  (04/03/2023)   Overall Financial Resource Strain (CARDIA)    Difficulty of Paying Living Expenses: Not hard at all  Food Insecurity: No Food Insecurity (04/03/2023)   Hunger Vital Sign    Worried About Running Out of Food in the Last Year: Never true    Ran Out of Food in the Last Year: Never true  Transportation Needs: No Transportation Needs (04/03/2023)   PRAPARE - Administrator, Civil Service (Medical): No    Lack of Transportation (Non-Medical): No  Physical Activity: Insufficiently Active (04/03/2023)   Exercise Vital Sign    Days of Exercise per Week: 3 days    Minutes of Exercise per Session: 30 min  Stress: No Stress Concern Present (04/03/2023)   [employer] of Occupational Health - Occupational Stress Questionnaire    Feeling of Stress : Not at all  Social Connections: Socially Isolated (04/03/2023)   Social Connection and  Isolation Panel [NHANES]    Frequency of Communication with Friends and Family: More than three times a week    Frequency of Social Gatherings with Friends and Family: Three times a week    Attends Religious Services: Never    Active Member of Clubs or Organizations: No    Attends Banker Meetings: Never    Marital Status: Never married    Tobacco Counseling Counseling given: Not Answered  Clinical Intake:  Pre-visit preparation completed: Yes  Pain : No/denies pain     BMI - recorded: 25.47 Nutritional Status: BMI 25 -29 Overweight Nutritional Risks: None Diabetes: Yes CBG done?: No Did pt. bring in CBG monitor from home?: No  How often do you need to have someone help you when you read instructions, pamphlets, or other written materials from your doctor or pharmacy?: 1 - Never  Interpreter Needed?: No  Information entered by :: Lanier Ensign, LPN   Activities of Daily Living    04/03/2023    7:22 AM 01/05/2023    2:45 PM  In your present state of health, do you have any difficulty performing the following activities:  Hearing? 0 0  Vision? 0 1  Difficulty concentrating or making decisions? 0 0  Walking or climbing stairs? 0 0  Dressing or bathing? 0 0  Doing errands, shopping? 0 0  Preparing Food and eating ? N   Using the Toilet? N   In the past six months, have you accidently leaked urine? N   Do you have problems with loss of bowel control? N   Managing your Medications? N   Managing your Finances? N   Housekeeping or managing your Housekeeping? N     Patient Care Team: Ardith Dark, MD as PCP - General (Family Medicine) [place], Peter M, MD as PCP - Cardiology (Cardiology) Hanley Seamen Dustin Folks, MD as Consulting Physician (Optometry) August Saucer, Corrie Mckusick, MD as Consulting Physician (Orthopedic Surgery) [facility] as Consulting Physician Erroll Luna, [facility] (Inactive) (Pharmacist) Karie Soda, MD as Consulting  Physician (General Surgery) Mansouraty, Netty Starring., MD as Consulting Physician (Gastroenterology) [place], Peter M, MD as Consulting Physician (Cardiology)  Indicate any recent Medical Services you may have received from other than [facility] providers in the past year (date may be approximate).     Assessment:   This is a routine wellness examination for Brighten.  Hearing/Vision screen Hearing Screening - Comments:: Pt denies any hearing issues  Vision Screening - Comments:: Pt follows up with Dr Juliann Pulse for annual eye exams    Goals Addressed             This Visit's Progress    Patient Stated       Keep weight down        Depression Screen    04/06/2023    9:52 AM 02/06/2023    9:44 AM 09/14/2022    9:01 AM 05/30/2022   10:03 AM 03/24/2022    8:17 AM 03/15/2022    9:36 AM 03/18/2021   10:12 AM  PHQ 2/9 Scores  PHQ - 2 Score 0 0 0 0 0 0 0  PHQ- 9 Score       0    Fall Risk    04/03/2023    7:22 AM 02/06/2023    9:44 AM 09/14/2022    9:01 AM 05/30/2022   10:03 AM 03/24/2022    8:20 AM  Fall Risk   Falls in the past year? 0 0 0 0 0  Number falls in past yr: 0 0 0 0 0  Injury with Fall? 0 0 0 0 0  Risk for fall due to : Impaired vision No Fall Risks No Fall Risks No Fall Risks No Fall Risks;Impaired vision;Impaired balance/gait  Follow up Falls prevention discussed    Falls prevention discussed    MEDICARE RISK AT HOME: Medicare Risk at Home Any stairs in or around the home?: Yes If so,  are there any without handrails?: Yes Home free of loose throw rugs in walkways, pet beds, electrical cords, etc?: No Adequate lighting in your home to reduce risk of falls?: Yes Life alert?: No Use of a cane, walker or w/c?: No Grab bars in the bathroom?: No Shower chair or bench in shower?: Yes Elevated toilet seat or a handicapped toilet?: Yes  TIMED UP AND GO:  Was the test performed?  No    Cognitive Function:        04/06/2023    9:54 AM 03/24/2022    8:21 AM 03/18/2021    10:15 AM 03/12/2020    9:48 AM 01/31/2018   10:22 AM  6CIT Screen  What Year? 0 points 0 points 0 points 0 points 0 points  What month? 0 points 0 points 0 points 0 points 0 points  What time? 0 points 0 points 0 points  0 points  Count back from 20 0 points 0 points 0 points 0 points 0 points  Months in reverse 0 points 0 points 0 points 0 points 0 points  Repeat phrase 0 points 0 points 0 points 0 points   Total Score 0 points 0 points 0 points      Immunizations Immunization History  Administered Date(s) Administered   Fluad Quad(high Dose 65+) 02/21/2019, 04/07/2020, 03/08/2021, 03/15/2022   Influenza,inj,Quad PF,6+ Mos 03/19/2014   Influenza-Unspecified 03/16/2023   PFIZER Comirnaty(Gray Top)Covid-19 Tri-Sucrose Vaccine 03/28/2022   PFIZER(Purple Top)SARS-COV-2 Vaccination 08/01/2019, 08/27/2019, 03/25/2020, 03/12/2021   Pfizer Covid-19 Vaccine Bivalent Booster 20yrs & up 03/16/2023   Pneumococcal Conjugate-13 01/17/2017   Pneumococcal Polysaccharide-23 01/31/2018   Respiratory Syncytial Virus Vaccine,Recomb Aduvanted(Arexvy) 03/28/2022   Rsv, Bivalent, Protein Subunit Rsvpref,pf Verdis Frederickson) 03/28/2022   Smallpox 10/20/1955   Td 12/18/2008   Tdap 04/07/2020   Tetanus 12/13/1963   Vaccinia,smallpox Monkeypox Vaccine Live,pf 10/20/1955   Zoster, Live 12/24/2012    TDAP status: Up to date  Flu completed 03/16/23  Pneumococcal vaccine status: Up to date  Covid-19 vaccine status: Completed vaccines  Qualifies for Shingles Vaccine? Yes   Zostavax completed No   Shingrix Completed?: No.    Education has been provided regarding the importance of this vaccine. Patient has been advised to call insurance company to determine out of pocket expense if they have not yet received this vaccine. Advised may also receive vaccine at local pharmacy or Health Dept. Verbalized acceptance and understanding.  Screening Tests Health Maintenance  Topic Date Due   FOOT EXAM  02/21/2020    Diabetic kidney evaluation - Urine ACR  03/16/2023   Zoster Vaccines- Shingrix (1 of 2) 05/09/2023 (Originally 01/22/2000)   OPHTHALMOLOGY EXAM  05/07/2023   HEMOGLOBIN A1C  06/16/2023   Diabetic kidney evaluation - eGFR measurement  01/11/2024   Medicare Annual Wellness (AWV)  04/05/2024   Colonoscopy  10/13/2027   DTaP/Tdap/Td (4 - Td or Tdap) 04/07/2030   Pneumonia Vaccine 63+ Years old  Completed   INFLUENZA VACCINE  Completed   COVID-19 Vaccine  Completed   Hepatitis C Screening  Completed   HPV VACCINES  Aged Out    Health Maintenance  Health Maintenance Due  Topic Date Due   FOOT EXAM  02/21/2020   Diabetic kidney evaluation - Urine ACR  03/16/2023    Colorectal cancer screening: Type of screening: Colonoscopy. Completed 10/13/22. Repeat every 5 years   Additional Screening:  Hepatitis C Screening: Completed 01/31/18  Vision Screening: Recommended annual ophthalmology exams for early detection of glaucoma and other disorders of  the eye. Is the patient up to date with their annual eye exam?  Yes  Who is the provider or what is the name of the office in which the patient attends annual eye exams? Dr Juliann Pulse  If pt is not established with a provider, would they like to be referred to a provider to establish care? No .   Dental Screening: Recommended annual dental exams for proper oral hygiene  Diabetic Foot Exam: Diabetic Foot Exam: Overdue, Pt has been advised about the importance in completing this exam. Pt is scheduled for diabetic foot exam on next appt .  Community Resource Referral / Chronic Care Management: CRR required this visit?  No   CCM required this visit?  No     Plan:     I have personally reviewed and noted the following in the patient's chart:   Medical and social history Use of alcohol, tobacco or illicit drugs  Current medications and supplements including opioid prescriptions. Patient is not currently taking opioid prescriptions. Functional  ability and status Nutritional status Physical activity Advanced directives List of other physicians Hospitalizations, surgeries, and ER visits in previous 12 months Vitals Screenings to include cognitive, depression, and falls Referrals and appointments  In addition, I have reviewed and discussed with patient certain preventive protocols, quality metrics, and best practice recommendations. A written personalized care plan for preventive services as well as general preventive health recommendations were provided to patient.     Marzella Schlein, LPN   16/03/9603   After Visit Summary: (MyChart) Due to this being a telephonic visit, the after visit summary with patients personalized plan was offered to patient via MyChart   Nurse Notes: none

## 2023-04-06 NOTE — Patient Instructions (Signed)
[name] [name] , Thank you for taking time to come for your Medicare Wellness Visit. I appreciate your ongoing commitment to your health goals. Please review the following plan we discussed and let me know if I can assist you in the future.   Referrals/Orders/Follow-Ups/Clinician Recommendations: keep weight down Aim for 30 minutes of exercise or brisk walking, 6-8 glasses of water, and 5 servings of fruits and vegetables each day.   This is a list of the screening recommended for you and due dates:  Health Maintenance  Topic Date Due   Complete foot exam   02/21/2020   Yearly kidney health urinalysis for diabetes  03/16/2023   Zoster (Shingles) Vaccine (1 of 2) 05/09/2023*   Flu Shot  09/25/2023*   Eye exam for diabetics  05/07/2023   Hemoglobin A1C  06/16/2023   Yearly kidney function blood test for diabetes  01/11/2024   Medicare Annual Wellness Visit  04/05/2024   Colon Cancer Screening  10/13/2027   DTaP/Tdap/Td vaccine (4 - Td or Tdap) 04/07/2030   Pneumonia Vaccine  Completed   COVID-19 Vaccine  Completed   Hepatitis C Screening  Completed   HPV Vaccine  Aged Out  *Topic was postponed. The date shown is not the original due date.    Advanced directives: (In Chart) A copy of your advanced directives are scanned into your chart should your provider ever need it.  Next Medicare Annual Wellness Visit scheduled for next year: Yes

## 2023-05-15 ENCOUNTER — Other Ambulatory Visit: Payer: Self-pay | Admitting: Family Medicine

## 2023-05-17 ENCOUNTER — Other Ambulatory Visit: Payer: Self-pay | Admitting: Family Medicine

## 2023-05-29 ENCOUNTER — Encounter: Payer: Self-pay | Admitting: Family Medicine

## 2023-05-29 ENCOUNTER — Ambulatory Visit (INDEPENDENT_AMBULATORY_CARE_PROVIDER_SITE_OTHER): Payer: Medicare Other | Admitting: Family Medicine

## 2023-05-29 VITALS — BP 124/79 | HR 68 | Temp 97.5°F | Resp 16 | Ht 68.5 in | Wt 179.1 lb

## 2023-05-29 DIAGNOSIS — J209 Acute bronchitis, unspecified: Secondary | ICD-10-CM | POA: Diagnosis not present

## 2023-05-29 MED ORDER — BENZONATATE 100 MG PO CAPS: 100.0000 mg | ORAL_CAPSULE | Freq: Three times a day (TID) | ORAL | 0 refills | Status: DC | PRN

## 2023-05-29 MED ORDER — AZITHROMYCIN 250 MG PO TABS: ORAL_TABLET | ORAL | 0 refills | Status: AC

## 2023-05-29 NOTE — Patient Instructions (Signed)
Meds sent to pharmacy  Worse, no change, let us know

## 2023-05-29 NOTE — Progress Notes (Signed)
Subjective:     Patient ID: [name] [name], male    DOB: 1951-01-13, 72 y.o.   MRN: [id number]  Chief Complaint  Patient presents with   Cough    Lingering deep cough with yellow sputum that started about 10 days ago, feels like it is in his chest, would like medication to help with cough    HPI URI/cough.  Started 10d ago. Will get into coughing fits and yellow sputum.  No SOB.worst in am. Some rattle in chest at times. Sinuses 2 mo ago. No runny nose/congestion.  All in chest.  No f/c.  No heartburn. No h/o smoking.  No h/o asthma.   Health Maintenance Due  Topic Date Due   FOOT EXAM  02/21/2020   Diabetic kidney evaluation - Urine ACR  03/16/2023    Past Medical History:  Diagnosis Date   Adenomatous colon polyp 09/07/2004   Amblyopia    Anemia    Arthritis    CAD (coronary artery disease)    cardiac CT with a 50% LAD lesion in 2007   Diverticulosis    DM2 (diabetes mellitus, type 2) (HCC)    Elevated LFTs    secondary to labetalol   Esophageal stricture 2003   External hemorrhoids 2012   lanced by Dr Zachery Dakins   Family history of anesthesia complication    "sister had PONV"   GERD (gastroesophageal reflux disease)    esophageal stricture, pmh   Hiatal hernia 01/04/2000   History of kidney stones 2022   HLD (hyperlipidemia)    HTN (hypertension)    Hypopotassemia     Past Surgical History:  Procedure Laterality Date   BIOPSY  06/05/2021   Procedure: BIOPSY;  Surgeon: Iva Boop, MD;  Location: [facility] ENDOSCOPY;  Service: Endoscopy;;   BIOPSY  09/02/2021   Procedure: BIOPSY;  Surgeon: Lemar Lofty., MD;  Location: [facility] ENDOSCOPY;  Service: Gastroenterology;;   COLONOSCOPY  06/27/2004   tics , ADENOMATOUS polyps. F/U due 2011   COLONOSCOPY  06/27/2010   Tics   COLONOSCOPY WITH PROPOFOL N/A 09/02/2021   Procedure: COLONOSCOPY WITH PROPOFOL;  Surgeon: Meridee Score Netty Starring., MD;  Location: [facility] ENDOSCOPY;  Service: Gastroenterology;  Laterality: N/A;    COLONOSCOPY WITH PROPOFOL N/A 04/14/2022   Procedure: COLONOSCOPY WITH PROPOFOL;  Surgeon: Meridee Score Netty Starring., MD;  Location: [facility]OS[facility];  Service: Gastroenterology;  Laterality: N/A;   COLONOSCOPY WITH PROPOFOL N/A 10/13/2022   Procedure: COLONOSCOPY WITH PROPOFOL;  Surgeon: Meridee Score Netty Starring., MD;  Location: [facility];  Service: Gastroenterology;  Laterality: N/A;   CORONARY ANGIOPLASTY WITH STENT PLACEMENT  04/01/2014   "2"   ENDOSCOPIC MUCOSAL RESECTION  09/02/2021   Procedure: ENDOSCOPIC MUCOSAL RESECTION;  Surgeon: Meridee Score Netty Starring., MD;  Location: [facility] ENDOSCOPY;  Service: Gastroenterology;;   ENDOSCOPIC MUCOSAL RESECTION N/A 04/14/2022   Procedure: ENDOSCOPIC MUCOSAL RESECTION;  Surgeon: Lemar Lofty., MD;  Location: [facility]OS[facility];  Service: Gastroenterology;  Laterality: N/A;   ESOPHAGEAL BRUSHING  04/14/2022   Procedure: ESOPHAGEAL BRUSHING;  Surgeon: Lemar Lofty., MD;  Location: [facility]OS[facility];  Service: Gastroenterology;;   ESOPHAGEAL DILATION  06/27/2001   ESOPHAGOGASTRODUODENOSCOPY  06/27/2001   ERD, stricture dilated   ESOPHAGOGASTRODUODENOSCOPY (EGD) WITH PROPOFOL N/A 06/05/2021   Procedure: ESOPHAGOGASTRODUODENOSCOPY (EGD) WITH PROPOFOL;  Surgeon: Iva Boop, MD;  Location: [facility]OS[facility];  Service: Endoscopy;  Laterality: N/A;   ESOPHAGOGASTRODUODENOSCOPY (EGD) WITH PROPOFOL N/A 09/02/2021   Procedure: ESOPHAGOGASTRODUODENOSCOPY (EGD) WITH PROPOFOL;  Surgeon: Meridee Score Netty Starring., MD;  Location: [facility]  ENDOSCOPY;  Service: Gastroenterology;  Laterality: N/A;   ESOPHAGOGASTRODUODENOSCOPY (EGD) WITH PROPOFOL N/A 04/14/2022   Procedure: ESOPHAGOGASTRODUODENOSCOPY (EGD) WITH PROPOFOL;  Surgeon: Meridee Score Netty Starring., MD;  Location: [facility] ENDOS[facility];  Service: Gastroenterology;  Laterality: N/A;   EXCISIONAL HEMORRHOIDECTOMY  ~ 2008   FULL THICKNESS RESECTION (FTRD) N/A 10/13/2022   Procedure: FULL THICKNESS RESECTION (FTRD);  Surgeon:  Lemar Lofty., MD;  Location: [facility] ENDOSCOPY;  Service: Gastroenterology;  Laterality: N/A;   HEMOSTASIS CLIP PLACEMENT  09/02/2021   Procedure: HEMOSTASIS CLIP PLACEMENT;  Surgeon: Lemar Lofty., MD;  Location: [facility] ENDOSCOPY;  Service: Gastroenterology;;   HEMOSTASIS CLIP PLACEMENT  10/13/2022   Procedure: HEMOSTASIS CLIP PLACEMENT;  Surgeon: Lemar Lofty., MD;  Location: [facility] ENDOSCOPY;  Service: Gastroenterology;;   HEMOSTASIS CONTROL  04/14/2022   Procedure: HEMOSTASIS CONTROL;  Surgeon: Lemar Lofty., MD;  Location: [facility] ENDOSCOPY;  Service: Gastroenterology;;   HEMOSTASIS CONTROL  10/13/2022   Procedure: HEMOSTASIS CONTROL;  Surgeon: Lemar Lofty., MD;  Location: [facility] ENDOSCOPY;  Service: Gastroenterology;;   HOT HEMOSTASIS N/A 09/02/2021   Procedure: HOT HEMOSTASIS (ARGON PLASMA COAGULATION/BICAP);  Surgeon: Lemar Lofty., MD;  Location: [facility] ENDOSCOPY;  Service: Gastroenterology;  Laterality: N/A;   HOT HEMOSTASIS N/A 04/14/2022   Procedure: HOT HEMOSTASIS (ARGON PLASMA COAGULATION/BICAP);  Surgeon: Lemar Lofty., MD;  Location: [facility] ENDOSCOPY;  Service: Gastroenterology;  Laterality: N/A;   LEFT HEART CATHETERIZATION WITH CORONARY ANGIOGRAM N/A 03/26/2014   Procedure: LEFT HEART CATHETERIZATION WITH CORONARY ANGIOGRAM;  Surgeon: Peter M Swaziland, MD;  Location: [facility] CATH LAB;  Service: Cardiovascular;  Laterality: N/A;   PARTIAL COLECTOMY  01/05/2023   R.  polyp-large   PERCUTANEOUS CORONARY STENT INTERVENTION (PCI-S) N/A 04/01/2014   Procedure: PERCUTANEOUS CORONARY STENT INTERVENTION (PCI-S);  Surgeon: Peter M Swaziland, MD;  Location: [facility] CATH LAB;  Service: Cardiovascular;  Laterality: N/A;   POLYPECTOMY  10/13/2022   Procedure: POLYPECTOMY;  Surgeon: Meridee Score Netty Starring., MD;  Location: [facility] ENDOSCOPY;  Service: Gastroenterology;;   POSTERIOR LAMINECTOMY / DECOMPRESSION LUMBAR SPINE  12/25/2009   L4 , Dr Newell Coral   SCLEROTHERAPY   10/13/2022   Procedure: SCLEROTHERAPY;  Surgeon: Lemar Lofty., MD;  Location: [facility] ENDOSCOPY;  Service: Gastroenterology;;   SHOULDER ARTHROSCOPY WITH ROTATOR CUFF REPAIR AND OPEN BICEPS TENODESIS Left 02/04/2019   Procedure: LEFT SHOULDER ARTHROSCOPY, BICEPS TENODESIS, MINI OPEN ROTATOR CUFF TEAR REPAIR;  Surgeon: Cammy Copa, MD;  Location: [facility];  Service: Orthopedics;  Laterality: Left;   SUBMUCOSAL INJECTION  09/02/2021   Procedure: SUBMUCOSAL INJECTION;  Surgeon: Meridee Score Netty Starring., MD;  Location: [facility] ENDOSCOPY;  Service: Gastroenterology;;   SUBMUCOSAL LIFTING INJECTION  04/14/2022   Procedure: SUBMUCOSAL LIFTING INJECTION;  Surgeon: Lemar Lofty., MD;  Location: [facility] ENDOSCOPY;  Service: Gastroenterology;;   TOTAL KNEE ARTHROPLASTY Right 05/11/2021   Procedure: TOTAL KNEE ARTHROPLASTY;  Surgeon: Durene Romans, MD;  Location: [facility] ORS;  Service: Orthopedics;  Laterality: Right;     Current Outpatient Medications:    Accu-Chek Softclix Lancets lancets, 1 each by Other route 3 (three) times daily., Disp: 102 each, Rfl: 11   atorvastatin (LIPITOR) 80 MG tablet, TAKE 1 TABLET DAILY AT     6:00PM, Disp: 90 tablet, Rfl: 0   azithromycin (ZITHROMAX) 250 MG tablet, Take 2 tablets on day 1, then 1 tablet daily on days 2 through 5, Disp: 6 tablet, Rfl: 0   benzonatate (TESSALON PERLES) 100 MG capsule, Take 1 capsule (100 mg total) by mouth 3 (three) times daily  as needed., Disp: 20 capsule, Rfl: 0   Blood Glucose Monitoring Suppl (ACCU-CHEK AVIVA PLUS) w/Device KIT, Check glucose TID or as needed E11.9, Disp: 1 kit, Rfl: 0   carvedilol (COREG) 3.125 MG tablet, TAKE 1 TABLET TWICE DAILY  WITH MEALS, Disp: 180 tablet, Rfl: 0   clopidogrel (PLAVIX) 75 MG tablet, TAKE 1 TABLET DAILY, Disp: 90 tablet, Rfl: 1   Coenzyme Q10 (COQ10) 100 MG CAPS, Take 200 mg by mouth daily., Disp: , Rfl:    ferrous sulfate 325 (65 FE) MG tablet, Take 325 mg by mouth daily.,  Disp: , Rfl:    FIBER PO, Take 3 capsules by mouth daily., Disp: , Rfl:    glucose blood test strip, Use as instructed, Disp: 100 each, Rfl: 12   glucose blood test strip, Use as instructed, Disp: 100 each, Rfl: 12   irbesartan (AVAPRO) 300 MG tablet, TAKE 1 TABLET DAILY, Disp: 90 tablet, Rfl: 0   metFORMIN (GLUCOPHAGE-XR) 500 MG 24 hr tablet, TAKE 2 TABLETS DAILY AFTER SUPPER, Disp: 180 tablet, Rfl: 3   Multiple Vitamins-Minerals (MULTIVITAMIN WITH MINERALS) tablet, Take 1 tablet by mouth daily., Disp: , Rfl:    nitroGLYCERIN (NITROSTAT) 0.4 MG SL tablet, Place 1 tablet (0.4 mg total) under the tongue every 5 (five) minutes as needed for chest pain., Disp: 100 tablet, Rfl: 1   pantoprazole (PROTONIX) 40 MG tablet, TAKE 1 TABLET TWICE DAILY  BEFORE MEALS (Patient taking differently: 40 mg daily.), Disp: 180 tablet, Rfl: 1   Propylene Glycol, PF, (SYSTANE COMPLETE PF) 0.6 % SOLN, Place 1 drop into both eyes daily as needed (dry eyes)., Disp: , Rfl:    sildenafil (REVATIO) 20 MG tablet, Take 1-5 tablets by mouth daily as needed., Disp: 100 tablet, Rfl: 0   Undecylenic Ac-Zn Undecylenate (FUNGI-NAIL TOE & FOOT EX), Apply 1 application  topically daily as needed (fungus)., Disp: , Rfl:    vitamin B-12 (CYANOCOBALAMIN) 1000 MCG tablet, Take 1,000 mcg by mouth daily., Disp: , Rfl:    Whey Protein POWD, Take 2 Scoops by mouth daily., Disp: , Rfl:    hydrochlorothiazide (HYDRODIURIL) 25 MG tablet, Take 1 tablet (25 mg total) by mouth daily., Disp: 90 tablet, Rfl: 3  Allergies  Allergen Reactions   Aspirin Hives, Swelling and Other (See Comments)    Angioedema   Oxycodone Nausea Only   Contrast Media [Iodinated Contrast Media] Hives, Itching, Rash and Other (See Comments)    Developed hives despite pre-treatment   Norvasc [Amlodipine Besylate] Other (See Comments) and Cough    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  LUNGS: CTAB. No wheezes EXT:  no edema MSK: no gross abnormalities.  NEURO: A&O x3.  CN II-XII intact.  PSYCH: normal  mood. Good eye contact     Assessment & Plan:  Acute bronchitis due to infection  Other orders -     Azithromycin; Take 2 tablets on day 1, then 1 tablet daily on days 2 through 5  Dispense: 6 tablet; Refill: 0 -     Benzonatate; Take 1 capsule (100 mg total) by mouth 3 (three) times daily as needed.  Dispense: 20 capsule; Refill: 0   Bronchitis-will tx w/zpk and tessalon perles 100mg  tid prn.  Worse, no change, let us know.   Return if symptoms worsen or fail to improve.  Angelena Sole, MD

## 2023-06-05 ENCOUNTER — Telehealth: Payer: Self-pay | Admitting: Cardiology

## 2023-06-05 MED ORDER — HYDROCHLOROTHIAZIDE 25 MG PO TABS: 25.0000 mg | ORAL_TABLET | Freq: Every day | ORAL | 1 refills | Status: DC

## 2023-06-05 NOTE — Telephone Encounter (Signed)
*  STAT* If patient is at the pharmacy, call can be transferred to refill team.   1. Which medications need to be refilled? (please list name of each medication and dose if known) hydrochlorothiazide (HYDRODIURIL) 25 MG tablet (Expired)   2. Which pharmacy/location (including street and city if local pharmacy) is medication to be sent to? [id number] - [place], [place] - [phone] Phone: [phone]  Fax: [phone]     3. Do they need a 30 day or 90 day supply? 90

## 2023-06-08 DIAGNOSIS — H04123 Dry eye syndrome of bilateral lacrimal glands: Secondary | ICD-10-CM | POA: Diagnosis not present

## 2023-06-08 DIAGNOSIS — H53001 Unspecified amblyopia, right eye: Secondary | ICD-10-CM | POA: Diagnosis not present

## 2023-06-08 DIAGNOSIS — E119 Type 2 diabetes mellitus without complications: Secondary | ICD-10-CM | POA: Diagnosis not present

## 2023-06-08 DIAGNOSIS — H40013 Open angle with borderline findings, low risk, bilateral: Secondary | ICD-10-CM | POA: Diagnosis not present

## 2023-06-08 DIAGNOSIS — H25813 Combined forms of age-related cataract, bilateral: Secondary | ICD-10-CM | POA: Diagnosis not present

## 2023-06-08 LAB — HM DIABETES EYE EXAM

## 2023-06-11 ENCOUNTER — Other Ambulatory Visit: Payer: Self-pay | Admitting: Family Medicine

## 2023-07-23 NOTE — Progress Notes (Signed)
[name] [name] Date of Birth: Dec 02, 1950 Medical Record #[id number]  History of Present Illness: [name] [name] is seen for cardiac followup CAD.  He has a history of coronary disease diagnosed by cardiac CTA in 2007. In September 2015 he presented with chest pain. Myoview study demonstrated anterior wall ischemia. Subsequent cardiac cath demonstrated severe disease in the mid LAD and first diagonal with 50-70% disease in the OM2. On 04/01/14 he had successful stenting of the LAD and diagonal with DES. FFR of the OM was normal. Despite aggressive pretreatment for dye allergy with steroids, pepcid, and benadryl he still developed hives and diarrhea post procedure.   Myoview study was done March 2022 and was low risk. Some anterior wall scar without ischemia. EF 49%.   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 He has recurrent colon polyps and is s/p right hemicolectomy last July.    He is on Plavix chronically due to history of ASA allergy.   On follow up today he feels very well. No chest pain or dyspnea. No palpitation or edema. He is active.   Current Outpatient Medications on File Prior to Visit  Medication Sig Dispense Refill   Accu-Chek Softclix Lancets lancets 1 each by Other route 3 (three) times daily. 102 each 11   atorvastatin (LIPITOR) 80 MG tablet TAKE 1 TABLET DAILY AT     6:00PM 90 tablet 0   Blood Glucose Monitoring Suppl (ACCU-CHEK AVIVA PLUS) w/Device KIT Check glucose TID or as needed E11.9 1 kit 0   carvedilol (COREG) 3.125 MG tablet TAKE 1 TABLET TWICE DAILY  WITH MEALS 180 tablet 0   clopidogrel (PLAVIX) 75 MG tablet TAKE 1 TABLET DAILY 90 tablet 1   Coenzyme Q10 (COQ10) 100 MG CAPS Take 200 mg by mouth daily.     ferrous sulfate 325 (65 FE) MG tablet Take 325 mg by mouth daily.     glucose blood test strip Use as instructed 100 each 12   hydrochlorothiazide (HYDRODIURIL) 25 MG tablet Take 1 tablet (25 mg total) by mouth daily. 90 tablet 1    irbesartan (AVAPRO) 300 MG tablet TAKE 1 TABLET DAILY 90 tablet 0   metFORMIN (GLUCOPHAGE-XR) 500 MG 24 hr tablet TAKE 2 TABLETS DAILY AFTER SUPPER 180 tablet 3   Multiple Vitamins-Minerals (MULTIVITAMIN WITH MINERALS) tablet Take 1 tablet by mouth daily.     pantoprazole (PROTONIX) 40 MG tablet TAKE 1 TABLET TWICE DAILY  BEFORE MEALS (Patient taking differently: 40 mg daily.) 180 tablet 1   Propylene Glycol, PF, (SYSTANE COMPLETE PF) 0.6 % SOLN Place 1 drop into both eyes daily as needed (dry eyes).     sildenafil (REVATIO) 20 MG tablet Take 1-5 tablets by mouth daily as needed. 100 tablet 0   Undecylenic Ac-Zn Undecylenate (FUNGI-NAIL TOE & FOOT EX) Apply 1 application  topically daily as needed (fungus).     vitamin B-12 (CYANOCOBALAMIN) 1000 MCG tablet Take 1,000 mcg by mouth daily.     Whey Protein POWD Take 2 Scoops by mouth daily.     nitroGLYCERIN (NITROSTAT) 0.4 MG SL tablet Place 1 tablet (0.4 mg total) under the tongue every 5 (five) minutes as needed for chest pain. (Patient not taking: Reported on 07/28/2023) 100 tablet 1   No current facility-administered medications on file prior to visit.    Allergies  Allergen Reactions   Aspirin Hives, Swelling and Other (See Comments)    Angioedema   Oxycodone Nausea Only  Contrast Media [Iodinated Contrast Media] Hives, Itching, Rash and Other (See Comments)    Developed hives despite pre-treatment   Norvasc [Amlodipine Besylate] Other (See Comments) and Cough    09/12/2013 gingival hyperplasia , Dr Lucky Cowboy ,DDS    Past Medical History:  Diagnosis Date   Adenomatous colon polyp 09/07/2004   Amblyopia    Anemia    Arthritis    CAD (coronary artery disease)    cardiac CT with a 50% LAD lesion in 2007   Diverticulosis    DM2 (diabetes mellitus, type 2) (HCC)    Elevated LFTs    secondary to labetalol   Esophageal stricture 2003   External hemorrhoids 2012   lanced by Dr Zachery Dakins   Family history of anesthesia complication     "sister had PONV"   GERD (gastroesophageal reflux disease)    esophageal stricture, pmh   Hiatal hernia 01/04/2000   History of kidney stones 2022   HLD (hyperlipidemia)    HTN (hypertension)    Hypopotassemia     Past Surgical History:  Procedure Laterality Date   BIOPSY  06/05/2021   Procedure: BIOPSY;  Surgeon: Iva Boop, MD;  Location: [facility] ENDOSCOPY;  Service: Endoscopy;;   BIOPSY  09/02/2021   Procedure: BIOPSY;  Surgeon: Lemar Lofty., MD;  Location: [facility] ENDOSCOPY;  Service: Gastroenterology;;   COLONOSCOPY  06/27/2004   tics , ADENOMATOUS polyps. F/U due 2011   COLONOSCOPY  06/27/2010   Tics   COLONOSCOPY WITH PROPOFOL N/A 09/02/2021   Procedure: COLONOSCOPY WITH PROPOFOL;  Surgeon: Meridee Score Netty Starring., MD;  Location: [facility] ENDOSCOPY;  Service: Gastroenterology;  Laterality: N/A;   COLONOSCOPY WITH PROPOFOL N/A 04/14/2022   Procedure: COLONOSCOPY WITH PROPOFOL;  Surgeon: Meridee Score Netty Starring., MD;  Location: [facility] ENDOSCOPY;  Service: Gastroenterology;  Laterality: N/A;   COLONOSCOPY WITH PROPOFOL N/A 10/13/2022   Procedure: COLONOSCOPY WITH PROPOFOL;  Surgeon: Meridee Score Netty Starring., MD;  Location: [facility] ENDOSCOPY;  Service: Gastroenterology;  Laterality: N/A;   CORONARY ANGIOPLASTY WITH STENT PLACEMENT  04/01/2014   "2"   ENDOSCOPIC MUCOSAL RESECTION  09/02/2021   Procedure: ENDOSCOPIC MUCOSAL RESECTION;  Surgeon: Meridee Score Netty Starring., MD;  Location: [facility] ENDOSCOPY;  Service: Gastroenterology;;   ENDOSCOPIC MUCOSAL RESECTION N/A 04/14/2022   Procedure: ENDOSCOPIC MUCOSAL RESECTION;  Surgeon: Lemar Lofty., MD;  Location: [facility] ENDOSCOPY;  Service: Gastroenterology;  Laterality: N/A;   ESOPHAGEAL BRUSHING  04/14/2022   Procedure: ESOPHAGEAL BRUSHING;  Surgeon: Lemar Lofty., MD;  Location: [facility] ENDOSCOPY;  Service: Gastroenterology;;   ESOPHAGEAL DILATION  06/27/2001   ESOPHAGOGASTRODUODENOSCOPY  06/27/2001   ERD, stricture dilated    ESOPHAGOGASTRODUODENOSCOPY (EGD) WITH PROPOFOL N/A 06/05/2021   Procedure: ESOPHAGOGASTRODUODENOSCOPY (EGD) WITH PROPOFOL;  Surgeon: Iva Boop, MD;  Location: [facility] ENDOSCOPY;  Service: Endoscopy;  Laterality: N/A;   ESOPHAGOGASTRODUODENOSCOPY (EGD) WITH PROPOFOL N/A 09/02/2021   Procedure: ESOPHAGOGASTRODUODENOSCOPY (EGD) WITH PROPOFOL;  Surgeon: Meridee Score Netty Starring., MD;  Location[facility] ENDOSCOPY;  Service: Gastroenterology;  Laterality: N/A;   ESOPHAGOGASTRODUODENOSCOPY (EGD) WITH PROPOFOL N/A 04/14/2022   Procedure: ESOPHAGOGASTRODUODENOSCOPY (EGD) WITH PROPOFOL;  Surgeon: Meridee Score Netty Starring., MD;  Location: [facility] ENDOSCOPY;  Service: Gastroenterology;  Laterality: N/A;   EXCISIONAL HEMORRHOIDECTOMY  ~ 2008   FULL THICKNESS RESECTION (FTRD) N/A 10/13/2022   Procedure: FULL THICKNESS RESECTION (FTRD);  Surgeon: Lemar Lofty., MD;  Location: [facility] ENDOSCOPY;  Service: Gastroenterology;  Laterality: N/A;   HEMOSTASIS CLIP PLACEMENT  09/02/2021   Procedure: HEMOSTASIS CLIP PLACEMENT;  Surgeon: Meridee Score Netty Starring., MD;  Location: [facility] ENDOSCOPY;  Service: Gastroenterology;;   HEMOSTASIS CLIP PLACEMENT  10/13/2022   Procedure: HEMOSTASIS CLIP PLACEMENT;  Surgeon: Lemar Lofty., MD;  Location: [facility] ENDOSCOPY;  Service: Gastroenterology;;   HEMOSTASIS CONTROL  04/14/2022   Procedure: HEMOSTASIS CONTROL;  Surgeon: Lemar Lofty., MD;  Location: [facility] ENDOSCOPY;  Service: Gastroenterology;;   HEMOSTASIS CONTROL  10/13/2022   Procedure: HEMOSTASIS CONTROL;  Surgeon: Lemar Lofty., MD;  Location: [facility] ENDOSCOPY;  Service: Gastroenterology;;   HOT HEMOSTASIS N/A 09/02/2021   Procedure: HOT HEMOSTASIS (ARGON PLASMA COAGULATION/BICAP);  Surgeon: Lemar Lofty., MD;  Location: [facility] ENDOSCOPY;  Service: Gastroenterology;  Laterality: N/A;   HOT HEMOSTASIS N/A 04/14/2022   Procedure: HOT HEMOSTASIS (ARGON PLASMA COAGULATION/BICAP);  Surgeon: Lemar Lofty.,  MD;  Location: [facility] ENDOSCOPY;  Service: Gastroenterology;  Laterality: N/A;   LEFT HEART CATHETERIZATION WITH CORONARY ANGIOGRAM N/A 03/26/2014   Procedure: LEFT HEART CATHETERIZATION WITH CORONARY ANGIOGRAM;  Surgeon: Ariston Grandison M [place], MD;  Location: [facility] CATH LAB;  Service: Cardiovascular;  Laterality: N/A;   PARTIAL COLECTOMY  01/05/2023   R.  polyp-large   PERCUTANEOUS CORONARY STENT INTERVENTION (PCI-S) N/A 04/01/2014   Procedure: PERCUTANEOUS CORONARY STENT INTERVENTION (PCI-S);  Surgeon: Nadean Montanaro M [place], MD;  Location: [facility] CATH LAB;  Service: Cardiovascular;  Laterality: N/A;   POLYPECTOMY  10/13/2022   Procedure: POLYPECTOMY;  Surgeon: Meridee Score Netty Starring., MD;  Location: [facility] ENDOSCOPY;  Service: Gastroenterology;;   POSTERIOR LAMINECTOMY / DECOMPRESSION LUMBAR SPINE  12/25/2009   L4 , Dr Newell Coral   SCLEROTHERAPY  10/13/2022   Procedure: SCLEROTHERAPY;  Surgeon: Lemar Lofty., MD;  Location: [facility] ENDOSCOPY;  Service: Gastroenterology;;   SHOULDER ARTHROSCOPY WITH ROTATOR CUFF REPAIR AND OPEN BICEPS TENODESIS Left 02/04/2019   Procedure: LEFT SHOULDER ARTHROSCOPY, BICEPS TENODESIS, MINI OPEN ROTATOR CUFF TEAR REPAIR;  Surgeon: Cammy Copa, MD;  Location: [facility];  Service: Orthopedics;  Laterality: Left;   SUBMUCOSAL INJECTION  09/02/2021   Procedure: SUBMUCOSAL INJECTION;  Surgeon: Meridee Score Netty Starring., MD;  Location: [facility] ENDOSCOPY;  Service: Gastroenterology;;   SUBMUCOSAL LIFTING INJECTION  04/14/2022   Procedure: SUBMUCOSAL LIFTING INJECTION;  Surgeon: Lemar Lofty., MD;  Location: [facility] ENDOSCOPY;  Service: Gastroenterology;;   TOTAL KNEE ARTHROPLASTY Right 05/11/2021   Procedure: TOTAL KNEE ARTHROPLASTY;  Surgeon: Durene Romans, MD;  Location: [facility] ORS;  Service: Orthopedics;  Laterality: Right;    Social History   Tobacco Use  Smoking Status Never  Smokeless Tobacco Never    Social History   Substance and Sexual Activity  Alcohol Use  Not Currently   Comment: social    Family History  Problem Relation Age of Onset   Heart attack Mother 77   Stroke Mother 38   Prostate cancer Father    Diabetes Father    Heart disease Father 59       CBAG    Heart disease Sister    Diabetes Brother    Heart attack Brother    Breast cancer Maternal Aunt    Heart attack Maternal Grandfather 85   Diabetes Paternal Grandmother    Stroke Paternal Grandfather 79   Stomach cancer Neg Hx    Rectal cancer Neg Hx    Esophageal cancer Neg Hx    Colon cancer Neg Hx    Inflammatory bowel disease Neg Hx    Liver disease Neg Hx    Pancreatic cancer Neg Hx     Review of Systems: As noted in HPI. All other systems were reviewed and are negative.  Physical  Exam: BP 120/72 (BP Location: Left Arm, Patient Position: Sitting, Cuff Size: Normal)   Pulse (!) 59   Ht 5\' 9"  (1.753 m)   Wt 179 lb 6.4 oz (81.4 kg)   SpO2 94%   BMI 26.49 kg/m   GENERAL:  Well appearing WM in NAD HEENT:  normal. NECK:  No jugular venous distention, no bruits LUNGS:  Clear to auscultation bilaterally CHEST:  Unremarkable HEART:  RRR,  PMI not displaced or sustained,S1 and S2 within normal limits, no S3, no S4: no clicks, no rubs, no murmurs EXT:  2 + pulses throughout, no edema,  SKIN:  Warm and dry.  No rashes NEURO:  Alert and oriented x 3. Cranial nerves II through XII intact. PSYCH:  Cognitively intact      LABORATORY DATA: Lab Results  Component Value Date   WBC 10.3 01/11/2023   HGB 11.3 (L) 01/11/2023   HCT 32.8 (L) 01/11/2023   PLT 239 01/11/2023   GLUCOSE 151 (H) 01/09/2023   CHOL 110 09/02/2020   TRIG 100.0 09/02/2020   HDL 35.80 (L) 09/02/2020   LDLCALC 54 09/02/2020   ALT 17 09/14/2022   AST 14 09/14/2022   NA 135 01/09/2023   K 3.9 01/11/2023   CL 96 (L) 01/09/2023   CREATININE 0.90 01/11/2023   BUN 22 01/09/2023   CO2 30 01/09/2023   TSH 1.04 03/15/2022   PSA 1.84 09/14/2022   INR 0.9 08/27/2021   HGBA1C 7.2 (A)  12/15/2022   MICROALBUR 0.9 03/15/2022    Myoview 09/23/20: Study Highlights    The left ventricular ejection fraction is mildly decreased (45-54%). Nuclear stress EF: 49%. Mid anterior wall hypokinesis. There was no ST segment deviation noted during stress. Defect 1: There is a medium defect of severe severity present in the mid anterior location. Findings consistent with prior myocardial infarction. This is an intermediate risk study. Old infarct pattern noted in the mid inferior wall distribution. Prior LAD stent placed. No ischemia identified.   Donato Schultz, MD   Assessment / Plan: 1. Coronary disease s/p DES stenting of the LAD and first diagonal in October 2015. Normal FFR of OM2. Myoview in March 2022 was normal. He is asymptomatic. Continue Plavix since ASA allergic. Continue statin and Coreg.  2. Hyperlipidemia.  On high dose lipitor. Last LDL was 3 years ago. Needs updated lipid panel. Is due to have labs checked by PCP this month. Needs to make sure lipids are ordered.   3. Diabetes mellitus type 2. On metformin. Followed by primary care.  A1c 7.2 %.  Encourage low carb diet.  4. Hypertension. BP is well controlled. On Avapro, Coreg, HCT. Apparent intolerance to amlodipine.   5. History of Contrast dye allergy with hives- despite pretreatment.  I will follow up in one year

## 2023-07-28 ENCOUNTER — Ambulatory Visit: Payer: Medicare Other | Attending: Cardiology | Admitting: Cardiology

## 2023-07-28 ENCOUNTER — Encounter: Payer: Self-pay | Admitting: Cardiology

## 2023-07-28 VITALS — BP 120/72 | HR 59 | Ht 69.0 in | Wt 179.4 lb

## 2023-07-28 DIAGNOSIS — I10 Essential (primary) hypertension: Secondary | ICD-10-CM | POA: Insufficient documentation

## 2023-07-28 DIAGNOSIS — E119 Type 2 diabetes mellitus without complications: Secondary | ICD-10-CM | POA: Diagnosis not present

## 2023-07-28 DIAGNOSIS — E7800 Pure hypercholesterolemia, unspecified: Secondary | ICD-10-CM | POA: Insufficient documentation

## 2023-07-28 DIAGNOSIS — I25118 Atherosclerotic heart disease of native coronary artery with other forms of angina pectoris: Secondary | ICD-10-CM | POA: Insufficient documentation

## 2023-07-28 MED ORDER — CARVEDILOL 3.125 MG PO TABS: ORAL_TABLET | ORAL | 3 refills | Status: DC

## 2023-07-28 MED ORDER — ATORVASTATIN CALCIUM 80 MG PO TABS: ORAL_TABLET | ORAL | 3 refills | Status: DC

## 2023-07-28 MED ORDER — IRBESARTAN 300 MG PO TABS: 300.0000 mg | ORAL_TABLET | Freq: Every day | ORAL | 3 refills | Status: DC

## 2023-07-28 MED ORDER — HYDROCHLOROTHIAZIDE 25 MG PO TABS: 25.0000 mg | ORAL_TABLET | Freq: Every day | ORAL | 3 refills | Status: DC

## 2023-07-28 NOTE — Patient Instructions (Signed)

## 2023-08-08 ENCOUNTER — Other Ambulatory Visit: Payer: Self-pay

## 2023-08-08 MED ORDER — CLOPIDOGREL BISULFATE 75 MG PO TABS: 75.0000 mg | ORAL_TABLET | Freq: Every day | ORAL | 3 refills | Status: DC

## 2023-08-08 MED ORDER — ATORVASTATIN CALCIUM 80 MG PO TABS: ORAL_TABLET | ORAL | 3 refills | Status: DC

## 2023-08-08 MED ORDER — PANTOPRAZOLE SODIUM 40 MG PO TBEC: 40.0000 mg | DELAYED_RELEASE_TABLET | Freq: Two times a day (BID) | ORAL | 3 refills | Status: DC

## 2023-08-08 MED ORDER — CARVEDILOL 3.125 MG PO TABS: ORAL_TABLET | ORAL | 3 refills | Status: DC

## 2023-08-08 MED ORDER — HYDROCHLOROTHIAZIDE 25 MG PO TABS: 25.0000 mg | ORAL_TABLET | Freq: Every day | ORAL | 3 refills | Status: DC

## 2023-08-08 MED ORDER — IRBESARTAN 300 MG PO TABS: 300.0000 mg | ORAL_TABLET | Freq: Every day | ORAL | 3 refills | Status: DC

## 2023-08-09 ENCOUNTER — Ambulatory Visit: Payer: Medicare Other | Admitting: Family Medicine

## 2023-08-09 ENCOUNTER — Encounter: Payer: Self-pay | Admitting: Family Medicine

## 2023-08-09 VITALS — BP 118/74 | HR 62 | Temp 97.2°F | Ht 69.0 in | Wt 177.8 lb

## 2023-08-09 DIAGNOSIS — I152 Hypertension secondary to endocrine disorders: Secondary | ICD-10-CM | POA: Diagnosis not present

## 2023-08-09 DIAGNOSIS — E1165 Type 2 diabetes mellitus with hyperglycemia: Secondary | ICD-10-CM | POA: Diagnosis not present

## 2023-08-09 DIAGNOSIS — E785 Hyperlipidemia, unspecified: Secondary | ICD-10-CM | POA: Diagnosis not present

## 2023-08-09 DIAGNOSIS — Z125 Encounter for screening for malignant neoplasm of prostate: Secondary | ICD-10-CM | POA: Diagnosis not present

## 2023-08-09 DIAGNOSIS — D509 Iron deficiency anemia, unspecified: Secondary | ICD-10-CM | POA: Diagnosis not present

## 2023-08-09 DIAGNOSIS — E1159 Type 2 diabetes mellitus with other circulatory complications: Secondary | ICD-10-CM

## 2023-08-09 DIAGNOSIS — E1169 Type 2 diabetes mellitus with other specified complication: Secondary | ICD-10-CM

## 2023-08-09 DIAGNOSIS — Z7984 Long term (current) use of oral hypoglycemic drugs: Secondary | ICD-10-CM

## 2023-08-09 LAB — COMPREHENSIVE METABOLIC PANEL
ALT: 22 U/L (ref 0–53)
AST: 15 U/L (ref 0–37)
Albumin: 4.4 g/dL (ref 3.5–5.2)
Alkaline Phosphatase: 93 U/L (ref 39–117)
BUN: 17 mg/dL (ref 6–23)
CO2: 28 meq/L (ref 19–32)
Calcium: 9.3 mg/dL (ref 8.4–10.5)
Chloride: 100 meq/L (ref 96–112)
Creatinine, Ser: 0.99 mg/dL (ref 0.40–1.50)
GFR: 75.55 mL/min (ref >60.00)
Glucose, Bld: 233 mg/dL — ABNORMAL HIGH (ref 70–99)
Potassium: 3.8 meq/L (ref 3.5–5.1)
Sodium: 140 meq/L (ref 135–145)
Total Bilirubin: 0.8 mg/dL (ref 0.2–1.2)
Total Protein: 7 g/dL (ref 6.0–8.3)

## 2023-08-09 LAB — HEMOGLOBIN A1C: Hgb A1c MFr Bld: 10 % — ABNORMAL HIGH (ref 4.6–6.5)

## 2023-08-09 LAB — LIPID PANEL
Cholesterol: 130 mg/dL (ref 0–200)
HDL: 37.4 mg/dL — ABNORMAL LOW (ref >39.00)
LDL Cholesterol: 48 mg/dL (ref 0–99)
NonHDL: 93.02
Total CHOL/HDL Ratio: 3
Triglycerides: 223 mg/dL — ABNORMAL HIGH (ref 0.0–149.0)
VLDL: 44.6 mg/dL — ABNORMAL HIGH (ref 0.0–40.0)

## 2023-08-09 LAB — TSH: TSH: 1.33 u[IU]/mL (ref 0.35–5.50)

## 2023-08-09 LAB — CBC
HCT: 45.4 % (ref 39.0–52.0)
Hemoglobin: 15.2 g/dL (ref 13.0–17.0)
MCHC: 33.6 g/dL (ref 30.0–36.0)
MCV: 97.9 fL (ref 78.0–100.0)
Platelets: 218 10*3/uL (ref 150.0–400.0)
RBC: 4.64 Mil/uL (ref 4.22–5.81)
RDW: 13.7 % (ref 11.5–15.5)
WBC: 9.6 10*3/uL (ref 4.0–10.5)

## 2023-08-09 LAB — PSA, MEDICARE: PSA: 2.16 ng/mL (ref 0.10–4.00)

## 2023-08-09 MED ORDER — METFORMIN HCL ER 500 MG PO TB24: ORAL_TABLET | ORAL | 3 refills | Status: DC

## 2023-08-09 NOTE — Assessment & Plan Note (Signed)
Check CBC and iron panel.

## 2023-08-09 NOTE — Patient Instructions (Signed)
It was very nice to see you today!  We will check blood work today.   Return in about 6 months (around 02/06/2024) for Follow Up.   Take care, Dr Jimmey Ralph  PLEASE NOTE:  If you had any lab tests, please let us know if you have not heard back within a few days. You may see your results on mychart before we have a chance to review them but we will give you a call once they are reviewed by [place].   If we ordered any referrals today, please let us know if you have not heard from their office within the next week.   If you had any urgent prescriptions sent in today, please check with the pharmacy within an hour of our visit to make sure the prescription was transmitted appropriately.   Please try these tips to maintain a healthy lifestyle:  Eat at least 3 REAL meals and 1-2 snacks per day.  Aim for no more than 5 hours between eating.  If you eat breakfast, please do so within one hour of getting up.   Each meal should contain half fruits/vegetables, one quarter protein, and one quarter carbs (no bigger than a computer mouse)  Cut down on sweet beverages. This includes juice, soda, and sweet tea.   Drink at least 1 glass of water with each meal and aim for at least 8 glasses per day  Exercise at least 150 minutes every week.    Preventive Care 28 Years and Older, Male Preventive care refers to lifestyle choices and visits with your health care provider that can promote health and wellness. Preventive care visits are also called wellness exams. What can I expect for my preventive care visit? Counseling During your preventive care visit, your health care provider may ask about your: Medical history, including: Past medical problems. Family medical history. History of falls. Current health, including: Emotional well-being. Home life and relationship well-being. Sexual activity. Memory and ability to understand (cognition). Lifestyle, including: Alcohol, nicotine or tobacco, and drug  use. Access to firearms. Diet, exercise, and sleep habits. Work and work Astronomer. Sunscreen use. Safety issues such as seatbelt and bike helmet use. Physical exam Your health care provider will check your: Height and weight. These may be used to calculate your BMI (body mass index). BMI is a measurement that tells if you are at a healthy weight. Waist circumference. This measures the distance around your waistline. This measurement also tells if you are at a healthy weight and may help predict your risk of certain diseases, such as type 2 diabetes and high blood pressure. Heart rate and blood pressure. Body temperature. Skin for abnormal spots. What immunizations do I need?  Vaccines are usually given at various ages, according to a schedule. Your health care provider will recommend vaccines for you based on your age, medical history, and lifestyle or other factors, such as travel or where you work. What tests do I need? Screening Your health care provider may recommend screening tests for certain conditions. This may include: Lipid and cholesterol levels. Diabetes screening. This is done by checking your blood sugar (glucose) after you have not eaten for a while (fasting). Hepatitis C test. Hepatitis B test. HIV (human immunodeficiency virus) test. STI (sexually transmitted infection) testing, if you are at risk. Lung cancer screening. Colorectal cancer screening. Prostate cancer screening. Abdominal aortic aneurysm (AAA) screening. You may need this if you are a current or former smoker. Talk with your health care provider about your  test results, treatment options, and if necessary, the need for more tests. Follow these instructions at home: Eating and drinking  Eat a diet that includes fresh fruits and vegetables, whole grains, lean protein, and low-fat dairy products. Limit your intake of foods with high amounts of sugar, saturated fats, and salt. Take vitamin and mineral  supplements as recommended by your health care provider. Do not drink alcohol if your health care provider tells you not to drink. If you drink alcohol: Limit how much you have to 0-2 drinks a day. Know how much alcohol is in your drink. In the [place] one drink equals one 12 oz bottle of beer (355 mL), one 5 oz glass of wine (148 mL), or one 1 oz glass of hard liquor (44 mL). Lifestyle Brush your teeth every morning and night with fluoride toothpaste. Floss one time each day. Exercise for at least 30 minutes 5 or more days each week. Do not use any products that contain nicotine or tobacco. These products include cigarettes, chewing tobacco, and vaping devices, such as e-cigarettes. If you need help quitting, ask your health care provider. Do not use drugs. If you are sexually active, practice safe sex. Use a condom or other form of protection to prevent STIs. Take aspirin only as told by your health care provider. Make sure that you understand how much to take and what form to take. Work with your health care provider to find out whether it is safe and beneficial for you to take aspirin daily. Ask your health care provider if you need to take a cholesterol-lowering medicine (statin). Find healthy ways to manage stress, such as: Meditation, yoga, or listening to music. Journaling. Talking to a trusted person. Spending time with friends and family. Safety Always wear your seat belt while driving or riding in a vehicle. Do not drive: If you have been drinking alcohol. Do not ride with someone who has been drinking. When you are tired or distracted. While texting. If you have been using any mind-altering substances or drugs. Wear a helmet and other protective equipment during sports activities. If you have firearms in your house, make sure you follow all gun safety procedures. Minimize exposure to UV radiation to reduce your risk of skin cancer. What's next? Visit your health care provider  once a year for an annual wellness visit. Ask your health care provider how often you should have your eyes and teeth checked. Stay up to date on all vaccines. This information is not intended to replace advice given to you by your health care provider. Make sure you discuss any questions you have with your health care provider. Document Revised: 12/09/2020 Document Reviewed: 12/09/2020 Elsevier Patient Education  2024 [employer].

## 2023-08-09 NOTE — Assessment & Plan Note (Signed)
Stable on Lipitor 80 mg daily.  Check lipids today.

## 2023-08-09 NOTE — Assessment & Plan Note (Signed)
Blood pressure at goal today on current regimen per cardiology with carvedilol 3.125 mg twice daily, HCTZ 25 mg daily, irbesartan 300 mg daily

## 2023-08-09 NOTE — Assessment & Plan Note (Addendum)
Check A1c. Discussed lifestyle modifications. He is on metformin 1000 mg daily.  May consider increasing dose or addition of GLP-1 or SGLT2 depending on results of today's A1c.

## 2023-08-09 NOTE — Progress Notes (Signed)
[name] [name] is a 73 y.o. male who presents today for an office visit.  Assessment/Plan:  Chronic Problems Addressed Today: T2DM (type 2 diabetes mellitus) (HCC) Check A1c. Discussed lifestyle modifications. He is on metformin 1000 mg daily.  May consider increasing dose or addition of GLP-1 or SGLT2 depending on results of today's A1c.  Hyperlipidemia associated with type 2 diabetes mellitus (HCC) Stable on Lipitor 80 mg daily.  Check lipids today.  Hypertension associated with diabetes (HCC) Blood pressure at goal today on current regimen per cardiology with carvedilol 3.125 mg twice daily, HCTZ 25 mg daily, irbesartan 300 mg daily  IDA (iron deficiency anemia) Check CBC and iron panel.  Preventative Healthcare Check labs. He can get shingles vaccine at the pharmacy. Due for colonoscopy in 2029. UpToDate on other vaccines.     Subjective:  HPI:  See Assessment / plan for status of chronic conditions.  Patient here for annual follow up. He has no acute concerns today.  PMH:  The following were reviewed and entered/updated in epic: Past Medical History:  Diagnosis Date   Adenomatous colon polyp 09/07/2004   Amblyopia    Anemia    Arthritis    CAD (coronary artery disease)    cardiac CT with a 50% LAD lesion in 2007   Diverticulosis    DM2 (diabetes mellitus, type 2) (HCC)    Elevated LFTs    secondary to labetalol   Esophageal stricture 2003   External hemorrhoids 2012   lanced by Dr Zachery Dakins   Family history of anesthesia complication    "sister had PONV"   GERD (gastroesophageal reflux disease)    esophageal stricture, pmh   Hiatal hernia 01/04/2000   History of kidney stones 2022   HLD (hyperlipidemia)    HTN (hypertension)    Hypopotassemia    Patient Active Problem List   Diagnosis Date Noted   Neoplasm of ascending colon 01/05/2023   IDA (iron deficiency anemia) 08/31/2021   Abnormal colonoscopy 08/29/2021   Ascending colon mass - recurrent  08/29/2021   Hx of adenomatous colonic polyps 08/29/2021   PUD (peptic ulcer disease) 08/10/2021   S/P total knee arthroplasty, 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   Nephrolithiasis 03/05/2020   Labral tear of long head of biceps tendon, left, sequela    Complete tear of left rotator cuff    Antiplatelet or antithrombotic long-term use 01/17/2017   Diverticulosis of colon 04/25/2011   Adenomatous polyp of ascending colon s/p colectomy 01/05/2023 03/17/2011   LOW BACK PAIN, CHRONIC 12/13/2007   Hyperlipidemia associated with type 2 diabetes mellitus (HCC) 06/08/2007   T2DM (type 2 diabetes mellitus) (HCC) 02/27/2007   Hypertension associated with diabetes (HCC) 02/27/2007   Amblyopia 12/06/2006   GERD 12/06/2006   Past Surgical History:  Procedure Laterality Date   BIOPSY  06/05/2021   Procedure: BIOPSY;  Surgeon: Iva Boop, MD;  Location: [facility];  Service: Endoscopy;;   BIOPSY  09/02/2021   Procedure: BIOPSY;  Surgeon: Lemar Lofty., MD;  Location: [facility] ENDOSCOPY;  Service: Gastroenterology;;   COLONOSCOPY  06/27/2004   tics , ADENOMATOUS polyps. F/U due 2011   COLONOSCOPY  06/27/2010   Tics   COLONOSCOPY WITH PROPOFOL N/A 09/02/2021   Procedure: COLONOSCOPY WITH PROPOFOL;  Surgeon: Meridee Score Netty Starring., MD;  Location: [facility] ENDOSCOPY;  Service: Gastroenterology;  Laterality: N/A;   COLONOSCOPY WITH PROPOFOL N/A 04/14/2022   Procedure: COLONOSCOPY WITH PROPOFOL;  Surgeon: Meridee Score Netty Starring., MD;  Location: [facility] ENDOSCOPY;  Service: Gastroenterology;  Laterality:  N/A;   COLONOSCOPY WITH PROPOFOL N/A 10/13/2022   Procedure: COLONOSCOPY WITH PROPOFOL;  Surgeon: Meridee Score Netty Starring., MD;  Location: [facility] ENDOSCOPY;  Service: Gastroenterology;  Laterality: N/A;   CORONARY ANGIOPLASTY WITH STENT PLACEMENT  04/01/2014   "2"   ENDOSCOPIC MUCOSAL RESECTION  09/02/2021   Procedure: ENDOSCOPIC MUCOSAL RESECTION;  Surgeon: Meridee Score Netty Starring., MD;  Location: [facility] ENDOSCOPY;  Service:  Gastroenterology;;   ENDOSCOPIC MUCOSAL RESECTION N/A 04/14/2022   Procedure: ENDOSCOPIC MUCOSAL RESECTION;  Surgeon: Lemar Lofty., MD;  Location: [facility] ENDOSCOPY;  Service: Gastroenterology;  Laterality: N/A;   ESOPHAGEAL BRUSHING  04/14/2022   Procedure: ESOPHAGEAL BRUSHING;  Surgeon: Lemar Lofty., MD;  Location: [facility] ENDOSCOPY;  Service: Gastroenterology;;   ESOPHAGEAL DILATION  06/27/2001   ESOPHAGOGASTRODUODENOSCOPY  06/27/2001   ERD, stricture dilated   ESOPHAGOGASTRODUODENOSCOPY (EGD) WITH PROPOFOL N/A 06/05/2021   Procedure: ESOPHAGOGASTRODUODENOSCOPY (EGD) WITH PROPOFOL;  Surgeon: Iva Boop, MD;  Location: [facility] ENDOSCOPY;  Service: Endoscopy;  Laterality: N/A;   ESOPHAGOGASTRODUODENOSCOPY (EGD) WITH PROPOFOL N/A 09/02/2021   Procedure: ESOPHAGOGASTRODUODENOSCOPY (EGD) WITH PROPOFOL;  Surgeon: Meridee Score Netty Starring., MD;  Location: [facility] ENDOSCOPY;  Service: Gastroenterology;  Laterality: N/A;   ESOPHAGOGASTRODUODENOSCOPY (EGD) WITH PROPOFOL N/A 04/14/2022   Procedure: ESOPHAGOGASTRODUODENOSCOPY (EGD) WITH PROPOFOL;  Surgeon: Meridee Score Netty Starring., MD;  Location: [facility] ENDOSCOPY;  Service: Gastroenterology;  Laterality: N/A;   EXCISIONAL HEMORRHOIDECTOMY  ~ 2008   FULL THICKNESS RESECTION (FTRD) N/A 10/13/2022   Procedure: FULL THICKNESS RESECTION (FTRD);  Surgeon: Lemar Lofty., MD;  Location: [facility];  Service: Gastroenterology;  Laterality: N/A;   HEMOSTASIS CLIP PLACEMENT  09/02/2021   Procedure: HEMOSTASIS CLIP PLACEMENT;  Surgeon: Lemar Lofty., MD;  Location: [facility] ENDOSCOPY;  Service: Gastroenterology;;   HEMOSTASIS CLIP PLACEMENT  10/13/2022   Procedure: HEMOSTASIS CLIP PLACEMENT;  Surgeon: Lemar Lofty., MD;  Location: [facility] ENDOSCOPY;  Service: Gastroenterology;;   HEMOSTASIS CONTROL  04/14/2022   Procedure: HEMOSTASIS CONTROL;  Surgeon: Lemar Lofty., MD;  Location: [facility] ENDOSCOPY;  Service: Gastroenterology;;    HEMOSTASIS CONTROL  10/13/2022   Procedure: HEMOSTASIS CONTROL;  Surgeon: Lemar Lofty., MD;  Location: [facility] ENDOSCOPY;  Service: Gastroenterology;;   HOT HEMOSTASIS N/A 09/02/2021   Procedure: HOT HEMOSTASIS (ARGON PLASMA COAGULATION/BICAP);  Surgeon: Lemar Lofty., MD;  Location: [facility] ENDOSCOPY;  Service: Gastroenterology;  Laterality: N/A;   HOT HEMOSTASIS N/A 04/14/2022   Procedure: HOT HEMOSTASIS (ARGON PLASMA COAGULATION/BICAP);  Surgeon: Lemar Lofty., MD;  Location: [facility];  Service: Gastroenterology;  Laterality: N/A;   LEFT HEART CATHETERIZATION WITH CORONARY ANGIOGRAM N/A 03/26/2014   Procedure: LEFT HEART CATHETERIZATION WITH CORONARY ANGIOGRAM;  Surgeon: Peter M Swaziland, MD;  Location: [facility] CATH LAB;  Service: Cardiovascular;  Laterality: N/A;   PARTIAL COLECTOMY  01/05/2023   R.  polyp-large   PERCUTANEOUS CORONARY STENT INTERVENTION (PCI-S) N/A 04/01/2014   Procedure: PERCUTANEOUS CORONARY STENT INTERVENTION (PCI-S);  Surgeon: Peter M Swaziland, MD;  Location: [facility] CATH LAB;  Service: Cardiovascular;  Laterality: N/A;   POLYPECTOMY  10/13/2022   Procedure: POLYPECTOMY;  Surgeon: Meridee Score Netty Starring., MD;  Location: [facility] ENDOSCOPY;  Service: Gastroenterology;;   POSTERIOR LAMINECTOMY / DECOMPRESSION LUMBAR SPINE  12/25/2009   L4 , Dr Newell Coral   SCLEROTHERAPY  10/13/2022   Procedure: SCLEROTHERAPY;  Surgeon: Lemar Lofty., MD;  Location: [facility] ENDOS[facility];  Service: Gastroenterology;;   SHOULDER ARTHROSCOPY WITH ROTATOR CUFF REPAIR AND OPEN BICEPS TENODESIS Left 02/04/2019   Procedure: LEFT SHOULDER ARTHROSCOPY, BICEPS TENODESIS, MINI OPEN ROTATOR CUFF TEAR REPAIR;  Surgeon: Cammy Copa, MD;  Location: [facility];  Service: Orthopedics;  Laterality: Left;   SUBMUCOSAL INJECTION  09/02/2021   Procedure: SUBMUCOSAL INJECTION;  Surgeon: Meridee Score Netty Starring., MD;  Location: [facility] ENDOSCOPY;  Service: Gastroenterology;;   SUBMUCOSAL  LIFTING INJECTION  04/14/2022   Procedure: SUBMUCOSAL LIFTING INJECTION;  Surgeon: Lemar Lofty., MD;  Location: [facility];  Service: Gastroenterology;;   TOTAL KNEE ARTHROPLASTY Right 05/11/2021   Procedure: TOTAL KNEE ARTHROPLASTY;  Surgeon: Durene Romans, MD;  Location: [facility] ORS;  Service: Orthopedics;  Laterality: Right;    Family History  Problem Relation Age of Onset   Heart attack Mother 87   Stroke Mother 64   Prostate cancer Father    Diabetes Father    Heart disease Father 28       CBAG    Heart disease Sister    Diabetes Brother    Heart attack Brother    Breast cancer Maternal Aunt    Heart attack Maternal Grandfather 69   Diabetes Paternal Grandmother    Stroke Paternal Grandfather 40   Stomach cancer Neg Hx    Rectal cancer Neg Hx    Esophageal cancer Neg Hx    Colon cancer Neg Hx    Inflammatory bowel disease Neg Hx    Liver disease Neg Hx    Pancreatic cancer Neg Hx     Medications- reviewed and updated Current Outpatient Medications  Medication Sig Dispense Refill   Accu-Chek Softclix Lancets lancets 1 each by Other route 3 (three) times daily. 102 each 11   atorvastatin (LIPITOR) 80 MG tablet TAKE 1 TABLET DAILY AT     6:00PM 90 tablet 3   Blood Glucose Monitoring Suppl (ACCU-CHEK AVIVA PLUS) w/Device KIT Check glucose TID or as needed E11.9 1 kit 0   carvedilol (COREG) 3.125 MG tablet TAKE 1 TABLET TWICE DAILY  WITH MEALS 180 tablet 3   clopidogrel (PLAVIX) 75 MG tablet Take 1 tablet (75 mg total) by mouth daily. 90 tablet 3   Coenzyme Q10 (COQ10) 100 MG CAPS Take 200 mg by mouth daily.     ferrous sulfate 325 (65 FE) MG tablet Take 325 mg by mouth daily.     glucose blood test strip Use as instructed 100 each 12   hydrochlorothiazide (HYDRODIURIL) 25 MG tablet Take 1 tablet (25 mg total) by mouth daily. 90 tablet 3   irbesartan (AVAPRO) 300 MG tablet Take 1 tablet (300 mg total) by mouth daily. 90 tablet 3   Multiple Vitamins-Minerals  (MULTIVITAMIN WITH MINERALS) tablet Take 1 tablet by mouth daily.     nitroGLYCERIN (NITROSTAT) 0.4 MG SL tablet Place 1 tablet (0.4 mg total) under the tongue every 5 (five) minutes as needed for chest pain. 100 tablet 1   pantoprazole (PROTONIX) 40 MG tablet Take 1 tablet (40 mg total) by mouth 2 (two) times daily. 180 tablet 3   Propylene Glycol, PF, (SYSTANE COMPLETE PF) 0.6 % SOLN Place 1 drop into both eyes daily as needed (dry eyes).     sildenafil (REVATIO) 20 MG tablet Take 1-5 tablets by mouth daily as needed. 100 tablet 0   Undecylenic Ac-Zn Undecylenate (FUNGI-NAIL TOE & FOOT EX) Apply 1 application  topically daily as needed (fungus).     vitamin B-12 (CYANOCOBALAMIN) 1000 MCG tablet Take 1,000 mcg by mouth daily.     Whey Protein POWD Take 2 Scoops by mouth daily.     metFORMIN (GLUCOPHAGE-XR) 500 MG 24 hr tablet TAKE  2 TABLETS DAILY AFTER SUPPER 180 tablet 3   No current facility-administered medications for this visit.    Allergies-reviewed and updated Allergies  Allergen Reactions   Aspirin Hives, Swelling and Other (See Comments)    Angioedema   Oxycodone Nausea Only   Contrast Media [Iodinated Contrast Media] Hives, Itching, Rash and Other (See Comments)    Developed hives despite pre-treatment   Norvasc [Amlodipine Besylate] Other (See Comments) and Cough    09/12/2013 gingival hyperplasia , Dr Lucky Cowboy ,DDS    Social History   Socioeconomic History   Marital status: Single    Spouse name: Not on file   Number of children: 0   Years of education: 16   Highest education level: Bachelor's degree (e.g., BA, AB, BS)  Occupational History   Occupation: Retired   Tobacco Use   Smoking status: Never   Smokeless tobacco: Never  Vaping Use   Vaping status: Never Used  Substance and Sexual Activity   Alcohol use: Not Currently    Comment: social   Drug use: No   Sexual activity: Not Currently  Other Topics Concern   Not on file  Social History Narrative   Fun:  Water quality scientist.    Social Drivers of Corporate investment banker Strain: Low Risk  (08/06/2023)   Overall Financial Resource Strain (CARDIA)    Difficulty of Paying Living Expenses: Not hard at all  Food Insecurity: No Food Insecurity (08/06/2023)   Hunger Vital Sign    Worried About Running Out of Food in the Last Year: Never true    Ran Out of Food in the Last Year: Never true  Transportation Needs: No Transportation Needs (08/06/2023)   PRAPARE - Administrator, Civil Service (Medical): No    Lack of Transportation (Non-Medical): No  Physical Activity: Insufficiently Active (08/06/2023)   Exercise Vital Sign    Days of Exercise per Week: 3 days    Minutes of Exercise per Session: 40 min  Stress: No Stress Concern Present (08/06/2023)   [employer] of Occupational Health - Occupational Stress Questionnaire    Feeling of Stress : Not at all  Social Connections: Socially Isolated (08/06/2023)   Social Connection and Isolation Panel [NHANES]    Frequency of Communication with Friends and Family: More than three times a week    Frequency of Social Gatherings with Friends and Family: More than three times a week    Attends Religious Services: Never    Database administrator or Organizations: No    Attends Engineer, structural: Never    Marital Status: Never married          Objective:  Physical Exam: BP 118/74   Pulse 62   Temp (!) 97.2 F (36.2 C) (Temporal)   Ht 5\' 9"  (1.753 m)   Wt 177 lb 12.8 oz (80.6 kg)   SpO2 99%   BMI 26.26 kg/m   Gen: No acute distress, resting comfortably CV: Regular rate and rhythm with no murmurs appreciated Pulm: Normal work of breathing, clear to auscultation bilaterally with no crackles, wheezes, or rhonchi Neuro: Grossly normal, moves all extremities Psych: Normal affect and thought content      Tristan Proto M. Jimmey Ralph, MD 08/09/2023 10:06 AM

## 2023-08-10 LAB — MICROALBUMIN / CREATININE URINE RATIO
Creatinine,U: 103.7 mg/dL
Microalb Creat Ratio: 9.4 mg/g (ref 0.0–30.0)
Microalb, Ur: 1 mg/dL (ref 0.0–1.9)

## 2023-08-10 LAB — IRON,TIBC AND FERRITIN PANEL
%SAT: 42 % (ref 20–48)
Ferritin: 62 ng/mL (ref 24–380)
Iron: 137 ug/dL (ref 50–180)
TIBC: 325 ug/dL (ref 250–425)

## 2023-08-11 ENCOUNTER — Encounter: Payer: Self-pay | Admitting: Family Medicine

## 2023-08-11 NOTE — Progress Notes (Signed)
His sugar is up quite a bit since our last time.  The rest of his labs are all at goal but we need to work on getting his sugar lower.  Recommend he come in for office visit to discuss medication options.  I would like to see him back in 3 months to recheck.  We can recheck the rest of his labs in 1 year.

## 2023-08-14 NOTE — Telephone Encounter (Signed)
Spoke with patient; appt schedule

## 2023-08-18 ENCOUNTER — Encounter: Payer: Self-pay | Admitting: Family Medicine

## 2023-08-18 ENCOUNTER — Ambulatory Visit (INDEPENDENT_AMBULATORY_CARE_PROVIDER_SITE_OTHER): Payer: Medicare Other | Admitting: Family Medicine

## 2023-08-18 ENCOUNTER — Other Ambulatory Visit: Payer: Self-pay | Admitting: *Deleted

## 2023-08-18 ENCOUNTER — Telehealth: Payer: Self-pay | Admitting: *Deleted

## 2023-08-18 VITALS — BP 126/76 | HR 64 | Temp 98.0°F | Ht 69.0 in | Wt 178.2 lb

## 2023-08-18 DIAGNOSIS — E1165 Type 2 diabetes mellitus with hyperglycemia: Secondary | ICD-10-CM

## 2023-08-18 DIAGNOSIS — Z7984 Long term (current) use of oral hypoglycemic drugs: Secondary | ICD-10-CM

## 2023-08-18 MED ORDER — EMPAGLIFLOZIN 10 MG PO TABS: 10.0000 mg | ORAL_TABLET | Freq: Every day | ORAL | 5 refills | Status: DC

## 2023-08-18 MED ORDER — GLUCOSE BLOOD VI STRP: ORAL_STRIP | 12 refills | Status: AC

## 2023-08-18 MED ORDER — ACCU-CHEK SOFTCLIX LANCETS MISC: 1.0000 | Freq: Three times a day (TID) | 11 refills | Status: AC

## 2023-08-18 NOTE — Telephone Encounter (Signed)
Copied from CRM [id number]. Topic: Clinical - Prescription Issue >> Aug 18, 2023 11:00 AM Prudencio Pair wrote: Reason for CRM: Carmie Kanner, from [facility], called stating that they received prescriptions today for patient. She states they need new prescriptions for Accu-Chek Softclix Lancets & glucose blood test strip. They needs to have the diagnosis codes & frequency on them.    Rx send in today  [facility]

## 2023-08-18 NOTE — Assessment & Plan Note (Signed)
Had extensive discussion with patient regarding his recent A1c. Uncontrolled at 10. We discussed lifestyle modifications. He is on metformin 1000mg  daily. We also discussed lifestyle modifications including adding GLP1 agonist or SGLT2 inhibitor. We did discuss adding on Mounjaro however he would like to avoid GLP agonists for now. He is agreeable to SGLT2 inhibitor. We will add on jardiance 10 mg daily. We discussed potential side effects. We will recheck in 3 months. We can combine into synjardy at some point in the future if he does well.

## 2023-08-18 NOTE — Patient Instructions (Addendum)
It was very nice to see you today!  We need to work on getting your sugar lower.   Please start the jardiance.   Please continue to work on diet and exercise.   Let me know if you have any side effects.   Return in about 3 months (around 11/15/2023) for Follow Up.   Take care, Dr Jimmey Ralph  PLEASE NOTE:  If you had any lab tests, please let us know if you have not heard back within a few days. You may see your results on mychart before we have a chance to review them but we will give you a call once they are reviewed by [place].   If we ordered any referrals today, please let us know if you have not heard from their office within the next week.   If you had any urgent prescriptions sent in today, please check with the pharmacy within an hour of our visit to make sure the prescription was transmitted appropriately.   Please try these tips to maintain a healthy lifestyle:  Eat at least 3 REAL meals and 1-2 snacks per day.  Aim for no more than 5 hours between eating.  If you eat breakfast, please do so within one hour of getting up.   Each meal should contain half fruits/vegetables, one quarter protein, and one quarter carbs (no bigger than a computer mouse)  Cut down on sweet beverages. This includes juice, soda, and sweet tea.   Drink at least 1 glass of water with each meal and aim for at least 8 glasses per day  Exercise at least 150 minutes every week.

## 2023-08-18 NOTE — Progress Notes (Signed)
   [name] [name] is a 73 y.o. male who presents today for an office visit.  Assessment/Plan:  Chronic Problems Addressed Today: T2DM (type 2 diabetes mellitus) (HCC) Had extensive discussion with patient regarding his recent A1c. Uncontrolled at 10. We discussed lifestyle modifications. He is on metformin 1000mg  daily. We also discussed lifestyle modifications including adding GLP1 agonist or SGLT2 inhibitor. We did discuss adding on Mounjaro however he would like to avoid GLP agonists for now. He is agreeable to SGLT2 inhibitor. We will add on jardiance 10 mg daily. We discussed potential side effects. We will recheck in 3 months. We can combine into synjardy at some point in the future if he does well.      Subjective:  HPI:  See assessment / plan for status of chronic conditions..  Patient here today for follow-up.  Saw him last week ago for annual physical.  A1c at that time was 10.0.  He has been working on improving his diet. He has been consistent with his medications including metformin 1000mg  daily.        Objective:  Physical Exam: BP 126/76   Pulse 64   Temp 98 F (36.7 C) (Temporal)   Ht 5\' 9"  (1.753 m)   Wt 178 lb 3.2 oz (80.8 kg)   SpO2 98%   BMI 26.32 kg/m   Gen: No acute distress, resting comfortably Neuro: Grossly normal, moves all extremities Psych: Normal affect and thought content  Time Spent: 40 minutes of total time was spent on the date of the encounter performing the following actions: chart review prior to seeing the patient including recent labs, obtaining history, performing a medically necessary exam, counseling on the treatment plan, placing orders, and documenting in our EHR.        Katina Degree. Jimmey Ralph, MD 08/18/2023 10:38 AM

## 2023-08-22 ENCOUNTER — Other Ambulatory Visit: Payer: Self-pay | Admitting: *Deleted

## 2023-08-22 ENCOUNTER — Telehealth: Payer: Self-pay | Admitting: *Deleted

## 2023-08-22 MED ORDER — EMPAGLIFLOZIN 10 MG PO TABS: 10.0000 mg | ORAL_TABLET | Freq: Every day | ORAL | 1 refills | Status: DC

## 2023-08-22 NOTE — Telephone Encounter (Signed)
 Copied from CRM [id number]. Topic: Clinical - Prescription Issue >> Aug 22, 2023  9:18 AM Gurney Maxin H wrote: Reason for CRM: Patient states he would like a hard copy prescription for the empagliflozin (JARDIANCE) 10 MG TABS tablet for a 90 day supply so he can have prescription filled through his insurance, states current pharmacy he has to pay 2100 dollars and is too expensive. Please reach out to patient when and if available for pickup, thanks.  [place] [phone]   Rx printed and placed in front office to be pick up  Patient notified  [facility]

## 2023-11-15 ENCOUNTER — Ambulatory Visit (INDEPENDENT_AMBULATORY_CARE_PROVIDER_SITE_OTHER): Payer: Medicare Other | Admitting: Family Medicine

## 2023-11-15 ENCOUNTER — Encounter: Payer: Self-pay | Admitting: Family Medicine

## 2023-11-15 VITALS — BP 117/71 | HR 55 | Temp 97.3°F | Ht 69.0 in | Wt 169.0 lb

## 2023-11-15 DIAGNOSIS — E1159 Type 2 diabetes mellitus with other circulatory complications: Secondary | ICD-10-CM

## 2023-11-15 DIAGNOSIS — Z7984 Long term (current) use of oral hypoglycemic drugs: Secondary | ICD-10-CM | POA: Diagnosis not present

## 2023-11-15 DIAGNOSIS — I152 Hypertension secondary to endocrine disorders: Secondary | ICD-10-CM | POA: Diagnosis not present

## 2023-11-15 DIAGNOSIS — M6208 Separation of muscle (nontraumatic), other site: Secondary | ICD-10-CM | POA: Insufficient documentation

## 2023-11-15 DIAGNOSIS — E1165 Type 2 diabetes mellitus with hyperglycemia: Secondary | ICD-10-CM | POA: Diagnosis not present

## 2023-11-15 LAB — POCT GLYCOSYLATED HEMOGLOBIN (HGB A1C): Hemoglobin A1C: 7.3 % — AB (ref 4.0–5.6)

## 2023-11-15 NOTE — Progress Notes (Signed)
   [name] [name] is a 73 y.o. male who presents today for an office visit.  Assessment/Plan:  Chronic Problems Addressed Today: T2DM (type 2 diabetes mellitus) (HCC) A1c much better today at 7.3. He is doing well with Jardiance  10 mg daily and metformin  1000mg  daily. He is doing a great job with diet and exercise. We will check again in 3 months.   Hypertension associated with diabetes (HCC) Blood pressure at goal today.  He may be interested in decreasing dose of his HCTZ.  He will discuss this with his cardiology.  Will continue his current regimen for now per cardiology with Coreg  3.125 mg twice daily, HCTZ 25 mg daily, and irbesartan  300 mg daily.  Follow-up 3 months.  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  Not currently having any pain.  We did discuss core strengthening exercises.  He did have an umbilical hernia that was repaired last year.     Subjective:  HPI:  See A/P for status of chronic conditions.  Patient is here today for follow-up.  I saw him 3 months ago.  At that time A1c was 10.  We started Jardiance  10 mg daily and continue metformin  1000 mg daily. He did have some issues with cost of Jardiance  but was able to get started on this until about 2 months ago. He is doing well with this. He has also made significant changes with his diet and is cutting down on his sugar intake.        Objective:  Physical Exam: BP 117/71   Pulse (!) 55   Temp (!) 97.3 F (36.3 C) (Temporal)   Ht 5\' 9"  (1.753 m)   Wt 169 lb (76.7 kg)   SpO2 99%   BMI 24.96 kg/m   Wt Readings from Last 3 Encounters:  11/15/23 169 lb (76.7 kg)  08/18/23 178 lb 3.2 oz (80.8 kg)  08/09/23 177 lb 12.8 oz (80.6 kg)  Gen: No acute distress, resting comfortably HEENT: TMs clear bilaterally. CV: Regular rate and rhythm with no murmurs appreciated Pulm: Normal work of breathing, clear to auscultation bilaterally with no crackles, wheezes, or rhonchi Abdomen: Rectus diastases  noted. Neuro: Grossly normal, moves all extremities Psych: Normal affect and thought content      Marti Acebo M. Daneil Dunker, MD 11/15/2023 10:01 AM

## 2023-11-15 NOTE — Assessment & Plan Note (Signed)
 A1c much better today at 7.3. He is doing well with Jardiance  10 mg daily and metformin  1000mg  daily. He is doing a great job with diet and exercise. We will check again in 3 months.

## 2023-11-15 NOTE — Assessment & Plan Note (Signed)
 This has been a longstanding issue.  Not currently having any pain.  We did discuss core strengthening exercises.  He did have an umbilical hernia that was repaired last year.

## 2023-11-15 NOTE — Assessment & Plan Note (Signed)
 Blood pressure at goal today.  He may be interested in decreasing dose of his HCTZ.  He will discuss this with his cardiology.  Will continue his current regimen for now per cardiology with Coreg  3.125 mg twice daily, HCTZ 25 mg daily, and irbesartan  300 mg daily.  Follow-up 3 months.

## 2023-11-15 NOTE — Patient Instructions (Signed)
 It was very nice to see you today!  Your A1c today is 7.3. Keep up the great work!  We will see you back in 3 months. Come back sooner if needed.   Return in about 3 months (around 02/15/2024).   Take care, Dr Daneil Dunker  PLEASE NOTE:  If you had any lab tests, please let us  know if you have not heard back within a few days. You may see your results on mychart before we have a chance to review them but we will give you a call once they are reviewed by us .   If we ordered any referrals today, please let us  know if you have not heard from their office within the next week.   If you had any urgent prescriptions sent in today, please check with the pharmacy within an hour of our visit to make sure the prescription was transmitted appropriately.   Please try these tips to maintain a healthy lifestyle:  Eat at least 3 REAL meals and 1-2 snacks per day.  Aim for no more than 5 hours between eating.  If you eat breakfast, please do so within one hour of getting up.   Each meal should contain half fruits/vegetables, one quarter protein, and one quarter carbs (no bigger than a computer mouse)  Cut down on sweet beverages. This includes juice, soda, and sweet tea.   Drink at least 1 glass of water with each meal and aim for at least 8 glasses per day  Exercise at least 150 minutes every week.

## 2024-01-04 DIAGNOSIS — L821 Other seborrheic keratosis: Secondary | ICD-10-CM | POA: Diagnosis not present

## 2024-01-04 DIAGNOSIS — L578 Other skin changes due to chronic exposure to nonionizing radiation: Secondary | ICD-10-CM | POA: Diagnosis not present

## 2024-01-04 DIAGNOSIS — D225 Melanocytic nevi of trunk: Secondary | ICD-10-CM | POA: Diagnosis not present

## 2024-02-07 ENCOUNTER — Ambulatory Visit: Payer: Medicare Other | Admitting: Family Medicine

## 2024-02-07 ENCOUNTER — Encounter: Payer: Self-pay | Admitting: Family Medicine

## 2024-02-07 VITALS — BP 107/67 | HR 57 | Temp 97.0°F | Ht 69.0 in | Wt 168.4 lb

## 2024-02-07 DIAGNOSIS — E1159 Type 2 diabetes mellitus with other circulatory complications: Secondary | ICD-10-CM

## 2024-02-07 DIAGNOSIS — E1165 Type 2 diabetes mellitus with hyperglycemia: Secondary | ICD-10-CM | POA: Diagnosis not present

## 2024-02-07 DIAGNOSIS — Z7984 Long term (current) use of oral hypoglycemic drugs: Secondary | ICD-10-CM

## 2024-02-07 DIAGNOSIS — I152 Hypertension secondary to endocrine disorders: Secondary | ICD-10-CM | POA: Diagnosis not present

## 2024-02-07 DIAGNOSIS — J358 Other chronic diseases of tonsils and adenoids: Secondary | ICD-10-CM | POA: Diagnosis not present

## 2024-02-07 LAB — POCT GLYCOSYLATED HEMOGLOBIN (HGB A1C): Hemoglobin A1C: 7.3 % — AB (ref 4.0–5.6)

## 2024-02-07 MED ORDER — EMPAGLIFLOZIN 10 MG PO TABS: 10.0000 mg | ORAL_TABLET | Freq: Every day | ORAL | 1 refills | Status: DC

## 2024-02-07 NOTE — Assessment & Plan Note (Signed)
 A1c stable at 7.3. We will continue Jardiance  10 mg daily and metformin  XR 1000mg  daily. He is working on diet and exercise. Check again in 3 months.

## 2024-02-07 NOTE — Progress Notes (Signed)
   NORTH ESTERLINE is a 73 y.o. male who presents today for an office visit.  Assessment/Plan:  New/Acute Problems: Tonsillolith  No red flags. Reassured patient that this is benign.  Discussed conservative measures including salt water gargles.  Recommended against instrumentation or attempted physical removal at this point.  He will let us  know if symptoms worsen   Chronic Problems Addressed Today: T2DM (type 2 diabetes mellitus) (HCC) A1c stable at 7.3. We will continue Jardiance  10 mg daily and metformin  XR 1000mg  daily. He is working on diet and exercise. Check again in 3 months.   Hypertension associated with diabetes (HCC) Blood pressure at goal today on coreg  3.125 mg twice daily. Hydrochlorothiazide  25 mg daily, and irbesartan  300 mg daily.      Subjective:  HPI:  See assessment / plan for status of chronic conditions.    Discussed the use of AI scribe software for clinical note transcription with the patient, who gave verbal consent to proceed.  History of Present Illness ADONI GREENOUGH is a 73 year old male with diabetes who presents for a follow-up visit.  His blood sugar levels have been stable, with a recent A1c of 7.3. His current medication regimen includes Jardiance  and metformin . Due to cost concerns, he obtains Jardiance  from [place] and mentions a $500 deductible with his insurance. He is considering options to manage costs, including using a coupon or exploring alternative medications.  He has a white spot on his throat, which he suspects might be a tonsil stone or tonsillitis. It does not cause pain but he is concerned about potential bad odor. He has had it for a while and has not tried any treatments yet.  He inquires about the possibility of discontinuing hydrochlorothiazide  (HCTZ) since Jardiance  also acts as a diuretic. He notes that HCTZ has effectively managed his blood pressure.         Objective:  Physical Exam: BP 107/67   Pulse (!) 57   Temp  (!) 97 F (36.1 C) (Temporal)   Ht 5' 9 (1.753 m)   Wt 168 lb 6.4 oz (76.4 kg)   SpO2 98%   BMI 24.87 kg/m   Gen: No acute distress, resting comfortably HEENT: Tonsil stone noted on left tonsil.  No surrounding erythema. CV: Regular rate and rhythm with no murmurs appreciated Pulm: Normal work of breathing, clear to auscultation bilaterally with no crackles, wheezes, or rhonchi Neuro: Grossly normal, moves all extremities Psych: Normal affect and thought content      Jaycee Pelzer M. Kennyth, MD 02/07/2024 9:37 AM

## 2024-02-07 NOTE — Assessment & Plan Note (Signed)
 Blood pressure at goal today on coreg  3.125 mg twice daily. Hydrochlorothiazide  25 mg daily, and irbesartan  300 mg daily.

## 2024-02-07 NOTE — Patient Instructions (Signed)
 It was very nice to see you today!  VISIT SUMMARY: You came in for a follow-up visit to discuss your diabetes management, a white spot on your throat, and concerns about abdominal protrusion.  YOUR PLAN: TYPE 2 DIABETES MELLITUS: Your blood sugar levels are stable with a recent A1c of 7.3. You are currently taking Jardiance  and metformin . -Continue taking Jardiance  10 mg and Metformin  1000 mg orally daily. -Monitor your A1c every 3 months. -Discuss with your cardiologist about possibly discontinuing hydrochlorothiazide  (HCTZ) since Jardiance  also acts as a diuretic.  TONSIL STONE (TONSILLOLITHIASIS): You have a benign tonsil stone causing bad breath. -Gargle with warm salt water daily. -An ENT referral will be considered if symptoms worsen or become bothersome.  Return in about 3 months (around 05/09/2024) for Follow Up.   Take care, Dr Kennyth  PLEASE NOTE:  If you had any lab tests, please let us  know if you have not heard back within a few days. You may see your results on mychart before we have a chance to review them but we will give you a call once they are reviewed by us .   If we ordered any referrals today, please let us  know if you have not heard from their office within the next week.   If you had any urgent prescriptions sent in today, please check with the pharmacy within an hour of our visit to make sure the prescription was transmitted appropriately.   Please try these tips to maintain a healthy lifestyle:  Eat at least 3 REAL meals and 1-2 snacks per day.  Aim for no more than 5 hours between eating.  If you eat breakfast, please do so within one hour of getting up.   Each meal should contain half fruits/vegetables, one quarter protein, and one quarter carbs (no bigger than a computer mouse)  Cut down on sweet beverages. This includes juice, soda, and sweet tea.   Drink at least 1 glass of water with each meal and aim for at least 8 glasses per day  Exercise at  least 150 minutes every week.

## 2024-04-05 DIAGNOSIS — Z23 Encounter for immunization: Secondary | ICD-10-CM | POA: Diagnosis not present

## 2024-04-11 ENCOUNTER — Ambulatory Visit (INDEPENDENT_AMBULATORY_CARE_PROVIDER_SITE_OTHER): Payer: Medicare Other

## 2024-04-11 VITALS — BP 108/60 | HR 62 | Temp 98.2°F | Ht 68.0 in | Wt 169.0 lb

## 2024-04-11 DIAGNOSIS — Z0000 Encounter for general adult medical examination without abnormal findings: Secondary | ICD-10-CM | POA: Diagnosis not present

## 2024-04-11 NOTE — Progress Notes (Signed)
 Subjective:   [name] [name] is a 73 y.o. who presents for a Medicare Wellness preventive visit.  As a reminder, Annual Wellness Visits don't include a physical exam, and some assessments may be limited, especially if this visit is performed virtually. We may recommend an in-person follow-up visit with your provider if needed.  Visit Complete: In person    Persons Participating in Visit: Patient.  AWV Questionnaire: Yes: Patient Medicare AWV questionnaire was completed by the patient on 04/07/24; I have confirmed that all information answered by patient is correct and no changes since this date.  Cardiac Risk Factors include: advanced age (>50men, >67 women);diabetes mellitus;dyslipidemia;hypertension;male gender     Objective:    Today's Vitals   04/11/24 0925  BP: 108/60  Pulse: 62  Temp: 98.2 F (36.8 C)  SpO2: 97%  Weight: 169 lb (76.7 kg)  Height: 5' 8 (1.727 m)   Body mass index is 25.7 kg/m.     04/11/2024    9:33 AM 04/06/2023    9:51 AM 01/05/2023    8:44 AM 12/26/2022    8:49 AM 10/13/2022   11:58 AM 06/06/2022    1:24 PM 04/14/2022    9:20 AM  Advanced Directives  Does Patient Have a Medical Advance Directive? Yes Yes Yes Yes Yes Yes Yes  Type of Estate agent of [place];Living will Healthcare Power of [place];Living will Healthcare Power of [place];Living will Healthcare Power of [place];Living will Healthcare Power of [employer] of [place];Living will  Does patient want to make changes to medical advance directive? No - Patient declined No - Patient declined No - Patient declined   No - Patient declined   Copy of Healthcare Power of Attorney in Chart? Yes - validated most recent copy scanned in chart (See row information) Yes - validated most recent copy scanned in chart (See row information) No - copy requested No - copy requested   No - copy requested    Current Medications (verified) Outpatient Encounter  Medications as of 04/11/2024  Medication Sig   Accu-Chek Softclix Lancets lancets 1 each by Other route 3 (three) times daily.   atorvastatin  (LIPITOR ) 80 MG tablet TAKE 1 TABLET DAILY AT     6:00PM   Blood Glucose Monitoring Suppl (ACCU-CHEK AVIVA PLUS) w/Device KIT Check glucose TID or as needed E11.9   carvedilol  (COREG ) 3.125 MG tablet TAKE 1 TABLET TWICE DAILY  WITH MEALS   clopidogrel  (PLAVIX ) 75 MG tablet Take 1 tablet (75 mg total) by mouth daily.   Coenzyme Q10 (COQ10) 100 MG CAPS Take 200 mg by mouth daily.   empagliflozin  (JARDIANCE ) 10 MG TABS tablet Take 1 tablet (10 mg total) by mouth daily before breakfast.   ferrous sulfate  325 (65 FE) MG tablet Take 325 mg by mouth daily.   glucose blood test strip Use as instructed   hydrochlorothiazide  (HYDRODIURIL ) 25 MG tablet Take 1 tablet (25 mg total) by mouth daily.   irbesartan  (AVAPRO ) 300 MG tablet Take 1 tablet (300 mg total) by mouth daily.   metFORMIN  (GLUCOPHAGE -XR) 500 MG 24 hr tablet TAKE 2 TABLETS DAILY AFTER SUPPER   Multiple Vitamins-Minerals (MULTIVITAMIN WITH MINERALS) tablet Take 1 tablet by mouth daily.   nitroGLYCERIN  (NITROSTAT ) 0.4 MG SL tablet Place 1 tablet (0.4 mg total) under the tongue every 5 (five) minutes as needed for chest pain.   Propylene Glycol, PF, (SYSTANE COMPLETE PF) 0.6 % SOLN Place 1 drop into both eyes daily as needed (dry eyes).  sildenafil  (REVATIO ) 20 MG tablet Take 1-5 tablets by mouth daily as needed.   Undecylenic Ac-Zn Undecylenate (FUNGI-NAIL TOE & FOOT EX) Apply 1 application  topically daily as needed (fungus).   vitamin B-12 (CYANOCOBALAMIN ) 1000 MCG tablet Take 1,000 mcg by mouth daily.   Whey Protein POWD Take 2 Scoops by mouth daily.   No facility-administered encounter medications on file as of 04/11/2024.    Allergies (verified) Aspirin, Oxycodone , Contrast media [iodinated contrast media], and Norvasc  [amlodipine  besylate]   History: Past Medical History:  Diagnosis Date    Adenomatous colon polyp 09/07/2004   Amblyopia    Anemia    Arthritis    CAD (coronary artery disease)    cardiac CT with a 50% LAD lesion in 2007   Diverticulosis    DM2 (diabetes mellitus, type 2) (HCC)    Elevated LFTs    secondary to labetalol   Esophageal stricture 2003   External hemorrhoids 2012   lanced by Dr Lorriane   Family history of anesthesia complication    sister had PONV   GERD (gastroesophageal reflux disease)    esophageal stricture, pmh   Hiatal hernia 01/04/2000   History of kidney stones 2022   HLD (hyperlipidemia)    HTN (hypertension)    Hypopotassemia    Past Surgical History:  Procedure Laterality Date   BIOPSY  06/05/2021   Procedure: BIOPSY;  Surgeon: Avram Lupita BRAVO, MD;  Location: [facility];  Service: Endoscopy;;   BIOPSY  09/02/2021   Procedure: BIOPSY;  Surgeon: Wilhelmenia Aloha Raddle., MD;  Location: [facility] ENDOSCOPY;  Service: Gastroenterology;;   COLONOSCOPY  06/27/2004   tics , ADENOMATOUS polyps. F/U due 2011   COLONOSCOPY  06/27/2010   Tics   COLONOSCOPY WITH PROPOFOL  N/A 09/02/2021   Procedure: COLONOSCOPY WITH PROPOFOL ;  Surgeon: Wilhelmenia Aloha Raddle., MD;  Location: [facility] ENDOSCOPY;  Service: Gastroenterology;  Laterality: N/A;   COLONOSCOPY WITH PROPOFOL  N/A 04/14/2022   Procedure: COLONOSCOPY WITH PROPOFOL ;  Surgeon: Mansouraty, Aloha Raddle., MD;  Location: [facility];  Service: Gastroenterology;  Laterality: N/A;   COLONOSCOPY WITH PROPOFOL  N/A 10/13/2022   Procedure: COLONOSCOPY WITH PROPOFOL ;  Surgeon: Mansouraty, Aloha Raddle., MD;  Location: [facility];  Service: Gastroenterology;  Laterality: N/A;   CORONARY ANGIOPLASTY WITH STENT PLACEMENT  04/01/2014   2   ENDOSCOPIC MUCOSAL RESECTION  09/02/2021   Procedure: ENDOSCOPIC MUCOSAL RESECTION;  Surgeon: Wilhelmenia Aloha Raddle., MD;  Location[facility]COPY;  Service: Gastroenterology;;   ENDOSCOPIC MUCOSAL RESECTION N/A 04/14/2022   Procedure: ENDOSCOPIC MUCOSAL  RESECTION;  Surgeon: Wilhelmenia Aloha Raddle., MD;  Location: [facility];  Service: Gastroenterology;  Laterality: N/A;   ESOPHAGEAL BRUSHING  04/14/2022   Procedure: ESOPHAGEAL BRUSHING;  Surgeon: Wilhelmenia Aloha Raddle., MD;  Location: [facility];  Service: Gastroenterology;;   ESOPHAGEAL DILATION  06/27/2001   ESOPHAGOGASTRODUODENOSCOPY  06/27/2001   ERD, stricture dilated   ESOPHAGOGASTRODUODENOSCOPY (EGD) WITH PROPOFOL  N/A 06/05/2021   Procedure: ESOPHAGOGASTRODUODENOSCOPY (EGD) WITH PROPOFOL ;  Surgeon: Avram Lupita BRAVO, MD;  [facility];  Service: Endoscopy;  Laterality: N/A;   ESOPHAGOGASTRODUODENOSCOPY (EGD) WITH PROPOFOL  N/A 09/02/2021   Procedure: ESOPHAGOGASTRODUODENOSCOPY (EGD) WITH PROPOFOL ;  Surgeon: Wilhelmenia Aloha Raddle., MD;  Location: [facility] ENDOSCOPY;  Service: Gastroenterology;  Laterality: N/A;   ESOPHAGOGASTRODUODENOSCOPY (EGD) WITH PROPOFOL  N/A 04/14/2022   Procedure: ESOPHAGOGASTRODUODENOSCOPY (EGD) WITH PROPOFOL ;  Surgeon: Wilhelmenia Aloha Raddle., MD;  [facility];  Service: Gastroenterology;  Laterality: N/A;   EXCISIONAL HEMORRHOIDECTOMY  ~ 2008   FULL THICKNESS RESECTION (FTRD) N/A 10/13/2022  Procedure: FULL THICKNESS RESECTION (FTRD);  Surgeon: Wilhelmenia Aloha Raddle., MD;  Location: [facility];  Service: Gastroenterology;  Laterality: N/A;   HEMOSTASIS CLIP PLACEMENT  09/02/2021   Procedure: HEMOSTASIS CLIP PLACEMENT;  Surgeon: Wilhelmenia Aloha Raddle., MD;  Location: [facility] ENDOSCOPY;  Service: Gastroenterology;;   HEMOSTASIS CLIP PLACEMENT  10/13/2022   Procedure: HEMOSTASIS CLIP PLACEMENT;  Surgeon: Wilhelmenia Aloha Raddle., MD;  Location: [facility];  Service: Gastroenterology;;   HEMOSTASIS CONTROL  04/14/2022   Procedure: HEMOSTASIS CONTROL;  Surgeon: Wilhelmenia Aloha Raddle., MD;  Location: [facility];  Service: Gastroenterology;;   HEMOSTASIS CONTROL  10/13/2022   Procedure: HEMOSTASIS CONTROL;  Surgeon: Wilhelmenia Aloha Raddle., MD;   [facility];  Service: Gastroenterology;;   HOT HEMOSTASIS N/A 09/02/2021   Procedure: HOT HEMOSTASIS (ARGON PLASMA COAGULATION/BICAP);  Surgeon: Wilhelmenia Aloha Raddle., MD;  Location: [facility] ENDOSCOPY;  Service: Gastroenterology;  Laterality: N/A;   HOT HEMOSTASIS N/A 04/14/2022   Procedure: HOT HEMOSTASIS (ARGON PLASMA COAGULATION/BICAP);  Surgeon: Wilhelmenia Aloha Raddle., MD;  Location: [facility];  Service: Gastroenterology;  Laterality: N/A;   LEFT HEART CATHETERIZATION WITH CORONARY ANGIOGRAM N/A 03/26/2014   Procedure: LEFT HEART CATHETERIZATION WITH CORONARY ANGIOGRAM;  Surgeon: Peter M Swaziland, MD;  Location: [facility] CATH LAB;  Service: Cardiovascular;  Laterality: N/A;   PARTIAL COLECTOMY  01/05/2023   R.  polyp-large   PERCUTANEOUS CORONARY STENT INTERVENTION (PCI-S) N/A 04/01/2014   Procedure: PERCUTANEOUS CORONARY STENT INTERVENTION (PCI-S);  Surgeon: Peter M Swaziland, MD;  Location: [facility] CATH LAB;  Service: Cardiovascular;  Laterality: N/A;   POLYPECTOMY  10/13/2022   Procedure: POLYPECTOMY;  Surgeon: Wilhelmenia Aloha Raddle., MD;  Location: [facility];  Service: Gastroenterology;;   POSTERIOR LAMINECTOMY / DECOMPRESSION LUMBAR SPINE  12/25/2009   L4 , Dr Alix   SCLEROTHERAPY  10/13/2022   Procedure: SCLEROTHERAPY;  Surgeon: Wilhelmenia Aloha Raddle., MD;  Location: [facility];  Service: Gastroenterology;;   SHOULDER ARTHROSCOPY WITH ROTATOR CUFF REPAIR AND OPEN BICEPS TENODESIS Left 02/04/2019   Procedure: LEFT SHOULDER ARTHROSCOPY, BICEPS TENODESIS, MINI OPEN ROTATOR CUFF TEAR REPAIR;  Surgeon: Addie Cordella Hamilton, MD;  Location: [facility];  Service: Orthopedics;  Laterality: Left;   SUBMUCOSAL INJECTION  09/02/2021   Procedure: SUBMUCOSAL INJECTION;  Surgeon: Wilhelmenia Aloha Raddle., MD;  Location: [facility] ENDOSCOPY;  Service: Gastroenterology;;   SUBMUCOSAL LIFTING INJECTION  04/14/2022   Procedure: SUBMUCOSAL LIFTING INJECTION;  Surgeon: Wilhelmenia Aloha Raddle., MD;  Location: [facility];  Service: Gastroenterology;;   TOTAL KNEE ARTHROPLASTY Right 05/11/2021   Procedure: TOTAL KNEE ARTHROPLASTY;  Surgeon: Ernie Cough, MD;  Location: [facility];  Service: Orthopedics;  Laterality: Right;   Family History  Problem Relation Age of Onset   Heart attack Mother 30   Stroke Mother 22   Prostate cancer Father    Diabetes Father    Heart disease Father 75       CBAG    Heart disease Sister    Diabetes Brother    Heart attack Brother    Breast cancer Maternal Aunt    Heart attack Maternal Grandfather 27   Diabetes Paternal Grandmother    Stroke Paternal Grandfather 65   Stomach cancer Neg Hx    Rectal cancer Neg Hx    Esophageal cancer Neg Hx    Colon cancer Neg Hx    Inflammatory bowel disease Neg Hx    Liver disease Neg Hx    Pancreatic cancer Neg Hx    Social History   Socioeconomic History   Marital status: Single  Spouse name: Not on file   Number of children: 0   Years of education: 16   Highest education level: Bachelor's degree (e.g., BA, AB, BS)  Occupational History   Occupation: Retired   Tobacco Use   Smoking status: Never   Smokeless tobacco: Never  Vaping Use   Vaping status: Never Used  Substance and Sexual Activity   Alcohol  use: Yes    Comment: social   Drug use: No   Sexual activity: Not Currently  Other Topics Concern   Not on file  Social History Narrative   Fun: Water quality scientist.    Social Drivers of Corporate investment banker Strain: Low Risk  (04/11/2024)   Overall Financial Resource Strain (CARDIA)    Difficulty of Paying Living Expenses: Not hard at all  Food Insecurity: No Food Insecurity (04/11/2024)   Hunger Vital Sign    Worried About Running Out of Food in the Last Year: Never true    Ran Out of Food in the Last Year: Never true  Transportation Needs: No Transportation Needs (04/11/2024)   PRAPARE - Administrator, Civil Service (Medical): No    Lack of Transportation  (Non-Medical): No  Physical Activity: Insufficiently Active (04/11/2024)   Exercise Vital Sign    Days of Exercise per Week: 4 days    Minutes of Exercise per Session: 30 min  Stress: No Stress Concern Present (04/11/2024)   [employer] of Occupational Health - Occupational Stress Questionnaire    Feeling of Stress: Not at all  Social Connections: Socially Isolated (04/11/2024)   Social Connection and Isolation Panel    Frequency of Communication with Friends and Family: More than three times a week    Frequency of Social Gatherings with Friends and Family: More than three times a week    Attends Religious Services: Never    Database administrator or Organizations: No    Attends Engineer, structural: Never    Marital Status: Never married    Tobacco Counseling Counseling given: Not Answered    Clinical Intake:  Pre-visit preparation completed: Yes  Pain : No/denies pain     BMI - recorded: 25.7 Nutritional Status: BMI 25 -29 Overweight Diabetes: Yes CBG done?: No Did pt. bring in CBG monitor from home?: No  Lab Results  Component Value Date   HGBA1C 7.3 (A) 02/07/2024   HGBA1C 7.3 (A) 11/15/2023   HGBA1C 10.0 (H) 08/09/2023     How often do you need to have someone help you when you read instructions, pamphlets, or other written materials from your doctor or pharmacy?: 1 - Never  Interpreter Needed?: No  Information entered by :: Ellouise Haws, LPN   Activities of Daily Living     04/07/2024   11:01 AM  In your present state of health, do you have any difficulty performing the following activities:  Hearing? 0  Vision? 0  Difficulty concentrating or making decisions? 0  Walking or climbing stairs? 0  Dressing or bathing? 0  Doing errands, shopping? 0  Preparing Food and eating ? N  Using the Toilet? N  In the past six months, have you accidently leaked urine? N  Do you have problems with loss of bowel control? N  Managing your  Medications? N  Managing your Finances? N  Housekeeping or managing your Housekeeping? N    Patient Care Team: Kennyth Worth HERO, MD as PCP - General (Family Medicine) Swaz[place], Peter M, MD as PCP -  Cardiology (Cardiology) Manford Elspeth ORN, MD as Consulting Physician (Optometry) Addie, Cordella Hamilton, MD as Consulting Physician (Orthopedic Surgery) [facility] as Consulting Physician Nicholaus Sherlean CROME, [facility] (Inactive) (Pharmacist) Sheldon Elspeth, MD as Consulting Physician (General Surgery) Mansouraty, Aloha Raddle., MD as Consulting Physician (Gastroenterology) [place]az[place], Peter M, MD as Consulting Physician (Cardiology)  I have updated your Care Teams any recent Medical Services you may have received from other providers in the past year.     Assessment:   This is a routine wellness examination for Azahel.  Hearing/Vision screen Hearing Screening - Comments:: Pt denies any hearing issues  Vision Screening - Comments:: Wears rx glasses - up to date with routine eye exams with Dr Clem Brands    Goals Addressed             This Visit's Progress    Patient Stated       Get more energy and gain more muscle        Depression Screen     04/11/2024    9:35 AM 02/07/2024    8:55 AM 11/15/2023    9:21 AM 08/18/2023    9:44 AM 08/09/2023    9:20 AM 04/06/2023    9:52 AM 02/06/2023    9:44 AM  PHQ 2/9 Scores  PHQ - 2 Score 0 0 0 0 0 0 0    Fall Risk     04/07/2024   11:01 AM 02/07/2024    8:55 AM 11/15/2023    9:21 AM 08/18/2023    9:44 AM 08/09/2023    9:20 AM  Fall Risk   Falls in the past year? 0 0 0 0 0  Number falls in past yr: 0 0 0 0 0  Injury with Fall? 0 0 0 0 0  Risk for fall due to : Impaired balance/gait No Fall Risks No Fall Risks No Fall Risks No Fall Risks  Follow up Falls prevention discussed        MEDICARE RISK AT HOME:  Medicare Risk at Home Any stairs in or around the home?: (Patient-Rptd) Yes If so, are there any without  handrails?: (Patient-Rptd) No Home free of loose throw rugs in walkways, pet beds, electrical cords, etc?: (Patient-Rptd) Yes Adequate lighting in your home to reduce risk of falls?: (Patient-Rptd) Yes Life alert?: (Patient-Rptd) No Use of a cane, walker or w/c?: (Patient-Rptd) No Grab bars in the bathroom?: (Patient-Rptd) No Shower chair or bench in shower?: (Patient-Rptd) Yes Elevated toilet seat or a handicapped toilet?: (Patient-Rptd) Yes  TIMED UP AND GO:  Was the test performed?  Yes  Length of time to ambulate 10 feet: 10 sec Gait steady and fast without use of assistive device  Cognitive Function: 6CIT completed        04/11/2024    9:37 AM 04/06/2023    9:54 AM 03/24/2022    8:21 AM 03/18/2021   10:15 AM 03/12/2020    9:48 AM  6CIT Screen  What Year? 0 points 0 points 0 points 0 points 0 points  What month? 0 points 0 points 0 points 0 points 0 points  What time? 0 points 0 points 0 points 0 points   Count back from 20 0 points 0 points 0 points 0 points 0 points  Months in reverse 0 points 0 points 0 points 0 points 0 points  Repeat phrase 0 points 0 points 0 points 0 points 0 points  Total Score 0 points 0 points 0 points 0 points  Immunizations Immunization History  Administered Date(s) Administered    sv, Bivalent, Protein Subunit Rsvpref,pf [place]) 03/28/2022   Fluad Quad(high Dose 65+) 02/21/2019, 04/07/2020, 03/08/2021, 03/15/2022   Influenza,inj,Quad PF,6+ Mos 03/19/2014   Influenza-Unspecified 03/16/2023   PFIZER Comirnaty(Gray Top)Covid-19 Tri-Sucrose Vaccine 03/28/2022   PFIZER(Purple Top)SARS-COV-2 Vaccination 08/01/2019, 08/27/2019, 03/25/2020, 03/12/2021   Pfizer Covid-19 Vaccine Bivalent Booster 40yrs & up 03/16/2023   Pneumococcal Conjugate-13 01/17/2017   Pneumococcal Polysaccharide-23 01/31/2018   Respiratory Syncytial Virus Vaccine,Recomb Aduvanted(Arexvy) 03/28/2022   Smallpox 10/20/1955   Td 12/18/2008   Tdap 04/07/2020   Tetanus  12/13/1963   Vaccinia,smallpox Monkeypox Vaccine Live,pf 10/20/1955   Zoster, Live 12/24/2012    Screening Tests Health Maintenance  Topic Date Due   FOOT EXAM  02/21/2020   COVID-19 Vaccine (7 - 2025-26 season) 02/26/2024   Zoster Vaccines- Shingrix (1 of 2) 05/09/2024 (Originally 01/22/2000)   Influenza Vaccine  09/24/2024 (Originally 01/26/2024)   OPHTHALMOLOGY EXAM  06/07/2024   Diabetic kidney evaluation - eGFR measurement  08/08/2024   Diabetic kidney evaluation - Urine ACR  08/08/2024   HEMOGLOBIN A1C  08/09/2024   Medicare Annual Wellness (AWV)  04/11/2025   Colonoscopy  10/13/2027   DTaP/Tdap/Td (4 - Td or Tdap) 04/07/2030   Pneumococcal Vaccine: 50+ Years  Completed   Hepatitis C Screening  Completed   Meningococcal B Vaccine  Aged Out    Health Maintenance Items Addressed: Diabetic Foot Exam recommended, See Nurse Notes at the end of this note  Additional Screening:  Vision Screening: Recommended annual ophthalmology exams for early detection of glaucoma and other disorders of the eye. Is the patient up to date with their annual eye exam?  Yes  Who is the provider or what is the name of the office in which the patient attends annual eye exams? Dr Clem Brands   Dental Screening: Recommended annual dental exams for proper oral hygiene  Community Resource Referral / Chronic Care Management: CRR required this visit?  No   CCM required this visit?  No   Plan:    I have personally reviewed and noted the following in the patient's chart:   Medical and social history Use of alcohol , tobacco or illicit drugs  Current medications and supplements including opioid prescriptions. Patient is not currently taking opioid prescriptions. Functional ability and status Nutritional status Physical activity Advanced directives List of other physicians Hospitalizations, surgeries, and ER visits in previous 12 months Vitals Screenings to include cognitive, depression, and  falls Referrals and appointments  In addition, I have reviewed and discussed with patient certain preventive protocols, quality metrics, and best practice recommendations. A written personalized care plan for preventive services as well as general preventive health recommendations were provided to patient.   Ellouise VEAR Haws, LPN   [id number]   After Visit Summary: (In Person-Printed) AVS printed and given to the patient  Notes: Nothing significant to report at this time.

## 2024-04-11 NOTE — Patient Instructions (Signed)
 [name] [name],  Thank you for taking the time for your Medicare Wellness Visit. I appreciate your continued commitment to your health goals. Please review the care plan we discussed, and feel free to reach out if I can assist you further.  Medicare recommends these wellness visits once per year to help you and your care team stay ahead of potential health issues. These visits are designed to focus on prevention, allowing your provider to concentrate on managing your acute and chronic conditions during your regular appointments.  Please note that Annual Wellness Visits do not include a physical exam. Some assessments may be limited, especially if the visit was conducted virtually. If needed, we may recommend a separate in-person follow-up with your provider.  Ongoing Care Seeing your primary care provider every 3 to 6 months helps us  monitor your health and provide consistent, personalized care.   Referrals If a referral was made during today's visit and you haven't received any updates within two weeks, please contact the referred provider directly to check on the status.  Recommended Screenings:  Health Maintenance  Topic Date Due   Complete foot exam   02/21/2020   COVID-19 Vaccine (7 - 2025-26 season) 02/26/2024   Medicare Annual Wellness Visit  04/05/2024   Zoster (Shingles) Vaccine (1 of 2) 05/09/2024*   Flu Shot  09/24/2024*   Eye exam for diabetics  06/07/2024   Yearly kidney function blood test for diabetes  08/08/2024   Yearly kidney health urinalysis for diabetes  08/08/2024   Hemoglobin A1C  08/09/2024   Colon Cancer Screening  10/13/2027   DTaP/Tdap/Td vaccine (4 - Td or Tdap) 04/07/2030   Pneumococcal Vaccine for age over 31  Completed   Hepatitis C Screening  Completed   Meningitis B Vaccine  Aged Out  *Topic was postponed. The date shown is not the original due date.       04/06/2023    9:51 AM  Advanced Directives  Does Patient Have a Medical Advance Directive? Yes   Type of Estate agent of [place];Living will  Does patient want to make changes to medical advance directive? No - Patient declined  Copy of Healthcare Power of Attorney in Chart? Yes - validated most recent copy scanned in chart (See row information)   Advance Care Planning is important because it: Ensures you receive medical care that aligns with your values, goals, and preferences. Provides guidance to your family and loved ones, reducing the emotional burden of decision-making during critical moments.  Vision: Annual vision screenings are recommended for early detection of glaucoma, cataracts, and diabetic retinopathy. These exams can also reveal signs of chronic conditions such as diabetes and high blood pressure.  Dental: Annual dental screenings help detect early signs of oral cancer, gum disease, and other conditions linked to overall health, including heart disease and diabetes.  Please see the attached documents for additional preventive care recommendations.

## 2024-05-09 ENCOUNTER — Ambulatory Visit (INDEPENDENT_AMBULATORY_CARE_PROVIDER_SITE_OTHER): Admitting: Family Medicine

## 2024-05-09 ENCOUNTER — Encounter: Payer: Self-pay | Admitting: Family Medicine

## 2024-05-09 VITALS — BP 102/60 | HR 61 | Temp 97.2°F | Ht 68.0 in | Wt 171.0 lb

## 2024-05-09 DIAGNOSIS — J358 Other chronic diseases of tonsils and adenoids: Secondary | ICD-10-CM

## 2024-05-09 DIAGNOSIS — Z7984 Long term (current) use of oral hypoglycemic drugs: Secondary | ICD-10-CM

## 2024-05-09 DIAGNOSIS — I152 Hypertension secondary to endocrine disorders: Secondary | ICD-10-CM | POA: Diagnosis not present

## 2024-05-09 DIAGNOSIS — E1165 Type 2 diabetes mellitus with hyperglycemia: Secondary | ICD-10-CM

## 2024-05-09 DIAGNOSIS — E1159 Type 2 diabetes mellitus with other circulatory complications: Secondary | ICD-10-CM

## 2024-05-09 LAB — POCT GLYCOSYLATED HEMOGLOBIN (HGB A1C): Hemoglobin A1C: 7.4 % — AB (ref 4.0–5.6)

## 2024-05-09 NOTE — Progress Notes (Signed)
   [name] [name] is a 73 y.o. male who presents today for an office visit.  Assessment/Plan:  New/Acute Problems: Tonsillolith No red flags though still having intermittent irritation from this.  Will refer to ENT per patient request.  Chronic Problems Addressed Today: No problem-specific Assessment & Plan notes found for this encounter.     Subjective:  HPI:  See assessment / plan for status of chronic conditions.   Discussed the use of AI scribe software for clinical note transcription with the patient, who gave verbal consent to proceed.  History of Present Illness [name] [name] is a 73 year old male with diabetes who presents for a routine follow-up visit.  He has a history of diabetes with a recent A1c of 7.3. He is considering changes to his health insurance plan, switching to a [employer] Medicare Advantage plan due to lower premiums. He uses Jardiance  for diabetes management, previously obtained from [place] due to cost concerns, and is evaluating whether the new insurance plan will cover this medication at a reasonable cost. Most of his medications are covered with zero copay under the new plan, but Jardiance  may have a higher cost.  He experiences frequent thirst and urination, which he attributes to his current use of diuretics, including hydrochlorothiazide  and Jardiance . He is considering discussing with his cardiologist the possibility of discontinuing hydrochlorothiazide  to manage these symptoms better, as his blood pressure has been well-controlled.  He mentions experiencing dry skin and nasal congestion, which he attributes to the dry heat in his home during this time of year. He is considering using a humidifier to alleviate these symptoms.  He is interested in visiting an ENT specialist for ear concerns, as he has heard about various methods for ear cleaning but prefers professional evaluation and management.         Objective:  Physical Exam: BP  102/60   Pulse 61   Temp (!) 97.2 F (36.2 C) (Temporal)   Ht 5' 8 (1.727 m)   Wt 171 lb (77.6 kg)   SpO2 97%   BMI 26.00 kg/m   Gen: No acute distress, resting comfortably CV: Regular rate and rhythm with no murmurs appreciated Pulm: Normal work of breathing, clear to auscultation bilaterally with no crackles, wheezes, or rhonchi Neuro: Grossly normal, moves all extremities Psych: Normal affect and thought content      [name] Roh M. Kennyth, MD 05/09/2024 9:47 AM

## 2024-05-09 NOTE — Assessment & Plan Note (Signed)
 A1c stable 7.4.  Will continue Jardiance  10 mg daily and metformin  1000 mg daily.  He is working on lifestyle inventions.  Recheck again in 3 months.

## 2024-05-09 NOTE — Patient Instructions (Addendum)
 It was very nice to see you today!  VISIT SUMMARY: You had a follow-up visit to discuss your diabetes management, medication costs, and some other health concerns. Your diabetes is well-controlled, and your blood pressure is stable. We also talked about your dry skin, nasal congestion, and ear concerns.  YOUR PLAN: TYPE 2 DIABETES MELLITUS WITH HYPERTENSION: Your diabetes is well-controlled with an A1c of 7.4, and your blood pressure is stable. -Continue taking Jardiance  10 mg orally daily and Metformin  XR 1000 mg orally daily. -Consider discussing with your cardiologist about discontinuing hydrochlorothiazide  due to overlapping effects with Jardiance  and potential side effects. -Monitor your blood pressure and adjust medications as needed. -Evaluate the cost savings with the new Medicare Advantage plan and ensure it covers your medications at a reasonable cost.  TONSILLOLITH: You have concerns about ear cleaning and tonsil stones. -Avoid self-removal of tonsil stones due to risks of impaction and infection. -You have been referred to an ENT specialist for evaluation and management.  DRY SKIN AND NASAL CONGESTION: You are experiencing dry skin and nasal congestion, likely due to the dry heat in your home. -Consider using a humidifier to alleviate these symptoms.  Return in about 3 months (around 08/09/2024) for Follow Up, Annual Physical.   Take care, Dr Kennyth  PLEASE NOTE:  If you had any lab tests, please let us  know if you have not heard back within a few days. You may see your results on mychart before we have a chance to review them but we will give you a call once they are reviewed by us .   If we ordered any referrals today, please let us  know if you have not heard from their office within the next week.   If you had any urgent prescriptions sent in today, please check with the pharmacy within an hour of our visit to make sure the prescription was transmitted appropriately.    Please try these tips to maintain a healthy lifestyle:  Eat at least 3 REAL meals and 1-2 snacks per day.  Aim for no more than 5 hours between eating.  If you eat breakfast, please do so within one hour of getting up.   Each meal should contain half fruits/vegetables, one quarter protein, and one quarter carbs (no bigger than a computer mouse)  Cut down on sweet beverages. This includes juice, soda, and sweet tea.   Drink at least 1 glass of water with each meal and aim for at least 8 glasses per day  Exercise at least 150 minutes every week.

## 2024-05-09 NOTE — Assessment & Plan Note (Signed)
 At goal today on Coreg  3.125 mg twice daily, HCTZ 25 mg daily, and irbesartan  300 mg daily.  He is interested in decreasing HCTZ and I will discuss this with his cardiologist.

## 2024-05-20 ENCOUNTER — Encounter: Payer: Self-pay | Admitting: Family Medicine

## 2024-06-13 ENCOUNTER — Encounter (INDEPENDENT_AMBULATORY_CARE_PROVIDER_SITE_OTHER): Payer: Self-pay | Admitting: Otolaryngology

## 2024-06-13 ENCOUNTER — Ambulatory Visit (INDEPENDENT_AMBULATORY_CARE_PROVIDER_SITE_OTHER): Admitting: Otolaryngology

## 2024-06-13 VITALS — BP 118/78 | HR 68 | Ht 68.0 in | Wt 170.0 lb

## 2024-06-13 DIAGNOSIS — H608X3 Other otitis externa, bilateral: Secondary | ICD-10-CM

## 2024-06-13 DIAGNOSIS — J358 Other chronic diseases of tonsils and adenoids: Secondary | ICD-10-CM | POA: Diagnosis not present

## 2024-06-13 NOTE — Progress Notes (Signed)
 Dear Dr. Kennyth, Here is my assessment for our mutual patient, [name] [name]. Thank you for allowing me the opportunity to care for your patient. Please do not hesitate to contact me should you have any other questions. Sincerely, Dr. Eldora Blanch  Otolaryngology Clinic Note Referring provider: Dr. Kennyth HPI:  [name] is a 73 y.o. male kindly referred by Dr. Kennyth for evaluation of tonsil stone and ear concerns  Initial visit (05/2024): He reports that he has had a tonsil stone on the left side, incidentally noted when he was brushing his teeth. Few months, no change. No pain or problems with it. First time he's noticed it. No issues with tonsils otherwise including infections. No B symptoms. Never smoker.  Patient otherwise denies: - dysphagia, odynophagia, unintentional weight loss - changes in voice, shortness of breath, hemoptysis tobacco or significant alcohol  history - ear pain, neck masses  Only complaint is that every once in a while he does have some right ear itching. He wishes to check for wax Patient denies: ear pain, fullness, vertigo, drainage, hearing change Patient additionally denies: deep pain in ear canal, eustachian tube symptoms such as popping, crackling, sensitive to pressure changes Patient also denies barotrauma, vestibular suppressant use, ototoxic medication use Prior ear surgery: no No hearing concerns.   ENT Surgery: Cauliflower ear procedure but otherwise none Personal or FHx of bleeding dz or anesthesia difficulty: no  AP/AC: Plavix   Tobacco: never  PMHx: DM, HTN, HLD, CAD s/p stenting, GERD, GI bleed   Independent Review of Additional Tests or Records:  Dr. Kennyth (05/09/2024): dry skin, nasal congestion; ear concerns, worried about wax(?); Tonsillolith as well; Dx: Tonsillolith, ear concerns; Rx: ref to ENT Labs CBC and CMP (08/09/2023): WBC 9.6, BUN/Cr 17/0.99  PMH/Meds/All/SocHx/FamHx/ROS:   Past Medical History:  Diagnosis Date    Adenomatous colon polyp 09/07/2004   Amblyopia    Anemia    Arthritis    CAD (coronary artery disease)    cardiac CT with a 50% LAD lesion in 2007   Diverticulosis    DM2 (diabetes mellitus, type 2) (HCC)    Elevated LFTs    secondary to labetalol   Esophageal stricture 2003   External hemorrhoids 2012   lanced by Dr Lorriane   Family history of anesthesia complication    sister had PONV   GERD (gastroesophageal reflux disease)    esophageal stricture, pmh   Hiatal hernia 01/04/2000   History of kidney stones 2022   HLD (hyperlipidemia)    HTN (hypertension)    Hypopotassemia      Past Surgical History:  Procedure Laterality Date   BIOPSY  06/05/2021   Procedure: BIOPSY;  Surgeon: Avram Lupita BRAVO, MD;  Location: [facility] ENDOSCOPY;  Service: Endoscopy;;   BIOPSY  09/02/2021   Procedure: BIOPSY;  Surgeon: Wilhelmenia Aloha Raddle., MD;  Location: [facility] ENDOSCOPY;  Service: Gastroenterology;;   COLONOSCOPY  06/27/2004   tics , ADENOMATOUS polyps. F/U due 2011   COLONOSCOPY  06/27/2010   Tics   COLONOSCOPY WITH PROPOFOL  N/A 09/02/2021   Procedure: COLONOSCOPY WITH PROPOFOL ;  Surgeon: Mansouraty, Aloha Raddle., MD;  Location: [facility] ENDOSCOPY;  Service: Gastroenterology;  Laterality: N/A;   COLONOSCOPY WITH PROPOFOL  N/A 04/14/2022   Procedure: COLONOSCOPY WITH PROPOFOL ;  Surgeon: Mansouraty, Aloha Raddle., MD;  Location: [facility] ENDOSCOPY;  Service: Gastroenterology;  Laterality: N/A;   COLONOSCOPY WITH PROPOFOL  N/A 10/13/2022   Procedure: COLONOSCOPY WITH PROPOFOL ;  Surgeon: Wilhelmenia Aloha Raddle., MD;  Location: [facility] ENDOS[facility];  Service: Gastroenterology;  Laterality: N/A;  CORONARY ANGIOPLASTY WITH STENT PLACEMENT  04/01/2014   2   ENDOSCOPIC MUCOSAL RESECTION  09/02/2021   Procedure: ENDOSCOPIC MUCOSAL RESECTION;  Surgeon: Wilhelmenia Aloha Raddle., MD;  Location: [name] ENDOSCOPY;  Service: Gastroenterology;;   ENDOSCOPIC MUCOSAL RESECTION N/A 04/14/2022   Procedure: ENDOSCOPIC MUCOSAL  RESECTION;  Surgeon: Wilhelmenia Aloha Raddle., MD;  Location: [facility];  Service: Gastroenterology;  Laterality: N/A;   ESOPHAGEAL BRUSHING  04/14/2022   Procedure: ESOPHAGEAL BRUSHING;  Surgeon: Wilhelmenia Aloha Raddle., MD;  Location: [facility] ENDOSCOPY;  Service: Gastroenterology;;   ESOPHAGEAL DILATION  06/27/2001   ESOPHAGOGASTRODUODENOSCOPY  06/27/2001   ERD, stricture dilated   ESOPHAGOGASTRODUODENOSCOPY (EGD) WITH PROPOFOL  N/A 06/05/2021   Procedure: ESOPHAGOGASTRODUODENOSCOPY (EGD) WITH PROPOFOL ;  Surgeon: Avram Lupita BRAVO, MD;  Location: [facility];  Service: Endoscopy;  Laterality: N/A;   ESOPHAGOGASTRODUODENOSCOPY (EGD) WITH PROPOFOL  N/A 09/02/2021   Procedure: ESOPHAGOGASTRODUODENOSCOPY (EGD) WITH PROPOFOL ;  Surgeon: Wilhelmenia Aloha Raddle., MD;  Location: [facility] ENDOSCOPY;  Service: Gastroenterology;  Laterality: N/A;   ESOPHAGOGASTRODUODENOSCOPY (EGD) WITH PROPOFOL  N/A 04/14/2022   Procedure: ESOPHAGOGASTRODUODENOSCOPY (EGD) WITH PROPOFOL ;  Surgeon: Wilhelmenia Aloha Raddle., MD;  Location: [facility] ENDOS[facility];  Service: Gastroenterology;  Laterality: N/A;   EXCISIONAL HEMORRHOIDECTOMY  ~ 2008   FULL THICKNESS RESECTION (FTRD) N/A 10/13/2022   Procedure: FULL THICKNESS RESECTION (FTRD);  Surgeon: Wilhelmenia Aloha Raddle., MD;  Location: [facility];  Service: Gastroenterology;  Laterality: N/A;   HEMOSTASIS CLIP PLACEMENT  09/02/2021   Procedure: HEMOSTASIS CLIP PLACEMENT;  Surgeon: Wilhelmenia Aloha Raddle., MD;  Location: [facility] ENDOSCOPY;  Service: Gastroenterology;;   HEMOSTASIS CLIP PLACEMENT  10/13/2022   Procedure: HEMOSTASIS CLIP PLACEMENT;  Surgeon: Wilhelmenia Aloha Raddle., MD;  Location: [facility];  Service: Gastroenterology;;   HEMOSTASIS CONTROL  04/14/2022   Procedure: HEMOSTASIS CONTROL;  Surgeon: Wilhelmenia Aloha Raddle., MD;  Location: [facility];  Service: Gastroenterology;;   HEMOSTASIS CONTROL  10/13/2022   Procedure: HEMOSTASIS CONTROL;  Surgeon: Wilhelmenia Aloha Raddle., MD;   Location: [facility] ENDOSCOPY;  Service: Gastroenterology;;   HOT HEMOSTASIS N/A 09/02/2021   Procedure: HOT HEMOSTASIS (ARGON PLASMA COAGULATION/BICAP);  Surgeon: Wilhelmenia Aloha Raddle., MD;  Location: [facility] ENDOSCOPY;  Service: Gastroenterology;  Laterality: N/A;   HOT HEMOSTASIS N/A 04/14/2022   Procedure: HOT HEMOSTASIS (ARGON PLASMA COAGULATION/BICAP);  Surgeon: Wilhelmenia Aloha Raddle., MD;  Location: [facility];  Service: Gastroenterology;  Laterality: N/A;   LEFT HEART CATHETERIZATION WITH CORONARY ANGIOGRAM N/A 03/26/2014   Procedure: LEFT HEART CATHETERIZATION WITH CORONARY ANGIOGRAM;  Surgeon: Peter M Jordan, MD;  Location[facility] CATH LAB;  Service: Cardiovascular;  Laterality: N/A;   PARTIAL COLECTOMY  01/05/2023   R.  polyp-large   PERCUTANEOUS CORONARY STENT INTERVENTION (PCI-S) N/A 04/01/2014   Procedure: PERCUTANEOUS CORONARY STENT INTERVENTION (PCI-S);  Surgeon: Peter M Jordan, MD;  Location: [facility] CATH LAB;  Service: Cardiovascular;  Laterality: N/A;   POLYPECTOMY  10/13/2022   Procedure: POLYPECTOMY;  Surgeon: Wilhelmenia Aloha Raddle., MD;  Location: [facility] ENDOSCOPY;  Service: Gastroenterology;;   POSTERIOR LAMINECTOMY / DECOMPRESSION LUMBAR SPINE  12/25/2009   L4 , Dr Alix   SCLEROTHERAPY  10/13/2022   Procedure: SCLEROTHERAPY;  Surgeon: Wilhelmenia Aloha Raddle., MD;  Location: [facility];  Service: Gastroenterology;;   SHOULDER ARTHROSCOPY WITH ROTATOR CUFF REPAIR AND OPEN BICEPS TENODESIS Left 02/04/2019   Procedure: LEFT SHOULDER ARTHROSCOPY, BICEPS TENODESIS, MINI OPEN ROTATOR CUFF TEAR REPAIR;  Surgeon: Addie Cordella Hamilton, MD;  Location: [facility];  Service: Orthopedics;  Laterality: Left;   SUBMUCOSAL INJECTION  09/02/2021   Procedure: SUBMUCOSAL INJECTION;  Surgeon: Wilhelmenia Aloha  Mickey., MD;  Location: [facility] ENDOSCOPY;  Service: Gastroenterology;;   SUBMUCOSAL LIFTING INJECTION  04/14/2022   Procedure: SUBMUCOSAL LIFTING INJECTION;  Surgeon: Wilhelmenia Aloha Mickey., MD;  Location: [facility];  Service: Gastroenterology;;   TOTAL KNEE ARTHROPLASTY Right 05/11/2021   Procedure: TOTAL KNEE ARTHROPLASTY;  Surgeon: Ernie Cough, MD;  Location: [facility];  Service: Orthopedics;  Laterality: Right;    Family History  Problem Relation Age of Onset   Heart attack Mother 33   Stroke Mother 39   Prostate cancer Father    Diabetes Father    Heart disease Father 58       CBAG    Heart disease Sister    Diabetes Brother    Heart attack Brother    Breast cancer Maternal Aunt    Heart attack Maternal Grandfather 66   Diabetes Paternal Grandmother    Stroke Paternal Grandfather 22   Stomach cancer Neg Hx    Rectal cancer Neg Hx    Esophageal cancer Neg Hx    Colon cancer Neg Hx    Inflammatory bowel disease Neg Hx    Liver disease Neg Hx    Pancreatic cancer Neg Hx      Social Connections: Socially Isolated (05/05/2024)   Social Connection and Isolation Panel    Frequency of Communication with Friends and Family: More than three times a week    Frequency of Social Gatherings with Friends and Family: More than three times a week    Attends Religious Services: Never    Database Administrator or Organizations: No    Attends Engineer, Structural: Not on file    Marital Status: Never married     Current Medications[1]   Physical Exam:   BP 118/78 (BP Location: Left Arm, Patient Position: Sitting, Cuff Size: Large)   Pulse 68   Ht 5' 8 (1.727 m)   Wt 170 lb (77.1 kg)   SpO2 96%   BMI 25.85 kg/m   Salient findings:  CN II-XII intact Bilateral EAC clear and TM intact with well pneumatized middle ear spaces -- mild eczematoid change both ears Weber 512: mid Rinne 512: AC > BC b/l Anterior rhinoscopy: Septum intact; bilateral inferior turbinates without significant hypertrophy; modestly dry nasal cavity No lesions of oral cavity/oropharynx; dentition fair; small stone left tonsil, removed, otherwise soft to palpation No obviously  palpable neck masses/lymphadenopathy/thyromegaly No respiratory distress or stridor  Seprately Identifiable Procedures:  Prior to initiating any procedures, risks/benefits/alternatives were explained to the patient and verbal consent obtained. None  Impression & Plans:  [name] [name] is a 73 y.o. male with:  1. Tonsillolith   2. Chronic eczematous otitis externa of both ears    Tonsillolith removed -- not having any issues; reported chronic issue; discussed no need for removal if not having problems. No significant halitosis or frequent infections. Recommend good oral care Eczematoid OE: dermotic BID for 1 week if flares  See below regarding exact medications prescribed this encounter including dosages and route: Meds ordered this encounter  Medications   Fluocinolone Acetonide (DERMOTIC) 0.01 % OIL    Sig: Place 4 drops in ear(s) in the morning and at bedtime for 14 days.    Dispense:  20 mL    Refill:  1      Thank you for allowing me the opportunity to care for your patient. Please do not hesitate to contact me should you have any other questions.  Sincerely, Eldora Blanch, MD Otolaryngologist (ENT), [facility]  [facility] Phone: [phone] Fax: [phone]  06/13/2024, 2:57 PM   MDM:  Level 4 - 99204 Complexity/Problems addressed: mod - multiple chronic issues Data complexity: mod - independent review of note, labs - Morbidity: mod  -  Drug prescribed or managed: y      [1]  Current Outpatient Medications:    Accu-Chek Softclix Lancets lancets, 1 each by Other route 3 (three) times daily., Disp: 102 each, Rfl: 11   atorvastatin  (LIPITOR ) 80 MG tablet, TAKE 1 TABLET DAILY AT     6:00PM, Disp: 90 tablet, Rfl: 3   Blood Glucose Monitoring Suppl (ACCU-CHEK AVIVA PLUS) w/Device KIT, Check glucose TID or as needed E11.9, Disp: 1 kit, Rfl: 0   carvedilol  (COREG ) 3.125 MG tablet, TAKE 1 TABLET TWICE DAILY  WITH MEALS, Disp: 180 tablet, Rfl: 3   clopidogrel   (PLAVIX ) 75 MG tablet, Take 1 tablet (75 mg total) by mouth daily., Disp: 90 tablet, Rfl: 3   Coenzyme Q10 (COQ10) 100 MG CAPS, Take 200 mg by mouth daily., Disp: , Rfl:    empagliflozin  (JARDIANCE ) 10 MG TABS tablet, Take 1 tablet (10 mg total) by mouth daily before breakfast., Disp: 90 tablet, Rfl: 1   ferrous sulfate  325 (65 FE) MG tablet, Take 325 mg by mouth daily., Disp: , Rfl:    Fluocinolone Acetonide (DERMOTIC) 0.01 % OIL, Place 4 drops in ear(s) in the morning and at bedtime for 14 days., Disp: 20 mL, Rfl: 1   glucose blood test strip, Use as instructed, Disp: 100 each, Rfl: 12   hydrochlorothiazide  (HYDRODIURIL ) 25 MG tablet, Take 1 tablet (25 mg total) by mouth daily., Disp: 90 tablet, Rfl: 3   irbesartan  (AVAPRO ) 300 MG tablet, Take 1 tablet (300 mg total) by mouth daily., Disp: 90 tablet, Rfl: 3   metFORMIN  (GLUCOPHAGE -XR) 500 MG 24 hr tablet, TAKE 2 TABLETS DAILY AFTER SUPPER, Disp: 180 tablet, Rfl: 3   Multiple Vitamins-Minerals (MULTIVITAMIN WITH MINERALS) tablet, Take 1 tablet by mouth daily., Disp: , Rfl:    nitroGLYCERIN  (NITROSTAT ) 0.4 MG SL tablet, Place 1 tablet (0.4 mg total) under the tongue every 5 (five) minutes as needed for chest pain., Disp: 100 tablet, Rfl: 1   Propylene Glycol, PF, (SYSTANE COMPLETE PF) 0.6 % SOLN, Place 1 drop into both eyes daily as needed (dry eyes)., Disp: , Rfl:    sildenafil  (REVATIO ) 20 MG tablet, Take 1-5 tablets by mouth daily as needed., Disp: 100 tablet, Rfl: 0   Undecylenic Ac-Zn Undecylenate (FUNGI-NAIL TOE & FOOT EX), Apply 1 application  topically daily as needed (fungus)., Disp: , Rfl:    vitamin B-12 (CYANOCOBALAMIN ) 1000 MCG tablet, Take 1,000 mcg by mouth daily., Disp: , Rfl:    Whey Protein POWD, Take 2 Scoops by mouth daily., Disp: , Rfl:

## 2024-06-13 NOTE — Patient Instructions (Addendum)
 When the ear itches, use dermotic drops 4 drops twice per day in that ear for 1 week

## 2024-06-25 ENCOUNTER — Other Ambulatory Visit: Payer: Self-pay | Admitting: Family Medicine

## 2024-06-25 MED ORDER — CLOPIDOGREL BISULFATE 75 MG PO TABS: 75.0000 mg | ORAL_TABLET | Freq: Every day | ORAL | 3 refills | Status: DC

## 2024-06-25 MED ORDER — METFORMIN HCL ER 500 MG PO TB24: ORAL_TABLET | ORAL | 3 refills | Status: DC

## 2024-06-25 MED ORDER — CARVEDILOL 3.125 MG PO TABS: ORAL_TABLET | ORAL | 3 refills | Status: DC

## 2024-06-25 MED ORDER — IRBESARTAN 300 MG PO TABS: 300.0000 mg | ORAL_TABLET | Freq: Every day | ORAL | 3 refills | Status: DC

## 2024-06-25 MED ORDER — EMPAGLIFLOZIN 10 MG PO TABS: 10.0000 mg | ORAL_TABLET | Freq: Every day | ORAL | 1 refills | Status: AC

## 2024-06-25 MED ORDER — ATORVASTATIN CALCIUM 80 MG PO TABS: ORAL_TABLET | ORAL | 3 refills | Status: DC

## 2024-06-25 MED ORDER — HYDROCHLOROTHIAZIDE 25 MG PO TABS: 25.0000 mg | ORAL_TABLET | Freq: Every day | ORAL | 3 refills | Status: DC

## 2024-06-25 NOTE — Telephone Encounter (Signed)
 Copied from CRM #[id number]. Topic: Clinical - Medication Refill >> Jun 25, 2024 11:40 AM Kendralyn S wrote: Medication: empagliflozin  (JARDIANCE ) 10 MG TABS tablet irbesartan  (AVAPRO ) 300 MG tablet clopidogrel  (PLAVIX ) 75 MG tablet hydrochlorothiazide  (HYDRODIURIL ) 25 MG tablet atorvastatin  (LIPITOR ) 80 MG tablet   carvedilol  (COREG ) 3.125 MG tablet metFORMIN  (GLUCOPHAGE -XR) 500 MG 24 hr tablet   Has the patient contacted their pharmacy? Yes (Agent: If no, request that the patient contact the pharmacy for the refill. If patient does not wish to contact the pharmacy document the reason why and proceed with request.) (Agent: If yes, when and what did the pharmacy advise?)  This is the patient's preferred pharmacy:  [email or web address] - [place], [place] - [address] [place] [address] Phone: [phone] Fax: [phone]  Is this the correct pharmacy for this prescription? Yes If no, delete pharmacy and type the correct one.   Has the prescription been filled recently? Yes  Is the patient out of the medication? Yes ,  Jardiance   Has the patient been seen for an appointment in the last year OR does the patient have an upcoming appointment? Yes  Can we respond through MyChart? Yes  Agent: Please be advised that Rx refills may take up to 3 business days. We ask that you follow-up with your pharmacy.

## 2024-07-18 ENCOUNTER — Other Ambulatory Visit: Payer: Self-pay | Admitting: Cardiology

## 2024-07-18 ENCOUNTER — Other Ambulatory Visit: Payer: Self-pay | Admitting: Family Medicine

## 2024-07-24 NOTE — Telephone Encounter (Signed)
 Lipids Completed on 08/09/23

## 2024-07-30 ENCOUNTER — Encounter: Payer: Self-pay | Admitting: Cardiology

## 2024-07-30 ENCOUNTER — Ambulatory Visit: Payer: PRIVATE HEALTH INSURANCE | Admitting: Cardiology

## 2024-07-30 VITALS — BP 100/50 | HR 55 | Resp 16 | Ht 68.0 in | Wt 174.6 lb

## 2024-07-30 DIAGNOSIS — E7800 Pure hypercholesterolemia, unspecified: Secondary | ICD-10-CM | POA: Diagnosis not present

## 2024-07-30 DIAGNOSIS — E119 Type 2 diabetes mellitus without complications: Secondary | ICD-10-CM

## 2024-07-30 DIAGNOSIS — I10 Essential (primary) hypertension: Secondary | ICD-10-CM | POA: Diagnosis not present

## 2024-07-30 DIAGNOSIS — I25118 Atherosclerotic heart disease of native coronary artery with other forms of angina pectoris: Secondary | ICD-10-CM | POA: Diagnosis not present

## 2024-07-30 NOTE — Patient Instructions (Addendum)
 Medication Instructions:  Stop hydrochlorothiazide  Continue all other medications *If you need a refill on your cardiac medications before your next appointment, please call your pharmacy*  Lab Work: None ordered   Testing/Procedures: None ordered  Follow-Up: At [facility], you and your health needs are our priority.  As part of our continuing mission to provide you with exceptional heart care, our providers are all part of one team.  This team includes your primary Cardiologist (physician) and Advanced Practice Providers or APPs (Physician Assistants and Nurse Practitioners) who all work together to provide you with the care you need, when you need it.  Your next appointment:  1 year    Call in Oct to schedule Feb appointment     Provider:  Dr.Jordan   We recommend signing up for the patient portal called MyChart.  Sign up information is provided on this After Visit Summary.  MyChart is used to connect with patients for Virtual Visits (Telemedicine).  Patients are able to view lab/test results, encounter notes, upcoming appointments, etc.  Non-urgent messages can be sent to your provider as well.   To learn more about what you can do with MyChart, go to [email or web address].

## 2024-08-26 ENCOUNTER — Ambulatory Visit: Admitting: Family Medicine

## 2025-04-21 ENCOUNTER — Ambulatory Visit
